# Patient Record
Sex: Male | Born: 1950 | Race: Black or African American | Hispanic: No | Marital: Single | Smoking: Current some day smoker
Health system: Southern US, Community
[De-identification: ages and names within clinical notes are randomized; demographics above are authoritative.]

## PROBLEM LIST (undated history)

## (undated) DIAGNOSIS — I639 Cerebral infarction, unspecified: Secondary | ICD-10-CM

## (undated) DIAGNOSIS — F1020 Alcohol dependence, uncomplicated: Secondary | ICD-10-CM

## (undated) DIAGNOSIS — I739 Peripheral vascular disease, unspecified: Secondary | ICD-10-CM

## (undated) DIAGNOSIS — H269 Unspecified cataract: Secondary | ICD-10-CM

## (undated) DIAGNOSIS — I5032 Chronic diastolic (congestive) heart failure: Secondary | ICD-10-CM

## (undated) DIAGNOSIS — G8191 Hemiplegia, unspecified affecting right dominant side: Secondary | ICD-10-CM

## (undated) DIAGNOSIS — I509 Heart failure, unspecified: Secondary | ICD-10-CM

## (undated) DIAGNOSIS — I6619 Occlusion and stenosis of unspecified anterior cerebral artery: Secondary | ICD-10-CM

## (undated) DIAGNOSIS — R1310 Dysphagia, unspecified: Secondary | ICD-10-CM

## (undated) DIAGNOSIS — F0390 Unspecified dementia without behavioral disturbance: Secondary | ICD-10-CM

## (undated) DIAGNOSIS — N184 Chronic kidney disease, stage 4 (severe): Secondary | ICD-10-CM

## (undated) DIAGNOSIS — F259 Schizoaffective disorder, unspecified: Secondary | ICD-10-CM

## (undated) DIAGNOSIS — E785 Hyperlipidemia, unspecified: Principal | ICD-10-CM

## (undated) DIAGNOSIS — R4189 Other symptoms and signs involving cognitive functions and awareness: Secondary | ICD-10-CM

## (undated) DIAGNOSIS — I6529 Occlusion and stenosis of unspecified carotid artery: Secondary | ICD-10-CM

## (undated) DIAGNOSIS — R630 Anorexia: Secondary | ICD-10-CM

## (undated) DIAGNOSIS — I10 Essential (primary) hypertension: Secondary | ICD-10-CM

## (undated) DIAGNOSIS — R627 Adult failure to thrive: Secondary | ICD-10-CM

## (undated) DIAGNOSIS — F1910 Other psychoactive substance abuse, uncomplicated: Secondary | ICD-10-CM

## (undated) DIAGNOSIS — K922 Gastrointestinal hemorrhage, unspecified: Secondary | ICD-10-CM

## (undated) DIAGNOSIS — G9341 Metabolic encephalopathy: Secondary | ICD-10-CM

## (undated) DIAGNOSIS — F29 Unspecified psychosis not due to a substance or known physiological condition: Secondary | ICD-10-CM

## (undated) DIAGNOSIS — R4182 Altered mental status, unspecified: Secondary | ICD-10-CM

## (undated) HISTORY — DX: Other psychoactive substance abuse, uncomplicated: F19.10

## (undated) HISTORY — DX: Hyperlipidemia, unspecified: E78.5

## (undated) HISTORY — DX: Essential (primary) hypertension: I10

## (undated) HISTORY — DX: Hemiplegia, unspecified affecting right dominant side: G81.91

## (undated) HISTORY — DX: Cerebral infarction, unspecified: I63.9

---

## 2002-09-11 ENCOUNTER — Encounter: Payer: Self-pay | Admitting: Emergency Medicine

## 2002-09-11 ENCOUNTER — Emergency Department (HOSPITAL_COMMUNITY): Admission: EM | Admit: 2002-09-11 | Discharge: 2002-09-12 | Payer: Self-pay | Admitting: Emergency Medicine

## 2003-01-15 ENCOUNTER — Emergency Department (HOSPITAL_COMMUNITY): Admission: EM | Admit: 2003-01-15 | Discharge: 2003-01-15 | Payer: Self-pay | Admitting: Emergency Medicine

## 2006-06-27 ENCOUNTER — Inpatient Hospital Stay (HOSPITAL_COMMUNITY): Admission: EM | Admit: 2006-06-27 | Discharge: 2006-06-28 | Payer: Self-pay | Admitting: Emergency Medicine

## 2006-08-03 ENCOUNTER — Encounter: Admission: RE | Admit: 2006-08-03 | Discharge: 2006-08-03 | Payer: Self-pay | Admitting: Neurosurgery

## 2007-10-31 ENCOUNTER — Emergency Department (HOSPITAL_COMMUNITY): Admission: EM | Admit: 2007-10-31 | Discharge: 2007-10-31 | Payer: Self-pay | Admitting: Family Medicine

## 2009-07-14 ENCOUNTER — Emergency Department (HOSPITAL_COMMUNITY): Admission: EM | Admit: 2009-07-14 | Discharge: 2009-07-15 | Payer: Self-pay | Admitting: Emergency Medicine

## 2010-02-12 ENCOUNTER — Ambulatory Visit (HOSPITAL_COMMUNITY): Admission: RE | Admit: 2010-02-12 | Discharge: 2010-02-12 | Payer: Self-pay | Admitting: Family Medicine

## 2010-02-20 ENCOUNTER — Encounter (INDEPENDENT_AMBULATORY_CARE_PROVIDER_SITE_OTHER): Payer: Self-pay | Admitting: *Deleted

## 2010-02-20 LAB — CONVERTED CEMR LAB
ALT: 11 units/L (ref 0–53)
Albumin: 4.4 g/dL (ref 3.5–5.2)
Basophils Absolute: 0 10*3/uL (ref 0.0–0.1)
CO2: 24 meq/L (ref 19–32)
Chloride: 107 meq/L (ref 96–112)
Cholesterol: 201 mg/dL — ABNORMAL HIGH (ref 0–200)
Eosinophils Relative: 1 % (ref 0–5)
Glucose, Bld: 119 mg/dL — ABNORMAL HIGH (ref 70–99)
HCT: 43.8 % (ref 39.0–52.0)
LDL Cholesterol: 106 mg/dL — ABNORMAL HIGH (ref 0–99)
Lymphocytes Relative: 17 % (ref 12–46)
Lymphs Abs: 1.2 10*3/uL (ref 0.7–4.0)
Neutro Abs: 5.4 10*3/uL (ref 1.7–7.7)
Neutrophils Relative %: 76 % (ref 43–77)
Platelets: 237 10*3/uL (ref 150–400)
Potassium: 4.2 meq/L (ref 3.5–5.3)
RDW: 15 % (ref 11.5–15.5)
Sodium: 141 meq/L (ref 135–145)
Total Bilirubin: 0.5 mg/dL (ref 0.3–1.2)
Total Protein: 6.9 g/dL (ref 6.0–8.3)
Triglycerides: 156 mg/dL — ABNORMAL HIGH (ref <150)
VLDL: 31 mg/dL (ref 0–40)
WBC: 7.1 10*3/uL (ref 4.0–10.5)

## 2010-05-04 ENCOUNTER — Encounter: Payer: Self-pay | Admitting: Neurosurgery

## 2010-07-02 LAB — ETHANOL: Alcohol, Ethyl (B): 345 mg/dL — ABNORMAL HIGH (ref 0–10)
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
## 2010-08-29 NOTE — H&P (Signed)
NAME:  Lucas [name], Lucas [name] NO.:  [id number]   MEDICAL RECORD NO.:  [id number]          PATIENT TYPE:  INP   LOCATION:  [id number]                         FACILITY:  [facility]   PHYSICIAN:  Odis Hollingshead, M.D.DATE OF BIRTH:  February 24, 1951   DATE OF ADMISSION:  06/27/2006  DATE OF DISCHARGE:                              HISTORY & PHYSICAL   ADDENDUM   Review of the CT of the cervical spine, chest, abdomen and pelvis does  not demonstrate any obvious acute trauma.  I have discussed this with  the patient.      Odis Hollingshead, M.D.  Electronically Signed     TJR/MEDQ  D:  06/27/2006  T:  06/27/2006  Job:  [id number]

## 2010-11-12 DIAGNOSIS — I10 Essential (primary) hypertension: Secondary | ICD-10-CM

## 2010-11-12 DIAGNOSIS — I639 Cerebral infarction, unspecified: Secondary | ICD-10-CM

## 2010-11-12 HISTORY — DX: Essential (primary) hypertension: I10

## 2010-11-12 HISTORY — DX: Cerebral infarction, unspecified: I63.9

## 2010-12-09 ENCOUNTER — Emergency Department (HOSPITAL_COMMUNITY): Payer: Medicaid Other

## 2010-12-09 ENCOUNTER — Encounter: Payer: Self-pay | Admitting: Internal Medicine

## 2010-12-09 ENCOUNTER — Inpatient Hospital Stay (HOSPITAL_COMMUNITY)
Admission: EM | Admit: 2010-12-09 | Discharge: 2010-12-20 | DRG: 065 | Disposition: A | Discharge status: Home Health | Payer: Medicaid Other | Attending: Internal Medicine | Admitting: Internal Medicine

## 2010-12-09 DIAGNOSIS — G8190 Hemiplegia, unspecified affecting unspecified side: Secondary | ICD-10-CM | POA: Diagnosis present

## 2010-12-09 DIAGNOSIS — F1410 Cocaine abuse, uncomplicated: Secondary | ICD-10-CM | POA: Diagnosis present

## 2010-12-09 DIAGNOSIS — R471 Dysarthria and anarthria: Secondary | ICD-10-CM | POA: Diagnosis present

## 2010-12-09 DIAGNOSIS — K5900 Constipation, unspecified: Secondary | ICD-10-CM | POA: Diagnosis present

## 2010-12-09 DIAGNOSIS — I6350 Cerebral infarction due to unspecified occlusion or stenosis of unspecified cerebral artery: Principal | ICD-10-CM | POA: Diagnosis present

## 2010-12-09 DIAGNOSIS — Z23 Encounter for immunization: Secondary | ICD-10-CM

## 2010-12-09 DIAGNOSIS — Y998 Other external cause status: Secondary | ICD-10-CM

## 2010-12-09 DIAGNOSIS — F17200 Nicotine dependence, unspecified, uncomplicated: Secondary | ICD-10-CM | POA: Diagnosis present

## 2010-12-09 DIAGNOSIS — E785 Hyperlipidemia, unspecified: Secondary | ICD-10-CM | POA: Diagnosis present

## 2010-12-09 DIAGNOSIS — F1210 Cannabis abuse, uncomplicated: Secondary | ICD-10-CM | POA: Diagnosis present

## 2010-12-09 DIAGNOSIS — I10 Essential (primary) hypertension: Secondary | ICD-10-CM | POA: Diagnosis present

## 2010-12-09 DIAGNOSIS — W19XXXA Unspecified fall, initial encounter: Secondary | ICD-10-CM | POA: Diagnosis present

## 2010-12-09 DIAGNOSIS — R209 Unspecified disturbances of skin sensation: Secondary | ICD-10-CM | POA: Diagnosis present

## 2010-12-09 LAB — DIFFERENTIAL
Basophils Relative: 0 % (ref 0–1)
Eosinophils Absolute: 0.1 10*3/uL (ref 0.0–0.7)
Lymphs Abs: 1.7 10*3/uL (ref 0.7–4.0)
Neutro Abs: 5.9 10*3/uL (ref 1.7–7.7)
Neutrophils Relative %: 71 % (ref 43–77)

## 2010-12-09 LAB — CBC
Hemoglobin: 17 g/dL (ref 13.0–17.0)
Platelets: 226 10*3/uL (ref 150–400)
RBC: 5.66 MIL/uL (ref 4.22–5.81)
WBC: 8.3 10*3/uL (ref 4.0–10.5)

## 2010-12-09 LAB — URINALYSIS, ROUTINE W REFLEX MICROSCOPIC
Leukocytes, UA: NEGATIVE
Nitrite: NEGATIVE
Protein, ur: 100 mg/dL — AB
Urobilinogen, UA: 0.2 mg/dL (ref 0.0–1.0)

## 2010-12-09 LAB — COMPREHENSIVE METABOLIC PANEL
ALT: 15 U/L (ref 0–53)
AST: 22 U/L (ref 0–37)
Alkaline Phosphatase: 91 U/L (ref 39–117)
CO2: 24 mEq/L (ref 19–32)
Chloride: 104 mEq/L (ref 96–112)
GFR calc non Af Amer: >60 mL/min (ref >60)
Glucose, Bld: 141 mg/dL — ABNORMAL HIGH (ref 70–99)
Sodium: 138 mEq/L (ref 135–145)
Total Bilirubin: 0.2 mg/dL — ABNORMAL LOW (ref 0.3–1.2)

## 2010-12-09 LAB — URINE MICROSCOPIC-ADD ON

## 2010-12-09 LAB — RAPID URINE DRUG SCREEN, HOSP PERFORMED
Barbiturates: NOT DETECTED
Cocaine: POSITIVE — AB
Tetrahydrocannabinol: POSITIVE — AB

## 2010-12-09 LAB — BASIC METABOLIC PANEL
Calcium: 9.5 mg/dL (ref 8.4–10.5)
GFR calc Af Amer: >60 mL/min (ref >60)
GFR calc non Af Amer: >60 mL/min (ref >60)
Potassium: 3.6 mEq/L (ref 3.5–5.1)
Sodium: 140 mEq/L (ref 135–145)

## 2010-12-09 LAB — HEMOGLOBIN A1C: Mean Plasma Glucose: 126 mg/dL — ABNORMAL HIGH (ref <117)

## 2010-12-09 LAB — PROTIME-INR: Prothrombin Time: 13.3 seconds (ref 11.6–15.2)

## 2010-12-09 LAB — APTT: aPTT: 39 seconds — ABNORMAL HIGH (ref 24–37)

## 2010-12-09 NOTE — H&P (Signed)
Hospital Admission Note Date: 12/09/2010  Patient name: Lucas [name] Medical record number: [id number] Date of birth: 25-Nov-1950 Age: 60 y.o. Gender: male PCP: No primary provider on file.  Medical Service: Internal Medicine Teaching Service (B2)  Attending physician:  Dr. Larey Dresser Pager: [phone] Resident (R2/R3):  Dr. Lester San Ardo  Pager: [phone] Acting Intern (MS4):  Devoria Glassing   Pager: [phone]  Chief Complaint: Right sided weakness  History of Present Illness: Patient is a 60 year old African American male with no significant past history who presents with 1-day history of right-sided weakness. Patient awoke Monday ~3:00am feeling dizzy. He attempted to get up and noted he was "falling out" and was unable to walk well.  He does not know if he lost consciousness at any point.  He c/o right-sided weakness in arm and leg, difficulty speaking, and unsteady gait. He allowed sister to call [phone] today when he realized he was not getting better.  Pt endorses difficulty initiating urination last PM.   Pt denies chest pain, vision changes, palpitations, dyspnea, abdominal pain, vomiting, vision problems, headache, incontinence.  Pt denies past history of stroke or any similar event as today. Pt endorses smoking crack cocaine on Sunday. Pt does not have a PCP and has not had regular medical care for several years, other than occasional visits to the emergency room for traumatic injuries, including head injuries (no permanent CT changes).   Meds: Pt denies meds  Allergies: NKDA  Social Hx: unemployed, lives with his sister. Unmarried Tobacco use - occasional EtOH use - 1 beer/week Drug use - endorses using crack once (Sunday)  Review of Systems: Pertinent items are noted in HPI.  Physical Exam: Vitals: (08/28 11:04) T 97.8 oral,  BP 164/76, HR 76, RR 25, O2 sat 97%RA  (08/28 14:45)  BP 176/88  HR 77  General Appearance:    Alert, cooperative, no distress, appears stated age    Head:    Normocephalic, without obvious abnormality, atraumatic  Eyes:    PERRL, sclera anicteric, EOM's intact      Ears:    Normal external ear canals, both ears  Throat:   Moist mucus membranes  Neck:   Supple, symmetrical, trachea midline  Lungs:     Clear to auscultation bilaterally, respirations unlabored  Chest wall:    No tenderness or deformity  Heart:    Regular rate and rhythm, S1 and S2 normal, no murmur, rub   or gallop, no carotid  bruit or JVD  Abdomen:     Soft, non-tender, bowel sounds active all four quadrants,    no masses, no organomegaly  Extremities:   Extremities normal, atraumatic, no cyanosis or edema  Pulses:   2+ and symmetric all extremities  Skin:   Skin color, texture, turgor normal, no rashes or lesions  Neurologic: patient awake and alert, oriented to person, place, time, situation; speech mildly slurred but intelligible, PERL, EOMI, equal eyebrow raise and squeezing eyes shut, tongue moves left and right but deviation to left when not actively moving, unable to assess palate elevation due to inability to protrude tongue, mouth droop R side; pronator drift R side, weak grip strength R side, can resist gravity with motion of R hip flexor, quad, dorsiflexion/pronation, but not against added resistance; diminished sensation R arm/leg; hyperreflexia R patella, bicep    Lab results: Basic Metabolic Panel: Recent Labs  [facility] 12/09/10 1455 12/09/10 1125   NA 138 140   K 3.9 3.6   CL 104 102  CO2 24 23   GLUCOSE 141* 115*   BUN 11 12   CREATININE 0.67 0.79   CALCIUM 9.1 9.5   MG -- --   PHOS -- --   Liver Function Tests: Recent Labs  [facility] 12/09/10 1455   AST 22   ALT 15   ALKPHOS 91   BILITOT 0.2*   PROT 7.2   ALBUMIN 3.4*   No results found for this basename: LIPASE:2,AMYLASE:2 in the last 72 hours CBC: Recent Labs  [facility] 12/09/10 1125   WBC 8.3   NEUTROABS 5.9   HGB 17.0   HCT 46.1   MCV 81.4   PLT 226   Urine Drug  Screen: Positive for cocaine, THC Urinalysis: UA significant for trace Hb and 100 mg/dL protein    Imaging results:  Ct Head Wo Contrast  12/09/2010  *RADIOLOGY REPORT*  Clinical Data: Right sided weakness with slurred speech onset yesterday.  High blood pressure.  CT HEAD WITHOUT CONTRAST  Technique:  Contiguous axial images were obtained from the base of the skull through the vertex without contrast.  Comparison: 07/14/2009 [place] CT.  Findings: No intracranial hemorrhage.  Mild white matter type changes.  Hypodensity posterior limb of the left internal capsule minimally more prominent than on prior exam and therefore small acute infarct at this level not entirely excluded.  No intracranial mass lesion detected on this unenhanced exam.  No hydrocephalus.  IMPRESSION: No intracranial hemorrhage.  Mild white matter type changes.  Hypodensity posterior limb of the left internal capsule minimally more prominent than on prior exam and therefore small acute infarct at this level not entirely excluded.  Original Report Authenticated By: Doug Sou, M.D.    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  Because symptom onset was well over 24 hours prior to presentation at ED, patient is outside window for thrombolytics.   1.  Stroke Pt's treatment goals at this point are rehabilitation of current neuro deficits and prevention of future strokes.   - Admit patient to telemetry unit  - NPO until passes swallow screen  - Elevate HOB to >30 degrees  - Neuro checks q2hrs for first 12 hrs, then q4hrs  - HbA1C  - 2D ECHO  - Bilat carotid  dopplers  - AM labs CMET, Fasting lipids  - Consult OT/PT/Speech therapy - Asa 325 mg PO daily -2 view chest Xray  2. Hypertension Pt has been hypertensive since presentation, raising question whether HTN is baseline for pt or is elevated due to stroke. Do not want to aggressively manage HTN within first 24 hours of symptom onset (pt is already outside this window) but pt will likely require anti-hypertensive therapy in the future. Will begin modest anti-hypertensive management tomorrow.  - HCTZ 12.5mg  daily, starting 12/10/10  3.  Proteinuria Pt has proteinuria and trace hemoglobin in urinalysis. May be transient or due to hypertension.  Will require outpatient follow-up, but can establish baseline measurement now.      - Check microalbumin: Cr ratio   4.  Substance abuse Pt endorses using crack cocaine on Sunday for the first time, but denies any  other substance use. U-tox revealed cocaine and THC in his system.  Substance use, especially cocaine, has implications for patient's outpatient management, such as HTN control (cannot use beta blockers).  Pt endorses tobacco use on occasion, but denies need for nicotine patch while admitted.   - Request substance abuse counseling  DVT Px: Lovenox 40mg  SQ   R2/3______________________________      R1________________________________  ATTENDING: I performed and/or observed a history and physical examination of the patient.  I discussed the case with the residents as noted and reviewed the residents' notes.  I agree with the findings and plan--please refer to the attending physician note for more details.  Signature________________________________  Printed Name_____________________________

## 2010-12-10 DIAGNOSIS — I6789 Other cerebrovascular disease: Secondary | ICD-10-CM

## 2010-12-10 LAB — LIPID PANEL
HDL: 53 mg/dL (ref >39)
LDL Cholesterol: 140 mg/dL — ABNORMAL HIGH (ref 0–99)
Total CHOL/HDL Ratio: 4.5 RATIO
Triglycerides: 241 mg/dL — ABNORMAL HIGH (ref <150)

## 2010-12-10 LAB — BASIC METABOLIC PANEL
CO2: 21 mEq/L (ref 19–32)
Chloride: 106 mEq/L (ref 96–112)
GFR calc non Af Amer: >60 mL/min (ref >60)
Glucose, Bld: 109 mg/dL — ABNORMAL HIGH (ref 70–99)
Potassium: 4.3 mEq/L (ref 3.5–5.1)
Sodium: 141 mEq/L (ref 135–145)

## 2010-12-10 LAB — GLUCOSE, CAPILLARY
Glucose-Capillary: 152 mg/dL — ABNORMAL HIGH (ref 70–99)
Glucose-Capillary: 189 mg/dL — ABNORMAL HIGH (ref 70–99)

## 2010-12-10 LAB — CARDIAC PANEL(CRET KIN+CKTOT+MB+TROPI)
CK, MB: 2.7 ng/mL (ref 0.3–4.0)
Troponin I: <0.3 ng/mL (ref <0.30)

## 2010-12-11 LAB — GLUCOSE, CAPILLARY
Glucose-Capillary: 124 mg/dL — ABNORMAL HIGH (ref 70–99)
Glucose-Capillary: 126 mg/dL — ABNORMAL HIGH (ref 70–99)
Glucose-Capillary: 137 mg/dL — ABNORMAL HIGH (ref 70–99)
Glucose-Capillary: 218 mg/dL — ABNORMAL HIGH (ref 70–99)

## 2010-12-11 LAB — BASIC METABOLIC PANEL
Calcium: 9.4 mg/dL (ref 8.4–10.5)
Chloride: 102 mEq/L (ref 96–112)
Creatinine, Ser: 0.77 mg/dL (ref 0.50–1.35)
GFR calc Af Amer: >60 mL/min (ref >60)

## 2010-12-12 DIAGNOSIS — I6350 Cerebral infarction due to unspecified occlusion or stenosis of unspecified cerebral artery: Secondary | ICD-10-CM

## 2010-12-12 DIAGNOSIS — I6330 Cerebral infarction due to thrombosis of unspecified cerebral artery: Secondary | ICD-10-CM

## 2010-12-12 LAB — BASIC METABOLIC PANEL
CO2: 25 mEq/L (ref 19–32)
Chloride: 102 mEq/L (ref 96–112)
Sodium: 137 mEq/L (ref 135–145)

## 2010-12-12 LAB — GLUCOSE, CAPILLARY
Glucose-Capillary: 124 mg/dL — ABNORMAL HIGH (ref 70–99)
Glucose-Capillary: 133 mg/dL — ABNORMAL HIGH (ref 70–99)
Glucose-Capillary: 153 mg/dL — ABNORMAL HIGH (ref 70–99)
Glucose-Capillary: 99 mg/dL (ref 70–99)

## 2010-12-13 DIAGNOSIS — I6350 Cerebral infarction due to unspecified occlusion or stenosis of unspecified cerebral artery: Secondary | ICD-10-CM

## 2010-12-13 LAB — BASIC METABOLIC PANEL
Chloride: 99 mEq/L (ref 96–112)
GFR calc Af Amer: >60 mL/min (ref >60)
Potassium: 4.3 mEq/L (ref 3.5–5.1)

## 2010-12-13 LAB — CBC
HCT: 47.9 % (ref 39.0–52.0)
Platelets: 211 10*3/uL (ref 150–400)
RBC: 5.76 MIL/uL (ref 4.22–5.81)
WBC: 10.4 10*3/uL (ref 4.0–10.5)

## 2010-12-13 LAB — GLUCOSE, CAPILLARY
Glucose-Capillary: 123 mg/dL — ABNORMAL HIGH (ref 70–99)
Glucose-Capillary: 123 mg/dL — ABNORMAL HIGH (ref 70–99)
Glucose-Capillary: 164 mg/dL — ABNORMAL HIGH (ref 70–99)
Glucose-Capillary: 82 mg/dL (ref 70–99)

## 2010-12-14 LAB — GLUCOSE, CAPILLARY
Glucose-Capillary: 131 mg/dL — ABNORMAL HIGH (ref 70–99)
Glucose-Capillary: 138 mg/dL — ABNORMAL HIGH (ref 70–99)
Glucose-Capillary: 140 mg/dL — ABNORMAL HIGH (ref 70–99)

## 2010-12-15 LAB — GLUCOSE, CAPILLARY
Glucose-Capillary: 128 mg/dL — ABNORMAL HIGH (ref 70–99)
Glucose-Capillary: 140 mg/dL — ABNORMAL HIGH (ref 70–99)
Glucose-Capillary: 85 mg/dL (ref 70–99)

## 2010-12-16 LAB — GLUCOSE, CAPILLARY: Glucose-Capillary: 156 mg/dL — ABNORMAL HIGH (ref 70–99)

## 2010-12-17 DIAGNOSIS — I6350 Cerebral infarction due to unspecified occlusion or stenosis of unspecified cerebral artery: Secondary | ICD-10-CM

## 2010-12-17 LAB — GLUCOSE, CAPILLARY
Glucose-Capillary: 166 mg/dL — ABNORMAL HIGH (ref 70–99)
Glucose-Capillary: 169 mg/dL — ABNORMAL HIGH (ref 70–99)

## 2010-12-18 LAB — GLUCOSE, CAPILLARY
Glucose-Capillary: 112 mg/dL — ABNORMAL HIGH (ref 70–99)
Glucose-Capillary: 132 mg/dL — ABNORMAL HIGH (ref 70–99)
Glucose-Capillary: 133 mg/dL — ABNORMAL HIGH (ref 70–99)
Glucose-Capillary: 141 mg/dL — ABNORMAL HIGH (ref 70–99)
Glucose-Capillary: 178 mg/dL — ABNORMAL HIGH (ref 70–99)

## 2010-12-19 LAB — GLUCOSE, CAPILLARY: Glucose-Capillary: 123 mg/dL — ABNORMAL HIGH (ref 70–99)

## 2010-12-20 DIAGNOSIS — I6350 Cerebral infarction due to unspecified occlusion or stenosis of unspecified cerebral artery: Secondary | ICD-10-CM

## 2010-12-20 LAB — GLUCOSE, CAPILLARY
Glucose-Capillary: 120 mg/dL — ABNORMAL HIGH (ref 70–99)
Glucose-Capillary: 146 mg/dL — ABNORMAL HIGH (ref 70–99)

## 2010-12-24 ENCOUNTER — Telehealth: Payer: Self-pay | Admitting: *Deleted

## 2010-12-24 NOTE — Telephone Encounter (Signed)
Adv ot calls to say OT was ordered but after eval it is not appropriate in the home setting, PT will continue at home and when pt has progressed to be able to tolerate OP setting OT will be requested

## 2010-12-24 NOTE — Telephone Encounter (Signed)
Thank you. Did they mention why OT was not appropriate? I am just curious. Pt had CVA and was admitted.

## 2010-12-24 NOTE — Telephone Encounter (Signed)
Just said "in the home environment" i'm sorry i didn't call back and ask for explanation, shall i?

## 2011-01-01 ENCOUNTER — Telehealth: Payer: Self-pay | Admitting: *Deleted

## 2011-01-01 NOTE — Discharge Summary (Signed)
NAME:  Lucas [name], Lucas [name] NO.:  [id number]  MEDICAL RECORD NO.:  [id number]  LOCATION:  3021                         FACILITY:  [facility]  PHYSICIAN:  Larey Dresser, M.D.DATE OF BIRTH:  Dec 15, 1950  DATE OF ADMISSION:  12/09/2010 DATE OF DISCHARGE:  12/16/2010                              DISCHARGE SUMMARY   DISCHARGE DIAGNOSES: 1. Left lacunar stroke, resulting in right-sided weakness and     numbness, mild dysarthria. 2. Hypertension. 3. Hyperlipidemia. 4. Polysubstance abuse (cocaine, tobacco, marijuana).  DISCHARGE MEDICATIONS: 1. Aspirin 81 mg by mouth daily. 2. Pravastatin 80 mg by mouth daily. 3. Hydrochlorothiazide 25 mg daily.  DISPOSITION AND FOLLOWUP:  Mr. Dewan has been placed in [place] for rehabilitation.  PROCEDURES PERFORMED: 1. CT head, noncontrast.  Findings:  No intracranial hemorrhage.  Mild     white matter type changes.  Hypodensity in the posterior limb of     the left internal capsule, minimally more prominent than on prior     exam and therefore small acute infarct at this level, not entirely     excluded. 2. Carotid Dopplers.  Summary:  No significant extracranial carotid     artery stenosis demonstrated.  Vertebrals are patent with antegrade     flow. 3. Transcranial duplex study.  Summary:  Absent bitemporal, right     orbital, and poor occipital windows limit exam.  Normal mean flow     velocities and few identified vessels of anterior and posterior     circulation. 4. 2-D echocardiogram.  Conclusions:     a.     Left ventricle:  The cavity size was normal.  Systolic      function with normal.  The estimated ejection fraction within the      range of 55-60%.  Wall motion was normal.  There were no regional      wall motion abnormalities.     bMaylon Peppers valve:  Trivial regurgitation.     c.     Atrial septum:  No defect or patent foramen ovale was      identified.  ADMITTING HISTORY AND  PHYSICAL:  The patient is a 60 year old male with no significant past history who presents with 1-day history of right- sided weakness.  The patient awoke Monday around 3:00 a.m. feeling dizzy.  He attempted to get up and noticed he was "falling out" and was unable to walk well.  He does not know if he lost consciousness at any point.  He complained of right-sided weakness in arm and leg, difficulty speaking, and unsteady gait.  He allowed sister to call 911 when he realized he was not getting better.  The patient endorses difficulty initiating urination in last p.m..  The patient denies chest pain, vision changes, palpitations, dyspnea, abdominal pain, vomiting, vision problems, headache, or incontinence.  The patient denies past history of stroke or any similar event as today.  The patient endorses smoking crack cocaine on Sunday.  The patient does not have a primary care physician and has not had regular medical care for several years, other than occasional visits to the emergency room for traumatic injuries, including  head injuries (no permanent CT changes).  PHYSICAL EXAMINATION:  VITAL SIGNS:  Temperature 97.8 oral, blood pressure 164/76, heart rate 76, respiratory rate 25, O2 sat 97% on room air. GENERAL APPEARANCE:  Alert, cooperative, no distress, appears stated age. HEAD:  Normocephalic without obvious abnormality, atraumatic. EYES:  Pupils equal, round, and reactive to light.  Sclerae anicteric. Extraocular movement intact. EARS:  Normal external ear canals, both ears. THROAT:  Moist mucous membranes. NECK:  Supple, symmetrical, trachea midline. LUNGS:  Clear to auscultation bilaterally.  Respirations unlabored. CHEST WALL:  No tenderness or deformities. HEART:  Regular rate and rhythm.  S1-S2 normal.  No murmur, rub, or gallop.  No carotid bruit or JVD. ABDOMEN:  Soft and nontender.  Bowel sounds active in all 4 quadrants. No masses, no organomegaly. EXTREMITIES:   Normal, atraumatic.  No cyanosis or edema.  Pulses 2+ and symmetric in all extremities. SKIN:  Skin color, texture, and turgor normal.  No rashes or lesions. NEUROLOGIC:  The patient awake, alert, and oriented to person, place, time, and situation.  Speech mildly slurred but intelligible.  Pupils equal and reactive to light.  Extraocular motions intact.  Equal eyebrow raise and squeezing of eyes shut.  Tongue moved left and right.  Unable to assess palate elevation due to inability to protrude tongue.  Mouth droop, right side.  Pronator drift, right side.  Weak grip strength, right side.  Can resist gravity with motion of right hip flexor, quadriceps, dorsiflexion and pronation but not against added resistance. Diminished sensation, right arm and leg.  Hyperreflexia, right and biceps.  ADMITTING LABORATORY DATA:  Sodium 138, potassium 3.9, chloride 104, CO2 24, glucose 141, BUN 11,  creatinine 0.67, calcium 9.1.  AST 22, ALT 15, alkaline phosphatase 91, total bili 0.2, protein 7.2, and albumin 3.4. White blood cell count 8.3, hemoglobin 17.0, hematocrit 46.1, and platelets 226.  Urine drug screen positive for cocaine and THC. Urinalysis significant for trace hemoglobin and 100 mg/dL of protein.  EKG:  Normal sinus rhythm at a rate of 71 beats per minute, normal axis, narrow QRS, nonspecific ST-segment and T-wave changes.  Admitting chest x-ray:  No evidence of acute cardiopulmonary disease, chronic interstitial markings.  HOSPITAL COURSE: 1. Stroke.  The patient presented with right-sided motor and sensory     deficits, consistent with sensorimotor lacunar infarct (NIHSS 8     score) and supported by head CT findings.  Because the patient     presented over 24 hours after onset, he was not a candidate for     thrombolytics.  Transcranial Doppler, carotid Dopplers, and 2-D     echo did not implicate any main arteries or source of an embolism,     other transcranial Doppler was  limited to few vessels of anterior     and posterior circulation.  MRI was not performed since it would     not alter his treatment course.  Goals for the patient's management     included rehabilitation of current symptoms and prevention of     future strokes.  The patient was started on daily aspirin at 325 mg     for the first 2 days and subsequently reduced to 81 mg daily     aspirin.  The patient was consistently hypertensive during     hospitalization.  He was given one dose of hydralazine for a blood     pressure spike of 123456 systolic the first afternoon of admission.     The patient  was not treated with beta-blockers at any point given     his cocaine-positive urine drug screen.  On hospital day #2, the     patient was started on a statin for hyperlipidemia and a modest     dose of hydrochlorothiazide 12.5 mg, which was later increased to     25 mg daily.  Speech, PT, and OT were consulted, with a final     consent since the patient would benefit from [facility], or [facility] [place] [facility] if inpatient was unavailable.     [facility] evaluated and determined the patient     was already packed with the functional goals of CIR and recommended     Home Health and outpatient occupational therapy and physical     therapy.  However, home was not appropriate for the patient given     no person would be present to provide 24/7 assistance.  Skilled     nursing facility placement was pursued instead. 2. Hypertension.  Question whether the patient's hypertension was     secondary to stroke or to chronic hypertension.  The patient was     treated with hydralazine for one blood pressure spike shortly after     admission.  An increased blood pressure up to [id number] systolic was     tolerated during PT and OT sessions due to the patient having     transient elevations related to exertion.  The patient was placed     on antihypertensive hydrochlorothiazide and  subsequently had better     blood pressure control.  He will require outpatient management of     hypertension. 3. Hyperlipidemia.  The patient's fasting lipid panel revealed     elevated total cholesterol of 241 and LDL of 130.  The patient was     started on a statin as above. 4. Substance abuse.  The patient's substance use manifested as     positive cocaine on urine drug screen.  Substance abuse counseling     was requested, especially in light that cocaine use may contribute     to increased risk of stroke and also effects while hypertension     medications the patient may use.  The patient encouraged to     discharge to completely abstain from drugs and tobacco. 5. Proteinuria.  Incidental finding on urinalysis in the emergency     department was proteinuria, which could be transient or a     consequence of chronic hypertension.  We will recommend further     monitoring of kidney function on an outpatient basis. 6. Pneumococcal vaccination.  The patient received a pneumococcal     vaccination during this hospital admission.  DISCHARGE VITAL SIGNS:  Temperature 98.5, pulse 71, respirations 18, blood pressure 146/77, and oxygen saturation 97% on room air.  DISCHARGE LABORATORY DATA:  Sodium 137, potassium 4.3, chloride 99, CO2 26, glucose 116, BUN 18, creatinine 0.85, and calcium 10.1.  WBC 10.4, hemoglobin 16.3, hematocrit 47.9, and platelet count 211.    ______________________________ Julius Bowels, MD   ______________________________ Larey Dresser, M.D.   Devoria Glassing, MS-IV dictating for Drs. Julius Bowels and Larey Dresser. MH/MEDQ  D:  12/16/2010  T:  12/16/2010  Job:  [id number]  Electronically Signed by Julius Bowels MD on 12/27/2010 01:13:26 PM Electronically Signed by Larey Dresser M.D. on 01/01/2011 10:12:04 AM

## 2011-01-01 NOTE — Telephone Encounter (Signed)
He has appt with Dr Owens Shark coming up and BP can be addressed then. Thanks

## 2011-01-01 NOTE — Telephone Encounter (Signed)
What did she mean by other drugs? Illicit? OTC NSAIDS? Thanks

## 2011-01-01 NOTE — Discharge Summary (Signed)
  NAME:  Lucas, [name] NO.:  [id number]  MEDICAL RECORD NO.:  [id number]  LOCATION:  3021                         FACILITY:  [facility]  PHYSICIAN:  Larey Dresser, M.D.DATE OF BIRTH:  [birth year]  DATE OF ADMISSION:  12/09/2010 DATE OF DISCHARGE:  12/20/2010                              DISCHARGE SUMMARY   ADDENDUM  This is an addendum to job number [id number] discharge summary.  ADDENDUM TO DISPOSITION AND FOLLOWUP:  Discharge to [facility] did not work out.  The skilled nursing facility was unable to accept [name] and no other skilled nursing facility placement could be arranged.  In the meantime, the patient made great progress with his inpatient physical therapy and occupational therapy and had progressed adequately for discharge home with home health PT and OT.  Discharged to home with live with his sister with home health PT and OT.  The patient will follow up on January 06, 2011, at 3 p.m. with Dr. Owens Shark in the [facility].  Please assess blood pressure and adequacy of current antihypertensive regiment.  Please also assess for medication compliance and the patient's progress with rehabilitation.  ADDENDUM TO DISCHARGE MEDICATIONS:  The patient was not discharged on hydrochlorothiazide 25 mg daily.  The patient was discharged on lisinopril and hydrochlorothiazide 10/12.5 p.o. daily.  ADDENDUM TO HOSPITAL COURSE:  Since dictation, the patient has remained stable and has improved greatly in his strength on the right side.  The patient's antihypertensive therapy was adjusted due to continued high blood pressure readings.  He was placed on lisinopril and hydrochlorothiazide 10/2.5 p.o. daily and tolerated it well.  ADDENDUM TO DISCHARGE DAY VITAL SIGNS:  Temperature 98.5, heart rate 77, respiratory rate 16, blood pressure 149/76, and O2 saturation 100% on room air.    ______________________________ Julius Bowels,  MD   ______________________________ Larey Dresser, M.D.    MH/MEDQ  D:  12/21/2010  T:  12/21/2010  Job:  [id number]  Electronically Signed by Julius Bowels MD on 12/27/2010 01:14:06 PM Electronically Signed by Larey Dresser M.D. on 01/01/2011 10:12:18 AM

## 2011-01-01 NOTE — Telephone Encounter (Signed)
Call from Staley PT from [facility] said that she is ready to discharge pt today from Home PT.  Feels pt is ready for OP PT.  Said that pt's B/P today is 162/106.  Said that pt said that he has headaches everyday.  Claiborne Billings thinks there may be other drugs that may be interfering with the B/P as well.

## 2011-01-06 ENCOUNTER — Encounter: Payer: Self-pay | Admitting: Internal Medicine

## 2011-01-06 ENCOUNTER — Ambulatory Visit (INDEPENDENT_AMBULATORY_CARE_PROVIDER_SITE_OTHER): Payer: Medicaid Other | Admitting: Internal Medicine

## 2011-01-06 VITALS — BP 152/88 | HR 66 | Temp 97.7°F | Ht 65.0 in | Wt 147.6 lb

## 2011-01-06 DIAGNOSIS — I639 Cerebral infarction, unspecified: Secondary | ICD-10-CM | POA: Insufficient documentation

## 2011-01-06 DIAGNOSIS — E785 Hyperlipidemia, unspecified: Secondary | ICD-10-CM

## 2011-01-06 DIAGNOSIS — F1910 Other psychoactive substance abuse, uncomplicated: Secondary | ICD-10-CM

## 2011-01-06 DIAGNOSIS — Z720 Tobacco use: Secondary | ICD-10-CM

## 2011-01-06 DIAGNOSIS — Z0000 Encounter for general adult medical examination without abnormal findings: Secondary | ICD-10-CM

## 2011-01-06 DIAGNOSIS — F17200 Nicotine dependence, unspecified, uncomplicated: Secondary | ICD-10-CM

## 2011-01-06 DIAGNOSIS — I10 Essential (primary) hypertension: Secondary | ICD-10-CM | POA: Insufficient documentation

## 2011-01-06 DIAGNOSIS — I6350 Cerebral infarction due to unspecified occlusion or stenosis of unspecified cerebral artery: Secondary | ICD-10-CM

## 2011-01-06 HISTORY — DX: Other psychoactive substance abuse, uncomplicated: F19.10

## 2011-01-06 HISTORY — DX: Hyperlipidemia, unspecified: E78.5

## 2011-01-06 MED ORDER — ONE-DAILY MULTI VITAMINS PO TABS: 1.0000 | ORAL_TABLET | Freq: Every day | ORAL | Status: DC

## 2011-01-06 MED ORDER — LISINOPRIL-HYDROCHLOROTHIAZIDE 20-25 MG PO TABS: 1.0000 | ORAL_TABLET | Freq: Every day | ORAL | Status: DC

## 2011-01-06 MED ORDER — DOCUSATE SODIUM 100 MG PO CAPS: 100.0000 mg | ORAL_CAPSULE | Freq: Two times a day (BID) | ORAL | Status: DC | PRN

## 2011-01-06 NOTE — Assessment & Plan Note (Addendum)
Patient started on lisinopril-HCTZ 10-12.5 mg at hospital discharge, though still with elevated BP -increasing to lisinopril-HCTZ 20-25 mg, may need to add additional agents in the future -patient to return in 1 month to re-evaluate BP

## 2011-01-06 NOTE — Progress Notes (Signed)
HPI The patient is a 60 yo man, presenting for a hospital follow-up for an acute stroke.  The patient was admitted 8/28 with a L lacunar stroke, resulting in right-sided weakness, numbness, and dysarthria.  Since discharge, the patient notes significant improvement in symptoms with physical therapy, which he has now completed.  He notes no new neurologic symptoms since discharge, but still notes some unsteadiness when walking, particularly in his right knee, and he has been walking with a cane.  He notes no pain in his right knee, but notes that it does not seem as stable as it was before his stroke.  The patient has continued to smoke since discharge, currently smoking 3-4 cigarettes/day, though he admits that he knows he needs to quit.  He notes that he has quit using cocaine since hospital discharge, and no longer uses any illicit substances.  He notes that he still drinks one drink of beer/day.  The patient notes that he has been taking his medications as prescribed, though he did not bring his medications today.  He notes that his Physical Therapists have noted that he has had elevated blood pressure during some of his sessions, and BP today is 152/88.  ROS: General: no fevers, chills, changes in weight, changes in appetite Skin: no rash HEENT: +mild dysarthria, no blurry vision, hearing changes, sore throat Pulm: no dyspnea, coughing, wheezing CV: no chest pain, palpitations, shortness of breath Abd: no abdominal pain, nausea/vomiting, diarrhea/constipation GU: no dysuria, hematuria, polyuria Neuro: see HPI  Filed Vitals:   01/06/11 1527  BP: 152/88  Pulse: 66  Temp: 97.7 F (36.5 C)    PEX General: alert, cooperative, and in no apparent distress HEENT: pupils equal round and reactive to light, vision grossly intact, oropharynx clear and non-erythematous, mild dysarthria noted Neck: supple, no lymphadenopathy, JVD, or carotid bruits Lungs: clear to ascultation bilaterally, normal  work of respiration, no wheezes, rales, ronchi Heart: regular rate and rhythm, no murmurs, gallops, or rubs Abdomen: soft, non-tender, non-distended, normal bowel sounds Extremities: no cyanosis, clubbing, or edema, R knee non-tender to palpation with full painless ROM Neurologic: alert & oriented X3, minimal right facial droop noted, otherwise cranial nerves II-XII intact, strength R shoulder abduction 5/5, R elbow extension 4/5, R elbow flexion 4/5, R wrist extension 4/5, R wrist flexion 4/5, R hip flexion 4/5, R knee extension 4/5, R foot dorsiflexion 5/5, R foot plantarflexion 5/5, otherwise strength 5/5, sensation intact to pinprick throughout  Assessment/Plan

## 2011-01-06 NOTE — Assessment & Plan Note (Signed)
Pravastatin 80 started during hospitalization -checking cmp today

## 2011-01-06 NOTE — Assessment & Plan Note (Signed)
-  pneumovax received in-hospital -patient reports flu shot also received in-hospital -patient not amenable to FOBT or colonoscopy at this time, but agrees to discuss at future visits

## 2011-01-06 NOTE — Assessment & Plan Note (Signed)
Symptoms significantly improving since L lacunar stroke 11/2010, with completion of PT regimen -continue to manage risk factors (see below)

## 2011-01-06 NOTE — Patient Instructions (Signed)
For your blood pressure, we are increasing your Lisinopril-Hydrochlorothiazide to a dose of 20-25 mg.  I've written you a prescription for this new pill, which will be 1 tablet per day (which is equal to 2 of the tablets you currently have in your prescription bottle).  Stopping smoking is the best thing you can do for your health right now, and we have resources we can give you to help you quit.  Please return for a follow-up visit in 1 month, and we will check your blood pressure.

## 2011-01-06 NOTE — Assessment & Plan Note (Signed)
Patient currently smokes 3-4 cigs/day, interested in quitting -patient wants to try quitting tobacco on his own, before accepting our help in the form of nicotine patch, gum, or Social Work referral -will follow-up at next visit

## 2011-01-07 LAB — COMPLETE METABOLIC PANEL WITH GFR
ALT: 9 U/L (ref 0–53)
AST: 14 U/L (ref 0–37)
Alkaline Phosphatase: 66 U/L (ref 39–117)
GFR, Est Non African American: >60 mL/min (ref >60)
Sodium: 141 mEq/L (ref 135–145)
Total Bilirubin: 0.4 mg/dL (ref 0.3–1.2)
Total Protein: 7.6 g/dL (ref 6.0–8.3)

## 2011-01-07 NOTE — Progress Notes (Signed)
I discussed Mr Degenova with Dr Owens Shark and agree with his assessment and plan. I knew Mr Yoho from his inpt stay.

## 2011-01-09 LAB — CULTURE, ROUTINE-ABSCESS

## 2011-02-03 ENCOUNTER — Ambulatory Visit: Payer: Self-pay | Admitting: Internal Medicine

## 2011-02-06 ENCOUNTER — Encounter: Payer: Self-pay | Admitting: Internal Medicine

## 2011-02-06 ENCOUNTER — Ambulatory Visit (INDEPENDENT_AMBULATORY_CARE_PROVIDER_SITE_OTHER): Payer: Medicaid Other | Admitting: Internal Medicine

## 2011-02-06 VITALS — BP 164/83 | HR 59 | Temp 97.8°F | Ht 65.0 in | Wt 148.8 lb

## 2011-02-06 DIAGNOSIS — I509 Heart failure, unspecified: Secondary | ICD-10-CM

## 2011-02-06 DIAGNOSIS — Z91120 Patient's intentional underdosing of medication regimen due to financial hardship: Secondary | ICD-10-CM

## 2011-02-06 DIAGNOSIS — F17200 Nicotine dependence, unspecified, uncomplicated: Secondary | ICD-10-CM

## 2011-02-06 DIAGNOSIS — I10 Essential (primary) hypertension: Secondary | ICD-10-CM

## 2011-02-06 NOTE — Patient Instructions (Signed)
Please, go to [facility] and apply for a MAP program. Please, start taking your blood pressure medication ASAP. Please, make an appointment with Volanda Napoleon for smoking cessation counseling and other financial concerns that you may have. Please, follow up with [place] in 2 weeks and call with any questions.

## 2011-02-06 NOTE — Progress Notes (Deleted)
  Subjective:    Patient ID: Lucas [name], male    DOB: 1951-01-06, 60 y.o.   MRN: [id number]  HPI    Review of Systems     Objective:   Physical Exam        Assessment & Plan:

## 2011-02-06 NOTE — Progress Notes (Signed)
Subjective:   Patient ID: Lucas [name] male   DOB: Jan 28, 1951 60 y.o.   MRN: [id number]  HPI: [name] is a 60 y.o. man who was admitted 8/28 with a L lacunar stroke, resulting in right-sided weakness, numbness, and dysarthria. He completed physical therapy course and reports no new neurologic symptoms since discharge, but still has to use a  walkingcane.  Patient is not any of his medications "because can't afford them."    No past medical history on file. Current Outpatient Prescriptions  Medication Sig Dispense Refill  . aspirin 81 MG tablet Take 81 mg by mouth daily.        Marland Kitchen docusate sodium (COLACE) 100 MG capsule Take 100 mg by mouth 2 (two) times daily as needed.        Marland Kitchen lisinopril-hydrochlorothiazide (PRINZIDE,ZESTORETIC) 20-25 MG per tablet Take 1 tablet by mouth daily.  30 tablet  5  . Multiple Vitamin (MULTIVITAMIN) tablet Take 1 tablet by mouth daily.  30 tablet  11  . pravastatin (PRAVACHOL) 80 MG tablet Take 80 mg by mouth daily.        Marland Kitchen DISCONTD: docusate sodium (COLACE) 100 MG capsule Take 1 capsule (100 mg total) by mouth 2 (two) times daily as needed for constipation.  30 capsule  1   No family history on file. History   Social History  . Marital Status: Single    Spouse Name: N/A    Number of Children: N/A  . Years of Education: N/A   Social History Main Topics  . Smoking status: Current Some Day Smoker -- 0.2 packs/day for 40 years    Types: Cigarettes  . Smokeless tobacco: None  . Alcohol Use: 0.0 oz/week  . Drug Use: No  . Sexually Active: None   Other Topics Concern  . None   Social History Narrative  . None   Review of Systems: Constitutional: Denies fever, chills, diaphoresis, appetite change and fatigue.  HEENT: Denies photophobia, eye pain, redness, hearing loss, ear pain, congestion, sore throat, rhinorrhea, sneezing, mouth sores, trouble swallowing, neck pain, neck stiffness and tinnitus.   Respiratory: Denies SOB, DOE, cough,  chest tightness,  and wheezing.   Cardiovascular: Denies chest pain, palpitations and leg swelling.  Gastrointestinal: Denies nausea, vomiting, abdominal pain, diarrhea, constipation, blood in stool and abdominal distention.  Genitourinary: Denies dysuria, urgency, frequency, hematuria, flank pain and difficulty urinating.  Musculoskeletal: Denies myalgias, back pain, joint swelling, arthralgias and gait problem.  Skin: Denies pallor, rash and wound.  Neurological: Denies dizziness, seizures, syncope, weakness, light-headedness, numbness and headaches.  Hematological: Denies adenopathy. Easy bruising, personal or family bleeding history  Psychiatric/Behavioral: Denies suicidal ideation, mood changes, confusion, nervousness, sleep disturbance and agitation  Objective:  Physical Exam: There were no vitals filed for this visit. Constitutional: Vital signs reviewed.  Patient is a well-developed and well-nourished  in no acute distress and cooperative with exam. Alert and oriented x3.  Head: Normocephalic and atraumatic Ear: TM normal bilaterally Mouth: no erythema or exudates, MMM Eyes: PERRL, EOMI, conjunctivae normal, No scleral icterus.  Neck: Supple, Trachea midline normal ROM, No JVD, mass, thyromegaly, or carotid bruit present.  Cardiovascular: RRR, S1 normal, S2 normal, no MRG, pulses symmetric and intact bilaterally Pulmonary/Chest: CTAB, no wheezes, rales, or rhonchi Abdominal: Soft. Non-tender, non-distended, bowel sounds are normal, no masses, organomegaly, or guarding present.  GU: no CVA tenderness Musculoskeletal: No joint deformities, erythema, or stiffness, ROM full and no nontender Hematology: no cervical, inginal, or axillary adenopathy.  Neurological: A&O x3, Strenght is normal and symmetric bilaterally, cranial nerve II-XII are grossly intact, no focal motor deficit, sensory intact to light touch bilaterally.  Skin: Warm, dry and intact. No rash, cyanosis, or clubbing.    Psychiatric: Normal mood and affect. speech and behavior is normal. Judgment and thought content normal. Cognition and memory are normal.   Assessment & Plan:   1.  Sp lacunar stroke with mil residual right LE weakness. -Does not take ASA -"can't afford it" -referred to [facility] at [facility] -STOP smoking!!!  2. HTN, uncontrolled due to the patient's inability to purchase his medications -Given 7$ to purchase his anti-HTN med ASAP -referred to MAP at [facility] department  3. Smoker -Smoking cessation strongly advised -referred to see Volanda Napoleon (SW) for counseling

## 2011-02-12 ENCOUNTER — Telehealth: Payer: Self-pay | Admitting: Licensed Clinical Social Worker

## 2011-02-12 NOTE — Telephone Encounter (Signed)
Called patient in response to smoking cessation referral from Dr. Stanford Scotland.   He reported smoking about 6 cigarettes daily and has smoked for many years.  Patient said he would like to quit but doesn't have a quit date in mind.  He was not willing to schedule a separate appmt to see me outside of his Nov. 9th appmt at 3:15 PM w/ Dr. Cathren Laine.  Advised patient of the various resources available to him to quit including 1-800 quit line and also [place] group programs.  Patient asked that I leave information for him which I agreed to do and leave w/ nurse for Nov. 9th appmt.

## 2011-02-20 ENCOUNTER — Ambulatory Visit (INDEPENDENT_AMBULATORY_CARE_PROVIDER_SITE_OTHER): Payer: Medicaid Other | Admitting: Internal Medicine

## 2011-02-20 ENCOUNTER — Encounter: Payer: Self-pay | Admitting: Internal Medicine

## 2011-02-20 DIAGNOSIS — M25511 Pain in right shoulder: Secondary | ICD-10-CM

## 2011-02-20 DIAGNOSIS — Z0000 Encounter for general adult medical examination without abnormal findings: Secondary | ICD-10-CM

## 2011-02-20 DIAGNOSIS — I10 Essential (primary) hypertension: Secondary | ICD-10-CM

## 2011-02-20 DIAGNOSIS — E785 Hyperlipidemia, unspecified: Secondary | ICD-10-CM

## 2011-02-20 DIAGNOSIS — I6350 Cerebral infarction due to unspecified occlusion or stenosis of unspecified cerebral artery: Secondary | ICD-10-CM

## 2011-02-20 DIAGNOSIS — F1910 Other psychoactive substance abuse, uncomplicated: Secondary | ICD-10-CM

## 2011-02-20 DIAGNOSIS — I639 Cerebral infarction, unspecified: Secondary | ICD-10-CM

## 2011-02-20 NOTE — Assessment & Plan Note (Signed)
Patient was counseled extensively regarding smoking and was given instruction given by Butch Penny. Follow up in 1 week.

## 2011-02-20 NOTE — Assessment & Plan Note (Signed)
Pravastatin 80 is much more expensive then 40mg  without much added benefit. I will switch this to Pravastatin 40 at next refill request.

## 2011-02-20 NOTE — Progress Notes (Signed)
  Subjective:    Patient ID: Lucas [name], male    DOB: 1950-07-27, 60 y.o.   MRN: [id number]  HPI  Mr. Demichael is here today for a follow up for his BP. He is complaining of shoulder pain which is 7/10. He says that pain is getting worse. He is able to move his right arm well in all directions but limited by pain. Patient has regained about 75% of the function back. He still complains of occasional headaches, blurred vision, slurring of speech and weakness in right arm.  Patient is out of work and is unable to find a job at this time. He is struggling to meet his ends.   He continues to smoke 5-6 cigarettes a day. He wants to quit smoking and had talked to Butch Penny earlier this week who had left him material to read to help quit smoking.  Review of Systems  Constitutional: Negative for fever, activity change and appetite change.  HENT: Negative for sore throat.   Eyes: Positive for visual disturbance.  Respiratory: Negative for cough and shortness of breath.   Cardiovascular: Negative for chest pain and leg swelling.  Gastrointestinal: Negative for nausea, abdominal pain, diarrhea, constipation and abdominal distention.  Genitourinary: Negative for frequency, hematuria and difficulty urinating.  Musculoskeletal: Positive for arthralgias.  Neurological: Positive for speech difficulty, weakness, numbness and headaches. Negative for dizziness.  Psychiatric/Behavioral: Negative for suicidal ideas and behavioral problems.       Objective:   Physical Exam  Constitutional: He is oriented to person, place, and time. He appears well-developed and well-nourished.  HENT:  Head: Normocephalic and atraumatic.  Eyes: Conjunctivae and EOM are normal. Pupils are equal, round, and reactive to light. No scleral icterus.  Neck: Normal range of motion. Neck supple. No JVD present. No thyromegaly present.  Cardiovascular: Normal rate, regular rhythm, normal heart sounds and intact distal pulses.  Exam  reveals no gallop and no friction rub.   No murmur heard. Pulmonary/Chest: Effort normal and breath sounds normal. No respiratory distress. He has no wheezes. He has no rales.  Abdominal: Soft. Bowel sounds are normal. He exhibits no distension and no mass. There is no tenderness. There is no rebound and no guarding.  Musculoskeletal: Normal range of motion. He exhibits no edema and no tenderness.       Right shoulder: He exhibits tenderness, deformity, pain and decreased strength. He exhibits normal range of motion, no bony tenderness, no swelling, no effusion, no crepitus, no laceration, no spasm and normal pulse.  Lymphadenopathy:    He has no cervical adenopathy.  Neurological: He is alert and oriented to person, place, and time. He has normal reflexes. He displays atrophy. No cranial nerve deficit or sensory deficit. He exhibits abnormal muscle tone. Coordination and gait normal. GCS eye subscore is 4. GCS verbal subscore is 5. GCS motor subscore is 6.       Patient's right arm is weak, tone is decreased and strength is 4/5 which is slightly less then the other side.  Psychiatric: He has a normal mood and affect. His behavior is normal.          Assessment & Plan:

## 2011-02-20 NOTE — Assessment & Plan Note (Addendum)
Patient is making progress in term of regaining his function, I feel that another round of PT will be really helpful for him. He is still working to get his orange card and does not have transportation to get to rehab place. I have asked him to come in and get his orange card made as soon as possible and that way we can move forward and refer him to PT. He will also need some financial help from [place] [employer] for transportation.

## 2011-02-20 NOTE — Patient Instructions (Signed)
Shoulder Exercises EXERCISES  RANGE OF MOTION (ROM) AND STRETCHING EXERCISES These exercises may help you when beginning to rehabilitate your injury. Your symptoms may resolve with or without further involvement from your physician, physical therapist or athletic trainer. While completing these exercises, remember:   Restoring tissue flexibility helps normal motion to return to the joints. This allows healthier, less painful movement and activity.   An effective stretch should be held for at least 30 seconds.   A stretch should never be painful. You should only feel a gentle lengthening or release in the stretched tissue.  ROM - Pendulum  Bend at the waist so that your right / left arm falls away from your body. Support yourself with your opposite hand on a solid surface, such as a table or a countertop.   Your right / left arm should be perpendicular to the ground. If it is not perpendicular, you need to lean over farther. Relax the muscles in your right / left arm and shoulder as much as possible.   Gently sway your hips and trunk so they move your right / left arm without any use of your right / left shoulder muscles.   Progress your movements so that your right / left arm moves side to side, then forward and backward, and finally, both clockwise and counterclockwise.   Complete __________ repetitions in each direction. Many people use this exercise to relieve discomfort in their shoulder as well as to gain range of motion.  Repeat __________ times. Complete this exercise __________ times per day. STRETCH - Flexion, Standing  Stand with good posture. With an underhand grip on your right / left hand and an overhand grip on the opposite hand, grasp a broomstick or cane so that your hands are a little more than shoulder-width apart.   Keeping your right / left elbow straight and shoulder muscles relaxed, push the stick with your opposite hand to raise your right / left arm in front of your  body and then overhead. Raise your arm until you feel a stretch in your right / left shoulder, but before you have increased shoulder pain.   Try to avoid shrugging your right / left shoulder as your arm rises by keeping your shoulder blade tucked down and toward your mid-back spine. Hold __________ seconds.   Slowly return to the starting position.  Repeat __________ times. Complete this exercise __________ times per day. STRETCH - Internal Rotation  Place your right / left hand behind your back, palm-up.   Throw a towel or belt over your opposite shoulder. Grasp the towel/belt with your right / left hand.   While keeping an upright posture, gently pull up on the towel/belt until you feel a stretch in the front of your right / left shoulder.   Avoid shrugging your right / left shoulder as your arm rises by keeping your shoulder blade tucked down and toward your mid-back spine.   Hold __________. Release the stretch by lowering your opposite hand.  Repeat _______10___ times. Complete this exercise ___4_______ times per day. STRETCH - External Rotation and Abduction  Stagger your stance through a doorframe. It does not matter which foot is forward.   As instructed by your physician, physical therapist or athletic trainer, place your hands:   And forearms above your head and on the door frame.   And forearms at head-height and on the door frame.   At elbow-height and on the door frame.   Keeping your head and chest  upright and your stomach muscles tight to prevent over-extending your low-back, slowly shift your weight onto your front foot until you feel a stretch across your chest and/or in the front of your shoulders.   Hold _____15_____ seconds. Shift your weight to your back foot to release the stretch.  Repeat ____10______ times. Complete this stretch _____4_____ times per day.  STRENGTHENING EXERCISES  These exercises may help you when beginning to rehabilitate your injury. They  may resolve your symptoms with or without further involvement from your physician, physical therapist or athletic trainer. While completing these exercises, remember:   Muscles can gain both the endurance and the strength needed for everyday activities through controlled exercises.   Complete these exercises as instructed by your physician, physical therapist or athletic trainer. Progress the resistance and repetitions only as guided.   You may experience muscle soreness or fatigue, but the pain or discomfort you are trying to eliminate should never worsen during these exercises. If this pain does worsen, stop and make certain you are following the directions exactly. If the pain is still present after adjustments, discontinue the exercise until you can discuss the trouble with your clinician.   If advised by your physician, during your recovery, avoid activity or exercises which involve actions that place your right / left hand or elbow above your head or behind your back or head. These positions stress the tissues which are trying to heal.  STRENGTH - Scapular Depression and Adduction  With good posture, sit on a firm chair. Supported your arms in front of you with pillows, arm rests or a table top. Have your elbows in line with the sides of your body.   Gently draw your shoulder blades down and toward your mid-back spine. Gradually increase the tension without tensing the muscles along the top of your shoulders and the back of your neck.   Hold for ____15______ seconds. Slowly release the tension and relax your muscles completely before completing the next repetition.   After you have practiced this exercise, remove the arm support and complete it in standing as well as sitting.  Repeat _____10_____ times. Complete this exercise _____4_____ times per day.  STRENGTH - External Rotators  Secure a rubber exercise band/tubing to a fixed object so that it is at the same height as your right / left  elbow when you are standing or sitting on a firm surface.   Stand or sit so that the secured exercise band/tubing is at your side that is not injured.   Bend your elbow 90 degrees. Place a folded towel or small pillow under your right / left arm so that your elbow is a few inches away from your side.   Keeping the tension on the exercise band/tubing, pull it away from your body, as if pivoting on your elbow. Be sure to keep your body steady so that the movement is only coming from your shoulder rotating.   Hold _____15_____ seconds. Release the tension in a controlled manner as you return to the starting position.  Repeat ____10______ times. Complete this exercise ____4______ times per day.  STRENGTH - Supraspinatus  Stand or sit with good posture. Grasp a ___15 pound_______ weight or an exercise band/tubing so that your hand is "thumbs-up," like when you shake hands.   Slowly lift your right / left hand from your thigh into the air, traveling about 30 degrees from straight out at your side. Lift your hand to shoulder height or as far as you  can without increasing any shoulder pain. Initially, many people do not lift their hands above shoulder height.   Avoid shrugging your right / left shoulder as your arm rises by keeping your shoulder blade tucked down and toward your mid-back spine.   Hold for ___15____ seconds. Control the descent of your hand as you slowly return to your starting position.  Repeat ____10______ times. Complete this exercise ___4_______ times per day.  STRENGTH - Shoulder Extensors  Secure a rubber exercise band/tubing so that it is at the height of your shoulders when you are either standing or sitting on a firm arm-less chair.   With a thumbs-up grip, grasp an end of the band/tubing in each hand. Straighten your elbows and lift your hands straight in front of you at shoulder height. Step back away from the secured end of band/tubing until it becomes tense.   Squeezing  your shoulder blades together, pull your hands down to the sides of your thighs. Do not allow your hands to go behind you.   Hold for _____15_____ seconds. Slowly ease the tension on the band/tubing as you reverse the directions and return to the starting position.  Repeat ______10____ times. Complete this exercise ___4_______ times per day.  STRENGTH - Scapular Retractors  Secure a rubber exercise band/tubing so that it is at the height of your shoulders when you are either standing or sitting on a firm arm-less chair.   With a palm-down grip, grasp an end of the band/tubing in each hand. Straighten your elbows and lift your hands straight in front of you at shoulder height. Step back away from the secured end of band/tubing until it becomes tense.   Squeezing your shoulder blades together, draw your elbows back as you bend them. Keep your upper arm lifted away from your body throughout the exercise.   Hold ___15_______ seconds. Slowly ease the tension on the band/tubing as you reverse the directions and return to the starting position.  Repeat ___10_______ times. Complete this exercise ___4_______ times per day. STRENGTH - Scapular Depressors  Find a sturdy chair without wheels, such as a from a dining room table.   Keeping your feet on the floor, lift your bottom from the seat and lock your elbows.   Keeping your elbows straight, allow gravity to pull your body weight down. Your shoulders will rise toward your ears.   Raise your body against gravity by drawing your shoulder blades down your back, shortening the distance between your shoulders and ears. Although your feet should always maintain contact with the floor, your feet should progressively support less body weight as you get stronger.   Hold ____15______ seconds. In a controlled and slow manner, lower your body weight to begin the next repetition.  Repeat ___10_______ times. Complete this exercise ____4______ times per day.    Document Released: 02/11/2005 Document Revised: 12/10/2010 Document Reviewed: 07/12/2008 [facility] Patient Information 2012 [place].

## 2011-02-20 NOTE — Assessment & Plan Note (Signed)
I did not change the meds at this time but I will go up on his current meds to 20-25 mg combination. I think quitting smoking will also help him a lot to get his BP down. Will check Bmet at next office visit next week.

## 2011-02-21 DIAGNOSIS — M25511 Pain in right shoulder: Secondary | ICD-10-CM | POA: Insufficient documentation

## 2011-02-21 NOTE — Assessment & Plan Note (Signed)
Patient is up to date as of now.

## 2011-02-27 ENCOUNTER — Encounter: Payer: Self-pay | Admitting: Internal Medicine

## 2011-03-04 ENCOUNTER — Encounter: Payer: Self-pay | Admitting: Internal Medicine

## 2011-03-04 ENCOUNTER — Ambulatory Visit (INDEPENDENT_AMBULATORY_CARE_PROVIDER_SITE_OTHER): Payer: Medicaid Other | Admitting: Internal Medicine

## 2011-03-04 VITALS — BP 169/77 | HR 73 | Temp 97.6°F | Wt 145.8 lb

## 2011-03-04 DIAGNOSIS — E785 Hyperlipidemia, unspecified: Secondary | ICD-10-CM

## 2011-03-04 DIAGNOSIS — M25519 Pain in unspecified shoulder: Secondary | ICD-10-CM

## 2011-03-04 DIAGNOSIS — I10 Essential (primary) hypertension: Secondary | ICD-10-CM

## 2011-03-04 DIAGNOSIS — M25511 Pain in right shoulder: Secondary | ICD-10-CM

## 2011-03-04 DIAGNOSIS — F1910 Other psychoactive substance abuse, uncomplicated: Secondary | ICD-10-CM

## 2011-03-04 MED ORDER — LISINOPRIL-HYDROCHLOROTHIAZIDE 20-25 MG PO TABS: 1.0000 | ORAL_TABLET | Freq: Every day | ORAL | Status: DC

## 2011-03-04 MED ORDER — PRAVASTATIN SODIUM 40 MG PO TABS: 40.0000 mg | ORAL_TABLET | Freq: Every day | ORAL | Status: DC

## 2011-03-04 MED ORDER — BACLOFEN 10 MG PO TABS: ORAL_TABLET | ORAL | Status: AC

## 2011-03-04 NOTE — Assessment & Plan Note (Signed)
I gave him a refill prescription for pravastatin 40 as patient is unable to afford the 80 mg tablet.

## 2011-03-04 NOTE — Progress Notes (Signed)
  Subjective:    Patient ID: Lucas [name], male    DOB: [birth year], 60 y.o.   MRN: [id number]  HPI  Lucas [name] is a 60 year old man with past medical history most significant for her recent stroke for which he was hospitalized This is a followup visit for followup of his blood pressure. Her BP is still high at 169/77. I had Advised Lucas [name] to increase his blood pressure medication but he has not been compliant with those instructions. Still smoking about a quarter pack a day.  He hasn't been able to work on his orange card with [facility] and askingfor information.  He is also complaining of pain in his right shoulder and right knee which is his affected site from stroke. Planning of pain about 4/10 in intensity and more like a stiffness . Patient had complained to me about this during her last office was as well and he states it is unchanged. He hasnt been able to follow the instructions about exercises that I had given him at last office visit.    Review of Systems  Constitutional: Negative for fever, activity change and appetite change.  HENT: Negative for sore throat.   Respiratory: Negative for cough and shortness of breath.   Cardiovascular: Negative for chest pain and leg swelling.  Gastrointestinal: Negative for nausea, abdominal pain, diarrhea, constipation and abdominal distention.  Genitourinary: Negative for frequency, hematuria and difficulty urinating.  Musculoskeletal: Positive for myalgias, arthralgias and gait problem.  Neurological: Positive for weakness. Negative for dizziness and headaches.  Psychiatric/Behavioral: Negative for suicidal ideas and behavioral problems.       Objective:   Physical Exam  Constitutional: He is oriented to person, place, and time. He appears well-developed and well-nourished.  HENT:  Head: Normocephalic and atraumatic.  Eyes: Conjunctivae and EOM are normal. Pupils are equal, round, and reactive to light. No scleral icterus.  Neck:  Normal range of motion. Neck supple. No JVD present. No thyromegaly present.  Cardiovascular: Normal rate, regular rhythm, normal heart sounds and intact distal pulses.  Exam reveals no gallop and no friction rub.   No murmur heard. Pulmonary/Chest: Effort normal and breath sounds normal. No respiratory distress. He has no wheezes. He has no rales.  Abdominal: Soft. Bowel sounds are normal. He exhibits no distension and no mass. There is no tenderness. There is no rebound and no guarding.  Musculoskeletal: Normal range of motion. He exhibits tenderness. He exhibits no edema.       Skeletal exam is unchanged from last office visit  Lymphadenopathy:    He has no cervical adenopathy.  Neurological: He is alert and oriented to person, place, and time.       Exam is unchanged since last office visit. 4/5 strength in the right upper and right lower extremities. Grip strength is decreased as compared to left. Muscular atrophic and the formation of right shoulder.  Psychiatric: He has a normal mood and affect. His behavior is normal.          Assessment & Plan:

## 2011-03-04 NOTE — Assessment & Plan Note (Signed)
I provided him with pain medications from sample room which is basically ibuprofen 200 mg tablets. Prescribe him baclofen 10 mg to be taken up to 3 times a day for muscle spasticity.

## 2011-03-04 NOTE — Assessment & Plan Note (Signed)
Counseled him extensively on smoking cessation. He is not ready to quit at this time.

## 2011-03-04 NOTE — Patient Instructions (Signed)
2 Gram Low Sodium Diet A 2 gram sodium diet restricts the amount of sodium in the diet to no more than 2 g or 2000 mg daily. Limiting the amount of sodium is often used to help lower blood pressure. It is important if you have heart, liver, or kidney problems. Many foods contain sodium for flavor and sometimes as a preservative. When the amount of sodium in a diet needs to be low, it is important to know what to look for when choosing foods and drinks. The following includes some information and guidelines to help make it easier for you to adapt to a low sodium diet. QUICK TIPS  Do not add salt to food.   Avoid convenience items and fast food.   Choose unsalted snack foods.   Buy lower sodium products, often labeled as "lower sodium" or "no salt added."   Check food labels to learn how much sodium is in 1 serving.   When eating at a restaurant, ask that your food be prepared with less salt or none, if possible.  READING FOOD LABELS FOR SODIUM INFORMATION The nutrition facts label is a good place to find how much sodium is in foods. Look for products with no more than 500 to 600 mg of sodium per meal and no more than 150 mg per serving. Remember that 2 g = 2000 mg. The food label may also list foods as:  Sodium-free: Less than 5 mg in a serving.   Very low sodium: 35 mg or less in a serving.   Low-sodium: 140 mg or less in a serving.   Light in sodium: 50% less sodium in a serving. For example, if a food that usually has 300 mg of sodium is changed to become light in sodium, it will have 150 mg of sodium.   Reduced sodium: 25% less sodium in a serving. For example, if a food that usually has 400 mg of sodium is changed to reduced sodium, it will have 300 mg of sodium.  CHOOSING FOODS Grains  Avoid: Salted crackers and snack items. Some cereals, including instant hot cereals. Bread stuffing and biscuit mixes. Seasoned rice or pasta mixes.   Choose: Unsalted snack items. Low-sodium  cereals, oats, puffed wheat and rice, shredded wheat. English muffins and bread. Pasta.  Meats  Avoid: Salted, canned, smoked, spiced, pickled meats, including fish and poultry. Bacon, ham, sausage, cold cuts, hot dogs, anchovies.   Choose: Low-sodium canned tuna and salmon. Fresh or frozen meat, poultry, and fish.  Dairy  Avoid: Processed cheese and spreads. Cottage cheese. Buttermilk and condensed milk. Regular cheese.   Choose: Milk. Low-sodium cottage cheese. Yogurt. Sour cream. Low-sodium cheese.  Fruits and Vegetables  Avoid: Regular canned vegetables. Regular canned tomato sauce and paste. Frozen vegetables in sauces. Olives. Angie Fava. Relishes. Sauerkraut.   Choose: Low-sodium canned vegetables. Low-sodium tomato sauce and paste. Frozen or fresh vegetables. Fresh and frozen fruit.  Condiments  Avoid: Canned and packaged gravies. Worcestershire sauce. Tartar sauce. Barbecue sauce. Soy sauce. Steak sauce. Ketchup. Onion, garlic, and table salt. Meat flavorings and tenderizers.   Choose: Fresh and dried herbs and spices. Low-sodium varieties of mustard and ketchup. Lemon juice. Tabasco sauce. Horseradish.  SAMPLE 2 GRAM SODIUM MEAL PLAN Breakfast / Sodium (mg)  1 cup low-fat milk / [id number] mg   2 slices whole-wheat toast / 270 mg   1 tbs heart-healthy margarine / 153 mg   1 hard-boiled egg / 139 mg   1 small orange / 0  mg  Lunch / Sodium (mg)  1 cup raw carrots / 76 mg    cup hummus / 298 mg   1 cup low-fat milk / 143 mg    cup red grapes / 2 mg   1 whole-wheat pita bread / 356 mg  Dinner / Sodium (mg)  1 cup whole-wheat pasta / 2 mg   1 cup low-sodium tomato sauce / 73 mg   3 oz lean ground beef / 57 mg   1 small side salad (1 cup raw spinach leaves,  cup cucumber,  cup yellow bell pepper) with 1 tsp olive oil and 1 tsp red wine vinegar / 25 mg  Snack / Sodium (mg)  1 container low-fat vanilla yogurt / 107 mg   3 graham cracker squares / 127 mg  Nutrient  Analysis  Calories: 2033   Protein: 77 g   Carbohydrate: 282 g   Fat: 72 g   Sodium: 1971 mg  Document Released: 03/30/2005 Document Revised: 12/10/2010 Document Reviewed: 07/01/2009 [facility] Patient Information 2012 [place], [place].

## 2011-03-04 NOTE — Assessment & Plan Note (Signed)
Advised him to follow the instructions of increased dose of blood pressure meds. Have given him a fresh prescription today. Repeat blood pressure in the room was still 160/90. I will not check metabolic profile today and get it done at next office visit as patient has not increased his meds. I have given him instructions on low sodium diet. Follow up visit in 2 weeks and plan to add Norvasc if blood pressure still high.

## 2011-03-18 ENCOUNTER — Encounter: Payer: Self-pay | Admitting: Internal Medicine

## 2011-03-18 ENCOUNTER — Ambulatory Visit (INDEPENDENT_AMBULATORY_CARE_PROVIDER_SITE_OTHER): Payer: Medicaid Other | Admitting: Internal Medicine

## 2011-03-18 VITALS — BP 175/85 | HR 59 | Temp 97.8°F | Ht 65.0 in | Wt 144.5 lb

## 2011-03-18 DIAGNOSIS — E785 Hyperlipidemia, unspecified: Secondary | ICD-10-CM

## 2011-03-18 DIAGNOSIS — I10 Essential (primary) hypertension: Secondary | ICD-10-CM

## 2011-03-18 LAB — BASIC METABOLIC PANEL
BUN: 11 mg/dL (ref 6–23)
Potassium: 4.1 mEq/L (ref 3.5–5.3)
Sodium: 138 mEq/L (ref 135–145)

## 2011-03-18 MED ORDER — LISINOPRIL-HYDROCHLOROTHIAZIDE 20-25 MG PO TABS: 1.0000 | ORAL_TABLET | Freq: Every day | ORAL | Status: DC

## 2011-03-18 MED ORDER — AMLODIPINE BESYLATE 10 MG PO TABS: 10.0000 mg | ORAL_TABLET | Freq: Every day | ORAL | Status: DC

## 2011-03-18 NOTE — Progress Notes (Signed)
Subjective:   Patient ID: Lucas [name] male   DOB: 17-Dec-1950 60 y.o.   MRN: [id number]  HPI: [name] is a 60 y.o. man who presents to clinic today for follow up from his last appointment.  He states that he has been taking two of the lisinopril/HCTZ 10/12.5 since his last visit.  He denies any headaches, nausea, vomiting, or blurry vision.  He also states that he is taking his cholesterol medication as well as his daily aspirin.    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  He also needs a letter for his disability application that states he is getting his care from [place] at the [facility].    No past medical history on file. Current Outpatient Prescriptions  Medication Sig Dispense Refill  . aspirin 81 MG tablet Take 81 mg by mouth daily.        . baclofen (LIORESAL) 10 MG tablet Take 1 tab every 8 hours as need for pain and stiffness. Do not take more then 3 tabs a day.  90 tablet  1  . lisinopril-hydrochlorothiazide (PRINZIDE,ZESTORETIC) 20-25 MG per tablet Take 1 tablet by mouth daily.  31 tablet  5  . Multiple Vitamin (MULTIVITAMIN) tablet Take 1 tablet by mouth daily.  30 tablet  11  . pravastatin (PRAVACHOL) 40 MG tablet Take 1 tablet (40 mg total) by mouth daily.  31 tablet  5   No family history on file. History   Social History  . Marital Status: Single    Spouse Name: N/A    Number of Children: N/A  . Years of Education: N/A   Social History Main Topics  . Smoking status: Current Some Day Smoker -- 0.2 packs/day for 40 years    Types: Cigarettes  . Smokeless tobacco: None  . Alcohol Use: 0.0 oz/week  . Drug Use: No  . Sexually Active: None   Other Topics Concern  . None   Social History Narrative  . None   Review of Systems: Constitutional: Denies fever, chills, diaphoresis, appetite change and fatigue.  HEENT: Denies photophobia, eye pain, redness, hearing loss, ear pain, congestion, sore throat,  rhinorrhea, sneezing, mouth sores, trouble swallowing, neck pain, neck stiffness and tinnitus.   Respiratory: Denies SOB, DOE, cough, chest tightness,  and wheezing.   Cardiovascular: Denies chest pain, palpitations and leg swelling.  Gastrointestinal: Denies nausea, vomiting, abdominal pain, diarrhea, constipation, blood in stool and abdominal distention.  Genitourinary: Denies dysuria, urgency, frequency, hematuria, flank pain and difficulty urinating.  Musculoskeletal: Denies myalgias, back pain, joint swelling, arthralgias and gait problem.  Skin: Denies pallor, rash and wound.  Neurological: Denies dizziness, seizures, syncope, weakness, light-headedness, numbness and headaches.  Hematological: Denies adenopathy. Easy bruising, personal or family bleeding history  Psychiatric/Behavioral: Denies suicidal ideation, mood changes, confusion, nervousness, sleep disturbance and agitation  Objective:  Physical Exam: Filed Vitals:   03/18/11 1507  BP: 175/85  Pulse: 59  Temp: 97.8 F (36.6 C)  TempSrc: Oral  Height: 5\' 5"  (1.651 m)  Weight: 144 lb 8 oz (65.545 kg)  SpO2: 100%   Constitutional: Vital signs reviewed.  Patient is a well-developed and well-nourished man in no acute distress and cooperative with exam. Alert and oriented x3.  Head: Normocephalic and atraumatic Ear: TM normal bilaterally Mouth: no erythema or exudates, MMM Eyes: PERRL, EOMI, conjunctivae normal, No scleral icterus.  Neck: Supple, Trachea midline normal ROM, No JVD, mass, thyromegaly, or carotid bruit  present.  Cardiovascular: RRR, S1 normal, S2 normal, no MRG, pulses symmetric and intact bilaterally Pulmonary/Chest: CTAB, no wheezes, rales, or rhonchi Abdominal: Soft. Non-tender, non-distended, bowel sounds are normal, no masses, organomegaly, or guarding present.  Musculoskeletal: No joint deformities, erythema, or stiffness, ROM full and no nontender Hematology: no cervical, inginal, or axillary adenopathy.   Neurological: A&O x3, Strength is 5/5 on the left upper and lower extremity. 4+/5 in the right upper extremity and 4+/5 right lower extremity.  cranial nerve II-XII are grossly intact, no focal motor deficit, sensory intact to light touch bilaterally.  Skin: Warm, dry and intact. No rash, cyanosis, or clubbing.  Psychiatric: Normal mood and affect. speech and behavior is normal. Judgment and thought content normal. Cognition and memory are normal.   Assessment & Plan:

## 2011-03-18 NOTE — Patient Instructions (Addendum)
1.  Continue taking your medications for now.  2.  Start a new medication called Amlodipine 10 mg tablets.  Take 1 tablet daily  3.   Stop in the lab to have your blood drawn.  If we need to follow up on anything I will call you.  4.  Follow up in 2 weeks to recheck your blood pressure.  Items required to complete an Eligibility Application   1. Picture ID (Can't be expired) 2. Current Bill to establish proof of residency 3. W-2 & Tax return (if self-employed include "Schedule C"), if not filing Form 4506 4. 4 current Pay stubs for this year 5. Printout of other income (Social security, unemployment, child support, workmen's comp) 6. Food stamp award letter, if receiving  7. Life Insurance (Need copy of the front page, showing name Ins Co. Name, and face amount). 8. Statement for pension, 401-K, IRS (needs to have current balance) 9. Tax Value for cars, houses, mobile homes, and land (Get from [facility] Tax Department) 10. Disability Paperwork (showing status of case) 11. College students: Print out of [place] received, tuition cost, books, etc. 12. If no Income: Games developer of support for free shelter, money, food, Social research officer, government.  Bring all that you can to your follow up appointment to start the process.

## 2011-03-21 NOTE — Assessment & Plan Note (Signed)
Lab Results  Component Value Date   CHOL 241* 12/10/2010   CHOL 201* 02/20/2010   Lab Results  Component Value Date   HDL 53 12/10/2010   HDL 64 02/20/2010   Lab Results  Component Value Date   LDLCALC 140* 12/10/2010   LDLCALC 106* 02/20/2010   Lab Results  Component Value Date   TRIG 241* 12/10/2010   TRIG 156* 02/20/2010   Lab Results  Component Value Date   CHOLHDL 4.5 12/10/2010   CHOLHDL 3.1 Ratio 02/20/2010   No results found for this basename: LDLDIRECT   He is due for a recheck of his cholesterol at his follow up visit in 2 weeks.  His goal LDL is <100 and he may even be a candidate for LDL <70.

## 2011-03-21 NOTE — Assessment & Plan Note (Signed)
Lab Results  Component Value Date   NA 138 03/18/2011   K 4.1 03/18/2011   CL 105 03/18/2011   CO2 23 03/18/2011   BUN 11 03/18/2011   CREATININE 1.05 03/18/2011   CREATININE 0.85 12/13/2010    BP Readings from Last 3 Encounters:  03/18/11 175/85  03/04/11 169/77  02/20/11 147/78    Assessment: Hypertension control:  moderately elevated  Progress toward goals:  unchanged Barriers to meeting goals:  nonadherence to medications and lack of understanding of disease management  Plan: Hypertension treatment:  continue current medications He states that he has been taking his medication and after calling the [facility] that his scripts were sent to they didn''t have a record of his fills.  He states his neice bought them at [facility].  HE states he has been taking his medication.  We will resend the prescription for the Lisinopril/HCTZ 20/25 mg as well as start Amlodipine 10 mg daily.  He will come back in about 2 weeks for a recheck of his blood pressure.

## 2011-04-02 ENCOUNTER — Encounter: Payer: Self-pay | Admitting: Internal Medicine

## 2011-04-02 NOTE — Progress Notes (Signed)
This encounter was created in error - please disregard.

## 2011-04-15 ENCOUNTER — Ambulatory Visit (INDEPENDENT_AMBULATORY_CARE_PROVIDER_SITE_OTHER): Payer: Medicaid Other | Admitting: Internal Medicine

## 2011-04-15 ENCOUNTER — Encounter: Payer: Self-pay | Admitting: Internal Medicine

## 2011-04-15 VITALS — BP 156/85 | HR 55 | Temp 97.5°F | Ht 65.0 in | Wt 143.9 lb

## 2011-04-15 DIAGNOSIS — E785 Hyperlipidemia, unspecified: Secondary | ICD-10-CM

## 2011-04-15 DIAGNOSIS — I10 Essential (primary) hypertension: Secondary | ICD-10-CM

## 2011-04-15 MED ORDER — LISINOPRIL-HYDROCHLOROTHIAZIDE 10-12.5 MG PO TABS: 1.0000 | ORAL_TABLET | Freq: Every day | ORAL | Status: DC

## 2011-04-15 NOTE — Progress Notes (Signed)
  Subjective:   Patient ID: Lucas [name] male   DOB: 1950/11/26 61 y.o.   MRN: [id number]  HPI: [name] is a 61 y.o. Male with PMH significant for HTN, HLD, CVA who presented to the clinic for a follow up for blood pressure. Was given prescription for Lisinopril/HCTZ 20/25 mg and was started on Amlodipine 10 mg daily. Patient noted he is doing fine but is complaining about some right side shoulder pain that has been present for some time. He reports he was not able to fill his prescription . Currently is not taking any medication.       Current Outpatient Prescriptions  Medication Sig Dispense Refill  . amLODipine (NORVASC) 10 MG tablet Take 1 tablet (10 mg total) by mouth daily.  30 tablet  11  . aspirin 81 MG tablet Take 81 mg by mouth daily.        . baclofen (LIORESAL) 10 MG tablet Take 1 tab every 8 hours as need for pain and stiffness. Do not take more then 3 tabs a day.  90 tablet  1  . lisinopril-hydrochlorothiazide (PRINZIDE,ZESTORETIC) 20-25 MG per tablet Take 1 tablet by mouth daily.  31 tablet  5  . Multiple Vitamin (MULTIVITAMIN) tablet Take 1 tablet by mouth daily.  30 tablet  11  . pravastatin (PRAVACHOL) 40 MG tablet Take 1 tablet (40 mg total) by mouth daily.  31 tablet  5   Review of Systems: Constitutional: Denies fever, chills, diaphoresis, Respiratory: Denies SOB, DOE, cough, chest tightness,  and wheezing.   Cardiovascular: Denies chest pain, palpitations and leg swelling.  Gastrointestinal: Denies nausea, vomiting, abdominal pain, diarrhea, Neurological: Denies dizziness,light-headedness, numbness    Objective:  Physical Exam: Filed Vitals:   04/15/11 0826  BP: 158/77  Pulse: 55  Temp: 97.5 F (36.4 C)  TempSrc: Oral  Height: 5\' 5"  (1.651 m)  Weight: 143 lb 14.4 oz (65.273 kg)   Constitutional: Vital signs reviewed.  Patient is a well-developed and well-nourished  in no acute distress and cooperative with exam. Alert and oriented x3. Alcohol  smell was noted.  Neck: Supple,  Cardiovascular: RRR, S1 normal, S2 normal, no MRG, pulses symmetric and intact bilaterally Pulmonary/Chest: CTAB, no wheezes, rales, or rhonchi Abdominal: Soft. Non-tender, non-distended, bowel sounds are normal,  Neurological: A&O x3, no focal motor deficit,

## 2011-04-15 NOTE — Assessment & Plan Note (Addendum)
Patient has not filled any of his blood pressure medication due to financial restrains . I recommended just to take the Lisinopril -HCTZ 10-12.5 and not the Norvasc considering his blood pressure of 156/77. Will touch base with Clarice Pole since he has no insurance. He noted that will have money end of this week. Will reevaluate in 3 weeks.

## 2011-04-15 NOTE — Assessment & Plan Note (Signed)
Will not check Lipid panel since patient has not been taking any medication due to financial restrain.

## 2011-04-17 NOTE — Progress Notes (Signed)
Addended by: Truddie Crumble on: 04/17/2011 11:21 AM   Modules accepted: Orders

## 2011-05-11 ENCOUNTER — Encounter: Payer: Self-pay | Admitting: Internal Medicine

## 2011-05-12 ENCOUNTER — Encounter: Payer: Self-pay | Admitting: Internal Medicine

## 2011-05-12 ENCOUNTER — Ambulatory Visit (INDEPENDENT_AMBULATORY_CARE_PROVIDER_SITE_OTHER): Payer: Medicaid Other | Admitting: Internal Medicine

## 2011-05-12 VITALS — BP 159/76 | HR 62 | Temp 97.5°F

## 2011-05-12 DIAGNOSIS — I639 Cerebral infarction, unspecified: Secondary | ICD-10-CM

## 2011-05-12 DIAGNOSIS — E785 Hyperlipidemia, unspecified: Secondary | ICD-10-CM

## 2011-05-12 DIAGNOSIS — I6350 Cerebral infarction due to unspecified occlusion or stenosis of unspecified cerebral artery: Secondary | ICD-10-CM

## 2011-05-12 DIAGNOSIS — I10 Essential (primary) hypertension: Secondary | ICD-10-CM

## 2011-05-12 MED ORDER — LISINOPRIL-HYDROCHLOROTHIAZIDE 10-12.5 MG PO TABS: 1.0000 | ORAL_TABLET | Freq: Every day | ORAL | Status: DC

## 2011-05-12 MED ORDER — PRAVASTATIN SODIUM 40 MG PO TABS: 40.0000 mg | ORAL_TABLET | Freq: Every day | ORAL | Status: DC

## 2011-05-12 NOTE — Patient Instructions (Addendum)
Please take an aspirin every day. At least 81 mg or up to 325 mg. Can get the cheap aspirin - it is OK.  Take your blood pressure medicine once a day.  Take your cholesterol medicine once a day.  Stop smoking.

## 2011-05-14 NOTE — Assessment & Plan Note (Signed)
He has not been taking his statin for one month. I explained that it is available for $4 at many pharmacies and I sent it to [facility] and he said he would be able to afford it. I stressed that this med with help to prevent another CVA in the future. No need to check lipids as has not been taking the med.

## 2011-05-14 NOTE — Progress Notes (Signed)
  Subjective:    Patient ID: Lucas [name], male    DOB: 06-18-1950, 61 y.o.   MRN: [id number]  HPI  Please see the A&P for the status of the pt's chronic medical problems.  Review of Systems  Constitutional: Negative for activity change, appetite change and unexpected weight change.  Respiratory: Negative for cough and shortness of breath.   Cardiovascular: Negative for chest pain.  Musculoskeletal: Positive for arthralgias and gait problem.  Skin: Negative for color change and wound.  Neurological: Positive for weakness.       Objective:   Physical Exam  Constitutional: He appears well-developed and well-nourished. He appears distressed.  HENT:  Head: Normocephalic and atraumatic.  Right Ear: External ear normal.  Left Ear: External ear normal.  Nose: Nose normal.  Eyes: Conjunctivae and EOM are normal.  Cardiovascular: Normal rate, regular rhythm and normal heart sounds.   Skin: Skin is warm and dry. He is not diaphoretic.  Psychiatric: He has a normal mood and affect. His speech is normal and behavior is normal. Judgment and thought content normal. Cognition and memory are normal.       Has some difficulty grasping the complexity of what a CVA is, the implications, and the prognosis. Asks same questions repeatedly.          Assessment & Plan:

## 2011-05-14 NOTE — Assessment & Plan Note (Signed)
Has not been taking the Lisinopril HCTZ for over one month. I again stressed that it is $4 and that it will help to decrease the future CVA risk.

## 2011-05-14 NOTE — Assessment & Plan Note (Signed)
I spent a long time trying to explain what a CVA is, what caused it, what his prognosis is, and most importantly how to prevent the next stroke. I stressed that taking an ASA (any dose as studies show that the dose is not that important), his statin, and keeping his BP controlled is key to prevent the next stroke. I do not know how much he understood. Could consider mini-cog next visit and inquiring about how he handles finances and such. No overt dementia. He does admit to some memory issues. He is able to use bus I.

## 2011-06-09 ENCOUNTER — Ambulatory Visit (INDEPENDENT_AMBULATORY_CARE_PROVIDER_SITE_OTHER): Payer: Medicaid Other | Admitting: Internal Medicine

## 2011-06-09 ENCOUNTER — Encounter: Payer: Self-pay | Admitting: Internal Medicine

## 2011-06-09 DIAGNOSIS — I639 Cerebral infarction, unspecified: Secondary | ICD-10-CM

## 2011-06-09 DIAGNOSIS — F1910 Other psychoactive substance abuse, uncomplicated: Secondary | ICD-10-CM

## 2011-06-09 DIAGNOSIS — E785 Hyperlipidemia, unspecified: Secondary | ICD-10-CM

## 2011-06-09 DIAGNOSIS — I10 Essential (primary) hypertension: Secondary | ICD-10-CM

## 2011-06-09 DIAGNOSIS — I6350 Cerebral infarction due to unspecified occlusion or stenosis of unspecified cerebral artery: Secondary | ICD-10-CM

## 2011-06-09 DIAGNOSIS — Z0000 Encounter for general adult medical examination without abnormal findings: Secondary | ICD-10-CM

## 2011-06-09 NOTE — Assessment & Plan Note (Signed)
Not on statin so no reason to recheck FLP.

## 2011-06-09 NOTE — Progress Notes (Signed)
  Subjective:    Patient ID: Lucas [name], male    DOB: 08-19-1950, 61 y.o.   MRN: [id number]  HPI  Please see the A&P for the status of the pt's chronic medical problems.   Review of Systems     Objective:   Physical Exam  Constitutional: He appears well-developed and well-nourished. No distress.  HENT:  Head: Normocephalic and atraumatic.       Residual unilateral facial droop. Mild  Eyes: Conjunctivae are normal.  Neurological: He is alert.  Skin: He is not diaphoretic.  Psychiatric: He has a normal mood and affect. His behavior is normal. Thought content normal.          Assessment & Plan:

## 2011-06-09 NOTE — Assessment & Plan Note (Signed)
Is on ASA! One success!Marland Kitchen

## 2011-06-09 NOTE — Assessment & Plan Note (Signed)
Still has odor of tobacco.

## 2011-06-09 NOTE — Patient Instructions (Signed)
Talk to your niece about colon cancer screening.  See Marlana Latus.  See me one month after you are able to get your medicine.

## 2011-06-09 NOTE — Assessment & Plan Note (Signed)
Not on meds. BP still high. I asked him to see me one month after he starts the meds. He does not have to copay for medicaid nor was he able to afford to $4 med before getting medicaid. Will see if can refer to [facility] or hat ever covers medicaid pts.

## 2011-06-09 NOTE — Assessment & Plan Note (Signed)
I discussed colon cancer screen today and ask that he speak to his niece about it as she is a Marine scientist.

## 2011-07-07 ENCOUNTER — Ambulatory Visit: Payer: Medicaid Other | Admitting: Internal Medicine

## 2011-09-15 ENCOUNTER — Encounter (HOSPITAL_COMMUNITY): Payer: Self-pay | Admitting: Radiology

## 2011-09-15 ENCOUNTER — Emergency Department (HOSPITAL_COMMUNITY)
Admission: EM | Admit: 2011-09-15 | Discharge: 2011-09-15 | Disposition: A | Discharge status: Home / Self Care | Payer: Medicaid Other | Attending: Emergency Medicine | Admitting: Emergency Medicine

## 2011-09-15 DIAGNOSIS — R079 Chest pain, unspecified: Secondary | ICD-10-CM | POA: Insufficient documentation

## 2011-09-15 DIAGNOSIS — E785 Hyperlipidemia, unspecified: Secondary | ICD-10-CM | POA: Insufficient documentation

## 2011-09-15 DIAGNOSIS — I10 Essential (primary) hypertension: Secondary | ICD-10-CM | POA: Insufficient documentation

## 2011-09-15 DIAGNOSIS — Z8673 Personal history of transient ischemic attack (TIA), and cerebral infarction without residual deficits: Secondary | ICD-10-CM | POA: Insufficient documentation

## 2011-09-15 DIAGNOSIS — F17200 Nicotine dependence, unspecified, uncomplicated: Secondary | ICD-10-CM | POA: Insufficient documentation

## 2011-09-15 NOTE — ED Notes (Signed)
[place] [employer] at bedside
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
## 2011-09-15 NOTE — Discharge Instructions (Signed)
Stop smoking and drinking excessive amounts of alcohol.  Use ibuprofen 600 mg 4 recurrent pain.  Followup with health service if your symptoms are her become persistent.  Return for worse or uncontrolled symptoms
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
## 2012-08-16 ENCOUNTER — Ambulatory Visit: Payer: Medicaid Other | Admitting: Internal Medicine

## 2012-08-22 ENCOUNTER — Encounter: Payer: Self-pay | Admitting: Licensed Clinical Social Worker

## 2012-08-22 ENCOUNTER — Ambulatory Visit (INDEPENDENT_AMBULATORY_CARE_PROVIDER_SITE_OTHER): Payer: Medicaid Other | Admitting: Internal Medicine

## 2012-08-22 ENCOUNTER — Encounter: Payer: Self-pay | Admitting: Internal Medicine

## 2012-08-22 VITALS — BP 195/83 | HR 100 | Temp 97.1°F | Ht 65.0 in | Wt 134.7 lb

## 2012-08-22 DIAGNOSIS — E785 Hyperlipidemia, unspecified: Secondary | ICD-10-CM

## 2012-08-22 DIAGNOSIS — F1910 Other psychoactive substance abuse, uncomplicated: Secondary | ICD-10-CM

## 2012-08-22 DIAGNOSIS — I639 Cerebral infarction, unspecified: Secondary | ICD-10-CM

## 2012-08-22 DIAGNOSIS — I10 Essential (primary) hypertension: Secondary | ICD-10-CM

## 2012-08-22 DIAGNOSIS — I6350 Cerebral infarction due to unspecified occlusion or stenosis of unspecified cerebral artery: Secondary | ICD-10-CM

## 2012-08-22 LAB — LIPID PANEL
LDL Cholesterol: 110 mg/dL — ABNORMAL HIGH (ref 0–99)
Total CHOL/HDL Ratio: 3.3 Ratio
VLDL: 28 mg/dL (ref 0–40)

## 2012-08-22 LAB — COMPLETE METABOLIC PANEL WITH GFR
Albumin: 4.3 g/dL (ref 3.5–5.2)
Alkaline Phosphatase: 72 U/L (ref 39–117)
GFR, Est Non African American: 83 mL/min
Glucose, Bld: 108 mg/dL — ABNORMAL HIGH (ref 70–99)
Potassium: 4.5 mEq/L (ref 3.5–5.3)
Sodium: 140 mEq/L (ref 135–145)
Total Protein: 7 g/dL (ref 6.0–8.3)

## 2012-08-22 MED ORDER — ASPIRIN 81 MG PO TABS: 81.0000 mg | ORAL_TABLET | Freq: Every day | ORAL | Status: DC

## 2012-08-22 MED ORDER — PRAVASTATIN SODIUM 80 MG PO TABS: 80.0000 mg | ORAL_TABLET | Freq: Every evening | ORAL | Status: DC

## 2012-08-22 MED ORDER — AMLODIPINE BESYLATE 10 MG PO TABS: 10.0000 mg | ORAL_TABLET | Freq: Every day | ORAL | Status: DC

## 2012-08-22 MED ORDER — LISINOPRIL-HYDROCHLOROTHIAZIDE 10-12.5 MG PO TABS
1.0000 | ORAL_TABLET | Freq: Every day | ORAL | Status: DC
Start: 2012-08-22 — End: 2013-02-13

## 2012-08-22 NOTE — Assessment & Plan Note (Signed)
Restart aspirin along with blood pressure medications and statin.
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
## 2012-08-22 NOTE — Patient Instructions (Addendum)
Please make a followup appointment before end of this month.  - Start taking aspirin, Prinzide, Norvasc 1 tablet each daily.  - Will get your lipid panel checked today along with electrolytes, kidney and liver functions.

## 2012-08-22 NOTE — Assessment & Plan Note (Signed)
Not using cocaine. Still smokes about 5-6 cigarettes every week as he does not have money to buy more

## 2012-08-22 NOTE — Assessment & Plan Note (Signed)
Not on statin. - Check lipid panel. - Last LDL 140 in 2012.

## 2012-08-22 NOTE — Progress Notes (Signed)
Case discussed with Dr. Patel at time of visit. We reviewed the resident's history and exam and pertinent patient test results. I agree with the assessment, diagnosis, and plan of care documented in the resident's note. 

## 2012-08-22 NOTE — Progress Notes (Signed)
  Subjective:    Patient ID: Lucas [name], male    DOB: 06/29/1950, 62 y.o.   MRN: [id number]  HPI patient is a pleasant 62 year old man with history of CVA, hypertension, hyperlipidemia and other problems as per problem list who comes the clinic for re-establishment of care.  He was seen by Dr. Lynnae January in February 2013 but unfortunately after that he got incarcerated and lost followup. He did not take any medications since then not even aspirin. He denies any significant new symptoms since last clinic visit. Denies any fever, chills, nausea vomiting, abdominal pain, chest pain, short of breath, diarrhea, headache, palpitations, weight loss.  He smokes about 5-6 cigarettes every week, says he is broke does not have money.  He is not on any drugs.   He does report slowly losing his memory after his stroke.  Says it's hard for him to remember most of the stuff now. He lives with his sister in [place].  He is willing to do all he has to do to make his health better and survive.   Review of Systems     as per history of present illness.  Objective:   Physical Exam  General: Shabby looking patient. NAD HEENT: PERRL, EOMI, no scleral icterus Cardiac: S1, S2, RRR, no rubs, murmurs or gallops Pulm: clear to auscultation bilaterally, moving normal volumes of air Abd: soft, nontender, nondistended, BS present Ext: warm and well perfused, no pedal edema Neuro: alert and oriented X3, cranial nerves II-XII grossly intact       Assessment & Plan:

## 2012-08-22 NOTE — Assessment & Plan Note (Addendum)
BP Readings from Last 3 Encounters:  08/22/12 195/83  09/15/11 161/81  06/09/11 171/79    Lab Results  Component Value Date   NA 138 03/18/2011   K 4.1 03/18/2011   CREATININE 1.05 03/18/2011    Assessment: Blood pressure control:   severely elevated systolic blood pressure. Progress toward BP goal:    deteriorated Comments: Noncompliance with medications.  Plan: Medications:  Start back on Prinzide 10/12.5 and Norvasc 10 mg daily. Educational resources provided:   Self management tools provided:   Other plans: Reevaluate in a week, but patient wants to come back by end of the month. - Check CMP.

## 2012-09-09 ENCOUNTER — Ambulatory Visit: Payer: Medicaid Other | Admitting: Internal Medicine

## 2013-01-31 ENCOUNTER — Encounter: Payer: Self-pay | Admitting: Internal Medicine

## 2013-01-31 ENCOUNTER — Encounter: Payer: Medicaid Other | Admitting: Internal Medicine

## 2013-02-13 ENCOUNTER — Encounter: Payer: Self-pay | Admitting: Licensed Clinical Social Worker

## 2013-02-13 ENCOUNTER — Ambulatory Visit (INDEPENDENT_AMBULATORY_CARE_PROVIDER_SITE_OTHER): Payer: Medicaid Other | Admitting: Internal Medicine

## 2013-02-13 ENCOUNTER — Encounter: Payer: Self-pay | Admitting: Internal Medicine

## 2013-02-13 VITALS — BP 163/78 | HR 56 | Temp 97.9°F | Ht 63.0 in | Wt 135.8 lb

## 2013-02-13 DIAGNOSIS — F1910 Other psychoactive substance abuse, uncomplicated: Secondary | ICD-10-CM

## 2013-02-13 DIAGNOSIS — I10 Essential (primary) hypertension: Secondary | ICD-10-CM

## 2013-02-13 DIAGNOSIS — E785 Hyperlipidemia, unspecified: Secondary | ICD-10-CM

## 2013-02-13 DIAGNOSIS — R413 Other amnesia: Secondary | ICD-10-CM

## 2013-02-13 DIAGNOSIS — Z0000 Encounter for general adult medical examination without abnormal findings: Secondary | ICD-10-CM

## 2013-02-13 DIAGNOSIS — I639 Cerebral infarction, unspecified: Secondary | ICD-10-CM

## 2013-02-13 MED ORDER — ASPIRIN 81 MG PO TABS: 81.0000 mg | ORAL_TABLET | Freq: Every day | ORAL | Status: DC

## 2013-02-13 MED ORDER — PRAVASTATIN SODIUM 80 MG PO TABS: 80.0000 mg | ORAL_TABLET | Freq: Every evening | ORAL | Status: DC

## 2013-02-13 MED ORDER — AMLODIPINE BESYLATE 10 MG PO TABS: 10.0000 mg | ORAL_TABLET | Freq: Every day | ORAL | Status: DC

## 2013-02-13 MED ORDER — LISINOPRIL-HYDROCHLOROTHIAZIDE 10-12.5 MG PO TABS: 1.0000 | ORAL_TABLET | Freq: Every day | ORAL | Status: DC

## 2013-02-13 NOTE — Assessment & Plan Note (Addendum)
Assessment-   BP& BMI  02/13/2013 08/22/2012  BP 163/78 195/83  BMI 24.06 22.42   Last CMP done 08/22/2012, showed- Na- 140, K- 4.5, Cr-.99, BUN- 10, Gluc- 108.  Blood pressure control: severely elevated systolic blood pressure.  Progress toward BP goal: deteriorated  Comments: Noncompliance with medications.   Plan- Patient has been chronically non-compliant with his medication. Emhasized to him again the importance of taking his medication. - Social worker talked to patient and provided resources for patient to help pt with compliance. - Pt refused to have CMP checked today, deferred till next visit.

## 2013-02-13 NOTE — Assessment & Plan Note (Signed)
Patient is vehemently against a colonoscopy, despite explanations about the importance of colon cancer screening.

## 2013-02-13 NOTE — Assessment & Plan Note (Addendum)
Assessment-  Patient says this has been an on-going problem since 2012 when he had a stroke. MRI findings in 2011 showed- Periventricular white matter hyperintensity, most likely due to chronic ischemia. Demyelinating disease is in the differential diagnosis. Chronic lacunar infarct in the left paracentral pons. Multiple foci of chronic micro hemorrhage in the frontal lobes, with a pattern typical of prior head trauma.  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  Plan-  - To work patient up for other causes of memory loss- TSH, B12 and folate levels, but patinet declined any blood work today. Says he will like them done on his next visit. - Emphasized the importance of getting his medication. - Emphasized the importance of smoking cessation, stressing to patient the increased risk this puts him at, and he could use the money for his medication co-pay.

## 2013-02-13 NOTE — Patient Instructions (Signed)
Please we will like you to start taking your medications as this will prevent you from having another stroke. Please call the number our social worker gave you, this can help with affording the co-pay for your medications. Also quitting smoking will help you prevent another stroke. Also the money could be used to help you afford your medications. On your next visit we will be doing some blood work to ensure that your memeory issues are not due to other causes which might be treatable.

## 2013-02-13 NOTE — Progress Notes (Addendum)
Mr. Lucas [name] requesting to speak with CSW.  CSW met with pt prior to physician.  Pt states he is in need of transportation/bus pass to home as he walked to his appointment today.  Pt also states any housing assistance would be beneficial.  Pt is currently staying with family.  Lucas [name] niece assisted with signing him up for the wait list through the [employer].  Pt is 65, CSW provided Lucas [name] with [place] listing and highlighted those within his age.  CSW confirmed with [facility], pt has been approved for [employer] transportation.  CSW provided Mr. Lucas [name] with 3 reduced fare passes, phone number to [employer] and information on Engineer, structural for additional transportation assistance.  CSW performed inquiry with [facility], pt has not used Medicaid to obtain prescriptions since May 2014.  Pt receives over $400/month, pays family $280 rent.  Mr. Lucas [name] states funds are limited for medications. CSW discussed referral to [facility] for community care management and health education.  Pt in agreement.  Referral made to [facility].  Pt denies add'l needs at this time and is aware CSW if available to assist as needed.

## 2013-02-13 NOTE — Progress Notes (Signed)
Patient ID: Lucas [name], male   DOB: 05/17/1950, 62 y.o.   MRN: [id number]   Subjective:   Patient ID: Lucas [name] male   DOB: 03/02/1951 62 y.o.   MRN: [id number]  HPI: [name] is a 62 y.o. male with PMH of CVA, with mild Left sided deficit and memory problems, HTN.  Patient presented today with complaints of continued memory problems, which started after his stroke in 2012. He reports that he sometimes goes to convenience stores to buy things and forgets where his money is, on some occasion he has gone out and forgotten where he is. Patient lives with his sister, carries out most ADLs by himself. Doesn't cook much, or use the cooker, as he says he doesn't want to forget to put it off, but also has not had an occasions when he forgets to put off the cooker.  Patient is also a known hypertensive on lisinopril-HCT and Amlodipine, but has not taken any of his medications in over 2 weeks, he says he has absolutely no money to afford the co-pay. Pt currently smokes cigarettes- 2 packs/week, occasionally smokes Marijuana, but denies use of cocaine or heroine. No complaints of constipation, cold intolerance, no family hx of similar condition, patient denies urinary incontinence, says the 2 times he had accidents was when his sister who he lives with delayed him when he wanted to use the bathroom.    Past Medical History  Diagnosis Date  . Hypertension 11/2010    Diagnosed during acute CVA hospitalization  . Hyperlipidemia 01/06/2011    Diagnosed during acute CVA hospitalization. On statin.  . Polysubstance abuse 01/06/2011    cocaine, tobacco, THC  . CVA (cerebral infarction) 11/2010    CT 8/12 : Sm ischemic infarct posterior limb of L internal capsule. Carotids nl. ECHO nl with EF 55-60%. R sided weakness.    Current Outpatient Prescriptions  Medication Sig Dispense Refill  . amLODipine (NORVASC) 10 MG tablet Take 1 tablet (10 mg total) by mouth daily.  30 tablet  11  . aspirin 81 MG  tablet Take 1 tablet (81 mg total) by mouth daily.  30 tablet  11  . lisinopril-hydrochlorothiazide (PRINZIDE,ZESTORETIC) 10-12.5 MG per tablet Take 1 tablet by mouth daily.  30 tablet  5  . pravastatin (PRAVACHOL) 80 MG tablet Take 1 tablet (80 mg total) by mouth every evening.  30 tablet  11   No current facility-administered medications for this visit.   No family history on file. History   Social History  . Marital Status: Single    Spouse Name: N/A    Number of Children: N/A  . Years of Education: N/A   Social History Main Topics  . Smoking status: Current Some Day Smoker -- 0.20 packs/day for 40 years    Types: Cigarettes  . Smokeless tobacco: None     Comment: 2-3 per week  . Alcohol Use: 0.0 oz/week    0.25 drink(s) per week     Comment: beer  . Drug Use: No  . Sexual Activity: None   Other Topics Concern  . None   Social History Narrative  . None   Review of Systems CONSTITUTIONAL- No Fever, weightloss, no changes in appetite. SKIN- No Rash, itching. HEAD- No Headache,or dizziness. EYES- No Vision loss, or blurred vision. RESPIRATORY- Coughs occasionally, or SOB. CARDIAC- No Palpitations, DOE, PND,or chest pain. GI-  No nausea, vomiting, diarrhoea, constipation,or abd pain. URINARY- Frequency, polyuria, nocturia, hesitancy, urgency,incontinence NEUROLOGIC-No Numbness,or  syncope.  Objective:  Physical Exam: Filed Vitals:   02/13/13 0915  BP: 163/78  Pulse: 56  Temp: 97.9 F (36.6 C)  TempSrc: Oral  Height: 5\' 3"  (1.6 m)  Weight: 135 lb 12.8 oz (61.598 kg)  SpO2: 98%   GENERAL- alert, co-operative, appears as stated age, not in any distress. HEENT- Atraumatic, normocephalic, PERRL, EOMI, oral mucosa moist. No carotid bruit, no cervical LN enlargement, thyroid does not appear enlarged. CARDIAC- RRR, no murmurs, rubs or gallops. RESP- Moving equal volumes of air, and clear to auscultation bilaterally. ABDOMEN- Soft,non tender, no palpable masses or  organomegaly, bowel sounds present. NEURO- Patient is oriented to time place and person, remembers his date of birth and remote history pertinent to his medical history. Three object recall immed and 5 minute recall intact. No Cr N deficit. No gait abnormalities. EXTREMITIES- pulse 2+, symmetric, no pedal edema. SKIN- Warm, dry, No rash or lesion. PSYCH- Normal mood and affect, appropriate thought content and speech.  Assessment & Plan:  The patient's case and plan of care was discussed with attending physician, Dr. Lenna Sciara. Marinda Elk. Please see problem based chatting for Assessment and plan.

## 2013-02-14 NOTE — Assessment & Plan Note (Signed)
Currently smokes 2-3 packs a week. Occasionally smokes weed, denies cocaine abuse. Patient says he does not want nicotine patch. He says he can stop if he wants to stop on his own. He says he previously stopped smoking for 1 week and for 1 month at another time, and because of stress he started again.

## 2013-02-14 NOTE — Assessment & Plan Note (Addendum)
Assessment- Last lipid profile- 08/22/2012, LDL- 110. Currently on pravastatin- 80mg  daily. LDL goal < 100.  Plan- Lipid profile check next visit.  Patient not compliant with medication, counselled on importance of adherence.

## 2013-02-23 ENCOUNTER — Encounter: Payer: Self-pay | Admitting: Licensed Clinical Social Worker

## 2013-02-23 ENCOUNTER — Telehealth: Payer: Self-pay | Admitting: Licensed Clinical Social Worker

## 2013-02-23 NOTE — Telephone Encounter (Signed)
Lucas [name] I have not seen the pt since 05/2012 so I am unable to write a note that he is not able to work. He may wait until his next appt for the letter or he can come in sooner to be assessed. Is he on disability? If so, then I would be happy to provide a letter stating that he was on disability until the next appt. Thanks

## 2013-02-23 NOTE — Telephone Encounter (Signed)
CSW received message from pt's [facility] care manager, K. Rush LPN.  Nurse requesting pt's medications to be called/sent to [facility], [phone].  Pt has not filled medications since last visit, pt has hard copy of prescriptions inside the home but does not have access to them at this time.  Pt lives with sister and sister locks doors when she leaves the home and does not allow pt to stay in the home alone.  [facility] care manager met with pt outside the home today.

## 2013-02-23 NOTE — Telephone Encounter (Signed)
Talked to [facility] [place] - (rxs can be transferred which were filled 02/13/13 ;pts can do this themselves); Cloyde Reams will call [facility] pharmacy to transfer rxs x 4 to them per Edwena Blow Grady,SW note.

## 2013-02-23 NOTE — Progress Notes (Unsigned)
Patient ID: Lucas [name], male   DOB: 1950/11/13, 62 y.o.   MRN: [id number] CMIS Documentation from [place]:  From:  Eunice Blase LPN LPN [[facility]]   To:  Golden Hurter   Date Sent:  02/23/2013   Subject:  [name] 1950-08-07 (Age: 77 yrs 1 month) - Medicaid [place]-II - [id number] R - Medium (11/13/201   Attachments:    Hi  I conducted home visit with the patient today. The home visit was done outside due to his lives with his sister and she will not allow him to stay in the house when she is not at home. The patient wants to move into his own place ASAP but he has felonies on his record. I did give him a housing list and I will mail him a section 8 application. He would like for his doctor to write a letter regarding he is unable to work; he is on probation and was assigned to work at [employer] but he stated he was unable to do it because he was afraid when climbing up the ladder. He continues to smoke but will stop on his own w/o medication. I started the process of transfer his medication to [facility] pharmacy so they can be delivered and they will [place]. I also gave him a list of transportation list for 53 and older; he stated he was linked to [facility] He next follow up isn't until 2/15. He states he drinks and smokes marijuana. The risk of stroke w/o medication was emphasized and patient verbalized understanding. The patient c/o memory loss but it seems to effect ADL. KR

## 2013-02-23 NOTE — Telephone Encounter (Signed)
From: Eunice Blase LPN LPN [[employer] [employer]] To: Golden Hurter Date Sent: 02/23/2013 Subject: Lucas [name] Sender 08-12-1950 (Age: 62 yrs 1 month) - Medicaid [place]-II - [id number] R - Medium (11/13/201  I conducted home visit with the patient today. The home visit was done outside due to his lives with his sister and she will not allow him to stay in the house when she is not at home. The patient wants to move into his own place ASAP but he has felonies on his record. I did give him a housing list and I will mail him a section 8 application. He would like for his doctor to write a letter regarding he is unable to work; he is on probation and was assigned to work at [employer] but he stated he was unable to do it because he was afraid when climbing up the ladder. He continues to smoke but will stop on his own w/o medication. I started the process of transfer his medication to [facility] pharmacy so they can be delivered and they will [place]. I also gave him a list of transportation list for 9 and older; he stated he was linked to [facility]. He next follow up isn't until 2/15. He states he drinks and smokes marijuana. The risk of stroke w/o medication was emphasized and patient verbalized understanding. The patient c/o memory loss but it seems to effect ADL. KR

## 2013-02-23 NOTE — Progress Notes (Unsigned)
Patient ID: SHAKER SARKER, male   DOB: 1951-02-08, 62 y.o.   MRN: [id number]  From: Eunice Blase LPN LPN [[employer]] To: Golden Hurter Date Sent: 02/23/2013 Subject: [name] 04-08-1951 (Age: 16 yrs 1 month) - Medicaid [place]-II - [id number] R - Medium (11/13/201  I conducted home visit with the patient today. The home visit was done outside due to his lives with his sister and she will not allow him to stay in the house when she is not at home. The patient wants to move into his own place ASAP but he has felonies on his record. I did give him a housing list and I will mail him a section 8 application. He would like for his doctor to write a letter regarding he is unable to work; he is on probation and was assigned to work at [employer] but he stated he was unable to do it because he was afraid when climbing up the ladder. He continues to smoke but will stop on his own w/o medication. I started the process of transfer his medication to [facility] pharmacy so they can be delivered and they will [place]. I also gave him a list of transportation list for 62 and older; he stated he was linked to [facility]. He next follow up isn't until 2/15. He states he drinks and smokes marijuana. The risk of stroke w/o medication was emphasized and patient verbalized understanding. The patient c/o memory loss but it seems to effect ADL. KR

## 2013-02-24 NOTE — Telephone Encounter (Signed)
Thank you.  I will notify [facility] and inquire regarding his disability status.  Wanted to make sure [facility] was aware of the change of pharmacy.

## 2013-03-14 NOTE — Telephone Encounter (Signed)
Dr. Lynnae January, Mr. Yildiz states he is on disability.  I have requested [facility] to inquire as to what diagnosis he is on for disability.  I could not find anything scan in the chart regarding disability.

## 2013-03-21 ENCOUNTER — Telehealth: Payer: Self-pay | Admitting: Licensed Clinical Social Worker

## 2013-03-21 NOTE — Telephone Encounter (Signed)
Pt states he is on disability for the previous stroke he had.  Pt is still requesting letter stating he can not work.

## 2013-03-21 NOTE — Telephone Encounter (Signed)
Note from [place] care manager: Mr. Lucas [name] also notified me that he is currently homeless and his medications are at his previous residence and he is unable to get his medications. CSW placed call to Mr. Lucas [name].  Left message provided pt with the main number to [facility] and for pt to speak with triage/refill line regarding medication issues.

## 2013-03-21 NOTE — Telephone Encounter (Signed)
Since I do not know him well, is there a way too get documentation, other than his word, that he is on disability? Bc if he is on disability, why does he need letter that he cannot work?

## 2013-03-22 ENCOUNTER — Telehealth: Payer: Self-pay | Admitting: Licensed Clinical Social Worker

## 2013-03-22 NOTE — Telephone Encounter (Signed)
He may make appt with [facility] resident to assess his gait / mobility to address the ladder issue this week / next week rather than waiting for Jan appt with me

## 2013-03-22 NOTE — Telephone Encounter (Signed)
Mr. Schork returned call to [place].  CSW inquired if pt was able to get his medication from sister's home or if he called Triage.  Mr. Rensberger states he was able to get his medications without assistance.  Pt has been requesting letter from PCP stating that he can not work.  CSW inquire as to the need of this letter.  Pt states he has a court date on 03/29/13 and was to complete community service.  However, Mr. Pennachio states he was unable to complete all the community service hours because the work had to be done on a ladder, pt states with balance issues from prior stroke he was unable to complete the hours.  CSW suggested pt obtain a letter from [facility] stating he is on disability, pt states they are aware but want him to get up/down from ladder which he is uncomfortable doing because of balance problems.  CSW informed Mr. Swagerty, will notify PCP.  Pt aware he has an appt with PCP next month.

## 2013-03-22 NOTE — Telephone Encounter (Signed)
Pt has refills on all meds.

## 2013-03-22 NOTE — Telephone Encounter (Signed)
Thank you for clarifying. Yes, we can discuss during his next appt.

## 2013-03-22 NOTE — Telephone Encounter (Signed)
Dr. Lynnae January that is exactly my stance;  I don't understand the need for the letter but wanted to relay all the information from [facility] care manager to you.  Do you want me to have him discuss with you during his next appointment?

## 2013-03-23 NOTE — Telephone Encounter (Signed)
Thank you.  Called Mr. Lucas [name] this morning.  Pt provided with option to schedule earlier.  Pt declined and will maintain his appt with PCP in January.

## 2013-03-28 ENCOUNTER — Encounter: Payer: Medicaid Other | Admitting: Internal Medicine

## 2013-04-25 ENCOUNTER — Encounter: Payer: Self-pay | Admitting: Internal Medicine

## 2013-04-25 ENCOUNTER — Encounter: Payer: Medicaid Other | Admitting: Internal Medicine

## 2013-04-26 ENCOUNTER — Encounter: Payer: Self-pay | Admitting: Licensed Clinical Social Worker

## 2013-04-26 NOTE — Progress Notes (Signed)
Patient ID: Lucas [name], male   DOB: 05-Aug-1950, 63 y.o.   MRN: [id number] CMIS note from [place], [id number] Care Manager. I will defer today, he is incarcerated.

## 2013-06-27 ENCOUNTER — Telehealth: Payer: Self-pay | Admitting: Licensed Clinical Social Worker

## 2013-06-27 NOTE — Telephone Encounter (Signed)
According to [facility] [employer], pt is no longer incarcerated.  CSW placed call to pt's mobile number, pt available.  CSW confirmed Mr. Lucas [name] upcoming appt on 3/31, pt aware and verbalized he was planning to come.

## 2013-06-29 ENCOUNTER — Telehealth: Payer: Self-pay | Admitting: Licensed Clinical Social Worker

## 2013-06-29 NOTE — Telephone Encounter (Signed)
CSW recv'd call from Rachael Darby, [facility] home [facility] agency.  Hinton Dyer stating Mr. Smedberg is requesting home health services.  CSW clarified HH vs PCS.  Pt requesting PCS with this agency.  CSW informed agency, once pt's attends appointment request will be discussed at that time.

## 2013-07-11 ENCOUNTER — Encounter: Payer: Medicaid Other | Admitting: Internal Medicine

## 2013-08-08 ENCOUNTER — Encounter: Payer: Self-pay | Admitting: Licensed Clinical Social Worker

## 2013-08-08 NOTE — Progress Notes (Signed)
Patient ID: Lucas [name], male   DOB: 09/29/1950, 63 y.o.   MRN: [id number] CSW received PCS request from [facility] agency.  No return fax # or agency contact on chart.  Unable to process request at this time as pt has not seen physician within the last 90 days.

## 2014-06-12 ENCOUNTER — Encounter: Payer: Self-pay | Admitting: Family Medicine

## 2014-06-12 ENCOUNTER — Telehealth: Payer: Self-pay | Admitting: Family Medicine

## 2014-06-12 ENCOUNTER — Ambulatory Visit: Payer: Medicaid Other | Attending: Family Medicine | Admitting: Family Medicine

## 2014-06-12 ENCOUNTER — Telehealth: Payer: Self-pay | Admitting: *Deleted

## 2014-06-12 VITALS — BP 204/88 | HR 74 | Temp 98.1°F | Resp 16 | Ht 65.0 in | Wt 153.0 lb

## 2014-06-12 DIAGNOSIS — I63312 Cerebral infarction due to thrombosis of left middle cerebral artery: Secondary | ICD-10-CM

## 2014-06-12 DIAGNOSIS — F17200 Nicotine dependence, unspecified, uncomplicated: Secondary | ICD-10-CM | POA: Insufficient documentation

## 2014-06-12 DIAGNOSIS — E785 Hyperlipidemia, unspecified: Secondary | ICD-10-CM

## 2014-06-12 DIAGNOSIS — Z720 Tobacco use: Secondary | ICD-10-CM

## 2014-06-12 DIAGNOSIS — I10 Essential (primary) hypertension: Secondary | ICD-10-CM

## 2014-06-12 MED ORDER — ASPIRIN 81 MG PO TABS: 81.0000 mg | ORAL_TABLET | Freq: Every day | ORAL | Status: DC

## 2014-06-12 MED ORDER — AMLODIPINE BESYLATE 10 MG PO TABS: 10.0000 mg | ORAL_TABLET | Freq: Every day | ORAL | Status: DC

## 2014-06-12 MED ORDER — ATORVASTATIN CALCIUM 40 MG PO TABS: 40.0000 mg | ORAL_TABLET | Freq: Every day | ORAL | Status: DC

## 2014-06-12 MED ORDER — CLONIDINE HCL 0.1 MG PO TABS
0.1000 mg | ORAL_TABLET | Freq: Once | ORAL | Status: AC
  Administered 2014-06-12: 0.1 mg via ORAL

## 2014-06-12 MED ORDER — LISINOPRIL-HYDROCHLOROTHIAZIDE 20-12.5 MG PO TABS: 1.0000 | ORAL_TABLET | Freq: Every day | ORAL | Status: DC

## 2014-06-12 NOTE — Telephone Encounter (Signed)
Pt stated he can not come back till neck week

## 2014-06-12 NOTE — Assessment & Plan Note (Signed)
High cholesterol: Checking lipids lipitor 40 mg daily

## 2014-06-12 NOTE — Patient Instructions (Signed)
Lucas [name],  Thank you for coming in today. It was a pleasure meeting you. I look forward to being your primary doctor.   1. HTN: BP goal < 150/90 Plan Prinzide norvasc DASH diet Bed rest for one week   2. High cholesterol: Checking lipids lipitor 40 mg daily   3. History of stroke: Restart aspirin 81 mg daily Work on quitting smoking  F/u in one week with nurse for BP check  F/u in 3 months with me for HTN  Dr. Adrian Blackwater

## 2014-06-12 NOTE — Telephone Encounter (Signed)
Please call patient, I forgot to obtain CMP when he was in the office. CMP needed ASAP.  Thanks,  Dr. Adrian Blackwater

## 2014-06-12 NOTE — Telephone Encounter (Signed)
error 

## 2014-06-12 NOTE — Progress Notes (Signed)
Patient here to establish care and see doctor after experiencing a stroke 4 or 5 years ago. Patient here after telling the social worker that he has a history of having a stroke and that he "is not up to par." Patient states that the social worker scheduled this appointment  Patient states he cannot afford his medication, so he has not taken any in over a year.

## 2014-06-12 NOTE — Assessment & Plan Note (Signed)
History of stroke: Restart aspirin 81 mg daily Work on quitting smoking

## 2014-06-12 NOTE — Progress Notes (Signed)
   Subjective:    Patient ID: Lucas [name], male    DOB: Apr 16, 1950, 64 y.o.   MRN: [id number] CC: establish care  HPI 64 year old male presents to establish care discussed the following:  1. CHRONIC HYPERTENSION  Disease Monitoring  Blood pressure range: not checking   Chest pain: no   Dyspnea: no   Claudication: no   Medication compliance: no  Medication Side Effects  Lightheadedness: no   Urinary frequency: no   Edema: no   Impotence: no   Preventitive Healthcare:  Exercise: no   2. Hx of CVA in 2011. L parcentral pons. No deficits. Patient not taking ASA, statin or antihypertensives. Denies focal weakness or numbness.   Soc hx: 3 cigs per day since age 44. ETOH 16-24 oz every 3-4 days.  Review of Systems As per HPI     Objective:   Physical Exam BP 204/88 mmHg  Pulse 74  Temp(Src) 98.1 F (36.7 C)  Resp 16  Ht 5\' 5"  (1.651 m)  Wt 153 lb (69.4 kg)  BMI 25.46 kg/m2  SpO2 98% General appearance: alert, cooperative and no distress  Neck: no adenopathy, no carotid bruit, supple, symmetrical, trachea midline and thyroid not enlarged, symmetric, no tenderness/mass/nodules Lungs: clear to auscultation bilaterally Heart: regular rate and rhythm, S1, S2 normal, no murmur, click, rub or gallop Extremities: extremities normal, atraumatic, no cyanosis or edema  Treated with clonidine in office a      Assessment & Plan:

## 2014-06-12 NOTE — Assessment & Plan Note (Signed)
1. HTN: BP goal < 150/90 Plan Prinzide norvasc DASH diet Bed rest for one week

## 2014-06-14 ENCOUNTER — Telehealth: Payer: Self-pay | Admitting: Family Medicine

## 2014-06-14 NOTE — Telephone Encounter (Signed)
Sirrell  Case manager from [employer] called requesting a letter for disability services for intensive labor. Pt has community service and would need this letter for 06/19/2014. If any further question please call her at [phone] or contact the pt directly.

## 2014-06-14 NOTE — Telephone Encounter (Signed)
Expand All Collapse All   Sirrell Case manager from [employer] called requesting a letter for disability services for intensive labor. Pt has community service and would need this letter for 06/19/2014. If any further question please call her at [phone] or contact the pt directly.       Please f/u

## 2014-06-15 ENCOUNTER — Telehealth: Payer: Self-pay | Admitting: Emergency Medicine

## 2014-06-15 NOTE — Telephone Encounter (Signed)
Pt informed requested letter will be at front desk for pick up Pt will be here Tuesday

## 2014-06-15 NOTE — Telephone Encounter (Signed)
Please call back to patient directly.  Letter written and placed up front for pick up

## 2014-06-15 NOTE — Telephone Encounter (Signed)
Lucas [name] is calling to inquire about the letter mentioned below. Please follow up either with Lucas or with the patient.

## 2015-04-24 ENCOUNTER — Emergency Department (HOSPITAL_COMMUNITY): Payer: Medicaid Other

## 2015-04-24 ENCOUNTER — Encounter (HOSPITAL_COMMUNITY): Payer: Self-pay

## 2015-04-24 ENCOUNTER — Emergency Department (HOSPITAL_COMMUNITY)
Admission: EM | Admit: 2015-04-24 | Discharge: 2015-04-24 | Disposition: A | Discharge status: Home / Self Care | Payer: Medicaid Other | Attending: Emergency Medicine | Admitting: Emergency Medicine

## 2015-04-24 DIAGNOSIS — Z8673 Personal history of transient ischemic attack (TIA), and cerebral infarction without residual deficits: Secondary | ICD-10-CM | POA: Diagnosis not present

## 2015-04-24 DIAGNOSIS — I10 Essential (primary) hypertension: Secondary | ICD-10-CM | POA: Insufficient documentation

## 2015-04-24 DIAGNOSIS — F1410 Cocaine abuse, uncomplicated: Secondary | ICD-10-CM | POA: Diagnosis not present

## 2015-04-24 DIAGNOSIS — E785 Hyperlipidemia, unspecified: Secondary | ICD-10-CM | POA: Insufficient documentation

## 2015-04-24 DIAGNOSIS — F1210 Cannabis abuse, uncomplicated: Secondary | ICD-10-CM | POA: Insufficient documentation

## 2015-04-24 DIAGNOSIS — R200 Anesthesia of skin: Secondary | ICD-10-CM | POA: Insufficient documentation

## 2015-04-24 DIAGNOSIS — F17210 Nicotine dependence, cigarettes, uncomplicated: Secondary | ICD-10-CM | POA: Insufficient documentation

## 2015-04-24 LAB — CBC WITH DIFFERENTIAL/PLATELET
BASOS ABS: 0 10*3/uL (ref 0.0–0.1)
BASOS PCT: 0 %
EOS ABS: 0.1 10*3/uL (ref 0.0–0.7)
EOS PCT: 1 %
HCT: 43.4 % (ref 39.0–52.0)
Hemoglobin: 14.3 g/dL (ref 13.0–17.0)
LYMPHS PCT: 15 %
Lymphs Abs: 1.4 10*3/uL (ref 0.7–4.0)
MCH: 28.2 pg (ref 26.0–34.0)
MCHC: 32.9 g/dL (ref 30.0–36.0)
MCV: 85.6 fL (ref 78.0–100.0)
Monocytes Absolute: 0.8 10*3/uL (ref 0.1–1.0)
Monocytes Relative: 8 %
Neutro Abs: 7.2 10*3/uL (ref 1.7–7.7)
Neutrophils Relative %: 76 %
PLATELETS: 248 10*3/uL (ref 150–400)
RBC: 5.07 MIL/uL (ref 4.22–5.81)
RDW: 14.7 % (ref 11.5–15.5)
WBC: 9.4 10*3/uL (ref 4.0–10.5)

## 2015-04-24 LAB — RAPID URINE DRUG SCREEN, HOSP PERFORMED
Amphetamines: NOT DETECTED
BARBITURATES: NOT DETECTED
Benzodiazepines: NOT DETECTED
COCAINE: POSITIVE — AB
OPIATES: NOT DETECTED
Tetrahydrocannabinol: POSITIVE — AB

## 2015-04-24 LAB — BASIC METABOLIC PANEL
Anion gap: 9 (ref 5–15)
BUN: 14 mg/dL (ref 6–20)
CALCIUM: 9.2 mg/dL (ref 8.9–10.3)
CO2: 27 mmol/L (ref 22–32)
CREATININE: 1.29 mg/dL — AB (ref 0.61–1.24)
Chloride: 109 mmol/L (ref 101–111)
GFR calc non Af Amer: 58 mL/min — ABNORMAL LOW (ref >60)
Glucose, Bld: 63 mg/dL — ABNORMAL LOW (ref 65–99)
Potassium: 3.4 mmol/L — ABNORMAL LOW (ref 3.5–5.1)
SODIUM: 145 mmol/L (ref 135–145)

## 2015-04-24 LAB — ETHANOL: Alcohol, Ethyl (B): <5 mg/dL (ref <5)

## 2015-04-24 NOTE — ED Notes (Signed)
Pt presents with NAD. 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 Pt in their custody. Pt states he has HX of HTN has not been on medication  In several years. Pt c/o of numbness to right side since 3am. Pt ambulatory to stretcher. Walked by TIm S RN.

## 2015-04-24 NOTE — ED Notes (Signed)
MD at bedside. EDP FLOYD PRESENT

## 2015-04-24 NOTE — ED Notes (Signed)
Patient transported to MRI 
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
## 2015-05-30 DIAGNOSIS — Z139 Encounter for screening, unspecified: Secondary | ICD-10-CM

## 2015-06-10 NOTE — Congregational Nurse Program (Signed)
Congregational Nurse Program Note  Date of Encounter: 05/30/2015  Past Medical History: Past Medical History  Diagnosis Date  . Hypertension 11/2010    Diagnosed during acute CVA hospitalization  . Hyperlipidemia 01/06/2011    Diagnosed during acute CVA hospitalization. On statin.  . Polysubstance abuse 01/06/2011    cocaine, tobacco, THC  . CVA (cerebral infarction) 11/2010    CT 8/12 : Sm ischemic infarct posterior limb of L internal capsule. Carotids nl. ECHO nl with EF 55-60%. R sided weakness.     Encounter Details:     CNP Questionnaire - 05/30/15 1938    Patient Demographics   Is this a new or existing patient? New   Patient is considered a/an Not Applicable   Race African-American/Black   Patient Assistance   Location of Patient Assistance Not Applicable   Patient's financial/insurance status Low Income;Medicaid;Medicare   Uninsured Patient No   Patient referred to apply for the following financial assistance Not Applicable   Food insecurities addressed Provided food supplies   Transportation assistance No   Assistance securing medications No   Educational health offerings Chronic disease;Cardiac disease;Hypertension   Encounter Details   Primary purpose of visit Chronic Illness/Condition Visit;Education/Health Concerns   Was an Emergency Department visit averted? Not Applicable   Does patient have a medical provider? Yes   Patient referred to Not Applicable   Was a mental health screening completed? (GAINS tool) No   Does patient have dental issues? No   Does patient have vision issues? No   Since previous encounter, have you referred patient for abnormal blood pressure that resulted in a new diagnosis or medication change? No   Since previous encounter, have you referred patient for abnormal blood glucose that resulted in a new diagnosis or medication change? No   For Abstraction Use Only   Does patient have insurance? Yes       B/P check

## 2017-05-24 ENCOUNTER — Encounter (HOSPITAL_COMMUNITY): Payer: Self-pay | Admitting: Emergency Medicine

## 2017-05-24 ENCOUNTER — Emergency Department (HOSPITAL_COMMUNITY): Payer: Medicaid Other

## 2017-05-24 ENCOUNTER — Inpatient Hospital Stay (HOSPITAL_COMMUNITY)
Admission: EM | Admit: 2017-05-24 | Discharge: 2017-06-03 | DRG: 065 | Disposition: A | Discharge status: Skilled Nursing Facility | Payer: Medicaid Other | Attending: Family Medicine | Admitting: Family Medicine

## 2017-05-24 DIAGNOSIS — I6521 Occlusion and stenosis of right carotid artery: Secondary | ICD-10-CM | POA: Diagnosis present

## 2017-05-24 DIAGNOSIS — Z23 Encounter for immunization: Secondary | ICD-10-CM | POA: Diagnosis not present

## 2017-05-24 DIAGNOSIS — I639 Cerebral infarction, unspecified: Secondary | ICD-10-CM | POA: Diagnosis present

## 2017-05-24 DIAGNOSIS — F10129 Alcohol abuse with intoxication, unspecified: Secondary | ICD-10-CM | POA: Diagnosis not present

## 2017-05-24 DIAGNOSIS — F1010 Alcohol abuse, uncomplicated: Secondary | ICD-10-CM | POA: Diagnosis present

## 2017-05-24 DIAGNOSIS — E785 Hyperlipidemia, unspecified: Secondary | ICD-10-CM | POA: Diagnosis present

## 2017-05-24 DIAGNOSIS — I161 Hypertensive emergency: Secondary | ICD-10-CM | POA: Diagnosis present

## 2017-05-24 DIAGNOSIS — Z91018 Allergy to other foods: Secondary | ICD-10-CM

## 2017-05-24 DIAGNOSIS — I5032 Chronic diastolic (congestive) heart failure: Secondary | ICD-10-CM | POA: Diagnosis not present

## 2017-05-24 DIAGNOSIS — I10 Essential (primary) hypertension: Secondary | ICD-10-CM

## 2017-05-24 DIAGNOSIS — I442 Atrioventricular block, complete: Secondary | ICD-10-CM | POA: Diagnosis present

## 2017-05-24 DIAGNOSIS — F17210 Nicotine dependence, cigarettes, uncomplicated: Secondary | ICD-10-CM | POA: Diagnosis present

## 2017-05-24 DIAGNOSIS — F1410 Cocaine abuse, uncomplicated: Secondary | ICD-10-CM | POA: Diagnosis present

## 2017-05-24 DIAGNOSIS — I6381 Other cerebral infarction due to occlusion or stenosis of small artery: Secondary | ICD-10-CM | POA: Diagnosis not present

## 2017-05-24 DIAGNOSIS — R29810 Facial weakness: Secondary | ICD-10-CM | POA: Diagnosis present

## 2017-05-24 DIAGNOSIS — I63312 Cerebral infarction due to thrombosis of left middle cerebral artery: Secondary | ICD-10-CM | POA: Diagnosis not present

## 2017-05-24 DIAGNOSIS — F17200 Nicotine dependence, unspecified, uncomplicated: Secondary | ICD-10-CM | POA: Diagnosis not present

## 2017-05-24 DIAGNOSIS — I6601 Occlusion and stenosis of right middle cerebral artery: Secondary | ICD-10-CM | POA: Diagnosis not present

## 2017-05-24 DIAGNOSIS — I130 Hypertensive heart and chronic kidney disease with heart failure and stage 1 through stage 4 chronic kidney disease, or unspecified chronic kidney disease: Secondary | ICD-10-CM | POA: Diagnosis present

## 2017-05-24 DIAGNOSIS — I4581 Long QT syndrome: Secondary | ICD-10-CM | POA: Diagnosis present

## 2017-05-24 DIAGNOSIS — G8191 Hemiplegia, unspecified affecting right dominant side: Secondary | ICD-10-CM | POA: Diagnosis present

## 2017-05-24 DIAGNOSIS — Z9114 Patient's other noncompliance with medication regimen: Secondary | ICD-10-CM

## 2017-05-24 DIAGNOSIS — N184 Chronic kidney disease, stage 4 (severe): Secondary | ICD-10-CM | POA: Diagnosis present

## 2017-05-24 DIAGNOSIS — Z590 Homelessness: Secondary | ICD-10-CM | POA: Diagnosis not present

## 2017-05-24 DIAGNOSIS — F1910 Other psychoactive substance abuse, uncomplicated: Secondary | ICD-10-CM | POA: Diagnosis present

## 2017-05-24 DIAGNOSIS — J9690 Respiratory failure, unspecified, unspecified whether with hypoxia or hypercapnia: Secondary | ICD-10-CM

## 2017-05-24 DIAGNOSIS — N179 Acute kidney failure, unspecified: Secondary | ICD-10-CM | POA: Diagnosis present

## 2017-05-24 DIAGNOSIS — R29706 NIHSS score 6: Secondary | ICD-10-CM | POA: Diagnosis present

## 2017-05-24 DIAGNOSIS — I361 Nonrheumatic tricuspid (valve) insufficiency: Secondary | ICD-10-CM | POA: Diagnosis not present

## 2017-05-24 LAB — URINALYSIS, MICROSCOPIC (REFLEX)
Bacteria, UA: NONE SEEN
Squamous Epithelial / LPF: NONE SEEN

## 2017-05-24 LAB — COMPREHENSIVE METABOLIC PANEL
ALK PHOS: 70 U/L (ref 38–126)
ALK PHOS: 77 U/L (ref 38–126)
ALT: 7 U/L — ABNORMAL LOW (ref 17–63)
ALT: 8 U/L — AB (ref 17–63)
ANION GAP: 12 (ref 5–15)
AST: 13 U/L — ABNORMAL LOW (ref 15–41)
AST: 13 U/L — AB (ref 15–41)
Albumin: 2.9 g/dL — ABNORMAL LOW (ref 3.5–5.0)
Albumin: 3.1 g/dL — ABNORMAL LOW (ref 3.5–5.0)
Anion gap: 15 (ref 5–15)
BILIRUBIN TOTAL: 0.6 mg/dL (ref 0.3–1.2)
BUN: 25 mg/dL — ABNORMAL HIGH (ref 6–20)
BUN: 24 mg/dL — AB (ref 6–20)
CALCIUM: 8.5 mg/dL — AB (ref 8.9–10.3)
CO2: 22 mmol/L (ref 22–32)
CO2: 20 mmol/L — ABNORMAL LOW (ref 22–32)
CREATININE: 3.01 mg/dL — AB (ref 0.61–1.24)
Calcium: 8.8 mg/dL — ABNORMAL LOW (ref 8.9–10.3)
Chloride: 107 mmol/L (ref 101–111)
Chloride: 108 mmol/L (ref 101–111)
Creatinine, Ser: 3.1 mg/dL — ABNORMAL HIGH (ref 0.61–1.24)
GFR calc Af Amer: 23 mL/min — ABNORMAL LOW (ref >60)
GFR calc Af Amer: 24 mL/min — ABNORMAL LOW (ref >60)
GFR calc non Af Amer: 20 mL/min — ABNORMAL LOW (ref >60)
GFR, EST NON AFRICAN AMERICAN: 20 mL/min — AB (ref >60)
Glucose, Bld: 87 mg/dL (ref 65–99)
Glucose, Bld: 84 mg/dL (ref 65–99)
Potassium: 4.1 mmol/L (ref 3.5–5.1)
Potassium: 4 mmol/L (ref 3.5–5.1)
Sodium: 141 mmol/L (ref 135–145)
Sodium: 143 mmol/L (ref 135–145)
Total Bilirubin: 0.7 mg/dL (ref 0.3–1.2)
Total Protein: 6.2 g/dL — ABNORMAL LOW (ref 6.5–8.1)
Total Protein: 6.8 g/dL (ref 6.5–8.1)

## 2017-05-24 LAB — DIFFERENTIAL
Basophils Absolute: 0 10*3/uL (ref 0.0–0.1)
Basophils Relative: 0 %
EOS ABS: 0.1 10*3/uL (ref 0.0–0.7)
EOS PCT: 1 %
LYMPHS PCT: 15 %
Lymphs Abs: 1.2 10*3/uL (ref 0.7–4.0)
MONO ABS: 0.4 10*3/uL (ref 0.1–1.0)
Monocytes Relative: 5 %
Neutro Abs: 6.3 10*3/uL (ref 1.7–7.7)
Neutrophils Relative %: 79 %

## 2017-05-24 LAB — CBC
HCT: 31.8 % — ABNORMAL LOW (ref 39.0–52.0)
HEMOGLOBIN: 10.2 g/dL — AB (ref 13.0–17.0)
MCH: 25.9 pg — AB (ref 26.0–34.0)
MCHC: 32.1 g/dL (ref 30.0–36.0)
MCV: 80.7 fL (ref 78.0–100.0)
PLATELETS: 227 10*3/uL (ref 150–400)
RBC: 3.94 MIL/uL — AB (ref 4.22–5.81)
RDW: 15.8 % — ABNORMAL HIGH (ref 11.5–15.5)
WBC: 8 10*3/uL (ref 4.0–10.5)

## 2017-05-24 LAB — PROTIME-INR
INR: 1.1
INR: 1.07
PROTHROMBIN TIME: 14.1 s (ref 11.4–15.2)
Prothrombin Time: 13.8 seconds (ref 11.4–15.2)

## 2017-05-24 LAB — URINALYSIS, ROUTINE W REFLEX MICROSCOPIC
Bilirubin Urine: NEGATIVE
Glucose, UA: 50 mg/dL — AB
KETONES UR: 5 mg/dL — AB
LEUKOCYTES UA: NEGATIVE
NITRITE: NEGATIVE
PH: 6 (ref 5.0–8.0)
Protein, ur: 100 mg/dL — AB
Specific Gravity, Urine: 1.014 (ref 1.005–1.030)

## 2017-05-24 LAB — ETHANOL: Alcohol, Ethyl (B): <10 mg/dL (ref <10)

## 2017-05-24 LAB — RAPID URINE DRUG SCREEN, HOSP PERFORMED
Amphetamines: NOT DETECTED
Barbiturates: NOT DETECTED
Benzodiazepines: NOT DETECTED
Cocaine: POSITIVE — AB
OPIATES: NOT DETECTED
Tetrahydrocannabinol: NOT DETECTED

## 2017-05-24 LAB — I-STAT CHEM 8, ED
BUN: 27 mg/dL — AB (ref 6–20)
CALCIUM ION: 1.09 mmol/L — AB (ref 1.15–1.40)
Chloride: 106 mmol/L (ref 101–111)
Creatinine, Ser: 3 mg/dL — ABNORMAL HIGH (ref 0.61–1.24)
Glucose, Bld: 85 mg/dL (ref 65–99)
HEMATOCRIT: 33 % — AB (ref 39.0–52.0)
HEMOGLOBIN: 11.2 g/dL — AB (ref 13.0–17.0)
Potassium: 4 mmol/L (ref 3.5–5.1)
SODIUM: 142 mmol/L (ref 135–145)
TCO2: 24 mmol/L (ref 22–32)

## 2017-05-24 LAB — TROPONIN I
Troponin I: 0.04 ng/mL (ref <0.03)
Troponin I: 0.08 ng/mL (ref <0.03)

## 2017-05-24 LAB — APTT
aPTT: 43 seconds — ABNORMAL HIGH (ref 24–36)
aPTT: 49 seconds — ABNORMAL HIGH (ref 24–36)

## 2017-05-24 LAB — GLUCOSE, CAPILLARY: Glucose-Capillary: 131 mg/dL — ABNORMAL HIGH (ref 65–99)

## 2017-05-24 LAB — PHOSPHORUS: Phosphorus: 3.6 mg/dL (ref 2.5–4.6)

## 2017-05-24 LAB — I-STAT TROPONIN, ED: TROPONIN I, POC: 0.02 ng/mL (ref 0.00–0.08)

## 2017-05-24 LAB — MRSA PCR SCREENING: MRSA by PCR: NEGATIVE

## 2017-05-24 LAB — MAGNESIUM: MAGNESIUM: 2.1 mg/dL (ref 1.7–2.4)

## 2017-05-24 MED ORDER — INFLUENZA VAC SPLIT QUAD 0.5 ML IM SUSY
0.5000 mL | PREFILLED_SYRINGE | INTRAMUSCULAR | Status: AC | PRN
  Administered 2017-05-30: 0.5 mL via INTRAMUSCULAR
  Filled 2017-05-24: qty 0.5

## 2017-05-24 MED ORDER — SODIUM CHLORIDE 0.9 % IV SOLN: 250.0000 mL | INTRAVENOUS | Status: DC | PRN

## 2017-05-24 MED ORDER — NICARDIPINE HCL IN NACL 20-0.86 MG/200ML-% IV SOLN
3.0000 mg/h | Freq: Once | INTRAVENOUS | Status: AC
  Administered 2017-05-24: 3 mg/h via INTRAVENOUS
  Filled 2017-05-24: qty 200

## 2017-05-24 MED ORDER — ATORVASTATIN CALCIUM 40 MG PO TABS
40.0000 mg | ORAL_TABLET | Freq: Every day | ORAL | Status: DC
Start: 2017-05-24 — End: 2017-06-03
  Administered 2017-05-24 – 2017-06-02 (×10): 40 mg via ORAL
  Filled 2017-05-24 (×11): qty 1

## 2017-05-24 MED ORDER — AMLODIPINE BESYLATE 5 MG PO TABS
5.0000 mg | ORAL_TABLET | Freq: Every day | ORAL | Status: DC
  Administered 2017-05-24 – 2017-05-25 (×2): 5 mg via ORAL
  Filled 2017-05-24 (×2): qty 1

## 2017-05-24 MED ORDER — SODIUM CHLORIDE 0.9 % IV SOLN
INTRAVENOUS | Status: DC
  Administered 2017-05-24 – 2017-05-26 (×2): via INTRAVENOUS

## 2017-05-24 MED ORDER — THIAMINE HCL 100 MG/ML IJ SOLN
100.0000 mg | Freq: Every day | INTRAMUSCULAR | Status: DC
Start: 2017-05-24 — End: 2017-05-26
  Administered 2017-05-24 – 2017-05-25 (×2): 100 mg via INTRAVENOUS
  Filled 2017-05-24 (×3): qty 1

## 2017-05-24 MED ORDER — FOLIC ACID 5 MG/ML IJ SOLN
1.0000 mg | Freq: Every day | INTRAMUSCULAR | Status: DC
  Administered 2017-05-24 – 2017-05-25 (×2): 1 mg via INTRAVENOUS
  Filled 2017-05-24 (×4): qty 0.2

## 2017-05-24 MED ORDER — NICARDIPINE HCL IN NACL 20-0.86 MG/200ML-% IV SOLN
3.0000 mg/h | INTRAVENOUS | Status: DC
  Administered 2017-05-24: 8 mg/h via INTRAVENOUS
  Administered 2017-05-24: 10.5 mg/h via INTRAVENOUS
  Administered 2017-05-24 – 2017-05-25 (×3): 3 mg/h via INTRAVENOUS
  Administered 2017-05-25: 5 mg/h via INTRAVENOUS
  Administered 2017-05-25: 3 mg/h via INTRAVENOUS
  Administered 2017-05-26: 4 mg/h via INTRAVENOUS
  Filled 2017-05-24 (×10): qty 200

## 2017-05-24 MED ORDER — CALCIUM GLUCONATE 10 % IV SOLN
1.0000 g | Freq: Once | INTRAVENOUS | Status: AC
  Administered 2017-05-24: 1 g via INTRAVENOUS
  Filled 2017-05-24: qty 10

## 2017-05-24 NOTE — Consult Note (Signed)
Requesting Physician: Dr. Nelda Marseille    Chief Complaint: Stroke  History obtained from:  Patient    HPI:                                                                                                                                         Lucas [name] is an 67 y.o. male who lives in a hotel.  Patient came to [facility] ED on 2/11 with complaint of right-sided weakness that began a day prior.  Patient admits to having hypertension but not taking his meds as prescribed.  He takes his meds when he thinks he needs him.  Hypertensive the blood pressure of 250/82.  Patient was urine drug positive for cocaine however he does not admit to using cocaine.  CT head demonstrated a new lacunar infarct in the left corona radiata and superior basal ganglia.  Patient states usually can use his right side however at this point he cannot use his right side.  Date last known well: Date: 05/23/2017 Time last known well: Unable to determine tPA Given: No: Out of the window NIH stroke scale 6 Modified Rankin: Rankin Score=0  Past Medical History:  Diagnosis Date  . CVA (cerebral infarction) 11/2010   CT 8/12 : Sm ischemic infarct posterior limb of L internal capsule. Carotids nl. ECHO nl with EF 55-60%. R sided weakness.   . Hyperlipidemia 01/06/2011   Diagnosed during acute CVA hospitalization. On statin.  Marland Kitchen Hypertension 11/2010   Diagnosed during acute CVA hospitalization  . Polysubstance abuse ([place]) 01/06/2011   cocaine, tobacco, THC   History reviewed. No pertinent surgical history.  Family History  Problem Relation Age of Onset  . Hypertension Mother   . Hypertension Father    Social History:  reports that he has been smoking cigarettes.  He started smoking about 52 years ago. He has a 8.00 pack-year smoking history. he has never used smokeless tobacco. He reports that he drinks about 1.2 oz of alcohol per week. He reports that he does not use drugs.  Allergies:  Allergies  Allergen Reactions  .  Pork-Derived Products Other (See Comments)    Patient does not eat pork products.    Medications:  Current Facility-Administered Medications  Medication Dose Route Frequency Provider Last Rate Last Dose  . 0.9 %  sodium chloride infusion  250 mL Intravenous PRN Rush Farmer, MD      . amLODipine (NORVASC) tablet 5 mg  5 mg Oral Daily Rush Farmer, MD      . atorvastatin (LIPITOR) tablet 40 mg  40 mg Oral q1800 Desai, Rahul P, PA-C      . calcium gluconate 1 g in sodium chloride 0.9 % 100 mL IVPB  1 g Intravenous Once Desai, Rahul P, PA-C      . folic acid injection 1 mg  1 mg Intravenous Daily Desai, Rahul P, PA-C      . nicardipine (CARDENE) 20mg  in 0.86% saline 281ml IV infusion (0.1 mg/ml)  3-15 mg/hr Intravenous Continuous Rush Farmer, MD      . thiamine (B-1) injection 100 mg  100 mg Intravenous Daily Desai, Rahul P, PA-C       Current Outpatient Medications  Medication Sig Dispense Refill  . amLODipine (NORVASC) 10 MG tablet Take 1 tablet (10 mg total) by mouth daily. (Patient not taking: Reported on 04/24/2015) 90 tablet 3  . aspirin 81 MG tablet Take 1 tablet (81 mg total) by mouth daily. (Patient not taking: Reported on 04/24/2015) 90 tablet 3  . atorvastatin (LIPITOR) 40 MG tablet Take 1 tablet (40 mg total) by mouth daily. (Patient not taking: Reported on 04/24/2015) 90 tablet 3  . lisinopril-hydrochlorothiazide (ZESTORETIC) 20-12.5 MG per tablet Take 1 tablet by mouth daily. (Patient not taking: Reported on 04/24/2015) 90 tablet 3     ROS:                                                                                                                                       History obtained from the patient  General ROS: negative for - chills, fatigue, fever, night sweats, weight gain or weight loss Psychological ROS: negative for - , hallucinations,  memory difficulties, mood swings or  Ophthalmic ROS: negative for - blurry vision, double vision, eye pain or loss of vision ENT ROS: negative for - epistaxis, nasal discharge, oral lesions, sore throat, tinnitus or vertigo Respiratory ROS: negative for - cough,  shortness of breath or wheezing Cardiovascular ROS: negative for - chest pain, dyspnea on exertion,  Gastrointestinal ROS: negative for - abdominal pain, diarrhea,  nausea/vomiting or stool incontinence Genito-Urinary ROS: negative for - dysuria, hematuria, incontinence or urinary frequency/urgency Musculoskeletal ROS: negative for - joint swelling or muscular weakness Neurological ROS: as noted in HPI  General Examination:  Blood pressure (!) 225/79, pulse 60, temperature 98 F (36.7 C), resp. rate (!) 24, SpO2 96 %.  HEENT-  Normocephalic, no lesions, without obvious abnormality.  Normal external eye and conjunctiva.   Cardiovascular- S1-S2 audible, pulses palpable throughout   Lungs-no rhonchi or wheezing noted, no excessive working breathing.  Saturations within normal limits Abdomen- All 4 quadrants palpated and nontender Extremities- Warm, dry and intact Musculoskeletal-no joint tenderness, deformity or swelling Skin-warm and dry, no hyperpigmentation, vitiligo, or suspicious lesions  Neurological Examination Mental Status: Alert, oriented, thought content appropriate.  Speech fluent without evidence of aphasia.  Able to follow 3 step commands without difficulty. Cranial Nerves: II:  Visual fields grossly normal,  III,IV, VI: ptosis not present, extra-ocular motions intact bilaterally, pupils equal, round, reactive to light and accommodation V,VII: smile asymmetric with lag on the right, slight right nasolabial fold decreased prominence. Facial light touch sensation normal bilaterally VIII: hearing normal bilaterally IX,X:  uvula rises symmetrically XI: bilateral shoulder shrug XII: midline tongue extension Motor: Right : Upper extremity   2/5    Left:     Upper extremity   5/5  Lower extremity   2/5     Lower extremity   5/5 Tone and bulk:normal tone throughout; no atrophy noted Sensory: Pinprick and light touch stated to be intact throughout, bilaterally Deep Tendon Reflexes: 2+ and symmetric throughout Plantars: Right: downgoing   Left: downgoing Cerebellar: normal finger-to-nose on the left unable to perform on the right,  normal heel-to-shin test on the left unable to participate on the right Gait: Not tested   Lab Results: Basic Metabolic Panel: Recent Labs  Lab 05/24/17 0954 05/24/17 1016  NA 141 142  K 4.1 4.0  CL 107 106  CO2 22  --   GLUCOSE 87 85  BUN 25* 27*  CREATININE 3.10* 3.00*  CALCIUM 8.5*  --     CBC: Recent Labs  Lab 05/24/17 0954 05/24/17 1016  WBC 8.0  --   NEUTROABS 6.3  --   HGB 10.2* 11.2*  HCT 31.8* 33.0*  MCV 80.7  --   PLT 227  --     Lipid Panel: No results for input(s): CHOL, TRIG, HDL, CHOLHDL, VLDL, LDLCALC in the last 168 hours.  CBG: No results for input(s): GLUCAP in the last 168 hours.  Imaging: Ct Head Wo Contrast  Result Date: 05/24/2017 CLINICAL DATA:  Right-sided weakness beginning yesterday. History of stroke. EXAM: CT HEAD WITHOUT CONTRAST TECHNIQUE: Contiguous axial images were obtained from the base of the skull through the vertex without intravenous contrast. COMPARISON:  Head CT and MRI 04/24/2015 FINDINGS: Brain: There is an interval infarct anteriorly in the left basal ganglia which is chronic. Additional new lacunar infarcts in the left corona radiata and superior left lentiform nucleus region are less well defined and may be acute or subacute (series 2, images 18 and 20). Additional chronic infarcts are again seen involving the right basal ganglia, thalami, and deep cerebral white matter bilaterally. There is mild ex vacuo enlargement  of the frontal horns of both lateral ventricles. There is no evidence of acute large territory infarct, intracranial hemorrhage, mass, midline shift, or extra-axial fluid collection. There is mild cerebral atrophy. Mild periventricular white matter hypodensities bilaterally are compatible with chronic small vessel ischemic disease. Vascular: Calcified atherosclerosis at the skull base. No hyperdense vessel. Skull: No fracture focal osseous lesion. Sinuses/Orbits: Mild posterior left ethmoid air cell opacification. Clear mastoid air cells. Unremarkable orbits. Other: None. IMPRESSION: 1. No evidence  of acute large territory infarct or intracranial hemorrhage. 2. New lacunar infarcts in the left corona radiata/superior basal ganglia, possibly acute or subacute. 3. 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 Electronically Signed   By: Logan Bores M.D.   On: 05/24/2017 11:21   Dg Chest Portable 1 View  Result Date: 05/24/2017 CLINICAL DATA:  Malaise, no other symptoms. The patient is a poor historian. History of hypertension, hyperlipidemia, current smoker. EXAM: PORTABLE CHEST 1 VIEW COMPARISON:  PA and lateral chest x-ray of December 09, 2010 FINDINGS: The lungs are well-expanded. The interstitial markings are mildly prominent but stable. The cardiac silhouette is enlarged. The pulmonary vascularity is slightly more conspicuous today. The trachea is midline. There calcification in the wall of the aortic arch. The bony thorax exhibits no acute abnormality. IMPRESSION: Chronic bronchitic-smoking related changes. Mild cardiomegaly with minimal central pulmonary vascular prominence. This may reflect low-grade CHF. No acute pneumonia. Thoracic aortic atherosclerosis. Electronically Signed   By: David  Martinique M.D.   On: 05/24/2017 10:50    Assessment and plan discussed with with attending physician and they are in agreement.   Etta Quill PA-C [facility] [phone] 05/24/2017, 12:56 PM   Assessment: 67 y.o. male presenting with subacute left corona radiata infarct along with basal ganglia infarct.  This is in the setting of cocaine use (cocaine positive toxicology screen) and malignant hypertension in a patient who is noncompliant with his antihypertensive medications. 1. Exam reveals 2/5 right sided weakness and subtle right facial droop 2. Stroke Risk Factors - cocaine use, HLD, history of prior stroke, hypertension and smoking  Recommend 1. HgbA1c 2. MRI/MRA of the brain without contrast 3. PT consult, OT consult, Speech consult 4. Echocardiogram and carotid ultrasound 5. 80 mg of Atorvastatin 6. Prophylactic therapy-Antiplatelet med: Aspirin 325 mg daily 7. Risk factor modification 8. Telemetry monitoring 9. Frequent neuro checks 10 NPO until passes stroke swallow screen 11. BP management. Out of permissive HTN time window. 12. Tobacco and cocaine cessation counseling 13 please page stroke NP  Or  PA  Or MD from 8am -4 pm  as this patient from this time will be  followed by the stroke.   You can look them up on [email or web address]  Password [email or web address]  Electronically signed: Dr. Kerney Elbe

## 2017-05-24 NOTE — H&P (Addendum)
PULMONARY / CRITICAL CARE [name]   Name: Lucas [name] MRN: [id number] DOB: 27-Nov-1950    ADMISSION DATE:  05/24/2017 CONSULTATION DATE:  05/24/17  REFERRING MD:  Alvino Chapel  CHIEF COMPLAINT:  Right sided weakness  HISTORY OF PRESENT ILLNESS:  Lucas [name] is a 67 y.o. male with PMH as outlined below. He presented to [facility] ED 2/11 with right sided weakness that began 1 day prior.  Symptoms did not resolve; therefore, he came to ED.  In ED, he was found to be hypertensive with presenting BP of 250 / 82.  UDS was positive for cocaine.  CT head demonstrated new lacunar infarcts in left corona and superior basal ganglia along with chronic b/l basal ganglia infarcts.  He was started on cardene gtt, neuro was consulted, and due to cardene, PCCM asked to admit to ICU.  PAST MEDICAL HISTORY :  He  has a past medical history of CVA (cerebral infarction) (11/2010), Hyperlipidemia (01/06/2011), Hypertension (11/2010), and Polysubstance abuse ([place]) (01/06/2011).  PAST SURGICAL HISTORY: He  has no past surgical history on file.  Allergies  Allergen Reactions  . Pork-Derived Products Other (See Comments)    Patient does not eat pork products.    No current facility-administered medications on file prior to encounter.    Current Outpatient Medications on File Prior to Encounter  Medication Sig  . amLODipine (NORVASC) 10 MG tablet Take 1 tablet (10 mg total) by mouth daily. (Patient not taking: Reported on 04/24/2015)  . aspirin 81 MG tablet Take 1 tablet (81 mg total) by mouth daily. (Patient not taking: Reported on 04/24/2015)  . atorvastatin (LIPITOR) 40 MG tablet Take 1 tablet (40 mg total) by mouth daily. (Patient not taking: Reported on 04/24/2015)  . lisinopril-hydrochlorothiazide (ZESTORETIC) 20-12.5 MG per tablet Take 1 tablet by mouth daily. (Patient not taking: Reported on 04/24/2015)    FAMILY HISTORY:  His has no family status information on file.    SOCIAL HISTORY: He  reports that  he has been smoking cigarettes.  He started smoking about 52 years ago. He has a 8.00 pack-year smoking history. he has never used smokeless tobacco. He reports that he drinks about 1.2 oz of alcohol per week. He reports that he does not use drugs.  REVIEW OF SYSTEMS:   All negative; except for those that are bolded, which indicate positives.  Constitutional: weight loss, weight gain, night sweats, fevers, chills, fatigue, weakness.  HEENT: headaches, sore throat, sneezing, nasal congestion, post nasal drip, difficulty swallowing, tooth/dental problems, visual complaints, visual changes, ear aches. Neuro: difficulty with speech, weakness, right sided numbness and weakness, ataxia. CV:  chest pain, orthopnea, PND, swelling in lower extremities, dizziness, palpitations, syncope.  Resp: cough, hemoptysis, dyspnea, wheezing. GI: heartburn, indigestion, abdominal pain, nausea, vomiting, diarrhea, constipation, change in bowel habits, loss of appetite, hematemesis, melena, hematochezia.  GU: dysuria, change in color of urine, urgency or frequency, flank pain, hematuria. MSK: joint pain or swelling, decreased range of motion. Psych: change in mood or affect, depression, anxiety, suicidal ideations, homicidal ideations. Skin: rash, itching, bruising.   SUBJECTIVE:  Has persistent right weakness.  Denies chest pain, SOB, headaches, N/V/D, abd pain.  States last cocaine use was 1 month ago; however, UDS positive for cocaine.  When asked how he got cocaine in his system, he said "I dont know".  VITAL SIGNS: BP (!) 223/80   Pulse 63   Temp 98 F (36.7 C)   Resp (!) 23   SpO2 96%   HEMODYNAMICS:  VENTILATOR SETTINGS:    INTAKE / OUTPUT: No intake/output data recorded.   PHYSICAL EXAMINATION: General: Adult male, appears older than stated age, in NAD. Neuro: A&O x 3, right arm and leg weakness. HEENT: [place]/AT. EOMI, sclerae anicteric. Cardiovascular: RRR, no M/R/G.  Lungs: Respirations  even and unlabored.  CTA bilaterally, No W/R/R. Abdomen: BS x 4, soft, NT/ND.  Musculoskeletal: No gross deformities, no edema.  Skin: Intact, warm, no rashes.  LABS:  BMET Recent Labs  Lab 05/24/17 0954 05/24/17 1016  NA 141 142  K 4.1 4.0  CL 107 106  CO2 22  --   BUN 25* 27*  CREATININE 3.10* 3.00*  GLUCOSE 87 85    Electrolytes Recent Labs  Lab 05/24/17 0954  CALCIUM 8.5*    CBC Recent Labs  Lab 05/24/17 0954 05/24/17 1016  WBC 8.0  --   HGB 10.2* 11.2*  HCT 31.8* 33.0*  PLT 227  --     Coag's Recent Labs  Lab 05/24/17 0954  APTT 43*  INR 1.10    Sepsis Markers No results for input(s): LATICACIDVEN, PROCALCITON, O2SATVEN in the last 168 hours.  ABG No results for input(s): PHART, PCO2ART, PO2ART in the last 168 hours.  Liver Enzymes Recent Labs  Lab 05/24/17 0954  AST 13*  ALT 7*  ALKPHOS 70  BILITOT 0.6  ALBUMIN 2.9*    Cardiac Enzymes No results for input(s): TROPONINI, PROBNP in the last 168 hours.  Glucose No results for input(s): GLUCAP in the last 168 hours.  Imaging Ct Head Wo Contrast  Result Date: 05/24/2017 CLINICAL DATA:  Right-sided weakness beginning yesterday. History of stroke. EXAM: CT HEAD WITHOUT CONTRAST TECHNIQUE: Contiguous axial images were obtained from the base of the skull through the vertex without intravenous contrast. COMPARISON:  Head CT and MRI 04/24/2015 FINDINGS: Brain: There is an interval infarct anteriorly in the left basal ganglia which is chronic. Additional new lacunar infarcts in the left corona radiata and superior left lentiform nucleus region are less well defined and may be acute or subacute (series 2, images 18 and 20). Additional chronic infarcts are again seen involving the right basal ganglia, thalami, and deep cerebral white matter bilaterally. There is mild ex vacuo enlargement of the frontal horns of both lateral ventricles. There is no evidence of acute large territory infarct, intracranial  hemorrhage, mass, midline shift, or extra-axial fluid collection. There is mild cerebral atrophy. Mild periventricular white matter hypodensities bilaterally are compatible with chronic small vessel ischemic disease. Vascular: Calcified atherosclerosis at the skull base. No hyperdense vessel. Skull: No fracture focal osseous lesion. Sinuses/Orbits: Mild posterior left ethmoid air cell opacification. Clear mastoid air cells. Unremarkable orbits. Other: None. IMPRESSION: 1. No evidence of acute large territory infarct or intracranial hemorrhage. 2. New lacunar infarcts in the left corona radiata/superior basal ganglia, possibly acute or subacute. 3. 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 Electronically Signed   By: Logan Bores M.D.   On: 05/24/2017 11:21   Dg Chest Portable 1 View  Result Date: 05/24/2017 CLINICAL DATA:  Malaise, no other symptoms. The patient is a poor historian. History of hypertension, hyperlipidemia, current smoker. EXAM: PORTABLE CHEST 1 VIEW COMPARISON:  PA and lateral chest x-ray of December 09, 2010 FINDINGS: The lungs are well-expanded. The interstitial markings are mildly prominent but stable. The cardiac silhouette is enlarged. The pulmonary vascularity is slightly more conspicuous today. The trachea is midline. There calcification in the wall of the aortic arch. The bony thorax  exhibits no acute abnormality. IMPRESSION: Chronic bronchitic-smoking related changes. Mild cardiomegaly with minimal central pulmonary vascular prominence. This may reflect low-grade CHF. No acute pneumonia. Thoracic aortic atherosclerosis. Electronically Signed   By: David  Martinique M.D.   On: 05/24/2017 10:50     STUDIES:  CT head 2/11 > new lacunar infarcts in left corona and superior basal ganglia along with chronic b/l basal ganglia infarcts. MRI brain 2/11 >  Echo 2/11 >  Carotid dopplers 2/11 >   CULTURES: None.  ANTIBIOTICS: None.  SIGNIFICANT  EVENTS: 2/11 > admit.  LINES/TUBES: None.  DISCUSSION: 67 y.o. male with cocaine abuse, admitted 2/11 with right sided weakness due to new lacunar infarcts.  Also found to have hypertensive emergency with BP 250 / 82.  ASSESSMENT / PLAN:  PULMONARY A: No acute issues. P: No interventions required.  CARDIOVASCULAR A:  Hypertensive emergency - presenting BP 250 / 82.  Exacerbated by cocaine abuse. Hx HTN, HLD. P:  Continue cardene for goal SBP 185 for 25% reduction over next 24 hours. ASA, statin, zestoretic per neuro. Trend troponin.  RENAL A:   AKI. Hypocalcemia. P:   1g Ca gluconate. BMP in AM.  GASTROINTESTINAL A:   Nutrition. P:   NPO. SLP eval.  HEMATOLOGIC A:   VTE Prophylaxis. P:  SCD's. CBC in AM.  INFECTIOUS A:   No indication of infection. P:   Monitor clinically.  ENDOCRINE A: No acute issues. P: No interventions required.  NEUROLOGIC A:   New lacunar infarcts - likely due to HTN / cocaine. Hx CVA, substance abuse (UDS positive cocaine), EtOH abuse (says he drinks whenever he can get hold of EtOH). P:   Neuro following / managing stroke. Stroke workup per neuro. Thiamine / Folate. Substance abuse counseling.   Family updated: None available.  Interdisciplinary Family Meeting v Palliative Care Meeting:  Due by: 05/31/17.  CC time: 30 min.   Lucas [name], Lucas [name] Pager: [phone]  or [phone] 05/24/2017, 12:11 PM  Attending Note:  67 year old male with PMH of polysubstance (cocaine and "whatever else I can get") abuse and etoh abuse who presents to the hospital with hypertensive emergency and a new lacunar infarct with additional old CVAs and positive for cocaine.  Patient does not admit to smoking cocaine, last time a month ago.  On exam, right side is 1/5 weakness and left side is normal.  I reviewed CXR myself, no acute disease noted.  Discussed with neurology and  PCCM-NP.  Will admit to the ICU.  Start cardene drip for SBP target of 185 for 24 hours then normalize after that.  Patient has history of uncontrolled hypertension so doubt will get down to 120's.  SLP.  Labs in AM and replace electrolytes as indicated.  Neurology consult called.  SBP per neurology.  Will hold off heparin and ASA and defer to neuro.  Will place SCD's.  Order echo, EKG and trend troponins.  The patient is critically ill with multiple organ systems failure and requires high complexity decision making for assessment and support, frequent evaluation and titration of therapies, application of advanced monitoring technologies and extensive interpretation of multiple databases.   Critical Care Time devoted to patient care services described in this note is  35  Minutes. This time reflects time of care of this signee Dr Jennet Maduro. This critical care time does not reflect procedure time, or teaching time or supervisory time  of PA/NP/Med student/Med Resident etc but could involve care discussion time.  Rush Farmer, M.D. [facility] Pulmonary/Critical Care [name]. Pager: [phone]. After hours pager: [phone].

## 2017-05-24 NOTE — ED Provider Notes (Signed)
[place] EMERGENCY DEPARTMENT Provider Note   CSN: [id number] Arrival date & time: 05/24/17  4098     History   Chief Complaint Chief Complaint  Patient presents with  . Weakness    HPI Lucas [name] is a 67 y.o. male.  HPI Patient presents with right-sided weakness.  States it began last night.  Cannot really clarify much more than that.  States it is on his right arm and leg.  No headache.  Previous stroke with weakness on the side but per patient states that is gone away.  Has a history of hypertension but is been off his medicines for a couple months now.  Presents with an elevated blood pressure of 250/100.  No chest pain.  No trouble breathing.  No fevers or chills.  States he just has a Marine scientist that he sees but does not have a doctor.  Previous history of substance abuse but states he has not used recently. Past Medical History:  Diagnosis Date  . CVA (cerebral infarction) 11/2010   CT 8/12 : Sm ischemic infarct posterior limb of L internal capsule. Carotids nl. ECHO nl with EF 55-60%. R sided weakness.   . Hyperlipidemia 01/06/2011   Diagnosed during acute CVA hospitalization. On statin.  Marland Kitchen Hypertension 11/2010   Diagnosed during acute CVA hospitalization  . Polysubstance abuse ([place]) 01/06/2011   cocaine, tobacco, THC    Patient Active Problem List   Diagnosis Date Noted  . 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 Tobacco abuse 06/12/2014  . Memory loss or impairment 02/13/2013  . Right shoulder pain 02/21/2011  . Hypertension 01/06/2011  . Hyperlipidemia with target LDL less than 100 01/06/2011  . Polysubstance abuse ([place]) 01/06/2011  . CVA (cerebral infarction) 01/06/2011  . Preventative health care 01/06/2011    History reviewed. No pertinent surgical history.     Home Medications    Prior to Admission medications   Medication Sig Start Date End Date Taking? Authorizing Provider  amLODipine (NORVASC) 10 MG tablet Take 1 tablet (10 mg  total) by mouth daily. Patient not taking: Reported on 04/24/2015 06/12/14   Boykin Nearing, MD  aspirin 81 MG tablet Take 1 tablet (81 mg total) by mouth daily. Patient not taking: Reported on 04/24/2015 06/12/14   Boykin Nearing, MD  atorvastatin (LIPITOR) 40 MG tablet Take 1 tablet (40 mg total) by mouth daily. Patient not taking: Reported on 04/24/2015 06/12/14   Boykin Nearing, MD  lisinopril-hydrochlorothiazide (ZESTORETIC) 20-12.5 MG per tablet Take 1 tablet by mouth daily. Patient not taking: Reported on 04/24/2015 06/12/14   Boykin Nearing, MD    Family History Family History  Problem Relation Age of Onset  . Hypertension Mother   . Hypertension Father     Social History Social History   Tobacco Use  . Smoking status: Current Some Day Smoker    Packs/day: 0.20    Years: 40.00    Pack years: 8.00    Types: Cigarettes    Start date: 01/02/1965  . Smokeless tobacco: Never Used  . Tobacco comment: 2-3 per week  Substance Use Topics  . Alcohol use: Yes    Alcohol/week: 1.2 oz    Types: 2 Standard drinks or equivalent per week    Comment: beer  . Drug use: No     Allergies   Pork-derived products   Review of Systems Review of Systems  Constitutional: Negative for appetite change and fever.  HENT: Negative for congestion.   Respiratory: Negative for shortness of  breath.   Cardiovascular: Negative for chest pain.  Gastrointestinal: Negative for abdominal pain.  Genitourinary: Negative for flank pain.  Musculoskeletal: Negative for back pain.  Neurological: Positive for weakness. Negative for numbness and headaches.  Hematological: Negative for adenopathy.  Psychiatric/Behavioral: Negative for behavioral problems.     Physical Exam Updated Vital Signs BP (!) 192/72   Pulse 64   Temp 98 F (36.7 C)   Resp (!) 22   SpO2 96%   Physical Exam  Constitutional: He appears well-developed.  HENT:  Head: Atraumatic.  Eyes: EOM are normal.  Neck: No thyromegaly  present.  Cardiovascular: Normal rate.  Pulmonary/Chest: Effort normal.  Abdominal: Soft.  Musculoskeletal: He exhibits no edema.  Neurological: He is alert.  Face symmetric.  Eye movements intact.  Decreased strength on right side upper and lower extremity compared to left side.  Hyperreflexive in right lower extremity.  Sensation grossly intact in upper and lower extremities.  Normal speech.  Skin: Skin is warm. Capillary refill takes less than 2 seconds.     ED Treatments / Results  Labs (all labs ordered are listed, but only abnormal results are displayed) Labs Reviewed  APTT - Abnormal; Notable for the following components:      Result Value   aPTT 43 (*)    All other components within normal limits  CBC - Abnormal; Notable for the following components:   RBC 3.94 (*)    Hemoglobin 10.2 (*)    HCT 31.8 (*)    MCH 25.9 (*)    RDW 15.8 (*)    All other components within normal limits  COMPREHENSIVE METABOLIC PANEL - Abnormal; Notable for the following components:   BUN 25 (*)    Creatinine, Ser 3.10 (*)    Calcium 8.5 (*)    Total Protein 6.2 (*)    Albumin 2.9 (*)    AST 13 (*)    ALT 7 (*)    GFR calc non Af Amer 20 (*)    GFR calc Af Amer 23 (*)    All other components within normal limits  RAPID URINE DRUG SCREEN, HOSP PERFORMED - Abnormal; Notable for the following components:   Cocaine POSITIVE (*)    All other components within normal limits  URINALYSIS, ROUTINE W REFLEX MICROSCOPIC - Abnormal; Notable for the following components:   Glucose, UA 50 (*)    Hgb urine dipstick SMALL (*)    Ketones, ur 5 (*)    Protein, ur 100 (*)    All other components within normal limits  I-STAT CHEM 8, ED - Abnormal; Notable for the following components:   BUN 27 (*)    Creatinine, Ser 3.00 (*)    Calcium, Ion 1.09 (*)    Hemoglobin 11.2 (*)    HCT 33.0 (*)    All other components within normal limits  ETHANOL  PROTIME-INR  DIFFERENTIAL  URINALYSIS, MICROSCOPIC  (REFLEX)  HIV ANTIBODY (ROUTINE TESTING)  TROPONIN I  TROPONIN I  TROPONIN I  COMPREHENSIVE METABOLIC PANEL  MAGNESIUM  PHOSPHORUS  APTT  PROTIME-INR  I-STAT TROPONIN, ED    EKG  EKG Interpretation  Date/Time:  Monday May 24 2017 09:25:55 EST Ventricular Rate:  57 PR Interval:    QRS Duration: 103 QT Interval:  488 QTC Calculation: 476 R Axis:   57 Text Interpretation:  Sinus rhythm RSR' in V1 or V2, probably normal variant LVH with secondary repolarization abnormality Borderline prolonged QT interval Confirmed by Davonna Belling [phone]) on 05/24/2017 10:34:23  AM       Radiology Ct Head Wo Contrast  Result Date: 05/24/2017 CLINICAL DATA:  Right-sided weakness beginning yesterday. History of stroke. EXAM: CT HEAD WITHOUT CONTRAST TECHNIQUE: Contiguous axial images were obtained from the base of the skull through the vertex without intravenous contrast. COMPARISON:  Head CT and MRI 04/24/2015 FINDINGS: Brain: There is an interval infarct anteriorly in the left basal ganglia which is chronic. Additional new lacunar infarcts in the left corona radiata and superior left lentiform nucleus region are less well defined and may be acute or subacute (series 2, images 18 and 20). Additional chronic infarcts are again seen involving the right basal ganglia, thalami, and deep cerebral white matter bilaterally. There is mild ex vacuo enlargement of the frontal horns of both lateral ventricles. There is no evidence of acute large territory infarct, intracranial hemorrhage, mass, midline shift, or extra-axial fluid collection. There is mild cerebral atrophy. Mild periventricular white matter hypodensities bilaterally are compatible with chronic small vessel ischemic disease. Vascular: Calcified atherosclerosis at the skull base. No hyperdense vessel. Skull: No fracture focal osseous lesion. Sinuses/Orbits: Mild posterior left ethmoid air cell opacification. Clear mastoid air cells. Unremarkable  orbits. Other: None. IMPRESSION: 1. No evidence of acute large territory infarct or intracranial hemorrhage. 2. New lacunar infarcts in the left corona radiata/superior basal ganglia, possibly acute or subacute. 3. 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 Electronically Signed   By: Logan Bores M.D.   On: 05/24/2017 11:21   Dg Chest Portable 1 View  Result Date: 05/24/2017 CLINICAL DATA:  Malaise, no other symptoms. The patient is a poor historian. History of hypertension, hyperlipidemia, current smoker. EXAM: PORTABLE CHEST 1 VIEW COMPARISON:  PA and lateral chest x-ray of December 09, 2010 FINDINGS: The lungs are well-expanded. The interstitial markings are mildly prominent but stable. The cardiac silhouette is enlarged. The pulmonary vascularity is slightly more conspicuous today. The trachea is midline. There calcification in the wall of the aortic arch. The bony thorax exhibits no acute abnormality. IMPRESSION: Chronic bronchitic-smoking related changes. Mild cardiomegaly with minimal central pulmonary vascular prominence. This may reflect low-grade CHF. No acute pneumonia. Thoracic aortic atherosclerosis. Electronically Signed   By: David  Martinique M.D.   On: 05/24/2017 10:50    Procedures Procedures (including critical care time)  Medications Ordered in ED Medications  calcium gluconate 1 g in sodium chloride 0.9 % 100 mL IVPB (not administered)  0.9 %  sodium chloride infusion (not administered)  nicardipine (CARDENE) 20mg  in 0.86% saline 254ml IV infusion (0.1 mg/ml) (not administered)  amLODipine (NORVASC) tablet 5 mg (not administered)  thiamine (B-1) injection 100 mg (not administered)  folic acid injection 1 mg (not administered)  atorvastatin (LIPITOR) tablet 40 mg (not administered)  nicardipine (CARDENE) 20mg  in 0.86% saline 27ml IV infusion (0.1 mg/ml) (8 mg/hr Intravenous Rate/Dose Change 05/24/17 1247)     Initial Impression / Assessment  and Plan / ED Course  I have reviewed the triage vital signs and the nursing notes.  Pertinent labs & imaging results that were available during my care of the patient were reviewed by me and considered in my medical decision making (see chart for details).     Patient with right-sided weakness.  Began sometime yesterday.  Patient is somewhat severely hypertensive.  Has been not taking his blood pressure medicines.  Also reportedly has been using cocaine however states that he has not used this month.  However drug screen is positive and use that will  be about 3 days.  Does have right-sided deficits compared to left side.  Unsure of baseline but states he has had a previous stroke on that side.  CT scan showed subacute to acute stroke.  Not a TPA candidate due to time of onset and blood pressure.  Creatinine is increased to 3 from a much more normal baseline.  Malignant hypertension and stroke.  Will admit to ICU with neurology consult.  Started on Cardene drip in the ER.  CRITICAL CARE Performed by: Davonna Belling Total critical care time: 30 minutes Critical care time was exclusive of separately billable procedures and treating other patients. Critical care was necessary to treat or prevent imminent or life-threatening deterioration. Critical care was time spent personally by me on the following activities: development of treatment plan with patient and/or surrogate as well as nursing, discussions with consultants, evaluation of patient's response to treatment, examination of patient, obtaining history from patient or surrogate, ordering and performing treatments and interventions, ordering and review of laboratory studies, ordering and review of radiographic studies, pulse oximetry and re-evaluation of patient's condition.   Final Clinical Impressions(s) / ED Diagnoses   Final diagnoses:  Malignant hypertension  Cerebrovascular accident (CVA), unspecified mechanism ([place])  Cocaine abuse  ([place])  Noncompliance w/medication treatment due to intermit use of medication    ED Discharge Orders    None       Davonna Belling, MD 05/24/17 1309

## 2017-05-24 NOTE — ED Triage Notes (Signed)
Pt to ER for right sided weakness that reportedly began yesterday, patient unable to state what time. Pt is a/o x4, weakness to right side noted. Hx of stroke. Has not been compliant with hypertension medications, BP 256/98 on arrival.

## 2017-05-25 ENCOUNTER — Inpatient Hospital Stay (HOSPITAL_COMMUNITY): Payer: Medicaid Other

## 2017-05-25 ENCOUNTER — Other Ambulatory Visit: Payer: Self-pay

## 2017-05-25 DIAGNOSIS — E785 Hyperlipidemia, unspecified: Secondary | ICD-10-CM

## 2017-05-25 DIAGNOSIS — I361 Nonrheumatic tricuspid (valve) insufficiency: Secondary | ICD-10-CM

## 2017-05-25 DIAGNOSIS — I639 Cerebral infarction, unspecified: Secondary | ICD-10-CM

## 2017-05-25 LAB — ECHOCARDIOGRAM COMPLETE
AVPHT: 445 ms
Ao-asc: 28 cm
Area-P 1/2: 3.73 cm2
CHL CUP REG VEL DIAS: 117 cm/s
E decel time: 201 msec
EERAT: 15.63
FS: 30 % (ref 28–44)
Height: 65 in
IV/PV OW: 0.75
LA ID, A-P, ES: 44 mm
LA diam index: 2.67 cm/m2
LA vol A4C: 79.3 ml
LA vol index: 49.3 mL/m2
LAVOL: 81.2 mL
LEFT ATRIUM END SYS DIAM: 44 mm
LV PW d: 16.5 mm — AB (ref 0.6–1.1)
LV TDI E'LATERAL: 6.3
LV TDI E'MEDIAL: 4.43
LVEEAVG: 15.63
LVEEMED: 15.63
LVELAT: 6.3 cm/s
LVOT area: 2.27 cm2
LVOT diameter: 17 mm
MV Dec: 201
MVPG: 4 mmHg
MVPKAVEL: 71 m/s
MVPKEVEL: 98.5 m/s
P 1/2 time: 59 ms
PV Reg grad dias: 5 mmHg
RV LATERAL S' VELOCITY: 13.7 cm/s
RV TAPSE: 21.2 mm
Reg peak vel: 278 cm/s
TR max vel: 278 cm/s
Weight: 2084.67 oz

## 2017-05-25 LAB — CBC
HEMATOCRIT: 31.8 % — AB (ref 39.0–52.0)
HEMOGLOBIN: 10.4 g/dL — AB (ref 13.0–17.0)
MCH: 26 pg (ref 26.0–34.0)
MCHC: 32.7 g/dL (ref 30.0–36.0)
MCV: 79.5 fL (ref 78.0–100.0)
Platelets: 244 10*3/uL (ref 150–400)
RBC: 4 MIL/uL — AB (ref 4.22–5.81)
RDW: 15.9 % — ABNORMAL HIGH (ref 11.5–15.5)
WBC: 9.2 10*3/uL (ref 4.0–10.5)

## 2017-05-25 LAB — LIPID PANEL
Cholesterol: 238 mg/dL — ABNORMAL HIGH (ref 0–200)
HDL: 48 mg/dL (ref >40)
LDL CALC: 167 mg/dL — AB (ref 0–99)
Total CHOL/HDL Ratio: 5 RATIO
Triglycerides: 117 mg/dL (ref <150)
VLDL: 23 mg/dL (ref 0–40)

## 2017-05-25 LAB — PHOSPHORUS: PHOSPHORUS: 3.7 mg/dL (ref 2.5–4.6)

## 2017-05-25 LAB — RPR: RPR Ser Ql: NONREACTIVE

## 2017-05-25 LAB — BASIC METABOLIC PANEL
ANION GAP: 12 (ref 5–15)
BUN: 26 mg/dL — ABNORMAL HIGH (ref 6–20)
CHLORIDE: 106 mmol/L (ref 101–111)
CO2: 22 mmol/L (ref 22–32)
Calcium: 8.4 mg/dL — ABNORMAL LOW (ref 8.9–10.3)
Creatinine, Ser: 3.05 mg/dL — ABNORMAL HIGH (ref 0.61–1.24)
GFR calc Af Amer: 23 mL/min — ABNORMAL LOW (ref >60)
GFR calc non Af Amer: 20 mL/min — ABNORMAL LOW (ref >60)
GLUCOSE: 94 mg/dL (ref 65–99)
POTASSIUM: 3.8 mmol/L (ref 3.5–5.1)
Sodium: 140 mmol/L (ref 135–145)

## 2017-05-25 LAB — HEMOGLOBIN A1C
HEMOGLOBIN A1C: 5.3 % (ref 4.8–5.6)
Mean Plasma Glucose: 105.41 mg/dL

## 2017-05-25 LAB — TSH: TSH: 1.546 u[IU]/mL (ref 0.350–4.500)

## 2017-05-25 LAB — TROPONIN I: Troponin I: 0.11 ng/mL (ref <0.03)

## 2017-05-25 LAB — VITAMIN B12: VITAMIN B 12: 334 pg/mL (ref 180–914)

## 2017-05-25 LAB — HIV ANTIBODY (ROUTINE TESTING W REFLEX): HIV SCREEN 4TH GENERATION: NONREACTIVE

## 2017-05-25 LAB — MAGNESIUM: Magnesium: 2 mg/dL (ref 1.7–2.4)

## 2017-05-25 MED ORDER — AMLODIPINE BESYLATE 10 MG PO TABS
10.0000 mg | ORAL_TABLET | Freq: Every day | ORAL | Status: DC
  Administered 2017-05-26 – 2017-06-03 (×9): 10 mg via ORAL
  Filled 2017-05-25 (×9): qty 1

## 2017-05-25 MED ORDER — SODIUM CHLORIDE 0.9 % IV BOLUS (SEPSIS)
500.0000 mL | Freq: Once | INTRAVENOUS | Status: AC
  Administered 2017-05-25: 500 mL via INTRAVENOUS

## 2017-05-25 MED ORDER — CLONIDINE HCL 0.1 MG PO TABS
0.1000 mg | ORAL_TABLET | Freq: Two times a day (BID) | ORAL | Status: DC
  Administered 2017-05-25 – 2017-05-26 (×3): 0.1 mg via ORAL
  Filled 2017-05-25 (×3): qty 1

## 2017-05-25 MED ORDER — ASPIRIN 81 MG PO CHEW
81.0000 mg | CHEWABLE_TABLET | Freq: Every day | ORAL | Status: DC
  Administered 2017-05-25: 81 mg via ORAL
  Filled 2017-05-25: qty 1

## 2017-05-25 NOTE — Progress Notes (Signed)
Carotid duplex prelim: 1-39% ICA stenosis.  Matthan Sledge Eunice, RDMS, RVT   

## 2017-05-25 NOTE — Progress Notes (Signed)
PULMONARY / CRITICAL CARE MEDICINE   Name: Lucas [name] MRN: [id number] DOB: 09/06/1950    ADMISSION DATE:  05/24/2017 CONSULTATION DATE:  05/24/17  REFERRING MD:  Alvino Chapel  CHIEF COMPLAINT:  Right sided weakness  HISTORY OF PRESENT ILLNESS:  Lucas [name] is a 67 y.o. male with PMH as outlined below. He presented to [facility] ED 2/11 with right sided weakness that began 1 day prior.  Symptoms did not resolve; therefore, he came to ED.  In ED, he was found to be hypertensive with presenting BP of 250 / 82.  UDS was positive for cocaine.  CT head demonstrated new lacunar infarcts in left corona and superior basal ganglia along with chronic b/l basal ganglia infarcts.  He was started on cardene gtt, neuro was consulted, and due to cardene, PCCM asked to admit to ICU.   SUBJECTIVE: Awake alert eating.  No acute distress at rest.  VITAL SIGNS: BP (!) 177/70   Pulse 66   Temp 98.2 F (36.8 C) (Oral)   Resp 18   Ht 5\' 5"  (1.651 m)   Wt 59.1 kg (130 lb 4.7 oz)   SpO2 92%   BMI 21.68 kg/m   HEMODYNAMICS:    VENTILATOR SETTINGS:    INTAKE / OUTPUT: I/O last 3 completed shifts: In: 1885.8 [P.O.:240; I.V.:1535.8; IV Piggyback:110] Out: 800 [Urine:800]   PHYSICAL EXAMINATION: General: Thin male in no acute distress sitting up eating. HEENT: No JVD or lymphadenopathy is appreciated PSY: Dull effect Neuro: Right-sided hemiparesis CV: Sounds are regular at this time systolic blood pressure 627 on Cardene at 5 PULM: There to auscultation OJ:JKKX, non-tender, bsx4 active  Extremities: warm/dry, - edema  Skin: no rashes or lesions   LABS:  BMET Recent Labs  Lab 05/24/17 0954 05/24/17 1016 05/24/17 1244 05/25/17 0251  NA 141 142 143 140  K 4.1 4.0 4.0 3.8  CL 107 106 108 106  CO2 22  --  20* 22  BUN 25* 27* 24* 26*  CREATININE 3.10* 3.00* 3.01* 3.05*  GLUCOSE 87 85 84 94    Electrolytes Recent Labs  Lab 05/24/17 0954 05/24/17 1244 05/25/17 0251  CALCIUM 8.5*  8.8* 8.4*  MG  --  2.1 2.0  PHOS  --  3.6 3.7    CBC Recent Labs  Lab 05/24/17 0954 05/24/17 1016 05/25/17 0251  WBC 8.0  --  9.2  HGB 10.2* 11.2* 10.4*  HCT 31.8* 33.0* 31.8*  PLT 227  --  244    Coag's Recent Labs  Lab 05/24/17 0954 05/24/17 1244  APTT 43* 49*  INR 1.10 1.07    Sepsis Markers No results for input(s): LATICACIDVEN, PROCALCITON, O2SATVEN in the last 168 hours.  ABG No results for input(s): PHART, PCO2ART, PO2ART in the last 168 hours.  Liver Enzymes Recent Labs  Lab 05/24/17 0954 05/24/17 1244  AST 13* 13*  ALT 7* 8*  ALKPHOS 70 77  BILITOT 0.6 0.7  ALBUMIN 2.9* 3.1*    Cardiac Enzymes Recent Labs  Lab 05/24/17 1244 05/24/17 1755 05/25/17 0015  TROPONINI 0.04* 0.08* 0.11*    Glucose Recent Labs  Lab 05/24/17 1516  GLUCAP 131*    Imaging Ct Head Wo Contrast  Result Date: 05/24/2017 CLINICAL DATA:  Right-sided weakness beginning yesterday. History of stroke. EXAM: CT HEAD WITHOUT CONTRAST TECHNIQUE: Contiguous axial images were obtained from the base of the skull through the vertex without intravenous contrast. COMPARISON:  Head CT and MRI 04/24/2015 FINDINGS: Brain: There is an interval infarct  anteriorly in the left basal ganglia which is chronic. Additional new lacunar infarcts in the left corona radiata and superior left lentiform nucleus region are less well defined and may be acute or subacute (series 2, images 18 and 20). Additional chronic infarcts are again seen involving the right basal ganglia, thalami, and deep cerebral white matter bilaterally. There is mild ex vacuo enlargement of the frontal horns of both lateral ventricles. There is no evidence of acute large territory infarct, intracranial hemorrhage, mass, midline shift, or extra-axial fluid collection. There is mild cerebral atrophy. Mild periventricular white matter hypodensities bilaterally are compatible with chronic small vessel ischemic disease. Vascular: Calcified  atherosclerosis at the skull base. No hyperdense vessel. Skull: No fracture focal osseous lesion. Sinuses/Orbits: Mild posterior left ethmoid air cell opacification. Clear mastoid air cells. Unremarkable orbits. Other: None. IMPRESSION: 1. No evidence of acute large territory infarct or intracranial hemorrhage. 2. New lacunar infarcts in the left corona radiata/superior basal ganglia, possibly acute or subacute. 3. 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 Electronically Signed   By: Logan Bores M.D.   On: 05/24/2017 11:21   Dg Chest Portable 1 View  Result Date: 05/24/2017 CLINICAL DATA:  Malaise, no other symptoms. The patient is a poor historian. History of hypertension, hyperlipidemia, current smoker. EXAM: PORTABLE CHEST 1 VIEW COMPARISON:  PA and lateral chest x-ray of December 09, 2010 FINDINGS: The lungs are well-expanded. The interstitial markings are mildly prominent but stable. The cardiac silhouette is enlarged. The pulmonary vascularity is slightly more conspicuous today. The trachea is midline. There calcification in the wall of the aortic arch. The bony thorax exhibits no acute abnormality. IMPRESSION: Chronic bronchitic-smoking related changes. Mild cardiomegaly with minimal central pulmonary vascular prominence. This may reflect low-grade CHF. No acute pneumonia. Thoracic aortic atherosclerosis. Electronically Signed   By: David  Martinique M.D.   On: 05/24/2017 10:50     STUDIES:  CT head 2/11 > new lacunar infarcts in left corona and superior basal ganglia along with chronic b/l basal ganglia infarcts. MRI brain 2/11 >  Echo 2/11 >  Carotid dopplers 2/11 >   CULTURES: None.  ANTIBIOTICS: None.  SIGNIFICANT EVENTS: 2/11 > admit.  LINES/TUBES: None.  DISCUSSION: 67 y.o. male with cocaine abuse, admitted 2/11 with right sided weakness due to new lacunar infarcts.  Also found to have hypertensive emergency with BP 250 / 82. 05/25/2017  Cardene at 5 with systolic blood pressure 502.  He is back on his home Norvasc but not on his Zestoretic.  Blood pressure goals per neurology.  ASSESSMENT / PLAN:  PULMONARY A: No acute issues. P: No interventions required.  CARDIOVASCULAR A:  Hypertensive emergency - presenting BP 250 / 82.  Exacerbated by cocaine abuse. Hx HTN, HLD. P:  Continue cardene for goal SBP 185 for 25% reduction over next 24 hours. ASA, statin, zestoretic per neuro. Trend troponin.Marland Kitchen08->0.11  RENAL Lab Results  Component Value Date   CREATININE 3.05 (H) 05/25/2017   CREATININE 3.01 (H) 05/24/2017   CREATININE 3.00 (H) 05/24/2017   CREATININE 0.99 08/22/2012   CREATININE 1.05 03/18/2011   CREATININE 0.98 01/06/2011   Recent Labs  Lab 05/24/17 1016 05/24/17 1244 05/25/17 0251  K 4.0 4.0 3.8    A:   AKI. Hypocalcemia. P:   1g Ca gluconate through 01/2017 BMP in AM. Creatinine remains at 3 follow 500 cc normal saline bolus Check bladder scan if creatinine does not improve next 24 hours we will do abdominal ultrasound versus  abdominal CT.  GASTROINTESTINAL A:   Nutrition. P:   Tolerating diet  HEMATOLOGIC A:   VTE Prophylaxis. P:  SCD's. CBC in AM.  INFECTIOUS A:   No indication of infection. P:   Monitor clinically.  ENDOCRINE CBG (last 3)  Recent Labs    05/24/17 1516  GLUCAP 131*    A: No acute issues. P: No interventions required.  NEUROLOGIC A:   New lacunar infarcts - likely due to HTN / cocaine. Hx CVA, substance abuse (UDS positive cocaine), EtOH abuse (says he drinks whenever he can get hold of EtOH). P:   Neuro following / managing stroke. Stroke workup per neuro. Thiamine / Folate. Substance abuse counseling. 05/25/2017 continues to have right-sided weakness awake and alert distress at rest.  Remains on Cardene drip Consider stopping Cardene drip and transferred to floor stepdown unit.   Family updated: None available.  Interdisciplinary  Family Meeting v Palliative Care Meeting:  Due by: 05/31/17.  CC time: 30 min.   Richardson Landry Anaiza Behrens ACNP Maryanna Shape PCCM Pager [phone] till 1 pm If no answer page [phone] 05/25/2017, 10:10 AM

## 2017-05-25 NOTE — Care Management (Signed)
Pt has active Medicaid - medication assistance can not be offered based on PTA medications.  CM will continue to follow for discharge needs

## 2017-05-25 NOTE — Progress Notes (Signed)
Brief Nutrition Note  Patient identified  On the Malnutrition Screening Tool (MST) Report.   S/w the RN who conducted the MST and the RN who has been with pt all day. Pts report of wt loss was considered questionable. RN reports that pt has a very good appetite.  Pt confirmed that he has not lost any weight. He reported that his UBW is 130lb (which is same as BW at time of admission to hospital).  He also reports having a really good appetite PTA and in hospital and denies issues eating (self-feeding, swallowing).  Pt asked about food intake PTA d/t status is possibly homeless. Pt relies on money from monthly disability check and local soup kitchen to get food. Pt also has Medicare.  Dietary Intake Recall: Breakfast around 10-11 am-- Jethro Bolus and Lucendia Herrlich from [employer] (e.g. Brendolyn Patty) Drink with breakfast-- Chucky May 40 oz beer Evening-- Meal prepared by local homeless outreach (e.g.Rice with Chicken, Hot dogs with baked beans)   Pt reports that near end of the month he has to be more resourceful getting food when his disability check runs out, but would not elaborate. Just states that he does a good job eating food. He makes a point to eat enough food to stay strong. He reports that he makes a point to avoid high-sodium foods.   Pt reports having an extensive family network in the area that he relies on. He reports taking turns asking different family members for help if needed (4 children; grownup grandchildren). Daughter was scheduled to visit him today when she gets off of work.    Wt Readings from Last 15 Encounters:  05/25/17 130 lb 4.7 oz (59.1 kg)  04/24/15 135 lb (61.2 kg)  06/12/14 153 lb (69.4 kg)  02/13/13 135 lb 12.8 oz (61.6 kg)  08/22/12 134 lb 11.2 oz (61.1 kg)  06/09/11 145 lb 9.6 oz (66 kg)  04/15/11 143 lb 14.4 oz (65.3 kg)  03/18/11 144 lb 8 oz (65.5 kg)  03/04/11 145 lb 12.8 oz (66.1 kg)  02/20/11 144 lb 8 oz (65.5 kg)  02/06/11 148 lb 12.8 oz  (67.5 kg)  01/06/11 147 lb 9.6 oz (67 kg)    BMI:  Body mass index is 21.68 kg/m. Patient meets criteria for Normal weight based on current BMI.  Current diet order is Diet Heart. Patient is consuming 40-100% of meals. Pt was hungry at time of nutrition assessment and asked for more food. Brought pt [place] ice pop per request.  Labs and medications reviewed.  No nutrition interventions are warranted at this time. If nutrition issues arise, please consult RD.   Edmonia Lynch, MS Dietetic Intern Pager: [phone]

## 2017-05-25 NOTE — Progress Notes (Addendum)
NEUROHOSPITALISTS STROKE TEAM - DAILY PROGRESS NOTE   ADMISSION HISTORY: Lucas [name] is an 67 y.o. male who lives in a hotel.  Patient came to [facility] ED on 2/11 with complaint of right-sided weakness that began a day prior.  Patient admits to having hypertension but not taking his meds as prescribed.  He takes his meds when he thinks he needs him.  Hypertensive the blood pressure of 250/82.  Patient was urine drug positive for cocaine however he does not admit to using cocaine.  CT head demonstrated a new lacunar infarct in the left corona radiata and superior basal ganglia.  Patient states usually can use his right side however at this point he cannot use his right side.  Date last known well: Date: 05/23/2017 Time last known well: Unable to determine tPA Given: No: Out of the window NIH stroke scale 6 Modified Rankin: Rankin Score=0  SUBJECTIVE (INTERVAL HISTORY) No family is at the bedside. Patient is found laying in bed in NAD. Overall he feels his condition is unchanged. Voices no new complaints. No new events reported overnight.   OBJECTIVE Lab Results: CBC:  Recent Labs  Lab 05/24/17 0954 05/24/17 1016 05/25/17 0251  WBC 8.0  --  9.2  HGB 10.2* 11.2* 10.4*  HCT 31.8* 33.0* 31.8*  MCV 80.7  --  79.5  PLT 227  --  244   BMP: Recent Labs  Lab 05/24/17 0954 05/24/17 1016 05/24/17 1244 05/25/17 0251  NA 141 142 143 140  K 4.1 4.0 4.0 3.8  CL 107 106 108 106  CO2 22  --  20* 22  GLUCOSE 87 85 84 94  BUN 25* 27* 24* 26*  CREATININE 3.10* 3.00* 3.01* 3.05*  CALCIUM 8.5*  --  8.8* 8.4*  MG  --   --  2.1 2.0  PHOS  --   --  3.6 3.7   Liver Function Tests:  Recent Labs  Lab 05/24/17 0954 05/24/17 1244  AST 13* 13*  ALT 7* 8*  ALKPHOS 70 77  BILITOT 0.6 0.7  PROT 6.2* 6.8  ALBUMIN 2.9* 3.1*   Thyroid Function Studies:  Recent Labs    05/25/17 0905  TSH 1.546   Cardiac Enzymes:  Recent Labs  Lab  05/24/17 1244 05/24/17 1755 05/25/17 0015  TROPONINI 0.04* 0.08* 0.11*   Coagulation Studies:  Recent Labs    05/24/17 0954 05/24/17 1244  APTT 43* 49*  INR 1.10 1.07   [place] Work -Up:  Recent Labs    05/25/17 0905  VITAMINB12 334   Urinalysis:  Recent Labs  Lab 05/24/17 0954  COLORURINE YELLOW  APPEARANCEUR CLEAR  LABSPEC 1.014  PHURINE 6.0  GLUCOSEU 50*  HGBUR SMALL*  BILIRUBINUR NEGATIVE  KETONESUR 5*  PROTEINUR 100*  NITRITE NEGATIVE  LEUKOCYTESUR NEGATIVE   Urine Drug Screen:     Component Value Date/Time   LABOPIA NONE DETECTED 05/24/2017 0954   COCAINSCRNUR POSITIVE (A) 05/24/2017 0954   LABBENZ NONE DETECTED 05/24/2017 0954   AMPHETMU NONE DETECTED 05/24/2017 0954   THCU NONE DETECTED 05/24/2017 0954   LABBARB NONE DETECTED 05/24/2017 0954    Alcohol Level:  Recent Labs  Lab 05/24/17 0954  ETH <10    PHYSICAL EXAM Temp:  [98 F (36.7 C)-98.5 F (36.9 C)] 98.3 F (36.8 C) (02/12 1602) Pulse Rate:  [62-90] 70 (02/12 1545) Resp:  [0-28] 22 (02/12 1545) BP: (157-201)/(63-104) 194/76 (02/12 1545) SpO2:  [88 %-98 %] 93 % (02/12 1545) Weight:  [59.1  kg (130 lb 4.7 oz)] 59.1 kg (130 lb 4.7 oz) (02/12 0416) General - Well nourished, well developed, in no apparent distress HEENT-  Normocephalic,    Cardiovascular - Regular rate and rhythm  Respiratory - Lungs clear bilaterally. No wheezing. Abdomen - soft and non-tender, BS normal Extremities- no edema or cyanosis Mental Status: Alert, oriented, thought content appropriate.  Speech fluent without evidence of aphasia.  Able to follow 3 step commands without difficulty. Cranial Nerves: II:  Visual fields grossly normal,  III,IV, VI: ptosis not present, extra-ocular motions intact bilaterally, pupils equal, round, reactive to light and accommodation V,VII: smile asymmetric with lag on the right, slight right nasolabial fold decreased prominence. Facial light touch sensation normal  bilaterally VIII: hearing normal bilaterally IX,X: uvula rises symmetrically XI: bilateral shoulder shrug XII: midline tongue extension Motor: Right : Upper extremity   0/5    Left:     Upper extremity   5/5  Lower extremity   0/5     Lower extremity   5/5 Tone and bulk:normal tone throughout; no atrophy noted Sensory: Pinprick and light touch stated to be intact throughout, bilaterally Deep Tendon Reflexes: 2+ and symmetric throughout Plantars: Right: downgoing   Left: downgoing Cerebellar: normal finger-to-nose on the left unable to perform on the right,  normal heel-to-shin test on the left unable to participate on the right Gait: Not tested  IMAGING: I have personally reviewed the radiological images below and agree with the radiology interpretations. Ct Head Wo Contrast Result Date: 05/24/2017 IMPRESSION: 1. No evidence of acute large territory infarct or intracranial hemorrhage. 2. New lacunar infarcts in the left corona radiata/superior basal ganglia, possibly acute or subacute. 3. 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 Electronically Signed   By: Logan Bores M.D.   On: 05/24/2017 11:21   Echocardiogram:                                    Study Conclusions  - Left ventricle: The cavity size was normal. Wall thickness was   increased in a pattern of moderate LVH. Systolic function was   normal. The estimated ejection fraction was in the range of 55%   to 60%. Wall motion was normal; there were no regional wall   motion abnormalities. Features are consistent with a pseudonormal   left ventricular filling pattern, with concomitant abnormal   relaxation and increased filling pressure (grade 2 diastolic   dysfunction). - Aortic valve: Trileaflet; mildly thickened, mildly calcified   leaflets. There was mild regurgitation. - Mitral valve: Calcified annulus. There was mild regurgitation. - Left atrium: The atrium was moderately dilated.  Volume/bsa, ES,   (1-plane Simpson&'s, A2C): 44.4 ml/m^2. - Tricuspid valve: There was mild regurgitation. - Pulmonary arteries: Systolic pressure was mildly increased. Impressions:- No cardiac source of emboli was indentified.  B/L Carotid U/S:  1-39% ICA stenosis  MRI/MRA Brain/Head:                                            PENDING                                  IMPRESSION: Mr. Lucas [name] is a 67 y.o. male with PMH  of HTN, HLD, Medication non-compliant and Polysubstance Abuse with +cocaine UDS who presents with acute onset Right sided weakness. CT Head reveals:  New lacunar infarcts in the left corona radiata/superior basal ganglia, possibly acute or subacute.  Suspected Etiology: Polysubstance abuse and small vessel disease Resultant Symptoms: Right sided deficits Stroke Risk Factors: hyperlipidemia, hypertension and smoking Other Stroke Risk Factors: Advanced age, Cigarette smoker, Polysubstance Abuse, Hx stroke,   Outstanding Stroke Work-up Studies:     MRI/MRA Brain/Head:                                            PENDING  05/25/2017 ASSESSMENT:   Neuro exam reveals 0/5 Right sided strength on today exam. Reviewed imaging, labs and POC. Counseled on polysubstance abuse, states he is going to quit. Patient will need intensive Inpatient Rehab. Some elevated B/P's noted. Home B/P  medication restarted today and additional meds added.  PLAN  05/25/2017: Continue Aspirin/ Statin Frequent neuro checks Telemetry monitoring PT/OT/SLP Consult PM & Rehab Consult Case Management /MSW Ongoing aggressive stroke risk factor management Patient counseled to be compliant with his antithrombotic medications Patient counseled on Lifestyle modifications including, Diet, Exercise, and Stress Follow up with [facility] in 6 weeks  HX OF STROKES: Chronic bilateral basal ganglia region infarcts bilaterally, increased from 2017.  HYPERTENSION: Stable, some elevated B/P  noted overnight SBP goal of < 180. DBP goal of < 105.  Labetolol PRN Long term BP goal normotensive. May slowly restart home B/P medications after 48 hours Home Meds: Norvasc, unsure if he was taking  HYPERLIPIDEMIA:    Component Value Date/Time   CHOL 238 (H) 05/25/2017 0905   TRIG 117 05/25/2017 0905   HDL 48 05/25/2017 0905   CHOLHDL 5.0 05/25/2017 0905   VLDL 23 05/25/2017 0905   LDLCALC 167 (H) 05/25/2017 0905  Home Meds:  Lipitor 40 mg, unsure if he was taking LDL  goal < 70 Continued on  Lipitor to 40 mg daily Continue statin at discharge  R/O DIABETES: Lab Results  Component Value Date   HGBA1C 5.3 05/25/2017  HgbA1c goal < 7.0  TOBACCO ABUSE & POLYSUBSTANCE ABUSE UDS+ Current smoker Smoking cessation counseling provided Nicotine patch provided  Other Active Problems: Active Problems:   Malignant hypertension   Cerebrovascular accident (CVA) [facility]    Hospital day # 1 VTE prophylaxis: SCD's  Diet : Diet Heart Room service appropriate? Yes; Fluid consistency: Thin   FAMILY UPDATES: No family at bedside  TEAM UPDATES: Rush Farmer, MD   Prior Home Stroke Medications:  aspirin 81 mg daily , unsure if he was taking Discharge Stroke Meds:  Please discharge patient on aspirin 81 mg daily   Disposition: 01-Home or Self Care Therapy Recs:               PENDING Follow Up:  Follow-up Information    Dennie Bible, NP. Schedule an appointment as soon as possible for a visit in 6 week(s).   Specialty:  Family Medicine Contact information: [address] [place] [address] [phone]          Patient, No Pcp Per -PCP Follow up in 1-2 weeks   Case Management aware of need   Assessment & plan discussed with with attending physician and they are in agreement.    Renie Ora Stroke Neurology Team 05/25/2017  4:20 PM  To contact Stroke Continuity provider, please refer to [email or web address] After hours, contact General  Neurology  Attending note: I reviewed above note and agree with the assessment and plan. I have made any additions or clarifications directly to the above note. Pt was seen and examined.   67 year old male with history of hypertension, hyperlipidemia, substance abuse, and smoker admitted for worsening right-sided weakness. He had a stroke in 11/2010 with right-sided weakness, imaging showed left internal capsule infarct, EF 55-60%, carotid Doppler negative.  Has residual right-sided mild weakness.  On this admission, his BP 250/82, creatinine 3.05, he was admitted to ICU for hypertensive emergency.  CT showed old bilateral caudate head infarcts as well as questionable left CR acute infarct.  MRI/MRA pending, 2D echo EF 55-60% and carotid Doppler unremarkable.  LDL 167, A1c 5.3, TSH and B12 within normal limits, RPR and HIV negative.  UDS positive for cocaine again.  He is not compliant with medication at home.  Currently on aspirin Plavix and Lipitor for stroke prevention.  BP gradually getting better.  Substance cessation education provided.  PT/OT pending. Continue ICU care for BP control, elevated troponin and AMS.   Rosalin Hawking, MD PhD Stroke Neurology 05/25/2017 5:23 PM  This patient is critically ill due to stroke, hypertensive emergency, hypotension, elevated troponin, cocaine use and at significant risk of neurological worsening, death form recurrent stroke, shock, MI, cardiac arrest. This patient's care requires constant monitoring of vital signs, hemodynamics, respiratory and cardiac monitoring, review of multiple databases, neurological assessment, discussion with family, other specialists and medical decision making of high complexity. I spent 30 minutes of neurocritical care time in the care of this patient.

## 2017-05-25 NOTE — Progress Notes (Signed)
PT Cancellation Note  Patient Details Name: Lucas [name] MRN: [id number] DOB: 04/20/1950   Cancelled Treatment:    Reason Eval/Treat Not Completed: Medical issues which prohibited therapy(troponins continue to rise, per protocol will hold therapies)   Duncan Dull 05/25/2017, 11:09 AM Alben Deeds, PT DPT  Board Certified Neurologic Specialist [phone]

## 2017-05-25 NOTE — Progress Notes (Signed)
  Echocardiogram 2D Echocardiogram has been performed.  Lucas [name] 05/25/2017, 1:08 PM

## 2017-05-25 NOTE — Plan of Care (Signed)
  Progressing Education: Knowledge of General Education information will improve 05/25/2017 2132 - Progressing by Levonne Hubert, RN Health Behavior/Discharge Planning: Ability to manage health-related needs will improve 05/25/2017 2132 - Progressing by Levonne Hubert, RN Clinical Measurements: Ability to maintain clinical measurements within normal limits will improve 05/25/2017 2132 - Progressing by Levonne Hubert, RN Note On cardene gtt, added clonidine today Will remain free from infection 05/25/2017 2132 - Progressing by Levonne Hubert, RN Diagnostic test results will improve 05/25/2017 2132 - Progressing by Levonne Hubert, RN Respiratory complications will improve 05/25/2017 2132 - Progressing by Levonne Hubert, RN Cardiovascular complication will be avoided 05/25/2017 2132 - Progressing by Levonne Hubert, RN Activity: Risk for activity intolerance will decrease 05/25/2017 2132 - Progressing by Levonne Hubert, RN Nutrition: Adequate nutrition will be maintained 05/25/2017 2132 - Progressing by Levonne Hubert, RN Coping: Level of anxiety will decrease 05/25/2017 2132 - Progressing by Levonne Hubert, RN Elimination: Will not experience complications related to bowel motility 05/25/2017 2132 - Progressing by Levonne Hubert, RN Will not experience complications related to urinary retention 05/25/2017 2132 - Progressing by Levonne Hubert, RN Pain Managment: General experience of comfort will improve 05/25/2017 2132 - Progressing by Levonne Hubert, RN Safety: Ability to remain free from injury will improve 05/25/2017 2132 - Progressing by Levonne Hubert, RN Skin Integrity: Risk for impaired skin integrity will decrease 05/25/2017 2132 - Progressing by Levonne Hubert, RN Education: Knowledge of disease or condition will improve 05/25/2017 2132 - Progressing by Levonne Hubert, RN Knowledge of secondary  prevention will improve 05/25/2017 2132 - Progressing by Levonne Hubert, RN Note Educated about risk factors, does not seem willing to make lifestyle changes at this time. Knowledge of patient specific risk factors addressed and post discharge goals established will improve 05/25/2017 2132 - Progressing by Levonne Hubert, RN Coping: Will verbalize positive feelings about self 05/25/2017 2132 - Progressing by Levonne Hubert, RN Will identify appropriate support needs 05/25/2017 2132 - Progressing by Levonne Hubert, RN Health Behavior/Discharge Planning: Ability to manage health-related needs will improve 05/25/2017 2132 - Progressing by Levonne Hubert, RN Self-Care: Ability to participate in self-care as condition permits will improve 05/25/2017 2132 - Progressing by Levonne Hubert, RN Verbalization of feelings and concerns over difficulty with self-care will improve 05/25/2017 2132 - Progressing by Levonne Hubert, RN Ischemic Stroke/TIA Tissue Perfusion: Complications of ischemic stroke/TIA will be minimized 05/25/2017 2132 - Progressing by Levonne Hubert, RN

## 2017-05-25 NOTE — Plan of Care (Signed)
Progressing. Patient participates in feeding self and using urinal to void independently.

## 2017-05-26 ENCOUNTER — Inpatient Hospital Stay (HOSPITAL_COMMUNITY): Payer: Medicaid Other

## 2017-05-26 DIAGNOSIS — I6601 Occlusion and stenosis of right middle cerebral artery: Secondary | ICD-10-CM

## 2017-05-26 DIAGNOSIS — I6521 Occlusion and stenosis of right carotid artery: Secondary | ICD-10-CM

## 2017-05-26 DIAGNOSIS — F1410 Cocaine abuse, uncomplicated: Secondary | ICD-10-CM

## 2017-05-26 DIAGNOSIS — Z9114 Patient's other noncompliance with medication regimen: Secondary | ICD-10-CM

## 2017-05-26 DIAGNOSIS — I63312 Cerebral infarction due to thrombosis of left middle cerebral artery: Secondary | ICD-10-CM

## 2017-05-26 LAB — BASIC METABOLIC PANEL
ANION GAP: 10 (ref 5–15)
BUN: 25 mg/dL — ABNORMAL HIGH (ref 6–20)
CHLORIDE: 110 mmol/L (ref 101–111)
CO2: 20 mmol/L — AB (ref 22–32)
Calcium: 7.8 mg/dL — ABNORMAL LOW (ref 8.9–10.3)
Creatinine, Ser: 2.96 mg/dL — ABNORMAL HIGH (ref 0.61–1.24)
GFR calc non Af Amer: 21 mL/min — ABNORMAL LOW (ref >60)
GFR, EST AFRICAN AMERICAN: 24 mL/min — AB (ref >60)
Glucose, Bld: 135 mg/dL — ABNORMAL HIGH (ref 65–99)
Potassium: 3.9 mmol/L (ref 3.5–5.1)
Sodium: 140 mmol/L (ref 135–145)

## 2017-05-26 LAB — PHOSPHORUS: PHOSPHORUS: 3.4 mg/dL (ref 2.5–4.6)

## 2017-05-26 LAB — MAGNESIUM: Magnesium: 1.9 mg/dL (ref 1.7–2.4)

## 2017-05-26 LAB — TROPONIN I: TROPONIN I: 0.04 ng/mL — AB (ref <0.03)

## 2017-05-26 MED ORDER — FOLIC ACID 1 MG PO TABS
1.0000 mg | ORAL_TABLET | Freq: Every day | ORAL | Status: DC
  Administered 2017-05-26 – 2017-06-03 (×9): 1 mg via ORAL
  Filled 2017-05-26 (×9): qty 1

## 2017-05-26 MED ORDER — HYDROCHLOROTHIAZIDE 25 MG PO TABS
25.0000 mg | ORAL_TABLET | Freq: Every day | ORAL | Status: DC
  Administered 2017-05-26 – 2017-06-03 (×9): 25 mg via ORAL
  Filled 2017-05-26 (×9): qty 1

## 2017-05-26 MED ORDER — CLOPIDOGREL BISULFATE 75 MG PO TABS
75.0000 mg | ORAL_TABLET | Freq: Every day | ORAL | Status: DC
  Administered 2017-05-26 – 2017-06-03 (×9): 75 mg via ORAL
  Filled 2017-05-26 (×9): qty 1

## 2017-05-26 MED ORDER — ACETAMINOPHEN 325 MG PO TABS
650.0000 mg | ORAL_TABLET | Freq: Four times a day (QID) | ORAL | Status: DC | PRN
  Administered 2017-05-26 (×2): 650 mg via ORAL
  Filled 2017-05-26 (×2): qty 2

## 2017-05-26 MED ORDER — VITAMIN B-1 100 MG PO TABS
100.0000 mg | ORAL_TABLET | Freq: Every day | ORAL | Status: DC
  Administered 2017-05-26 – 2017-06-03 (×9): 100 mg via ORAL
  Filled 2017-05-26 (×9): qty 1

## 2017-05-26 MED ORDER — ASPIRIN EC 325 MG PO TBEC
325.0000 mg | DELAYED_RELEASE_TABLET | Freq: Every day | ORAL | Status: DC
  Administered 2017-05-26 – 2017-06-03 (×9): 325 mg via ORAL
  Filled 2017-05-26 (×9): qty 1

## 2017-05-26 MED ORDER — MENTHOL 3 MG MT LOZG
1.0000 | LOZENGE | OROMUCOSAL | Status: DC | PRN
  Administered 2017-05-26 (×2): 3 mg via ORAL
  Filled 2017-05-26: qty 9

## 2017-05-26 MED ORDER — CLONIDINE HCL 0.2 MG PO TABS
0.2000 mg | ORAL_TABLET | Freq: Two times a day (BID) | ORAL | Status: DC
  Administered 2017-05-26 – 2017-06-03 (×16): 0.2 mg via ORAL
  Filled 2017-05-26 (×17): qty 1

## 2017-05-26 NOTE — Progress Notes (Signed)
NEUROHOSPITALISTS STROKE TEAM - DAILY PROGRESS NOTE   ADMISSION HISTORY: Lucas [name] is an 67 y.o. male who lives in a hotel.  Patient came to [facility] ED on 2/11 with complaint of right-sided weakness that began a day prior.  Patient admits to having hypertension but not taking his meds as prescribed.  He takes his meds when he thinks he needs him.  Hypertensive the blood pressure of 250/82.  Patient was urine drug positive for cocaine however he does not admit to using cocaine.  CT head demonstrated a new lacunar infarct in the left corona radiata and superior basal ganglia.  Patient states usually can use his right side however at this point he cannot use his right side.  Date last known well: Date: 05/23/2017 Time last known well: Unable to determine tPA Given: No: Out of the window NIH stroke scale 6 Modified Rankin: Rankin Score=0  SUBJECTIVE (INTERVAL HISTORY) No family is at the bedside. Patient is found laying in bed in NAD. Overall he feels his condition is unchanged, somewhat frustrated that his Right side is not moving much yet. Complaining of sore throat. No new events reported overnight.   OBJECTIVE Lab Results: CBC:  Recent Labs  Lab 05/24/17 0954 05/24/17 1016 05/25/17 0251  WBC 8.0  --  9.2  HGB 10.2* 11.2* 10.4*  HCT 31.8* 33.0* 31.8*  MCV 80.7  --  79.5  PLT 227  --  244   BMP: Recent Labs  Lab 05/24/17 0954 05/24/17 1016 05/24/17 1244 05/25/17 0251 05/26/17 0554  NA 141 142 143 140 140  K 4.1 4.0 4.0 3.8 3.9  CL 107 106 108 106 110  CO2 22  --  20* 22 20*  GLUCOSE 87 85 84 94 135*  BUN 25* 27* 24* 26* 25*  CREATININE 3.10* 3.00* 3.01* 3.05* 2.96*  CALCIUM 8.5*  --  8.8* 8.4* 7.8*  MG  --   --  2.1 2.0 1.9  PHOS  --   --  3.6 3.7 3.4   Liver Function Tests:  Recent Labs  Lab 05/24/17 0954 05/24/17 1244  AST 13* 13*  ALT 7* 8*  ALKPHOS 70 77  BILITOT 0.6 0.7  PROT 6.2* 6.8  ALBUMIN  2.9* 3.1*   Thyroid Function Studies:  Recent Labs    05/25/17 0905  TSH 1.546   Cardiac Enzymes:  Recent Labs  Lab 05/24/17 1244 05/24/17 1755 05/25/17 0015  TROPONINI 0.04* 0.08* 0.11*   Coagulation Studies:  Recent Labs    05/24/17 0954 05/24/17 1244  APTT 43* 49*  INR 1.10 1.07   [place] Work -Up:  Recent Labs    05/25/17 0905  VITAMINB12 334   Urinalysis:  Recent Labs  Lab 05/24/17 0954  COLORURINE YELLOW  APPEARANCEUR CLEAR  LABSPEC 1.014  PHURINE 6.0  GLUCOSEU 50*  HGBUR SMALL*  BILIRUBINUR NEGATIVE  KETONESUR 5*  PROTEINUR 100*  NITRITE NEGATIVE  LEUKOCYTESUR NEGATIVE   Urine Drug Screen:     Component Value Date/Time   LABOPIA NONE DETECTED 05/24/2017 0954   COCAINSCRNUR POSITIVE (A) 05/24/2017 0954   LABBENZ NONE DETECTED 05/24/2017 0954   AMPHETMU NONE DETECTED 05/24/2017 0954   THCU NONE DETECTED 05/24/2017 0954   LABBARB NONE DETECTED 05/24/2017 0954    Alcohol Level:  Recent Labs  Lab 05/24/17 0954  ETH <10    PHYSICAL EXAM Temp:  [97.3 F (36.3 C)-98.5 F (36.9 C)] 97.9 F (36.6 C) (02/13 1100) Pulse Rate:  [52-80] 52 (02/13 1300)  Resp:  [0-28] 0 (02/13 1300) BP: (149-209)/(66-88) 159/68 (02/13 1300) SpO2:  [90 %-99 %] 99 % (02/13 1300) Weight:  [59.4 kg (130 lb 15.3 oz)] 59.4 kg (130 lb 15.3 oz) (02/13 0357) General - Well nourished, well developed, in no apparent distress HEENT-  Normocephalic,    Cardiovascular - Regular rate and rhythm  Respiratory - Lungs clear bilaterally. No wheezing. Abdomen - soft and non-tender, BS normal Extremities- no edema or cyanosis Mental Status: Alert, oriented, thought content appropriate.  Speech fluent without evidence of aphasia.  Able to follow 3 step commands without difficulty. Cranial Nerves: II:  Visual fields grossly normal,  III,IV, VI: ptosis not present, extra-ocular motions intact bilaterally, pupils equal, round, reactive to light and accommodation V,VII: smile  asymmetric with lag on the right, slight right nasolabial fold decreased prominence. Facial light touch sensation normal bilaterally VIII: hearing normal bilaterally IX,X: uvula rises symmetrically XI: bilateral shoulder shrug XII: midline tongue extension Motor: Right : Upper extremity   0/5    Left:     Upper extremity   5/5  Lower extremity   0/5     Lower extremity   5/5 Tone and bulk:normal tone throughout; no atrophy noted Sensory: Pinprick and light touch stated to be intact throughout, bilaterally Deep Tendon Reflexes: 2+ and symmetric throughout Plantars: Right: downgoing   Left: downgoing Cerebellar: normal finger-to-nose on the left unable to perform on the right,  normal heel-to-shin test on the left unable to participate on the right Gait: Not tested  IMAGING: I have personally reviewed the radiological images below and agree with the radiology interpretations. Ct Head Wo Contrast Result Date: 05/24/2017 IMPRESSION: 1. No evidence of acute large territory infarct or intracranial hemorrhage. 2. New lacunar infarcts in the left corona radiata/superior basal ganglia, possibly acute or subacute. 3. 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 Electronically Signed   By: Logan Bores M.D.   On: 05/24/2017 11:21   Echocardiogram:                                    Study Conclusions  - Left ventricle: The cavity size was normal. Wall thickness was   increased in a pattern of moderate LVH. Systolic function was   normal. The estimated ejection fraction was in the range of 55%   to 60%. Wall motion was normal; there were no regional wall   motion abnormalities. Features are consistent with a pseudonormal   left ventricular filling pattern, with concomitant abnormal   relaxation and increased filling pressure (grade 2 diastolic   dysfunction). - Aortic valve: Trileaflet; mildly thickened, mildly calcified   leaflets. There was mild  regurgitation. - Mitral valve: Calcified annulus. There was mild regurgitation. - Left atrium: The atrium was moderately dilated. Volume/bsa, ES,   (1-plane Simpson&'s, A2C): 44.4 ml/m^2. - Tricuspid valve: There was mild regurgitation. - Pulmonary arteries: Systolic pressure was mildly increased. Impressions:- No cardiac source of emboli was indentified.  B/L Carotid U/S:  1-39% ICA stenosis  MRI/MRA Brain/Head:   IMPRESSION: MRI HEAD:  1. Acute subcentimeter nonhemorrhagic LEFT thalamus infarct. Acute versus subacute LEFT frontal lobe punctate infarct. 2. Old bilateral basal ganglia and thalamus infarcts. Old pontine lacunar infarcts. 3. Moderate chronic small vessel ischemic disease. 4. Moderate parenchymal brain volume loss.  MRA HEAD:  1. No emergent large vessel occlusion. 2. Severe stenosis RIGHT ICA cavernous segment. Slow flow RIGHT  MCA with severe stenosis RIGHT M2 origin.                                                                            IMPRESSION: 67 year old male with history of hypertension, hyperlipidemia, substance abuse, and smoker admitted for worsening right-sided weakness. He had a stroke in 11/2010 with right-sided weakness, imaging showed left internal capsule infarct, EF 55-60%, carotid Doppler negative.  Has residual right-sided mild weakness.  On this admission, his BP 250/82, creatinine 3.05, he was admitted to ICU for hypertensive emergency.  CT showed old bilateral caudate head infarcts as well as questionable left CR acute infarct.  MRI/MRA Left Thalamic and Left Frontal infarcts and IC stenosis, 2D echo EF 55-60% and carotid Doppler unremarkable.  LDL 167, A1c 5.3, TSH and B12 within normal limits, RPR and HIV negative.  UDS positive for cocaine again.  He is not compliant with medication at home.  Currently on aspirin Plavix and Lipitor for stroke prevention.  BP gradually getting better.  Substance cessation education provided.  PT/OT -  SNF  New lacunar infarcts in the left corona radiata/superior basal ganglia, possibly acute or subacute.  Suspected Etiology: Polysubstance abuse and small vessel disease Resultant Symptoms: Right sided deficits Stroke Risk Factors: hyperlipidemia, hypertension and smoking Other Stroke Risk Factors: Advanced age, Cigarette smoker, Polysubstance Abuse, Hx stroke,   Outstanding Stroke Work-up Studies:    Work up completed  05/26/2017 ASSESSMENT:   Neuro exam 0/5 Right sided strength on today exam. Patient frustrated that he Right side is not moving. Reviewed imaging. Will increase ASA dose and add Plavix for 3 months for IC stenosis, than Plavix alone. Working with therapies. Patient will need intensive Inpatient Rehab. B/P's better controlled overnight.   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  HYPERTENSION: Stable, some elevated B/P noted overnight Long term BP goal normotensive. May slowly restart home B/P medications after 48 hours Home Meds: Norvasc, he was not taking  HYPERLIPIDEMIA:    Component Value Date/Time   CHOL 238 (H) 05/25/2017 0905   TRIG 117 05/25/2017 0905   HDL 48 05/25/2017 0905   CHOLHDL 5.0 05/25/2017 0905   VLDL 23 05/25/2017 0905   LDLCALC 167 (H) 05/25/2017 0905  Home Meds:  Lipitor 40 mg, unsure if he was taking LDL  goal < 70 Continued on  Lipitor to 40 mg daily Continue statin at discharge  R/O DIABETES: Lab Results  Component Value Date   HGBA1C 5.3 05/25/2017  HgbA1c goal <  7.0  TOBACCO ABUSE & POLYSUBSTANCE ABUSE UDS+ Current smoker Smoking cessation counseling provided Nicotine patch provided  Other Active Problems: Active Problems:   Malignant hypertension   Cerebrovascular accident (CVA) [facility])    Hospital day # 2 VTE prophylaxis: SCD's  Diet : Diet Heart Room service appropriate? Yes; Fluid consistency: Thin   FAMILY UPDATES:  No family at bedside  TEAM UPDATES: Rush Farmer, MD   Prior Home Stroke Medications:  aspirin 81 mg daily ,  he was not taking Discharge Stroke Meds:  Please discharge patient on ASA 325 mg and Plavix 75 mg daily for 3 months and then just Plavix daily  Disposition: 01-Home or Self Care Therapy Recs:               SNF Follow Up:  Follow-up Information    Dennie Bible, NP. Schedule an appointment as soon as possible for a visit in 6 week(s).   Specialty:  Family Medicine Contact information: [address] [place] [address] [phone]          Patient, No Pcp Per -PCP Follow up in 1-2 weeks   Case Management aware of need   Assessment & plan discussed with with attending physician and they are in agreement.    Renie Ora Stroke Neurology Team 05/26/2017 1:08 PM  Attending note: I reviewed above note and agree with the assessment and plan. I have made any additions or clarifications directly to the above note. Pt was seen and examined.   67 year old male with history of hypertension, hyperlipidemia, substance abuse, and smoker admitted for worsening right-sided weakness. He had a stroke in 11/2010 with right-sided weakness, imaging showed left internal capsule infarct, EF 55-60%, carotid Doppler negative.  Has residual right-sided mild weakness.  On this admission, his BP 250/82, creatinine 3.05, he was admitted to ICU for hypertensive emergency.  CT showed old bilateral caudate head infarcts as well as questionable left CR acute infarct. MRI showed left IC and left  frontal subcortical small infarcts, as well as remote bilateral BG/thalamic infarcts, and remote pontine infarct, all consistent with small vessel disease. MRA showed severe right ICA siphon and M2 stenosis, apparently asymptomatic. 2D echo EF 55-60% and carotid Doppler unremarkable.  LDL 167, A1c 5.3, TSH and B12 within normal limits, RPR and HIV negative.  UDS positive for cocaine again.  He is not compliant with medication at home.  Currently on aspirin Plavix and Lipitor for stroke prevention.  Recommend continue dual antiplatelet for 3 months and then Plavix alone.  Continue statin.   Also due to separate infarcts this time in left ICA and the left frontal subcortical region, recommend 30-day cardio event monitor as outpatient to rule out A. fib.  BP gradually getting better, and under control.  Substance cessation education provided.  PT/OT pending.  Neurology will sign off. Please call with questions. Pt will follow up with Cecille Rubin, NP, at [facility] in about 6 weeks. Thanks for the consult.  Rosalin Hawking, MD PhD Stroke Neurology 05/26/2017 6:41 PM  To contact Stroke Continuity provider, please refer to [email or web address] After hours, contact General Neurology

## 2017-05-26 NOTE — Progress Notes (Signed)
CSW spoke with pt's daughter at bedside. Daughter Vito Backers) is interested in becoming pt's 9. CSW informed RN of this and asked that she consult Chaplain services for this need.     Virgie Dad Sayvion Vigen, MSW, [place] [facility] Clinical Social Worker [phone]

## 2017-05-26 NOTE — Progress Notes (Signed)
Patient's heart rate sinus with rate of 48 .SBP 130 .Marni Griffon NP informed

## 2017-05-26 NOTE — Progress Notes (Signed)
PT Cancellation Note  Patient Details Name: Lucas [name] MRN: [id number] DOB: 09/28/1950   Cancelled Treatment:    Reason Eval/Treat Not Completed: Medical issues which prohibited therapy(troponin continues to rise, will hold PT per protocol)   [place] 05/26/2017, 7:41 AM

## 2017-05-26 NOTE — Clinical Social Work Note (Signed)
Clinical Social Work Assessment  Patient Details  Name: Lucas [name] MRN: [id number] Date of Birth: [birth year]  Date of referral:  05/26/17               Reason for consult:  Housing Concerns/Homelessness                Permission sought to share information with:    Permission granted to share information::     Name::        Agency::     Relationship::     Contact Information:     Housing/Transportation Living arrangements for the past 2 months:  Homeless(pt reports that pt has been staying at a [facility] [address] ) Source of Information:  Patient Patient Interpreter Needed:  None Criminal Activity/Legal Involvement Pertinent to Current Situation/Hospitalization:  No - Comment as needed Significant Relationships:  None Lives with:  Self, Facility Resident Do you feel safe going back to the place where you live?  Yes Need for family participation in patient care:  Yes (Comment)  Care giving concerns:  CSW spoke with pt at bedside. At this time pt expressed concerns about pt's throat hurting and wanting the doctor to come and see what medicine pt could be given. Pt did not express any further concerns at this time to CSW.    Social Worker assessment / plan:  CSW spoke with pt at bedside. During this time CSW was informed that pt is homeless and has been staying at a drug and alcohol rehab facility on [address]. Pt was unable to tell CSW what the name of this facility was but did express that pt has been there for a few weeks. CSW was also informed that pt has siblings but none "care about" pt as pt reports "they aint got nothing to do with me". CSW offered pt a brochure for further resources on pt's medical needs. Pt is agreeable to SNF placement if needed at the time of discharge.   Employment status:  Unemployed Forensic scientist:  Medicaid In [place] PT Recommendations:  Not assessed at this time Information / Referral to community resources:  [place]  Patient/Family's Response to care:  Pt appeared to be upset and angry as pt is in pain and wanting further medicine to ease it at this time. Pt appeared to be understanding and agreeable to plan of care at this time.   Patient/Family's Understanding of and Emotional Response to Diagnosis, Current Treatment, and Prognosis:  No further questions or concerns have been presented to CSW at this time.   Emotional Assessment Appearance:  Appears stated age Attitude/Demeanor/Rapport:  Complaining(pt keep speaking about his throat hurting. ) Affect (typically observed):  Agitated, Frustrated, Restless Orientation:  Oriented to Self, Oriented to Situation, Oriented to Place, Oriented to  Time Alcohol / Substance use:  Not Applicable Psych involvement (Current and /or in the community):  No (Comment)(not at this time. )  Discharge Needs  Concerns to be addressed:  Homelessness Readmission within the last 30 days:  No Current discharge risk:  Homeless Barriers to Discharge:  Continued Medical Work up, Homeless with medical needs   Wetzel Bjornstad, [place] 05/26/2017, 8:22 AM

## 2017-05-26 NOTE — Progress Notes (Signed)
Patient noncompliant and refusing lab work. Patient educated about the need for labs, but patient became agitated and uncooperative and continues to refuse lab work.

## 2017-05-26 NOTE — Progress Notes (Addendum)
PULMONARY / CRITICAL CARE MEDICINE   Name: ZED WANNINGER MRN: [id number] DOB: April 04, 1951    ADMISSION DATE:  05/24/2017 CONSULTATION DATE:  05/24/17  REFERRING MD:  Alvino Chapel  CHIEF COMPLAINT:  Right sided weakness  HISTORY OF PRESENT ILLNESS:  Lucas [name] is a 67 y.o. male with PMH as outlined below. He presented to [facility] ED 2/11 with right sided weakness that began 1 day prior.  Symptoms did not resolve; therefore, he came to ED.  In ED, he was found to be hypertensive with presenting BP of 250 / 82.  UDS was positive for cocaine.  CT head demonstrated new lacunar infarcts in left corona and superior basal ganglia along with chronic b/l basal ganglia infarcts.  He was started on cardene gtt, neuro was consulted, and due to cardene, PCCM asked to admit to ICU.   SUBJECTIVE:  No distress.  A little frustrated w/ lack of improvement  PT holding off d/t rising Trop I   VITAL SIGNS: BP (!) 164/72   Pulse 63   Temp 98.1 F (36.7 C) (Oral)   Resp 20   Ht 5\' 5"  (1.651 m)   Wt 130 lb 15.3 oz (59.4 kg)   SpO2 96%   BMI 21.79 kg/m   HEMODYNAMICS:    VENTILATOR SETTINGS:    INTAKE / OUTPUT:  Intake/Output Summary (Last 24 hours) at 05/26/2017 1126 Last data filed at 05/26/2017 1100 Gross per 24 hour  Intake 2170 ml  Output 800 ml  Net 1370 ml      PHYSICAL EXAMINATION: General: 56-year-old male patient resting comfortably in bed HENT: Normocephalic atraumatic no jugular venous distention Pulmonary: Clear to auscultation no accessory use Cardiac: Regular rate and rhythm Neuro: Awake, oriented, appropriate.  Right-sided weakness/hemiparesis. Abdomen: Soft nontender no organomegaly Extremities: Warm, dry, no edema LABS:  BMET Recent Labs  Lab 05/24/17 1244 05/25/17 0251 05/26/17 0554  NA 143 140 140  K 4.0 3.8 3.9  CL 108 106 110  CO2 20* 22 20*  BUN 24* 26* 25*  CREATININE 3.01* 3.05* 2.96*  GLUCOSE 84 94 135*    Electrolytes Recent Labs  Lab  05/24/17 1244 05/25/17 0251 05/26/17 0554  CALCIUM 8.8* 8.4* 7.8*  MG 2.1 2.0 1.9  PHOS 3.6 3.7 3.4    CBC Recent Labs  Lab 05/24/17 0954 05/24/17 1016 05/25/17 0251  WBC 8.0  --  9.2  HGB 10.2* 11.2* 10.4*  HCT 31.8* 33.0* 31.8*  PLT 227  --  244    Coag's Recent Labs  Lab 05/24/17 0954 05/24/17 1244  APTT 43* 49*  INR 1.10 1.07    Sepsis Markers No results for input(s): LATICACIDVEN, PROCALCITON, O2SATVEN in the last 168 hours.  ABG No results for input(s): PHART, PCO2ART, PO2ART in the last 168 hours.  Liver Enzymes Recent Labs  Lab 05/24/17 0954 05/24/17 1244  AST 13* 13*  ALT 7* 8*  ALKPHOS 70 77  BILITOT 0.6 0.7  ALBUMIN 2.9* 3.1*    Cardiac Enzymes Recent Labs  Lab 05/24/17 1244 05/24/17 1755 05/25/17 0015  TROPONINI 0.04* 0.08* 0.11*    Glucose Recent Labs  Lab 05/24/17 1516  GLUCAP 131*    Imaging    STUDIES:  CT head 2/11 > new lacunar infarcts in left corona and superior basal ganglia along with chronic b/l basal ganglia infarcts. MRI brain 2/11 > 1. No emergent large vessel occlusion. 2. Severe stenosis RIGHT ICA cavernous segment. Slow flow RIGHT MCA with severe stenosis RIGHT M2 origin. Echo  2/11 > Left ventricle:  The cavity size was normal. Wall thickness was increased in a pattern of moderate LVH. Systolic function was normal. The estimated ejection fraction was in the range of 55% to 60%. Wall motion was normal; there were no regional wall motion abnormalities. Features are consistent with a pseudonormal left ventricular filling pattern, with concomitant abnormal relaxation and increased filling pressure (grade 2 diastolic dysfunction). Carotid dopplers 2/11 >   CULTURES: None.  ANTIBIOTICS: None.  SIGNIFICANT EVENTS: 2/11 > admit.  LINES/TUBES: None.  DISCUSSION: 67 y.o. male with cocaine abuse, admitted 2/11 with right sided weakness due to new lacunar infarcts.  Also found to have hypertensive  emergency with BP 250s Getting better.  Need to start w/ PT but held d/t last trop I elevation Ready to leave ICU    ASSESSMENT / PLAN:   Acute lacunar infarcts - likely due to HTN / cocaine. Hx CVA, substance abuse (UDS positive cocaine), EtOH abuse (says he drinks whenever he can get hold of EtOH). Plan Cont rehab efforts PT consulted (they want to see trop trending down) Get OOB Will likely need SNF Cont thiamine and folate.  Transfer to SDU setting  Cont secondary stroke prevention w/ asa and plavix  Hypertensive emergency - presenting BP 250 / 82.  Exacerbated by cocaine abuse. Hx HTN, HLD. Grade 2 diastolic HF w/ EF 46-50%, no WM abnormality  Plan  Cont norvasc Increase catapres to 0.2 bid Add low dose hctz Could consider low dose BB now that he no longer should have effect of cocaine in system but bradycardia would be limiting factor   Mild troponin elevation. Difficult to interpret in setting of AKI Plan Repeat trop today Consider out-pt ischemia eval   AKI-->improving (not clear what baseline is) Plan Trend chemistries  Fluid and electrolyte imbalance  Plan Trend chemistries & replace as indicated    Family updated: None available.  Interdisciplinary Family Meeting v Palliative Care Meeting:  Due by: 05/31/17.  CC time:32

## 2017-05-27 DIAGNOSIS — F10129 Alcohol abuse with intoxication, unspecified: Secondary | ICD-10-CM

## 2017-05-27 DIAGNOSIS — I6601 Occlusion and stenosis of right middle cerebral artery: Secondary | ICD-10-CM

## 2017-05-27 DIAGNOSIS — I5032 Chronic diastolic (congestive) heart failure: Secondary | ICD-10-CM

## 2017-05-27 DIAGNOSIS — I6381 Other cerebral infarction due to occlusion or stenosis of small artery: Secondary | ICD-10-CM

## 2017-05-27 LAB — BASIC METABOLIC PANEL
Anion gap: 7 (ref 5–15)
BUN: 26 mg/dL — AB (ref 6–20)
CHLORIDE: 113 mmol/L — AB (ref 101–111)
CO2: 21 mmol/L — ABNORMAL LOW (ref 22–32)
Calcium: 7.5 mg/dL — ABNORMAL LOW (ref 8.9–10.3)
Creatinine, Ser: 3.05 mg/dL — ABNORMAL HIGH (ref 0.61–1.24)
GFR calc Af Amer: 23 mL/min — ABNORMAL LOW (ref >60)
GFR calc non Af Amer: 20 mL/min — ABNORMAL LOW (ref >60)
GLUCOSE: 126 mg/dL — AB (ref 65–99)
POTASSIUM: 4.3 mmol/L (ref 3.5–5.1)
Sodium: 141 mmol/L (ref 135–145)

## 2017-05-27 LAB — TROPONIN I: TROPONIN I: 0.04 ng/mL — AB (ref <0.03)

## 2017-05-27 NOTE — Progress Notes (Signed)
Rehab Admissions Coordinator Note:  Patient was screened by Cleatrice Burke for appropriateness for an Inpatient Acute Rehab Consult per PT recommendation. Noted pt at drug and alcohol rehab pta. Otherwise homeless. At this time, we are recommending [place].  Cleatrice Burke 05/27/2017, 2:13 PM  I can be reached at [phone].

## 2017-05-27 NOTE — Progress Notes (Signed)
Responded to [facility] to assist with AD.  Patient asked that AD be left so he could go over it with his daughter..  Nurse will page chaplain when patient is ready.  Patient alert sitting up eating breakfast. Will follow as needed.  Jaclynn Major, [place], [facility], Pager [phone]

## 2017-05-27 NOTE — Progress Notes (Signed)
PROGRESS NOTE    Lucas [name]  KYH:[id number] DOB: [birth year] DOA: 05/24/2017 PCP: Patient, No Pcp Per   Brief Narrative:  67 y.o. male PMHx Polysubstance abuse (Cocaine), EtOH abuse,, CVA, HLD, HTN, Noncompliance with medication.   Presented to [facility] ED 2/11 with right sided weakness that began 1 day prior.  Symptoms did not resolve; therefore, he came to ED.   In ED, he was found to be hypertensive with presenting BP of 250 / 82.  UDS was positive for cocaine.  CT head demonstrated new lacunar infarcts in left corona and superior basal ganglia along with chronic b/l basal ganglia infarcts.   He was started on cardene gtt, neuro was consulted, and due to cardene, PCCM asked to admit to ICU.    Subjective: 2/14 A/O 4, negative CP, negative SOB, CP negative abdominal pain, negative N/V.    Assessment & Plan:   Active Problems:   Smoker   Malignant hypertension   Cerebrovascular accident (CVA) ([place])   Cocaine abuse ([place])   Noncompliance w/medication treatment due to intermit use of medication   Middle cerebral artery stenosis, right   Stenosis of right carotid artery  Acute lacunar infarct -Secondary to EtOH abuse (states he drinks whenever he can get hold of EtOH) and Cocaine abuse -CIR has declined to accept patient. Will need SNF placement -Continue secondary stroke prevention with ASA + Plavix  Hypertensive Eergency -On admission BP 250/82: Secondary to noncompliance, cocaine abuse, EtOH abuse. -Allow permissive HTN secondary to acute CVA. Over next week lower slowly to normal BP. -See CHF  Chronic Diastolic CHF -Strict in and out -Daily weight -Amlodipine 10 mg daily -Clonidine 0.2 mg BID -HCTZ 25 mg daily  Elevated troponin -Asymptomatic most likely secondary to CKD. Would not be a good candidate for invasive procedure as would precipitate kidney failure. -If true MI would manage medically.   CKD -04/24/2015 Creatinine= 1.29. Therefore no good comparison  given patient's HTN, EtOH abuse, cocaine abuse favor CKD unknown baseline    Mild troponin elevation. Difficult to interpret in setting of AKI Plan Repeat trop today Consider out-pt ischemia eval    Cocaine Abuse/EtOH abuse -Continue thiamine and folic acid -Patient counseled at length on sequela of continuing to abuse cocaine and EtOH to include MI, additional CVA, and DEATH -After patient completes SNF will require resources for alcohol/drug abuse as outpatient.  HLD -Lipid panel not within guidelines -Lipitor 40 mg daily   DVT prophylaxis: SCD Code Status: Full Family Communication: None Disposition Plan: SNF   Consultants:  [facility] M Stroke team     Procedures/Significant Events:  2/11 CT head ;new lacunar infarcts in left corona and superior basal ganglia along with chronic b/l basal ganglia infarcts. 2/11 MRI brain;- No emergent large vessel occlusion. 2. Severe stenosis RIGHT ICA cavernous segment. Slow flow RIGHT MCA with severe stenosis RIGHT M2 origin. 2/11 Echocardiogram  Left ventricle: -  moderate LVH. - 55% to 60%. -(grade 2 diastolic dysfunction). 2/11 Carotid dopplers:     I have personally reviewed and interpreted all radiology studies and my findings are as above.  VENTILATOR SETTINGS:    Cultures   Antimicrobials: Anti-infectives (From admission, onward)   None       Devices    LINES / TUBES:      Continuous Infusions: . sodium chloride    . sodium chloride 10 mL/hr at 05/26/17 1217  . niCARDipine Stopped (05/26/17 1100)     Objective: Vitals:   05/27/17 0430 05/27/17 0500  05/27/17 0600 05/27/17 0834  BP:  (!) 161/74 (!) 161/73   Pulse:  (!) 47 (!) 44   Resp:  18 19   Temp: 98.1 F (36.7 C)   98.1 F (36.7 C)  TempSrc: Oral   Oral  SpO2:  97% 100%   Weight:  132 lb 15 oz (60.3 kg)    Height:        Intake/Output Summary (Last 24 hours) at 05/27/2017 0848 Last data filed at 05/27/2017 0600 Gross per 24 hour  Intake  1170 ml  Output 1200 ml  Net -30 ml   Filed Weights   05/25/17 0416 05/26/17 0357 05/27/17 0500  Weight: 130 lb 4.7 oz (59.1 kg) 130 lb 15.3 oz (59.4 kg) 132 lb 15 oz (60.3 kg)    Examination:  General: A/O 4 No acute respiratory distress Neck:  Negative scars, masses, torticollis, lymphadenopathy, JVD Lungs: Clear to auscultation bilaterally without wheezes or crackles Cardiovascular: Regular rate and rhythm without murmur gallop or rub normal S1 and S2 Abdomen: negative abdominal pain, nondistended, positive soft, bowel sounds, no rebound, no ascites, no appreciable mass Extremities: No significant cyanosis, clubbing, or edema bilateral lower extremities Skin: Negative rashes, lesions, ulcers Psychiatric:  Negative depression, negative anxiety, negative fatigue, negative mania  Central nervous system:  Cranial nerves II through XII intact, tongue/uvula midline, LUE/LLL 5/5, right hemiparesis, sensation intact throughout, finger nose finger LUE WNL. quick finger touch LUE WNL   negative dysarthria, negative expressive aphasia, negative receptive aphasia.  .     Data Reviewed: Care during the described time interval was provided by me .  I have reviewed this patient's available data, including medical history, events of note, physical examination, and all test results as part of my evaluation.   CBC: Recent Labs  Lab 05/24/17 0954 05/24/17 1016 05/25/17 0251  WBC 8.0  --  9.2  NEUTROABS 6.3  --   --   HGB 10.2* 11.2* 10.4*  HCT 31.8* 33.0* 31.8*  MCV 80.7  --  79.5  PLT 227  --  354   Basic Metabolic Panel: Recent Labs  Lab 05/24/17 0954 05/24/17 1016 05/24/17 1244 05/25/17 0251 05/26/17 0554 05/26/17 2352  NA 141 142 143 140 140 141  K 4.1 4.0 4.0 3.8 3.9 4.3  CL 107 106 108 106 110 113*  CO2 22  --  20* 22 20* 21*  GLUCOSE 87 85 84 94 135* 126*  BUN 25* 27* 24* 26* 25* 26*  CREATININE 3.10* 3.00* 3.01* 3.05* 2.96* 3.05*  CALCIUM 8.5*  --  8.8* 8.4* 7.8* 7.5*   MG  --   --  2.1 2.0 1.9  --   PHOS  --   --  3.6 3.7 3.4  --    GFR: Estimated Creatinine Clearance: 20.9 mL/min (A) (by C-G formula based on SCr of 3.05 mg/dL (H)). Liver Function Tests: Recent Labs  Lab 05/24/17 0954 05/24/17 1244  AST 13* 13*  ALT 7* 8*  ALKPHOS 70 77  BILITOT 0.6 0.7  PROT 6.2* 6.8  ALBUMIN 2.9* 3.1*   No results for input(s): LIPASE, AMYLASE in the last 168 hours. No results for input(s): AMMONIA in the last 168 hours. Coagulation Profile: Recent Labs  Lab 05/24/17 0954 05/24/17 1244  INR 1.10 1.07   Cardiac Enzymes: Recent Labs  Lab 05/24/17 1244 05/24/17 1755 05/25/17 0015 05/26/17 1314 05/26/17 2352  TROPONINI 0.04* 0.08* 0.11* 0.04* 0.04*   BNP (last 3 results) No results for input(s): PROBNP in  the last 8760 hours. HbA1C: Recent Labs    05/25/17 0905  HGBA1C 5.3   CBG: Recent Labs  Lab 05/24/17 1516  GLUCAP 131*   Lipid Profile: Recent Labs    05/25/17 0905  CHOL 238*  HDL 48  LDLCALC 167*  TRIG 117  CHOLHDL 5.0   Thyroid Function Tests: Recent Labs    05/25/17 0905  TSH 1.546   Anemia Panel: Recent Labs    05/25/17 0905  VITAMINB12 334   Urine analysis:    Component Value Date/Time   COLORURINE YELLOW 05/24/2017 [place] 05/24/2017 0954   LABSPEC 1.014 05/24/2017 0954   PHURINE 6.0 05/24/2017 0954   GLUCOSEU 50 (A) 05/24/2017 0954   HGBUR SMALL (A) 05/24/2017 0954   BILIRUBINUR NEGATIVE 05/24/2017 0954   KETONESUR 5 (A) 05/24/2017 0954   PROTEINUR 100 (A) 05/24/2017 0954   UROBILINOGEN 0.2 12/09/2010 1117   NITRITE NEGATIVE 05/24/2017 0954   LEUKOCYTESUR NEGATIVE 05/24/2017 0954   Sepsis Labs: @LABRCNTIP (procalcitonin:4,lacticidven:4)  ) Recent Results (from the past 240 hour(s))  MRSA PCR Screening     Status: None   Collection Time: 05/24/17  1:31 PM  Result Value Ref Range Status   MRSA by PCR NEGATIVE NEGATIVE Final    Comment:        The GeneXpert MRSA Assay  (FDA approved for NASAL specimens only), is one component of a comprehensive MRSA colonization surveillance program. It is not intended to diagnose MRSA infection nor to guide or monitor treatment for MRSA infections. Performed at [facility], [place] [address], [place], [place] [address]          Radiology Studies:       Scheduled Meds: . amLODipine  10 mg Oral Daily  . aspirin EC  325 mg Oral Daily  . atorvastatin  40 mg Oral q1800  . cloNIDine  0.2 mg Oral BID  . clopidogrel  75 mg Oral Daily  . folic acid  1 mg Oral Daily  . hydrochlorothiazide  25 mg Oral Daily  . thiamine  100 mg Oral Daily   Continuous Infusions: . sodium chloride    . sodium chloride 10 mL/hr at 05/26/17 1217  . niCARDipine Stopped (05/26/17 1100)     LOS: 3 days    Time spent: 40 minutes    WOODS, Geraldo Docker, MD [facility] Pager [phone]   If 7PM-7AM, please contact night-coverage [email or web address] Password [facility] 05/27/2017, 8:48 AM

## 2017-05-27 NOTE — NC FL2 (Signed)
[place] LEVEL OF CARE SCREENING TOOL     IDENTIFICATION  Patient Name: Lucas [name] Birthdate: Nov 09, 1950 Sex: male Admission Date (Current Location): 05/24/2017  [facility] and [place] Number:  Herbalist and Address:  [facility], [place] [address] [place], [place] [address]      Provider Number: [id number]  Attending Physician Name and Address:  Allie Bossier, MD  Relative Name and Phone Number:       Current Level of Care: Hospital Recommended Level of Care: [place] Prior Approval Number:    Date Approved/Denied:   PASRR Number:   [id number]   Discharge Plan: SNF    Current Diagnoses: Patient Active Problem List   Diagnosis Date Noted  . Cocaine abuse ([place])   . Noncompliance w/medication treatment due to intermit use of medication   . Middle cerebral artery stenosis, right   . Stenosis of right carotid artery   . Cerebrovascular accident (CVA) ([place])   . 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 Smoker 06/12/2014  . Memory loss or impairment 02/13/2013  . Right shoulder pain 02/21/2011  . Hypertension 01/06/2011  . Hyperlipidemia with target LDL less than 100 01/06/2011  . Polysubstance abuse ([place]) 01/06/2011  . CVA (cerebral infarction) 01/06/2011  . Preventative health care 01/06/2011    Orientation RESPIRATION BLADDER Height & Weight     Self, Time, Situation, Place  Normal(room air ) Continent Weight: 132 lb 15 oz (60.3 kg) Height:  5\' 5"  (165.1 cm)  BEHAVIORAL SYMPTOMS/MOOD NEUROLOGICAL BOWEL NUTRITION STATUS      Continent Diet(please see discharge summary. )  AMBULATORY STATUS COMMUNICATION OF NEEDS Skin   Limited Assist Verbally Normal                       Personal Care Assistance Level of Assistance  Bathing, Feeding, Dressing Bathing Assistance: Maximum assistance Feeding assistance: Limited assistance Dressing Assistance: Maximum assistance     Functional Limitations  Info  Sight, Hearing, Speech Sight Info: Adequate Hearing Info: Adequate Speech Info: Adequate    SPECIAL CARE FACTORS FREQUENCY  PT (By licensed PT), OT (By licensed OT)     PT Frequency: 5 times a week  OT Frequency: 5 times a week             Contractures Contractures Info: Not present    Additional Factors Info  Code Status, Allergies Code Status Info: Full Allergies Info: Pork-derived Products           Current Medications (05/27/2017):  This is the current hospital active medication list Current Facility-Administered Medications  Medication Dose Route Frequency Provider Last Rate Last Dose  . 0.9 %  sodium chloride infusion  250 mL Intravenous PRN Rush Farmer, MD      . 0.9 %  sodium chloride infusion   Intravenous Continuous Erick Colace, NP 10 mL/hr at 05/26/17 1217    . acetaminophen (TYLENOL) tablet 650 mg  650 mg Oral Q6H PRN Mary Sella, NP   650 mg at 05/26/17 2134  . amLODipine (NORVASC) tablet 10 mg  10 mg Oral Daily Collene Gobble, MD   10 mg at 05/27/17 8841  . aspirin EC tablet 325 mg  325 mg Oral Daily Candise Che A, NP   325 mg at 05/27/17 6606  . atorvastatin (LIPITOR) tablet 40 mg  40 mg Oral q1800 Desai, Rahul P, PA-C   40 mg at 05/26/17 1724  .  cloNIDine (CATAPRES) tablet 0.2 mg  0.2 mg Oral BID Erick Colace, NP   0.2 mg at 05/27/17 1133  . clopidogrel (PLAVIX) tablet 75 mg  75 mg Oral Daily Candise Che A, NP   75 mg at 77/41/42 3953  . folic acid (FOLVITE) tablet 1 mg  1 mg Oral Daily Susa Raring, RPH   1 mg at 05/27/17 2023  . hydrochlorothiazide (HYDRODIURIL) tablet 25 mg  25 mg Oral Daily Erick Colace, NP   25 mg at 05/27/17 3435  . Influenza vac split quadrivalent PF (FLUARIX) injection 0.5 mL  0.5 mL Intramuscular Prior to discharge Rush Farmer, MD      . menthol-cetylpyridinium (CEPACOL) lozenge 3 mg  1 lozenge Oral PRN Deterding, Guadelupe Sabin, MD   3 mg at 05/26/17 0939  . nicardipine (CARDENE) 20mg  in  0.86% saline 280ml IV infusion (0.1 mg/ml)  3-15 mg/hr Intravenous Continuous Rush Farmer, MD   Stopped at 05/26/17 1100  . thiamine (VITAMIN B-1) tablet 100 mg  100 mg Oral Daily Susa Raring, RPH   100 mg at 05/27/17 6861     Discharge Medications: Please see discharge summary for a list of discharge medications.  Relevant Imaging Results:  Relevant Lab Results:   Additional Information SSN- [id number]  Wetzel Bjornstad, LCSWA

## 2017-05-27 NOTE — Evaluation (Signed)
Physical Therapy Evaluation Patient Details Name: Lucas [name] MRN: [id number] DOB: August 03, 1950 Today's Date: 05/27/2017   History of Present Illness  Pt is a 67 y/o male admitted secondary to R sided weakness. CT head demonstrated new lacunar infarcts in left corona and superior basal ganglia along with chronic b/l basal ganglia infarcts. PMH including but not limited HTN, polysubstance abuse and prior CVA in 2012.    Clinical Impression  Pt presented supine in bed with HOB elevated, awake and willing to participate in therapy session. Prior to admission, pt reported that he was independent with all functional mobility and ADLs. Pt currently requires mod A x2 for bed mobility, mod-max A x2 for transfers and close min guard to sit EOB with L UE support. Pt would greatly benefit from further intensive therapy services at [facility] to maximize his independence with functional mobility. Pt is pleasant and willing to work with therapists in hopes to return to his PLOF which was independent. PT will continue to follow acutely to progress mobility as tolerated.     Follow Up Recommendations CIR;Supervision/Assistance - 24 hour    Equipment Recommendations  None recommended by PT    Recommendations for Other Services Rehab consult;OT consult     Precautions / Restrictions Precautions Precautions: Fall Restrictions Weight Bearing Restrictions: No      Mobility  Bed Mobility Overal bed mobility: Needs Assistance Bed Mobility: Supine to Sit     Supine to sit: Mod assist;+2 for physical assistance     General bed mobility comments: increased time and effort, use of L UE on bed rail, assist with R LE movement and for trunk elevation  Transfers Overall transfer level: Needs assistance Equipment used: 2 person hand held assist Transfers: Sit to/from [facility] Sit to Stand: Mod assist;+2 physical assistance Stand pivot transfers: Max assist;+2 physical assistance        General transfer comment: pt a bit impulsive, eager to move, assist to rise into full standing with therapist blocking R knee to prevent buckling. Pt required max A x2 for pivotal movement from bed to chair x1 and from chair to [facility] x1  Ambulation/Gait                Stairs            Wheelchair Mobility    Modified Rankin (Stroke Patients Only) Modified Rankin (Stroke Patients Only) Pre-Morbid Rankin Score: No symptoms Modified Rankin: Moderately severe disability     Balance Overall balance assessment: Needs assistance Sitting-balance support: Feet supported;Single extremity supported Sitting balance-Leahy Scale: Poor     Standing balance support: During functional activity;Bilateral upper extremity supported Standing balance-Leahy Scale: Poor Standing balance comment: mod-max A x2                             Pertinent Vitals/Pain Pain Assessment: No/denies pain    Home Living Family/patient expects to be discharged to:: Unsure                 Additional Comments: Per chart review, pt is homeless or has been at a drug/alcohol rehab facility prior to admission    Prior Function Level of Independence: Independent               Hand Dominance        Extremity/Trunk Assessment   Upper Extremity Assessment Upper Extremity Assessment: Defer to OT evaluation    Lower Extremity Assessment Lower Extremity Assessment:  RLE deficits/detail RLE Deficits / Details: MMT revealed 0/5 for hip flexion, 2/5 for knee extension, 0/5 for ankle DF. Pt reported sensation is grossly intact for light touch throughout    Cervical / Trunk Assessment Cervical / Trunk Assessment: Normal  Communication   Communication: No difficulties  Cognition Arousal/Alertness: Awake/alert Behavior During Therapy: Flat affect;Impulsive Overall Cognitive Status: Impaired/Different from baseline Area of Impairment: Following commands;Safety/judgement;Problem  solving                       Following Commands: Follows one step commands consistently;Follows multi-step commands with increased time;Follows multi-step commands inconsistently Safety/Judgement: Decreased awareness of deficits;Decreased awareness of safety   Problem Solving: Requires verbal cues(for safety)        General Comments      Exercises     Assessment/Plan    PT Assessment Patient needs continued PT services  PT Problem List Decreased strength;Decreased activity tolerance;Decreased balance;Decreased mobility;Decreased coordination;Decreased knowledge of use of DME;Decreased safety awareness;Decreased knowledge of precautions       PT Treatment Interventions DME instruction;Gait training;Stair training;Functional mobility training;Therapeutic activities;Therapeutic exercise;Balance training;Neuromuscular re-education;Cognitive remediation;Patient/family education    PT Goals (Current goals can be found in the Care Plan section)  Acute Rehab PT Goals Patient Stated Goal: return to independence PT Goal Formulation: With patient Time For Goal Achievement: 06/10/17 Potential to Achieve Goals: Good    Frequency Min 4X/week   Barriers to discharge Decreased caregiver support      Co-evaluation               AM-PAC PT "6 Clicks" Daily Activity  Outcome Measure Difficulty turning over in bed (including adjusting bedclothes, sheets and blankets)?: Unable Difficulty moving from lying on back to sitting on the side of the bed? : Unable Difficulty sitting down on and standing up from a chair with arms (e.g., wheelchair, bedside commode, etc,.)?: Unable Help needed moving to and from a bed to chair (including a wheelchair)?: A Lot Help needed walking in hospital room?: A Lot Help needed climbing 3-5 steps with a railing? : Total 6 Click Score: 8    End of Session Equipment Utilized During Treatment: Gait belt Activity Tolerance: Patient tolerated  treatment well Patient left: with call bell/phone within reach;Other (comment)(on BSC with RN present) Nurse Communication: Mobility status PT Visit Diagnosis: Other abnormalities of gait and mobility (R26.89);Other symptoms and signs involving the nervous system (R29.898)    Time: 1035-1100 PT Time Calculation (min) (ACUTE ONLY): 25 min   Charges:   PT Evaluation $PT Eval Moderate Complexity: 1 Mod PT Treatments $Therapeutic Activity: 8-22 mins   PT G Codes:        [place], PT, DPT [place] 05/27/2017, 2:03 PM

## 2017-05-28 DIAGNOSIS — I6521 Occlusion and stenosis of right carotid artery: Secondary | ICD-10-CM

## 2017-05-28 LAB — BASIC METABOLIC PANEL
ANION GAP: 10 (ref 5–15)
BUN: 32 mg/dL — ABNORMAL HIGH (ref 6–20)
CO2: 20 mmol/L — AB (ref 22–32)
Calcium: 8 mg/dL — ABNORMAL LOW (ref 8.9–10.3)
Chloride: 110 mmol/L (ref 101–111)
Creatinine, Ser: 3.41 mg/dL — ABNORMAL HIGH (ref 0.61–1.24)
GFR calc Af Amer: 20 mL/min — ABNORMAL LOW (ref >60)
GFR calc non Af Amer: 18 mL/min — ABNORMAL LOW (ref >60)
GLUCOSE: 97 mg/dL (ref 65–99)
POTASSIUM: 4.5 mmol/L (ref 3.5–5.1)
Sodium: 140 mmol/L (ref 135–145)

## 2017-05-28 LAB — MAGNESIUM: Magnesium: 2 mg/dL (ref 1.7–2.4)

## 2017-05-28 NOTE — Progress Notes (Signed)
PROGRESS NOTE  DALLAS TOROK EXH:[id number] DOB: [birth year] DOA: 05/24/2017 PCP: Patient, No Pcp Per   LOS: 4 days   Brief Narrative / Interim history: Lucas [name] is a 67 y.o male with a past medical history for hyperlipidemia, hypertension, nicotine/cocaine abuse, and hx of stroke 5 years ago presented to the ED on 05/24/17 with complaints of right-sided weakness that began 1 day prior. On admission, patient was hypertensive with a BP of 250/82.Patient is currently homeless and lives in a hotel and has not taken his BP medications in a few months. UDS was positive for cocaine. CT scan demonstrated new lacunar infarcts in left corona/ superior basal ganglia. CXR illustrated cardiomegaly and chronic bronchitis w/o overt edema. MRI demonstrated acute subcentimeter nonhemorrhagic left thalamus infart, acute vs. Subacute left frontal lobe infarct, and evidence of old infarcts. MRA of head demonstrated severe stenosis of right IGA cavernous segment. US carotid showed <39% stenosis bilaterally. Echo indicated moderate LVH. Telemetry monitor noted an episode of 3rd degree heart block and fluctuating between 2nd degree type 2 and SB.  Patient was admitted on the working diagnosis of acute lacunar infarct possibly due to hypertensive emergency in the setting of cocaine intoxication.  Assessment & Plan: Active Problems:   Smoker   Malignant hypertension   Cerebrovascular accident (CVA) ([place])   Cocaine abuse ([place])   Noncompliance w/medication treatment due to intermit use of medication   Middle cerebral artery stenosis, right   Stenosis of right carotid artery   Acute Lacunar Infarct: Patient should be discharged to SNF when ready for continuously PT. Continue secondary stroke prevention with ASA + Plavix x 3 months and then Plavix alone thereafter. Recommend 30-day cardio event monitor as outpatient to rule out A. Fib due to separate infarcts in left ICA and left frontal subcortical region. Continue PT  to build up strength on right side.  Hypertensive Emergency: On admission, BP 256/98 due to noncompliance with medication, cocaine and alcohol abuse. Will continue to monitor elevated BP.  Hyperlipidemia: Continue Lipitor 40mg  with an LDL goal <70 as a preventative for future strokes.  Cocaine/EtOH abuse: UDS+ for cocaine on admission. Recommend rehab post-SNF and ongoing counseling/ support groups for patient.   DVT prophylaxis: SCDs Code Status: Full Family Communication: None Disposition Plan: SNF  Consultants:  Neurology PT  Procedures:    Antimicrobials:   Subjective: Patient is still unable to move right extremities. He states that the decrease in strength/ROM has gotten worse throughout his hospital stay. He denies problems swallowing or speaking.  Objective: Vitals:   05/27/17 1600 05/27/17 1750 05/27/17 2246 05/28/17 0532  BP: (!) 162/69 (!) 154/62 (!) 153/62 (!) 168/67  Pulse: (!) 45 (!) 49 (!) 45 (!) 49  Resp: (!) 22 16 16 16   Temp:  98.3 F (36.8 C) (!) 97.5 F (36.4 C) 98.1 F (36.7 C)  TempSrc:  Oral Oral Oral  SpO2: 100% 100% 100% 100%  Weight:  60.1 kg (132 lb 9.6 oz)  64.3 kg (141 lb 12.1 oz)  Height:  5\' 5"  (1.651 m)      Intake/Output Summary (Last 24 hours) at 05/28/2017 1237 Last data filed at 05/28/2017 1000 Gross per 24 hour  Intake 470 ml  Output 500 ml  Net -30 ml   Filed Weights   05/27/17 0500 05/27/17 1750 05/28/17 0532  Weight: 60.3 kg (132 lb 15 oz) 60.1 kg (132 lb 9.6 oz) 64.3 kg (141 lb 12.1 oz)    Examination:  Constitutional: NAD Eyes: PERRL,  lids and conjunctivae normal Neck: normal, supple, no masses, no thyromegaly Respiratory: clear to auscultation bilaterally. Expiratory wheeze heard throughout. Normal respiratory effort. No accessory muscle use.  Cardiovascular: Regular rate and rhythm, normal S1 and S2 no murmurs / rubs / gallops. No LE edema.  Musculoskeletal: no clubbing / cyanosis. No joint deformity upper and  lower extremities.  Skin: no rashes, lesions, ulcers. No induration Neurologic:  II: visual fields grossly normal II, IV, VI: EOMs intact bilaterally, PERRLA V, VII: slight right sided facial lag. Facial light touch intact bilaterally VIII: hearing intact bilaterally IX, X: uvula midline and rises symmetrically XI: shoulder shrug asymmetrical. Unable to lift right shoulder, but normal on left XII: midline tongue extension Motor: RUE: 1/5; RLE: 0/5; LUE: 5/5; LLE: 5/5; tone normal throughout Sensory: sensory intact bilaterally Deep Tendon Reflexes: 2+ throughout Gait/Romberg: not tested Psychiatric: Alert and oriented x 3.    Data Reviewed: I have independently reviewed following labs and imaging studies   CBC: Recent Labs  Lab 05/24/17 0954 05/24/17 1016 05/25/17 0251  WBC 8.0  --  9.2  NEUTROABS 6.3  --   --   HGB 10.2* 11.2* 10.4*  HCT 31.8* 33.0* 31.8*  MCV 80.7  --  79.5  PLT 227  --  875   Basic Metabolic Panel: Recent Labs  Lab 05/24/17 1244 05/25/17 0251 05/26/17 0554 05/26/17 2352 05/28/17 0531  NA 143 140 140 141 140  K 4.0 3.8 3.9 4.3 4.5  CL 108 106 110 113* 110  CO2 20* 22 20* 21* 20*  GLUCOSE 84 94 135* 126* 97  BUN 24* 26* 25* 26* 32*  CREATININE 3.01* 3.05* 2.96* 3.05* 3.41*  CALCIUM 8.8* 8.4* 7.8* 7.5* 8.0*  MG 2.1 2.0 1.9  --  2.0  PHOS 3.6 3.7 3.4  --   --    GFR: Estimated Creatinine Clearance: 19 mL/min (A) (by C-G formula based on SCr of 3.41 mg/dL (H)). Liver Function Tests: Recent Labs  Lab 05/24/17 0954 05/24/17 1244  AST 13* 13*  ALT 7* 8*  ALKPHOS 70 77  BILITOT 0.6 0.7  PROT 6.2* 6.8  ALBUMIN 2.9* 3.1*   No results for input(s): LIPASE, AMYLASE in the last 168 hours. No results for input(s): AMMONIA in the last 168 hours. Coagulation Profile: Recent Labs  Lab 05/24/17 0954 05/24/17 1244  INR 1.10 1.07   Cardiac Enzymes: Recent Labs  Lab 05/24/17 1244 05/24/17 1755 05/25/17 0015 05/26/17 1314 05/26/17 2352    TROPONINI 0.04* 0.08* 0.11* 0.04* 0.04*   BNP (last 3 results) No results for input(s): PROBNP in the last 8760 hours. HbA1C: No results for input(s): HGBA1C in the last 72 hours. CBG: Recent Labs  Lab 05/24/17 1516  GLUCAP 131*   Lipid Profile: No results for input(s): CHOL, HDL, LDLCALC, TRIG, CHOLHDL, LDLDIRECT in the last 72 hours. Thyroid Function Tests: No results for input(s): TSH, T4TOTAL, FREET4, T3FREE, THYROIDAB in the last 72 hours. Anemia Panel: No results for input(s): VITAMINB12, FOLATE, FERRITIN, TIBC, IRON, RETICCTPCT in the last 72 hours. Urine analysis:    Component Value Date/Time   COLORURINE YELLOW 05/24/2017 [place] 05/24/2017 0954   LABSPEC 1.014 05/24/2017 0954   PHURINE 6.0 05/24/2017 0954   GLUCOSEU 50 (A) 05/24/2017 0954   HGBUR SMALL (A) 05/24/2017 0954   BILIRUBINUR NEGATIVE 05/24/2017 0954   KETONESUR 5 (A) 05/24/2017 0954   PROTEINUR 100 (A) 05/24/2017 0954   UROBILINOGEN 0.2 12/09/2010 1117   NITRITE NEGATIVE 05/24/2017 6433  LEUKOCYTESUR NEGATIVE 05/24/2017 0954   Sepsis Labs: Invalid input(s): PROCALCITONIN, LACTICIDVEN  Recent Results (from the past 240 hour(s))  MRSA PCR Screening     Status: None   Collection Time: 05/24/17  1:31 PM  Result Value Ref Range Status   MRSA by PCR NEGATIVE NEGATIVE Final    Comment:        The GeneXpert MRSA Assay (FDA approved for NASAL specimens only), is one component of a comprehensive MRSA colonization surveillance program. It is not intended to diagnose MRSA infection nor to guide or monitor treatment for MRSA infections. Performed at [facility], [place] [address] [place], [place] [address]       Radiology Studies: No results found.   Scheduled Meds: . amLODipine  10 mg Oral Daily  . aspirin EC  325 mg Oral Daily  . atorvastatin  40 mg Oral q1800  . cloNIDine  0.2 mg Oral BID  . clopidogrel  75 mg Oral Daily  . folic acid  1 mg Oral Daily  .  hydrochlorothiazide  25 mg Oral Daily  . thiamine  100 mg Oral Daily   Continuous Infusions: . sodium chloride    . sodium chloride 10 mL/hr at 05/26/17 1217       Time spent: 25 minutes   Lanna Poche, PA-S 05/28/2017, 12:37 PM

## 2017-05-28 NOTE — Evaluation (Signed)
Occupational Therapy Evaluation Patient Details Name: Lucas [name] MRN: [id number] DOB: 17-Aug-1950 Today's Date: 05/28/2017    History of Present Illness Pt is a 67 y/o male admitted secondary to R sided weakness. CT head demonstrated new lacunar infarcts in left corona and superior basal ganglia along with chronic b/l basal ganglia infarcts. PMH including but not limited HTN, polysubstance abuse and prior CVA in 2012.   Clinical Impression   This 67 y/o M presents with the above. Pt reports being independent with ADLs and mobility at baseline. Pt presenting with decreased dynamic balance, RUE functional deficits, decreased functional performance. Pt currently requiring ModA+2 for bed mobility and stand pivot transfers this session; requires ModA+2 for LB ADLs,  Feel Pt would benefit from CIR level services after discharge to progress Pt to PLOF. Will continue to follow acutely to maximize his overall safety and independence with ADLs and mobility.    Follow Up Recommendations  CIR;Supervision/Assistance - 24 hour    Equipment Recommendations  Other (comment)(TBD in next venue )           Precautions / Restrictions Precautions Precautions: Fall Restrictions Weight Bearing Restrictions: No      Mobility Bed Mobility Overal bed mobility: Needs Assistance Bed Mobility: Supine to Sit     Supine to sit: Mod assist;+2 for physical assistance     General bed mobility comments: increased time and effort, use of L UE on bed rail, assist with R LE movement and for trunk elevation  Transfers Overall transfer level: Needs assistance Equipment used: 2 person hand held assist Transfers: Sit to/from [facility] Sit to Stand: Mod assist;+2 physical assistance Stand pivot transfers: Mod assist;+2 physical assistance;+2 safety/equipment       General transfer comment: assist to rise and steady in standing; Pt completing small pivotal steps with ModA+2 to turn and sit  in recliner     Balance Overall balance assessment: Needs assistance Sitting-balance support: Feet supported;Single extremity supported Sitting balance-Leahy Scale: Fair Sitting balance - Comments: able to briefly maintain static sitting with close MinGuard for safety    Standing balance support: During functional activity;Bilateral upper extremity supported Standing balance-Leahy Scale: Poor Standing balance comment: ModAx2                           ADL either performed or assessed with clinical judgement   ADL Overall ADL's : Needs assistance/impaired Eating/Feeding: Set up;Sitting Eating/Feeding Details (indicate cue type and reason): to open containers Grooming: Wash/dry face;Set up;Sitting Grooming Details (indicate cue type and reason): supported sitting  Upper Body Bathing: Moderate assistance;Sitting   Lower Body Bathing: Sit to/from stand;+2 for physical assistance;+2 for safety/equipment;Moderate assistance   Upper Body Dressing : Moderate assistance;Sitting   Lower Body Dressing: Moderate assistance;+2 for physical assistance;+2 for safety/equipment;Sit to/from stand   Toilet Transfer: Moderate assistance;+2 for physical assistance;+2 for safety/equipment;Stand-pivot;BSC Toilet Transfer Details (indicate cue type and reason): simulated in transfer to recliner          Functional mobility during ADLs: Moderate assistance;+2 for physical assistance;+2 for safety/equipment       Vision Baseline Vision/History: No visual deficits Patient Visual Report: No change from baseline Vision Assessment?: Yes Ocular Range of Motion: Restricted on the left Tracking/Visual Pursuits: Decreased smoothness of horizontal tracking                Pertinent Vitals/Pain Pain Assessment: No/denies pain          Extremity/Trunk Assessment Upper  Extremity Assessment Upper Extremity Assessment: RUE deficits/detail RUE Deficits / Details: overall flaccid during  PROM, minimal tone noted during elbow PROM    Lower Extremity Assessment Lower Extremity Assessment: Defer to PT evaluation   Cervical / Trunk Assessment Cervical / Trunk Assessment: Normal   Communication Communication Communication: No difficulties   Cognition Arousal/Alertness: Awake/alert Behavior During Therapy: Flat affect Overall Cognitive Status: Impaired/Different from baseline Area of Impairment: Following commands;Safety/judgement;Problem solving                       Following Commands: Follows one step commands consistently;Follows multi-step commands with increased time;Follows multi-step commands inconsistently Safety/Judgement: Decreased awareness of deficits;Decreased awareness of safety   Problem Solving: Requires verbal cues General Comments: pt initially with flat affect and minimally responsive; as session progressed Pt becoming more engaging                     Home Living Family/patient expects to be discharged to:: Unsure                                 Additional Comments: Per chart review, pt is homeless or has been at a drug/alcohol rehab facility prior to admission      Prior Functioning/Environment Level of Independence: Independent                 OT Problem List: Decreased strength;Decreased range of motion;Impaired balance (sitting and/or standing);Decreased activity tolerance;Decreased coordination      OT Treatment/Interventions: Self-care/ADL training;DME and/or AE instruction;Balance training;Therapeutic activities;Therapeutic exercise;Energy conservation;Patient/family education    OT Goals(Current goals can be found in the care plan section) Acute Rehab OT Goals Patient Stated Goal: return to independence OT Goal Formulation: With patient Time For Goal Achievement: 06/11/17 Potential to Achieve Goals: Good  OT Frequency: Min 2X/week                             AM-PAC PT "6 Clicks"  Daily Activity     Outcome Measure Help from another person eating meals?: A Little Help from another person taking care of personal grooming?: A Little Help from another person toileting, which includes using toliet, bedpan, or urinal?: A Lot Help from another person bathing (including washing, rinsing, drying)?: A Lot Help from another person to put on and taking off regular upper body clothing?: A Lot Help from another person to put on and taking off regular lower body clothing?: A Lot 6 Click Score: 14   End of Session Equipment Utilized During Treatment: Gait belt Nurse Communication: Mobility status  Activity Tolerance: Patient tolerated treatment well Patient left: in chair;with call bell/phone within reach;with chair alarm set  OT Visit Diagnosis: Unsteadiness on feet (R26.81);Hemiplegia and hemiparesis Hemiplegia - Right/Left: Right Hemiplegia - dominant/non-dominant: Dominant Hemiplegia - caused by: Cerebral infarction                Time: 2703-5009 OT Time Calculation (min): 20 min Charges:  OT Evaluation $OT Eval Moderate Complexity: 1 Mod G-Codes:     Lou Cal, OT Pager [phone] 05/28/2017   Raymondo Band 05/28/2017, 4:37 PM

## 2017-05-28 NOTE — Care Management Note (Signed)
Case Management Note  Patient Details  Name: Lucas [name] MRN: [id number] Date of Birth: May 27, 1950  Subjective/Objective:    Malignant HTN               Action/Plan: CM following for progression of care; Lives in [place]; has private insurance with Medicaid with prescription drug coverage; noted pt is for SNF placement.  Expected Discharge Date:   possibly 06/01/2017               Expected Discharge Plan:  Home/Self Care  In-House Referral:   SW  Discharge planning Services  CM Consult   Status of Service:  In process, will continue to follow  Sherrilyn Rist [phone] 05/28/2017, 10:20 AM

## 2017-05-28 NOTE — Progress Notes (Signed)
Received call from tele that patient had an episode of 3rd degree HB, now fluctuating between 2nd degree Type 2 and SB. MD notified.

## 2017-05-28 NOTE — Progress Notes (Signed)
PROGRESS NOTE    GLOYD HAPP  LSL:[id number] DOB: 04/07/1951 DOA: 05/24/2017 PCP: Patient, No Pcp Per    Brief Narrative:  67 year old male who presented with right-sided weakness.  Patient does have a significant past medical history of CVA 2012, dyslipidemia, hypertension and polysubstance abuse.  Patient presented with weakness for 24 hours prior to hospitalization, his symptoms were persistent.  On his initial physical examination blood pressure 223/80, heart rate 63, temperature 98, respiratory 23, oxygen saturation 96%.  He was awake and alert, difficulty with speech, right-sided weakness, ataxia, lungs were clear to auscultation, heart S1-S2 present rhythmic, the abdomen was soft nontender, no lower extremity edema.  Sodium 143, potassium 4.0, chloride 1 8, bicarb 20, glucose 84, BUN 24, creatinine 3.0, white count 9.2, Humulin 10.4, hematocrit 31.8, platelets 244.  Urinalysis negative for infection.  Drug screen positive for cocaine.  Head CT with lacunar infarcts in the left corona radiata, superior basal ganglia, possibly acute to subacute.  EKG sinus rhythm, 57 bpm, positive LVH.  Patient was admitted to the hospital with a working diagnosis of acute hypertensive emergency complicated by acute CVA.  Assessment & Plan:   Active Problems:   Smoker   Malignant hypertension   Cerebrovascular accident (CVA) ([place])   Cocaine abuse ([place])   Noncompliance w/medication treatment due to intermit use of medication   Middle cerebral artery stenosis, right   Stenosis of right carotid artery   1. Acute lacunar infarct. Will continue neuro checks, physical therapy evaluation, antiplatelet therapy with asa and clopidogrel.   2. Hypertensive emergency. Blood pressure systolic 115 range, will continue close monitoring, continue clonidine, hctz and amlodipine.   3. Diastolic heart failure. Will continue blood pressure control, patient is euvolemic.   4. CKD. Stable renal function, avoid  nephrotoxic medications.  5. Cocaine abuse. No signs of withdrawal.   6. Dyslipidemia. Continue statin therapy.    DVT prophylaxis: scd  Code Status:  full Family Communication: no fmily at the bedside Disposition Plan: snf   Consultants:   Neurology   Procedures:     Antimicrobials:       Subjective: Patient not in pain, continue to have right hemiparesis, no nausea or vomiting, no chest pain or dyspnea.   Objective: Vitals:   05/27/17 1600 05/27/17 1750 05/27/17 2246 05/28/17 0532  BP: (!) 162/69 (!) 154/62 (!) 153/62 (!) 168/67  Pulse: (!) 45 (!) 49 (!) 45 (!) 49  Resp: (!) 22 16 16 16   Temp:  98.3 F (36.8 C) (!) 97.5 F (36.4 C) 98.1 F (36.7 C)  TempSrc:  Oral Oral Oral  SpO2: 100% 100% 100% 100%  Weight:  60.1 kg (132 lb 9.6 oz)  64.3 kg (141 lb 12.1 oz)  Height:  5\' 5"  (1.651 m)      Intake/Output Summary (Last 24 hours) at 05/28/2017 1247 Last data filed at 05/28/2017 1000 Gross per 24 hour  Intake 470 ml  Output 500 ml  Net -30 ml   Filed Weights   05/27/17 0500 05/27/17 1750 05/28/17 0532  Weight: 60.3 kg (132 lb 15 oz) 60.1 kg (132 lb 9.6 oz) 64.3 kg (141 lb 12.1 oz)    Examination:   General: Not in pain or dyspnea, deconditioned Neurology: Awake and alert, non focal. Right hemiparesis, with 1/5 on right upper extremity and 0/5 on the right lower extremity.  E ENT: no pallor, no icterus, oral mucosa moist Cardiovascular: No JVD. S1-S2 present, rhythmic, no gallops, rubs, or murmurs. No lower extremity  edema. Pulmonary: vesicular breath sounds bilaterally, adequate air movement, no wheezing, rhonchi or rales. Gastrointestinal. Abdomen flat, no organomegaly, non tender, no rebound or guarding Skin. No rashes Musculoskeletal: no joint deformities     Data Reviewed: I have personally reviewed following labs and imaging studies  CBC: Recent Labs  Lab 05/24/17 0954 05/24/17 1016 05/25/17 0251  WBC 8.0  --  9.2  NEUTROABS 6.3  --    --   HGB 10.2* 11.2* 10.4*  HCT 31.8* 33.0* 31.8*  MCV 80.7  --  79.5  PLT 227  --  751   Basic Metabolic Panel: Recent Labs  Lab 05/24/17 1244 05/25/17 0251 05/26/17 0554 05/26/17 2352 05/28/17 0531  NA 143 140 140 141 140  K 4.0 3.8 3.9 4.3 4.5  CL 108 106 110 113* 110  CO2 20* 22 20* 21* 20*  GLUCOSE 84 94 135* 126* 97  BUN 24* 26* 25* 26* 32*  CREATININE 3.01* 3.05* 2.96* 3.05* 3.41*  CALCIUM 8.8* 8.4* 7.8* 7.5* 8.0*  MG 2.1 2.0 1.9  --  2.0  PHOS 3.6 3.7 3.4  --   --    GFR: Estimated Creatinine Clearance: 19 mL/min (A) (by C-G formula based on SCr of 3.41 mg/dL (H)). Liver Function Tests: Recent Labs  Lab 05/24/17 0954 05/24/17 1244  AST 13* 13*  ALT 7* 8*  ALKPHOS 70 77  BILITOT 0.6 0.7  PROT 6.2* 6.8  ALBUMIN 2.9* 3.1*   No results for input(s): LIPASE, AMYLASE in the last 168 hours. No results for input(s): AMMONIA in the last 168 hours. Coagulation Profile: Recent Labs  Lab 05/24/17 0954 05/24/17 1244  INR 1.10 1.07   Cardiac Enzymes: Recent Labs  Lab 05/24/17 1244 05/24/17 1755 05/25/17 0015 05/26/17 1314 05/26/17 2352  TROPONINI 0.04* 0.08* 0.11* 0.04* 0.04*   BNP (last 3 results) No results for input(s): PROBNP in the last 8760 hours. HbA1C: No results for input(s): HGBA1C in the last 72 hours. CBG: Recent Labs  Lab 05/24/17 1516  GLUCAP 131*   Lipid Profile: No results for input(s): CHOL, HDL, LDLCALC, TRIG, CHOLHDL, LDLDIRECT in the last 72 hours. Thyroid Function Tests: No results for input(s): TSH, T4TOTAL, FREET4, T3FREE, THYROIDAB in the last 72 hours. Anemia Panel: No results for input(s): VITAMINB12, FOLATE, FERRITIN, TIBC, IRON, RETICCTPCT in the last 72 hours.    Radiology Studies: I have reviewed all of the imaging during this hospital visit personally     Scheduled Meds: . amLODipine  10 mg Oral Daily  . aspirin EC  325 mg Oral Daily  . atorvastatin  40 mg Oral q1800  . cloNIDine  0.2 mg Oral BID  .  clopidogrel  75 mg Oral Daily  . folic acid  1 mg Oral Daily  . hydrochlorothiazide  25 mg Oral Daily  . thiamine  100 mg Oral Daily   Continuous Infusions: . sodium chloride    . sodium chloride 10 mL/hr at 05/26/17 1217     LOS: 4 days        Tawni Millers, MD [facility] Pager [phone]

## 2017-05-29 DIAGNOSIS — F17200 Nicotine dependence, unspecified, uncomplicated: Secondary | ICD-10-CM

## 2017-05-29 NOTE — Progress Notes (Signed)
PROGRESS NOTE    Lucas [name]  OEU:[id number] DOB: 01-03-1951 DOA: 05/24/2017 PCP: Patient, No Pcp Per    Brief Narrative:  67 year old male who presented with right-sided weakness.  Patient does have a significant past medical history of CVA 2012, dyslipidemia, hypertension and polysubstance abuse.  Patient presented with weakness for 24 hours prior to hospitalization, his symptoms were persistent.  On his initial physical examination blood pressure 223/80, heart rate 63, temperature 98, respiratory 23, oxygen saturation 96%.  He was awake and alert, difficulty with speech, right-sided weakness, ataxia, lungs were clear to auscultation, heart S1-S2 present rhythmic, the abdomen was soft nontender, no lower extremity edema.  Sodium 143, potassium 4.0, chloride 1 8, bicarb 20, glucose 84, BUN 24, creatinine 3.0, white count 9.2, Humulin 10.4, hematocrit 31.8, platelets 244.  Urinalysis negative for infection.  Drug screen positive for cocaine.  Head CT with lacunar infarcts in the left corona radiata, superior basal ganglia, possibly acute to subacute.  EKG sinus rhythm, 57 bpm, positive LVH.  Patient was admitted to the hospital with a working diagnosis of acute hypertensive emergency complicated by acute CVA.   Assessment & Plan:   Active Problems:   Smoker   Malignant hypertension   Cerebrovascular accident (CVA) ([place])   Cocaine abuse ([place])   Noncompliance w/medication treatment due to intermit use of medication   Middle cerebral artery stenosis, right   Stenosis of right carotid artery   1. Acute lacunar infarct. Persistent hemiparesis, recommendations for inpatient rehab, will continue neuro checks. Dualantiplatelet therapy with asa and clopidogrel. Statin therapy. Continue physical therapy evaluation. Will dc telemetry.   2. Hypertensive emergency/ resolved. Continue clonidine, hctz and amlodipine.  Blood pressure systolic 154 to 008.   3. Diastolic heart failure. No signs of  exacerbation. Clinically euvolemic.   4. CKD stage 4. Base cr at 3.0, with calculated GFR at 20. Will continue blood pressure monitoring. Check BMP in am.   5. Cocaine abuse. Currently with no signs of withdrawal.   6. Dyslipidemia. Statin therapy with good toleration   DVT prophylaxis: scd  Code Status:  full Family Communication: no fmily at the bedside Disposition Plan: snf   Consultants:   Neurology   Procedures:     Antimicrobials:      Subjective: Patient with persistent right hemiparesis, no nausea or vomiting, tolerating po well.   Objective: Vitals:   05/28/17 2056 05/29/17 0328 05/29/17 0503 05/29/17 0938  BP: (!) 144/61  (!) 155/73 (!) 169/68  Pulse: (!) 43  (!) 44   Resp: 16  18   Temp: 98.8 F (37.1 C)  98.8 F (37.1 C)   TempSrc: Oral  Oral   SpO2: 100%  99% 100%  Weight:  62.6 kg (138 lb 0.1 oz)    Height:        Intake/Output Summary (Last 24 hours) at 05/29/2017 1119 Last data filed at 05/29/2017 1000 Gross per 24 hour  Intake 600 ml  Output 1100 ml  Net -500 ml   Filed Weights   05/27/17 1750 05/28/17 0532 05/29/17 0328  Weight: 60.1 kg (132 lb 9.6 oz) 64.3 kg (141 lb 12.1 oz) 62.6 kg (138 lb 0.1 oz)    Examination:   General: Not in pain or dyspnea, deconditioned Neurology: Awake and alert, right hemiparesis, strength 1/5 upper and lower extremity on the right. Left 5/5 E ENT: no pallor, no icterus, oral mucosa moist Cardiovascular: No JVD. S1-S2 present, rhythmic, no gallops, rubs, or murmurs. No lower extremity  edema. Pulmonary: vesicular breath sounds bilaterally, adequate air movement, no wheezing, rhonchi or rales. Gastrointestinal. Abdomen flat, no organomegaly, non tender, no rebound or guarding Skin. No rashes Musculoskeletal: no joint deformities     Data Reviewed: I have personally reviewed following labs and imaging studies  CBC: Recent Labs  Lab 05/24/17 0954 05/24/17 1016 05/25/17 0251  WBC 8.0  --   9.2  NEUTROABS 6.3  --   --   HGB 10.2* 11.2* 10.4*  HCT 31.8* 33.0* 31.8*  MCV 80.7  --  79.5  PLT 227  --  476   Basic Metabolic Panel: Recent Labs  Lab 05/24/17 1244 05/25/17 0251 05/26/17 0554 05/26/17 2352 05/28/17 0531  NA 143 140 140 141 140  K 4.0 3.8 3.9 4.3 4.5  CL 108 106 110 113* 110  CO2 20* 22 20* 21* 20*  GLUCOSE 84 94 135* 126* 97  BUN 24* 26* 25* 26* 32*  CREATININE 3.01* 3.05* 2.96* 3.05* 3.41*  CALCIUM 8.8* 8.4* 7.8* 7.5* 8.0*  MG 2.1 2.0 1.9  --  2.0  PHOS 3.6 3.7 3.4  --   --    GFR: Estimated Creatinine Clearance: 19 mL/min (A) (by C-G formula based on SCr of 3.41 mg/dL (H)). Liver Function Tests: Recent Labs  Lab 05/24/17 0954 05/24/17 1244  AST 13* 13*  ALT 7* 8*  ALKPHOS 70 77  BILITOT 0.6 0.7  PROT 6.2* 6.8  ALBUMIN 2.9* 3.1*   No results for input(s): LIPASE, AMYLASE in the last 168 hours. No results for input(s): AMMONIA in the last 168 hours. Coagulation Profile: Recent Labs  Lab 05/24/17 0954 05/24/17 1244  INR 1.10 1.07   Cardiac Enzymes: Recent Labs  Lab 05/24/17 1244 05/24/17 1755 05/25/17 0015 05/26/17 1314 05/26/17 2352  TROPONINI 0.04* 0.08* 0.11* 0.04* 0.04*   BNP (last 3 results) No results for input(s): PROBNP in the last 8760 hours. HbA1C: No results for input(s): HGBA1C in the last 72 hours. CBG: Recent Labs  Lab 05/24/17 1516  GLUCAP 131*   Lipid Profile: No results for input(s): CHOL, HDL, LDLCALC, TRIG, CHOLHDL, LDLDIRECT in the last 72 hours. Thyroid Function Tests: No results for input(s): TSH, T4TOTAL, FREET4, T3FREE, THYROIDAB in the last 72 hours. Anemia Panel: No results for input(s): VITAMINB12, FOLATE, FERRITIN, TIBC, IRON, RETICCTPCT in the last 72 hours.    Radiology Studies: I have reviewed all of the imaging during this hospital visit personally     Scheduled Meds: . amLODipine  10 mg Oral Daily  . aspirin EC  325 mg Oral Daily  . atorvastatin  40 mg Oral q1800  . cloNIDine   0.2 mg Oral BID  . clopidogrel  75 mg Oral Daily  . folic acid  1 mg Oral Daily  . hydrochlorothiazide  25 mg Oral Daily  . thiamine  100 mg Oral Daily   Continuous Infusions: . sodium chloride    . sodium chloride 10 mL/hr at 05/26/17 1217     LOS: 5 days        Tawni Millers, MD [facility] Pager [phone]

## 2017-05-30 LAB — BASIC METABOLIC PANEL
Anion gap: 9 (ref 5–15)
BUN: 36 mg/dL — AB (ref 6–20)
CO2: 23 mmol/L (ref 22–32)
Calcium: 8.2 mg/dL — ABNORMAL LOW (ref 8.9–10.3)
Chloride: 107 mmol/L (ref 101–111)
Creatinine, Ser: 3.29 mg/dL — ABNORMAL HIGH (ref 0.61–1.24)
GFR calc Af Amer: 21 mL/min — ABNORMAL LOW (ref >60)
GFR, EST NON AFRICAN AMERICAN: 18 mL/min — AB (ref >60)
GLUCOSE: 111 mg/dL — AB (ref 65–99)
POTASSIUM: 4.6 mmol/L (ref 3.5–5.1)
Sodium: 139 mmol/L (ref 135–145)

## 2017-05-30 NOTE — Progress Notes (Signed)
PROGRESS NOTE    Lucas [name]  DJS:[id number] DOB: [birth year] DOA: 05/24/2017 PCP: Patient, No Pcp Per    Brief Narrative:  67 year old male who presented with right-sided weakness. Patient does have a significant past medical history of CVA 2012, dyslipidemia, hypertension and polysubstance abuse. Patient presented with weakness for 24 hours prior to hospitalization, his symptoms were persistent.On his initial physical examination blood pressure 223/80, heart rate 63, temperature 98, respiratory 23, oxygen saturation 96%.He was awake and alert, difficulty with speech, right-sided weakness, ataxia, lungs were clear to auscultation, heart S1-S2 present rhythmic, the abdomen was soft nontender, no lower extremity edema. Sodium 143, potassium 4.0, chloride 1 8, bicarb 20, glucose 84, BUN 24, creatinine 3.0, white count 9.2, Humulin 10.4, hematocrit 31.8, platelets 244.Urinalysis negative for infection. Drug screen positive for cocaine.Head CT with lacunar infarcts in the left corona radiata, superior basal ganglia, possibly acute to subacute. EKG sinus rhythm, 57 bpm, positive LVH.  Patient was admitted to the hospital with a working diagnosis of acute hypertensive emergency complicated by acute CVA.  Assessment & Plan:   Active Problems:   Smoker   Malignant hypertension   Cerebrovascular accident (CVA) ([place])   Cocaine abuse ([place])   Noncompliance w/medication treatment due to intermit use of medication   Middle cerebral artery stenosis, right   Stenosis of right carotid artery    1. Acute lacunar infarct. continue with severe hemiparesis,  will continue neuro checks per unit protocol, medical therapy with dualantiplatelet therapy with asa and clopidogrel. Statin therapy. Plan for inpatient rehab. Follow with social services for discharge planing.  2. Hypertensive emergency/ resolved. On clonidine, hctz and amlodipine, tolerating well.  3. Diastolic heart failure. Stable  with no signs of exacerbation.   4. CKD stage 4. Renal function stable with serum cr at 3,2, K at 4,6 and serum bicarbonate at 23. Patient tolerating po well. Avoid nephrotoxic medications.   5. Cocaine abuse. No active withdrawal.   6. Dyslipidemia. Continue with atorvastatin.    DVT prophylaxis:scd Code Status:full Family Communication:no family at the bedside Disposition Plan:snf   Consultants:  Neurology  Procedures:    Antimicrobials:       Subjective: Patient with persistent weakness, no nausea or vomiting, no chest pain or dyspnea.   Objective: Vitals:   05/29/17 1300 05/29/17 2112 05/30/17 0500 05/30/17 0525  BP: (!) 130/49 (!) 142/55  (!) 150/57  Pulse: (!) 41 (!) 46  (!) 40  Resp: 20 18  18   Temp: 98.2 F (36.8 C) 98.7 F (37.1 C)  98 F (36.7 C)  TempSrc: Oral Oral  Oral  SpO2: 100% 100%  100%  Weight:   61.4 kg (135 lb 5.8 oz)   Height:        Intake/Output Summary (Last 24 hours) at 05/30/2017 1012 Last data filed at 05/29/2017 1300 Gross per 24 hour  Intake 125 ml  Output -  Net 125 ml   Filed Weights   05/28/17 0532 05/29/17 0328 05/30/17 0500  Weight: 64.3 kg (141 lb 12.1 oz) 62.6 kg (138 lb 0.1 oz) 61.4 kg (135 lb 5.8 oz)    Examination:   General: Not in pain or dyspnea, deconditioned Neurology: Awake and alert, right hemiparesis 1/5 upper and lower extremity.  E ENT: mild pallor, no icterus, oral mucosa moist Cardiovascular: No JVD. S1-S2 present, rhythmic, no gallops, rubs, or murmurs. No lower extremity edema. Pulmonary: vesicular breath sounds bilaterally, adequate air movement, no wheezing, rhonchi or rales. Gastrointestinal. Abdomen flat, no organomegaly,  non tender, no rebound or guarding Skin. No rashes Musculoskeletal: no joint deformities     Data Reviewed: I have personally reviewed following labs and imaging studies  CBC: Recent Labs  Lab 05/24/17 0954 05/24/17 1016 05/25/17 0251  WBC 8.0   --  9.2  NEUTROABS 6.3  --   --   HGB 10.2* 11.2* 10.4*  HCT 31.8* 33.0* 31.8*  MCV 80.7  --  79.5  PLT 227  --  789   Basic Metabolic Panel: Recent Labs  Lab 05/24/17 1244 05/25/17 0251 05/26/17 0554 05/26/17 2352 05/28/17 0531 05/30/17 0627  NA 143 140 140 141 140 139  K 4.0 3.8 3.9 4.3 4.5 4.6  CL 108 106 110 113* 110 107  CO2 20* 22 20* 21* 20* 23  GLUCOSE 84 94 135* 126* 97 111*  BUN 24* 26* 25* 26* 32* 36*  CREATININE 3.01* 3.05* 2.96* 3.05* 3.41* 3.29*  CALCIUM 8.8* 8.4* 7.8* 7.5* 8.0* 8.2*  MG 2.1 2.0 1.9  --  2.0  --   PHOS 3.6 3.7 3.4  --   --   --    GFR: Estimated Creatinine Clearance: 19.7 mL/min (A) (by C-G formula based on SCr of 3.29 mg/dL (H)). Liver Function Tests: Recent Labs  Lab 05/24/17 0954 05/24/17 1244  AST 13* 13*  ALT 7* 8*  ALKPHOS 70 77  BILITOT 0.6 0.7  PROT 6.2* 6.8  ALBUMIN 2.9* 3.1*   No results for input(s): LIPASE, AMYLASE in the last 168 hours. No results for input(s): AMMONIA in the last 168 hours. Coagulation Profile: Recent Labs  Lab 05/24/17 0954 05/24/17 1244  INR 1.10 1.07   Cardiac Enzymes: Recent Labs  Lab 05/24/17 1244 05/24/17 1755 05/25/17 0015 05/26/17 1314 05/26/17 2352  TROPONINI 0.04* 0.08* 0.11* 0.04* 0.04*   BNP (last 3 results) No results for input(s): PROBNP in the last 8760 hours. HbA1C: No results for input(s): HGBA1C in the last 72 hours. CBG: Recent Labs  Lab 05/24/17 1516  GLUCAP 131*   Lipid Profile: No results for input(s): CHOL, HDL, LDLCALC, TRIG, CHOLHDL, LDLDIRECT in the last 72 hours. Thyroid Function Tests: No results for input(s): TSH, T4TOTAL, FREET4, T3FREE, THYROIDAB in the last 72 hours. Anemia Panel: No results for input(s): VITAMINB12, FOLATE, FERRITIN, TIBC, IRON, RETICCTPCT in the last 72 hours.    Radiology Studies: I have reviewed all of the imaging during this hospital visit personally     Scheduled Meds: . amLODipine  10 mg Oral Daily  . aspirin EC   325 mg Oral Daily  . atorvastatin  40 mg Oral q1800  . cloNIDine  0.2 mg Oral BID  . clopidogrel  75 mg Oral Daily  . folic acid  1 mg Oral Daily  . hydrochlorothiazide  25 mg Oral Daily  . thiamine  100 mg Oral Daily   Continuous Infusions: . sodium chloride    . sodium chloride 10 mL/hr at 05/26/17 1217     LOS: 6 days        Tawni Millers, MD [facility] Pager [phone]

## 2017-05-31 MED ORDER — POLYETHYLENE GLYCOL 3350 17 G PO PACK
17.0000 g | PACK | Freq: Every day | ORAL | Status: DC | PRN
  Administered 2017-05-31 – 2017-06-01 (×2): 17 g via ORAL
  Filled 2017-05-31 (×3): qty 1

## 2017-05-31 NOTE — Progress Notes (Signed)
Notified MD of pt's pulse rate of 46 bpm

## 2017-05-31 NOTE — Progress Notes (Signed)
   05/31/17 2100  PT Visit Information  Last PT Received On 05/31/17  Assistance Needed +1  History of Present Illness Pt is a 67 y/o male admitted secondary to R sided weakness. CT head demonstrated new lacunar infarcts in left corona and superior basal ganglia along with chronic b/l basal ganglia infarcts. PMH including but not limited HTN, polysubstance abuse and prior CVA in 2012.  Subjective Data  Subjective Reports he is not interested in getting up to the chair but will agree to some exercise  Patient Stated Goal return to independence  Precautions  Precautions Fall  Precaution Comments R leg is supported by pillow on the bed  Restrictions  Weight Bearing Restrictions No  Pain Assessment  Pain Assessment No/denies pain  Cognition  Arousal/Alertness Awake/alert  Behavior During Therapy Flat affect  Overall Cognitive Status Impaired/Different from baseline  Area of Impairment Following commands;Awareness;Safety/judgement;Problem solving  Following Commands Follows one step commands with increased time  Safety/Judgement Decreased awareness of safety;Decreased awareness of deficits  Awareness Intellectual  Problem Solving Decreased initiation;Slow processing;Requires verbal cues;Requires tactile cues  General Comments flat on R side expression   Bed Mobility  Overal bed mobility Needs Assistance  Bed Mobility Rolling  Rolling Min assist  Transfers  General transfer comment waited  Balance  Overall balance assessment Needs assistance  Sitting-balance support Feet supported;Single extremity supported  Sitting balance-Leahy Scale Fair  Sitting balance - Comments not attempted  Exercises  Exercises General Lower Extremity  General Exercises - Lower Extremity  Ankle Circles/Pumps AROM;Both;15 reps  Quad Sets AROM;Both;10 reps  Heel Slides AROM;AAROM;Both;15 reps  Hip ABduction/ADduction AROM;AAROM;15 reps;Seated  PT - End of Session  Equipment Utilized During Treatment Gait  belt;Oxygen  Activity Tolerance Patient limited by fatigue;Patient limited by lethargy  Patient left with call bell/phone within reach;Other (comment)  Nurse Communication Mobility status  PT - Assessment/Plan  PT Plan Current plan remains appropriate  PT Visit Diagnosis Other abnormalities of gait and mobility (R26.89);Other symptoms and signs involving the nervous system (R29.898)  PT Frequency (ACUTE ONLY) Min 4X/week  Recommendations for Other Services Rehab consult;OT consult  Follow Up Recommendations CIR;Supervision/Assistance - 24 hour  PT equipment None recommended by PT  AM-PAC PT "6 Clicks" Daily Activity Outcome Measure  Difficulty turning over in bed (including adjusting bedclothes, sheets and blankets)? 1  Difficulty moving from lying on back to sitting on the side of the bed?  1  Difficulty sitting down on and standing up from a chair with arms (e.g., wheelchair, bedside commode, etc,.)? 1  Help needed moving to and from a bed to chair (including a wheelchair)? 2  Help needed walking in hospital room? 2  Help needed climbing 3-5 steps with a railing?  1  6 Click Score 8  Mobility G Code  CM  PT Goal Progression  Progress towards PT goals Progressing toward goals  Acute Rehab PT Goals  PT Goal Formulation With patient  PT Time Calculation  PT Start Time (ACUTE ONLY) 1644  PT Stop Time (ACUTE ONLY) 1701  PT Time Calculation (min) (ACUTE ONLY) 17 min  PT G-Codes **NOT FOR INPATIENT CLASS**  Functional Assessment Tool Used AM-PAC 6 Clicks Basic Mobility  PT General Charges  $$ ACUTE PT VISIT 1 Visit  PT Treatments  $Therapeutic Exercise 8-22 mins   Mee Hives, PT MS Acute Rehab Dept. Number: [place] and [place]

## 2017-05-31 NOTE — Progress Notes (Signed)
PROGRESS NOTE    Lucas [name]  BPZ:[id number] DOB: [birth year] DOA: 05/24/2017 PCP: Patient, No Pcp Per    Brief Narrative:  67 year old male who presented with right-sided weakness. Patient does have a significant past medical history of CVA 2012, dyslipidemia, hypertension and polysubstance abuse. Patient presented with weakness for 24 hours prior to hospitalization, his symptoms were persistent.On his initial physical examination blood pressure 223/80, heart rate 63, temperature 98, respiratory 23, oxygen saturation 96%.He was awake and alert, difficulty with speech, right-sided weakness, ataxia, lungs were clear to auscultation, heart S1-S2 present rhythmic, the abdomen was soft nontender, no lower extremity edema. Sodium 143, potassium 4.0, chloride 1 8, bicarb 20, glucose 84, BUN 24, creatinine 3.0, white count 9.2, Humulin 10.4, hematocrit 31.8, platelets 244.Urinalysis negative for infection. Drug screen positive for cocaine.Head CT with lacunar infarcts in the left corona radiata, superior basal ganglia, possibly acute to subacute. EKG sinus rhythm, 57 bpm, positive LVH.  Patient was admitted to the hospital with a working diagnosis of acute hypertensive emergency complicated by acute CVA.   Assessment & Plan:   Active Problems:   Smoker   Malignant hypertension   Cerebrovascular accident (CVA) ([place])   Cocaine abuse ([place])   Noncompliance w/medication treatment due to intermit use of medication   Middle cerebral artery stenosis, right   Stenosis of right carotid artery   1. Acute lacunar infarct. Severe hemiparesis, neuro checks per unit protocol, medical therapy with dualantiplatelet therapy with asa and clopidogrel. Continue atorvastatin. Telemetry has been sinus rhythm, will discontinue cardiac monitoring.   2. Hypertensive emergency/ resolved. Continue with clonidine, hctz and amlodipine, systolic blood pressure 242.   3. Diastolic heart failure.No signs of  exacerbation. Continue blood pressure control.   4. CKDstage 4. Avoid nephrotoxic medications or hypotension.   5. Cocaine abuse.No current clinical signs of withdrawal.   6. Dyslipidemia.On atorvastatin.    DVT prophylaxis:scd Code Status:full Family Communication:no family at the bedside Disposition Plan:snf   Consultants:  Neurology  Procedures:    Antimicrobials:     Subjective: Unchanged with persistent right hemiparesis, no nausea or vomiting, no fever or chills, tolerating po well.   Objective: Vitals:   05/30/17 1351 05/30/17 2134 05/31/17 0653 05/31/17 0854  BP: (!) 145/53 (!) 141/58 (!) 136/46 (!) 181/71  Pulse: (!) 42 (!) 51 (!) 39   Resp: (!) 24 (!) 23    Temp: 97.7 F (36.5 C) 99 F (37.2 C) 98.2 F (36.8 C)   TempSrc: Oral Oral Axillary   SpO2: 100% 100% 100%   Weight:   61.5 kg (135 lb 9.3 oz)   Height:        Intake/Output Summary (Last 24 hours) at 05/31/2017 1111 Last data filed at 05/31/2017 0930 Gross per 24 hour  Intake 1200 ml  Output 1150 ml  Net 50 ml   Filed Weights   05/29/17 0328 05/30/17 0500 05/31/17 0653  Weight: 62.6 kg (138 lb 0.1 oz) 61.4 kg (135 lb 5.8 oz) 61.5 kg (135 lb 9.3 oz)    Examination:   General: Not in pain or dyspnea, deconditioned Neurology: Awake and alert, persistent right hemiparesis.  E ENT: mild pallor, no icterus, oral mucosa moist Cardiovascular: No JVD. S1-S2 present, rhythmic, no gallops, rubs, or murmurs. No lower extremity edema. Pulmonary: vesicular breath sounds bilaterally, adequate air movement, no wheezing, rhonchi or rales. Gastrointestinal. Abdomen flat, no organomegaly, non tender, no rebound or guarding Skin. No rashes Musculoskeletal: no joint deformities     Data Reviewed:  I have personally reviewed following labs and imaging studies  CBC: Recent Labs  Lab 05/25/17 0251  WBC 9.2  HGB 10.4*  HCT 31.8*  MCV 79.5  PLT 334   Basic Metabolic  Panel: Recent Labs  Lab 05/24/17 1244 05/25/17 0251 05/26/17 0554 05/26/17 2352 05/28/17 0531 05/30/17 0627  NA 143 140 140 141 140 139  K 4.0 3.8 3.9 4.3 4.5 4.6  CL 108 106 110 113* 110 107  CO2 20* 22 20* 21* 20* 23  GLUCOSE 84 94 135* 126* 97 111*  BUN 24* 26* 25* 26* 32* 36*  CREATININE 3.01* 3.05* 2.96* 3.05* 3.41* 3.29*  CALCIUM 8.8* 8.4* 7.8* 7.5* 8.0* 8.2*  MG 2.1 2.0 1.9  --  2.0  --   PHOS 3.6 3.7 3.4  --   --   --    GFR: Estimated Creatinine Clearance: 19.7 mL/min (A) (by C-G formula based on SCr of 3.29 mg/dL (H)). Liver Function Tests: Recent Labs  Lab 05/24/17 1244  AST 13*  ALT 8*  ALKPHOS 77  BILITOT 0.7  PROT 6.8  ALBUMIN 3.1*   No results for input(s): LIPASE, AMYLASE in the last 168 hours. No results for input(s): AMMONIA in the last 168 hours. Coagulation Profile: Recent Labs  Lab 05/24/17 1244  INR 1.07   Cardiac Enzymes: Recent Labs  Lab 05/24/17 1244 05/24/17 1755 05/25/17 0015 05/26/17 1314 05/26/17 2352  TROPONINI 0.04* 0.08* 0.11* 0.04* 0.04*   BNP (last 3 results) No results for input(s): PROBNP in the last 8760 hours. HbA1C: No results for input(s): HGBA1C in the last 72 hours. CBG: Recent Labs  Lab 05/24/17 1516  GLUCAP 131*   Lipid Profile: No results for input(s): CHOL, HDL, LDLCALC, TRIG, CHOLHDL, LDLDIRECT in the last 72 hours. Thyroid Function Tests: No results for input(s): TSH, T4TOTAL, FREET4, T3FREE, THYROIDAB in the last 72 hours. Anemia Panel: No results for input(s): VITAMINB12, FOLATE, FERRITIN, TIBC, IRON, RETICCTPCT in the last 72 hours.    Radiology Studies: I have reviewed all of the imaging during this hospital visit personally     Scheduled Meds: . amLODipine  10 mg Oral Daily  . aspirin EC  325 mg Oral Daily  . atorvastatin  40 mg Oral q1800  . cloNIDine  0.2 mg Oral BID  . clopidogrel  75 mg Oral Daily  . folic acid  1 mg Oral Daily  . hydrochlorothiazide  25 mg Oral Daily  .  thiamine  100 mg Oral Daily   Continuous Infusions: . sodium chloride    . sodium chloride 10 mL/hr at 05/26/17 1217     LOS: 7 days        Tawni Millers, MD [facility] Pager [phone]

## 2017-06-01 NOTE — Progress Notes (Incomplete)
Clinical Social Worker following patient for disposition needs. Patient had one bed offer from [facility] but after looking over

## 2017-06-01 NOTE — Progress Notes (Signed)
Occupational Therapy Treatment Patient Details Name: Lucas [name] MRN: [id number] DOB: [birth year] Today's Date: 06/01/2017    History of present illness Pt is a 67 y/o male admitted secondary to R sided weakness. CT head demonstrated new lacunar infarcts in left corona and superior basal ganglia along with chronic b/l basal ganglia infarcts. PMH including but not limited HTN, polysubstance abuse and prior CVA in 2012.   OT comments  Pt with flat affect, upset that he is so dependent on others. Assisted pt to [facility] with NT with +2 moderate assist and transferred back to bed with +1 max assist. Performed AAROM R UE and elevated on 2 pillows. Educated pt in edema management. Pt demonstrating ability to sit statically, but unable to participate in activity in unsupported sitting. Continue to recommend CIR.  Follow Up Recommendations  CIR;Supervision/Assistance - 24 hour    Equipment Recommendations       Recommendations for Other Services      Precautions / Restrictions Precautions Precautions: Fall       Mobility Bed Mobility Overal bed mobility: Needs Assistance Bed Mobility: Supine to Sit;Sit to Supine     Supine to sit: Mod assist;HOB elevated Sit to supine: Mod assist   General bed mobility comments: cues for sequencing, assist for R LE and trunk  Transfers                      Balance Overall balance assessment: Needs assistance   Sitting balance-Leahy Scale: Poor Sitting balance - Comments: maintains static sitting for 1-2 minutes, tends to fall L     Standing balance-Leahy Scale: Zero                             ADL either performed or assessed with clinical judgement   ADL Overall ADL's : Needs assistance/impaired Eating/Feeding: Set up;Bed level Eating/Feeding Details (indicate cue type and reason): to open containers Grooming: Wash/dry face;Bed level;Set up                   Toilet Transfer: +2 for physical assistance;Moderate  assistance;Stand-pivot   Toileting- Clothing Manipulation and Hygiene: Maximal assistance;Sitting/lateral lean               Vision   Additional Comments: R inattention   Perception     Praxis      Cognition Arousal/Alertness: Awake/alert Behavior During Therapy: Flat affect Overall Cognitive Status: Impaired/Different from baseline Area of Impairment: Following commands;Safety/judgement;Problem solving                       Following Commands: Follows one step commands with increased time Safety/Judgement: Decreased awareness of safety;Decreased awareness of deficits   Problem Solving: Decreased initiation;Slow processing;Requires verbal cues;Requires tactile cues          Exercises Exercises: General Upper Extremity General Exercises - Upper Extremity Shoulder Flexion: AAROM;Right;10 reps;Supine Elbow Flexion: AROM;Right;10 reps;Supine Elbow Extension: AAROM;Right;10 reps;Supine Wrist Flexion: AAROM;Right;10 reps;Supine Wrist Extension: AAROM;Right;10 reps;Supine Digit Composite Flexion: AAROM;Right;10 reps;Supine   Shoulder Instructions       General Comments      Pertinent Vitals/ Pain       Pain Assessment: No/denies pain  Home Living  Prior Functioning/Environment              Frequency  Min 2X/week        Progress Toward Goals  OT Goals(current goals can now be found in the care plan section)  Progress towards OT goals: Progressing toward goals  Acute Rehab OT Goals Patient Stated Goal: return to independence OT Goal Formulation: With patient Time For Goal Achievement: 06/11/17 Potential to Achieve Goals: Good  Plan Discharge plan remains appropriate    Co-evaluation                 AM-PAC PT "6 Clicks" Daily Activity     Outcome Measure   Help from another person eating meals?: A Little Help from another person taking care of personal grooming?: A  Little Help from another person toileting, which includes using toliet, bedpan, or urinal?: A Lot Help from another person bathing (including washing, rinsing, drying)?: A Lot Help from another person to put on and taking off regular upper body clothing?: A Lot Help from another person to put on and taking off regular lower body clothing?: A Lot 6 Click Score: 14    End of Session Equipment Utilized During Treatment: Gait belt  OT Visit Diagnosis: Unsteadiness on feet (R26.81);Hemiplegia and hemiparesis Hemiplegia - Right/Left: Right Hemiplegia - dominant/non-dominant: Dominant Hemiplegia - caused by: Cerebral infarction   Activity Tolerance Patient tolerated treatment well   Patient Left in bed;with call bell/phone within reach;with bed alarm set;with nursing/sitter in room   Nurse Communication          Time: 7412-8786 OT Time Calculation (min): 26 min  Charges: OT General Charges $OT Visit: 1 Visit OT Treatments $Self Care/Home Management : 8-22 mins $Neuromuscular Re-education: 8-22 mins  06/01/2017 Nestor Lewandowsky, OTR/L Pager: [phone]   [name] 06/01/2017, 12:34 PM

## 2017-06-01 NOTE — Progress Notes (Signed)
PROGRESS NOTE    KYL GIVLER  OIZ:[id number] DOB: Oct 28, 1950 DOA: 05/24/2017 PCP: Patient, No Pcp Per    Brief Narrative:  67 year old male who presented with right-sided weakness. Patient does have a significant past medical history of CVA 2012, dyslipidemia, hypertension and polysubstance abuse. Patient presented with weakness for 24 hours prior to hospitalization, his symptoms were persistent.On his initial physical examination blood pressure 223/80, heart rate 63, temperature 98, respiratory 23, oxygen saturation 96%.He was awake and alert, difficulty with speech, right-sided weakness, ataxia, lungs were clear to auscultation, heart S1-S2 present rhythmic, the abdomen was soft nontender, no lower extremity edema. Sodium 143, potassium 4.0, chloride 1 8, bicarb 20, glucose 84, BUN 24, creatinine 3.0, white count 9.2, Humulin 10.4, hematocrit 31.8, platelets 244.Urinalysis negative for infection. Drug screen positive for cocaine.Head CT with lacunar infarcts in the left corona radiata, superior basal ganglia, possibly acute to subacute. EKG sinus rhythm, 57 bpm, positive LVH.  Patient was admitted to the hospital with a working diagnosis of acute hypertensive emergency complicated by acute CVA.   Assessment & Plan:   Active Problems:   Smoker   Malignant hypertension   Cerebrovascular accident (CVA) ([place])   Cocaine abuse ([place])   Noncompliance w/medication treatment due to intermit use of medication   Middle cerebral artery stenosis, right   Stenosis of right carotid artery   1. Acute lacunar infarct. Patient continue with severehemiparesis, continue neuro checksper unit protocol and physical therapy. Continue dualantiplatelet therapy with asa and clopidogrel. Statin with atorvastatin. Waiting for placement.   2. Hypertensive emergency/ resolved.Onclonidine, hctz and amlodipine, with good blood pressure control, systolic 338.   3. Diastolic heart  failure.Clinically stable with nosigns of exacerbation.   4. CKDstage 4. Stable renal function will continue to avoid nephrotoxic agents or hypotension.  5. Cocaine abuse.No signs ofwithdrawal. No anxiety or confusion.   6. Dyslipidemia.Continue atorvastatin.   DVT prophylaxis:scd Code Status:full Family Communication:no family at the bedside Disposition Plan:snf   Consultants:  Neurology  Procedures:    Antimicrobials:    Subjective: Patient is feeling well, no nausea or vomiting, has been bradycardic ( asymptomatic), no chest pain or dyspnea.   Objective: Vitals:   05/31/17 2202 06/01/17 0604 06/01/17 0605 06/01/17 0943  BP:   (!) 154/51 (!) 159/58  Pulse:   (!) 43 (!) 42  Resp:   17   Temp: 98.8 F (37.1 C)  98.3 F (36.8 C)   TempSrc: Oral  Oral   SpO2: 100%  100% 100%  Weight:  56.9 kg (125 lb 7.1 oz)    Height:        Intake/Output Summary (Last 24 hours) at 06/01/2017 1035 Last data filed at 06/01/2017 0911 Gross per 24 hour  Intake -  Output 1500 ml  Net -1500 ml   Filed Weights   05/30/17 0500 05/31/17 0653 06/01/17 0604  Weight: 61.4 kg (135 lb 5.8 oz) 61.5 kg (135 lb 9.3 oz) 56.9 kg (125 lb 7.1 oz)    Examination:   General: Not in pain or dyspnea, deconditioned Neurology: Awake. Right hemiparesis, persistent.  E ENT: mild pallor, no icterus, oral mucosa moist Cardiovascular: No JVD. S1-S2 present, rhythmic, no gallops, rubs, or murmurs. No lower extremity edema. Pulmonary: vesicular breath sounds bilaterally, adequate air movement, no wheezing, rhonchi or rales. Gastrointestinal. Abdomen flat, no organomegaly, non tender, no rebound or guarding Skin. No rashes Musculoskeletal: no joint deformities     Data Reviewed: I have personally reviewed following labs and imaging studies  CBC: No results for input(s): WBC, NEUTROABS, HGB, HCT, MCV, PLT in the last 168 hours. Basic Metabolic Panel: Recent Labs  Lab  05/26/17 0554 05/26/17 2352 05/28/17 0531 05/30/17 0627  NA 140 141 140 139  K 3.9 4.3 4.5 4.6  CL 110 113* 110 107  CO2 20* 21* 20* 23  GLUCOSE 135* 126* 97 111*  BUN 25* 26* 32* 36*  CREATININE 2.96* 3.05* 3.41* 3.29*  CALCIUM 7.8* 7.5* 8.0* 8.2*  MG 1.9  --  2.0  --   PHOS 3.4  --   --   --    GFR: Estimated Creatinine Clearance: 18.3 mL/min (A) (by C-G formula based on SCr of 3.29 mg/dL (H)). Liver Function Tests: No results for input(s): AST, ALT, ALKPHOS, BILITOT, PROT, ALBUMIN in the last 168 hours. No results for input(s): LIPASE, AMYLASE in the last 168 hours. No results for input(s): AMMONIA in the last 168 hours. Coagulation Profile: No results for input(s): INR, PROTIME in the last 168 hours. Cardiac Enzymes: Recent Labs  Lab 05/26/17 1314 05/26/17 2352  TROPONINI 0.04* 0.04*   BNP (last 3 results) No results for input(s): PROBNP in the last 8760 hours. HbA1C: No results for input(s): HGBA1C in the last 72 hours. CBG: No results for input(s): GLUCAP in the last 168 hours. Lipid Profile: No results for input(s): CHOL, HDL, LDLCALC, TRIG, CHOLHDL, LDLDIRECT in the last 72 hours. Thyroid Function Tests: No results for input(s): TSH, T4TOTAL, FREET4, T3FREE, THYROIDAB in the last 72 hours. Anemia Panel: No results for input(s): VITAMINB12, FOLATE, FERRITIN, TIBC, IRON, RETICCTPCT in the last 72 hours.    Radiology Studies: I have reviewed all of the imaging during this hospital visit personally     Scheduled Meds: . amLODipine  10 mg Oral Daily  . aspirin EC  325 mg Oral Daily  . atorvastatin  40 mg Oral q1800  . cloNIDine  0.2 mg Oral BID  . clopidogrel  75 mg Oral Daily  . folic acid  1 mg Oral Daily  . hydrochlorothiazide  25 mg Oral Daily  . thiamine  100 mg Oral Daily   Continuous Infusions: . sodium chloride    . sodium chloride 10 mL/hr at 05/26/17 1217     LOS: 8 days        Tawni Millers, MD [facility] Pager  [phone]

## 2017-06-01 NOTE — Plan of Care (Signed)
Progressing

## 2017-06-02 MED ORDER — FOLIC ACID 1 MG PO TABS: 1.0000 mg | ORAL_TABLET | Freq: Every day | ORAL | Status: DC

## 2017-06-02 MED ORDER — CLONIDINE HCL 0.2 MG PO TABS: 0.2000 mg | ORAL_TABLET | Freq: Two times a day (BID) | ORAL | Status: DC

## 2017-06-02 MED ORDER — CLOPIDOGREL BISULFATE 75 MG PO TABS: 75.0000 mg | ORAL_TABLET | Freq: Every day | ORAL | Status: AC

## 2017-06-02 MED ORDER — ATORVASTATIN CALCIUM 40 MG PO TABS: 40.0000 mg | ORAL_TABLET | Freq: Every day | ORAL | Status: AC

## 2017-06-02 MED ORDER — ASPIRIN 325 MG PO TBEC: 325.0000 mg | DELAYED_RELEASE_TABLET | Freq: Every day | ORAL | 0 refills | Status: DC

## 2017-06-02 MED ORDER — HYDROCHLOROTHIAZIDE 25 MG PO TABS: 25.0000 mg | ORAL_TABLET | Freq: Every day | ORAL | Status: DC

## 2017-06-02 NOTE — Progress Notes (Signed)
Inpatient Rehabilitation  Per PT/OT request, patient was screened by Gunnar Fusi for appropriateness for an Inpatient Acute Rehab consult.  At this time we are recommending an Inpatient Rehab consult to assist with discharge planning.  Text paged MD to notify given ongoing therapy needs; please order if you are agreeable.    Carmelia Roller., CCC/SLP Admission Coordinator  [place]  Cell [phone]

## 2017-06-02 NOTE — Plan of Care (Signed)
Progressing

## 2017-06-02 NOTE — Progress Notes (Signed)
CSW spoke with patient about SNF bed offer at [facility]. He reported that he does not want to give up his Medicaid check to go. He asked CSW to call his daughter even though she asked not to be contacted. CSW contacted her and she agreed that there is nothing she can do for the patient and that she warned him that he would need to give up his check. She suggested CSW call patient's sister. Patient's sister and niece state they will come up with a plan for the pt but cannot do so until tomorrow. RNCM updated. CSW consulted with CSW AD who states patient will have to plan for home tomorrow. Medical director also paged.  Percell Locus Tirrell Buchberger LCSW [phone]

## 2017-06-02 NOTE — Discharge Summary (Signed)
Physician Discharge Summary  Lucas [name]  DVV:[id number]  DOB: 14-Jun-1950  DOA: 05/24/2017 PCP: Patient, No Pcp Per  Admit date: 05/24/2017 Discharge date: 06/02/2017  Admitted From: Home Disposition: SNF  Recommendations for Outpatient Follow-up:  1. Follow up with PCP in 1-2 weeks 2. Follow-up with neurology in 6 weeks 3. Please obtain BMP/CBC in one week monitor creatinine and hemoglobin 4. Recommend nephrology outpatient evaluation.  Discharge Condition: Stable CODE STATUS: Full code Diet recommendation: Heart Healthy   Brief/Interim Summary: For full details see H&P/Progress note, but in brief, Lucas [name] is a 67 y/o M with PMHx polysubstance abuse, hypertension, CVA on 2012 and dyslipidemia presented to the emergency department complaining of right-sided weakness.  Upon ED evaluation was found to have severely elevated blood pressure with systolic BP up to 626.  CT of the head showed lacunar infarct in the left corona radiata and superior basal ganglia.  Patient was admitted with working diagnosis of extensive emergency complicated by acute CVA.  Stroke workup was completed and was unremarkable including echo and vascular carotid Doppler.  Patient remained with right-sided weakness, and physical therapy recommended SNF for rehab.  Blood pressure was controlled and patient deemed stable to be discharged to SNF.   Subjective: Patient seen and examined, he has no new complaints.  Weakness remain the same.  Denies chest pain, shortness of breath, palpitations and dizziness.  No acute events overnight.  Patient remains afebrile.  Discharge Diagnoses/Hospital Course:  Acute CVA MRI shows acute subcentimeter nonhemorrhagic left thalamic infarct, left frontal lobe punctate infarct, old bilateral basal ganglia and thalamus infarct and old pontine lacunar infarct. MRI head shows severe stenosis right ICA is cavernous segment.  Slow flow right MCA with severe stenosis right M2  origin. Resultant deficit - right-sided weakness Echocardiogram unremarkable Carotid Doppler unremarkable Elevated LDL continue statin Continue dual antiplatelet ASA and Plavix Follow-up with neurology in 6 weeks  Hypertensive emergency - resolved Continue clonidine, HCTZ and amlodipine Follow-up as an outpatient  Chronic diastolic heart failure Clinically stable no signs of fluid overload  CKD stage IV Renal function stable Avoid nephrotoxins and hypotension Recommend nephrology outpatient evaluation.  Dyslipidemia Continue statin  Cocaine abuse Cessation discussed  All other chronic medical condition were stable during the hospitalization.  Patient was seen by physical therapy, recommending SNF On the day of the discharge the patient's vitals were stable, and no other acute medical condition were reported by patient. the patient was felt safe to be discharge to SNF  Discharge Instructions  You were cared for by a hospitalist during your hospital stay. If you have any questions about your discharge medications or the care you received while you were in the hospital after you are discharged, you can call the unit and asked to speak with the hospitalist on call if the hospitalist that took care of you is not available. Once you are discharged, your primary care physician will handle any further medical issues. Please note that NO REFILLS for any discharge medications will be authorized once you are discharged, as it is imperative that you return to your primary care physician (or establish a relationship with a primary care physician if you do not have one) for your aftercare needs so that they can reassess your need for medications and monitor your lab values.  Discharge Instructions    Ambulatory referral to Neurology   Complete by:  As directed    An appointment is requested in approximately: 6 weeks Follow up with stroke  clinic Cecille Rubin preferred, if not available,  then consider Caesar Chestnut, [facility] or Jaynee Eagles whoever is available) at [facility] in about 6-8 weeks. Thanks.   Call MD for:  difficulty breathing, headache or visual disturbances   Complete by:  As directed    Call MD for:  extreme fatigue   Complete by:  As directed    Call MD for:  hives   Complete by:  As directed    Call MD for:  persistant dizziness or light-headedness   Complete by:  As directed    Call MD for:  persistant nausea and vomiting   Complete by:  As directed    Call MD for:  redness, tenderness, or signs of infection (pain, swelling, redness, odor or green/yellow discharge around incision site)   Complete by:  As directed    Call MD for:  severe uncontrolled pain   Complete by:  As directed    Call MD for:  temperature >100.4   Complete by:  As directed    Diet - low sodium heart healthy   Complete by:  As directed    Increase activity slowly   Complete by:  As directed      Allergies as of 06/02/2017      Reactions   Pork-derived Products Other (See Comments)   Patient does not eat pork products.      Medication List    STOP taking these medications   aspirin 81 MG tablet Replaced by:  aspirin 325 MG EC tablet   lisinopril-hydrochlorothiazide 20-12.5 MG tablet Commonly known as:  ZESTORETIC     TAKE these medications   amLODipine 10 MG tablet Commonly known as:  NORVASC Take 1 tablet (10 mg total) by mouth daily.   aspirin 325 MG EC tablet Take 1 tablet (325 mg total) by mouth daily. Start taking on:  06/03/2017 Replaces:  aspirin 81 MG tablet   atorvastatin 40 MG tablet Commonly known as:  LIPITOR Take 1 tablet (40 mg total) by mouth daily at 6 PM. What changed:  when to take this   cloNIDine 0.2 MG tablet Commonly known as:  CATAPRES Take 1 tablet (0.2 mg total) by mouth 2 (two) times daily.   clopidogrel 75 MG tablet Commonly known as:  PLAVIX Take 1 tablet (75 mg total) by mouth daily. Start taking on:  4/40/1027   folic acid 1 MG  tablet Commonly known as:  FOLVITE Take 1 tablet (1 mg total) by mouth daily. Start taking on:  06/03/2017   hydrochlorothiazide 25 MG tablet Commonly known as:  HYDRODIURIL Take 1 tablet (25 mg total) by mouth daily. Start taking on:  06/03/2017      Follow-up Information    Dennie Bible, NP. Schedule an appointment as soon as possible for a visit in 6 week(s).   Specialty:  Family Medicine Contact information: [address] [place] [address] [phone]          Allergies  Allergen Reactions  . Pork-Derived Products Other (See Comments)    Patient does not eat pork products.    Consultations:  Neurology   Procedures/Studies: Ct Head Wo Contrast  Result Date: 05/24/2017 CLINICAL DATA:  Right-sided weakness beginning yesterday. History of stroke. EXAM: CT HEAD WITHOUT CONTRAST TECHNIQUE: Contiguous axial images were obtained from the base of the skull through the vertex without intravenous contrast. COMPARISON:  Head CT and MRI 04/24/2015 FINDINGS: Brain: There is an interval infarct anteriorly in the left basal ganglia which  is chronic. Additional new lacunar infarcts in the left corona radiata and superior left lentiform nucleus region are less well defined and may be acute or subacute (series 2, images 18 and 20). Additional chronic infarcts are again seen involving the right basal ganglia, thalami, and deep cerebral white matter bilaterally. There is mild ex vacuo enlargement of the frontal horns of both lateral ventricles. There is no evidence of acute large territory infarct, intracranial hemorrhage, mass, midline shift, or extra-axial fluid collection. There is mild cerebral atrophy. Mild periventricular white matter hypodensities bilaterally are compatible with chronic small vessel ischemic disease. Vascular: Calcified atherosclerosis at the skull base. No hyperdense vessel. Skull: No fracture focal osseous lesion. Sinuses/Orbits: Mild posterior  left ethmoid air cell opacification. Clear mastoid air cells. Unremarkable orbits. Other: None. IMPRESSION: 1. No evidence of acute large territory infarct or intracranial hemorrhage. 2. New lacunar infarcts in the left corona radiata/superior basal ganglia, possibly acute or subacute. 3. 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 Electronically Signed   By: Logan Bores M.D.   On: 05/24/2017 11:21   Mr Jodene Nam Head Wo Contrast  Result Date: 05/26/2017 CLINICAL DATA:  RIGHT-sided weakness beginning today. Not taking hypertensive medication. Recent cocaine use. History of hypertension, polysubstance abuse, hyperlipidemia and stroke. EXAM: MRI HEAD WITHOUT CONTRAST MRA HEAD WITHOUT CONTRAST TECHNIQUE: Multiplanar, multiecho pulse sequences of the brain and surrounding structures were obtained without intravenous contrast. Angiographic images of the head were obtained using MRA technique without contrast. COMPARISON:  CT HEAD May 24, 2017 and MRI of the head April 24, 2015 FINDINGS: MRI HEAD FINDINGS INTRACRANIAL CONTENTS: Acute subcentimeter reduced diffusion with low ADC values lateral LEFT thalamus. Punctate focus of reduced diffusion LEFT frontal lobe, too small to characterize on ADC map. Old bilateral basal ganglia and RIGHT thalamus infarcts. Ex vacuo dilatation lateral ventricles. Scattered chronic micro hemorrhages in a pattern seen with chronic hypertension. Moderate global parenchymal brain volume loss. No hydrocephalus. Old pontine lacunar infarcts. Patchy to confluent supratentorial white matter FLAIR T2 hyperintensities. No midline shift, mass effect or masses. No abnormal extra-axial fluid collections. VASCULAR: Normal major intracranial vascular flow voids present at skull base. SKULL AND UPPER CERVICAL SPINE: No abnormal sellar expansion. No suspicious calvarial bone marrow signal. Craniocervical junction maintained. SINUSES/ORBITS: Mild paranasal sinus  mucosal thickening without air-fluid levels. Mastoid air cells are well aerated.The included ocular globes and orbital contents are non-suspicious. OTHER: None. MRA HEAD FINDINGS ANTERIOR CIRCULATION: Patent new bilateral internal carotid artery's with severe stenosis RIGHT cavernous ICA, RIGHT supraclinoid ICA. Slow flow RIGHT middle cerebral artery, severe stenosis RIGHT M2 segment inferior division. Patent anterior middle cerebral arteries. No large vessel occlusion, flow limiting stenosis, aneurysm. POSTERIOR CIRCULATION: Codominant vertebral arteries. Fenestrated proximal basilar artery. Basilar artery is patent, with normal flow related enhancement of the main branch vessels. Patent posterior cerebral arteries palpable luminal irregularity compatible with atherosclerosis. No large vessel occlusion, flow limiting stenosis,  aneurysm. ANATOMIC VARIANTS: Hypoplastic RIGHT A1 segment. Supernumerary anterior cerebral artery arising from LEFT A1-2 junction. Source images and MIP images were reviewed. IMPRESSION: MRI HEAD: 1. Acute subcentimeter nonhemorrhagic LEFT thalamus infarct. Acute versus subacute LEFT frontal lobe punctate infarct. 2. Old bilateral basal ganglia and thalamus infarcts. Old pontine lacunar infarcts. 3. Moderate chronic small vessel ischemic disease. 4. Moderate parenchymal brain volume loss. MRA HEAD: 1. No emergent large vessel occlusion. 2. Severe stenosis RIGHT ICA cavernous segment. Slow flow RIGHT MCA with severe stenosis RIGHT M2 origin. Electronically Signed   By: Sandie Ano  Bloomer M.D.   On: 05/26/2017 04:15   Mr Brain Wo Contrast  Result Date: 05/26/2017 CLINICAL DATA:  RIGHT-sided weakness beginning today. Not taking hypertensive medication. Recent cocaine use. History of hypertension, polysubstance abuse, hyperlipidemia and stroke. EXAM: MRI HEAD WITHOUT CONTRAST MRA HEAD WITHOUT CONTRAST TECHNIQUE: Multiplanar, multiecho pulse sequences of the brain and surrounding structures  were obtained without intravenous contrast. Angiographic images of the head were obtained using MRA technique without contrast. COMPARISON:  CT HEAD May 24, 2017 and MRI of the head April 24, 2015 FINDINGS: MRI HEAD FINDINGS INTRACRANIAL CONTENTS: Acute subcentimeter reduced diffusion with low ADC values lateral LEFT thalamus. Punctate focus of reduced diffusion LEFT frontal lobe, too small to characterize on ADC map. Old bilateral basal ganglia and RIGHT thalamus infarcts. Ex vacuo dilatation lateral ventricles. Scattered chronic micro hemorrhages in a pattern seen with chronic hypertension. Moderate global parenchymal brain volume loss. No hydrocephalus. Old pontine lacunar infarcts. Patchy to confluent supratentorial white matter FLAIR T2 hyperintensities. No midline shift, mass effect or masses. No abnormal extra-axial fluid collections. VASCULAR: Normal major intracranial vascular flow voids present at skull base. SKULL AND UPPER CERVICAL SPINE: No abnormal sellar expansion. No suspicious calvarial bone marrow signal. Craniocervical junction maintained. SINUSES/ORBITS: Mild paranasal sinus mucosal thickening without air-fluid levels. Mastoid air cells are well aerated.The included ocular globes and orbital contents are non-suspicious. OTHER: None. MRA HEAD FINDINGS ANTERIOR CIRCULATION: Patent new bilateral internal carotid artery's with severe stenosis RIGHT cavernous ICA, RIGHT supraclinoid ICA. Slow flow RIGHT middle cerebral artery, severe stenosis RIGHT M2 segment inferior division. Patent anterior middle cerebral arteries. No large vessel occlusion, flow limiting stenosis, aneurysm. POSTERIOR CIRCULATION: Codominant vertebral arteries. Fenestrated proximal basilar artery. Basilar artery is patent, with normal flow related enhancement of the main branch vessels. Patent posterior cerebral arteries palpable luminal irregularity compatible with atherosclerosis. No large vessel occlusion, flow limiting  stenosis,  aneurysm. ANATOMIC VARIANTS: Hypoplastic RIGHT A1 segment. Supernumerary anterior cerebral artery arising from LEFT A1-2 junction. Source images and MIP images were reviewed. IMPRESSION: MRI HEAD: 1. Acute subcentimeter nonhemorrhagic LEFT thalamus infarct. Acute versus subacute LEFT frontal lobe punctate infarct. 2. Old bilateral basal ganglia and thalamus infarcts. Old pontine lacunar infarcts. 3. Moderate chronic small vessel ischemic disease. 4. Moderate parenchymal brain volume loss. MRA HEAD: 1. No emergent large vessel occlusion. 2. Severe stenosis RIGHT ICA cavernous segment. Slow flow RIGHT MCA with severe stenosis RIGHT M2 origin. Electronically Signed   By: Elon Alas M.D.   On: 05/26/2017 04:15   Dg Chest Port 1 View  Result Date: 05/26/2017 CLINICAL DATA:  Respiratory failure. EXAM: PORTABLE CHEST 1 VIEW COMPARISON:  05/24/2017 FINDINGS: The cardiac silhouette remains enlarged. Aortic atherosclerosis is noted. Interstitial coarsening is similar to the prior study. No lobar consolidation, overt edema, sizable pleural effusion, or pneumothorax is identified. IMPRESSION: Cardiomegaly and chronic bronchitic changes without overt edema. Electronically Signed   By: Logan Bores M.D.   On: 05/26/2017 06:56   Dg Chest Portable 1 View  Result Date: 05/24/2017 CLINICAL DATA:  Malaise, no other symptoms. The patient is a poor historian. History of hypertension, hyperlipidemia, current smoker. EXAM: PORTABLE CHEST 1 VIEW COMPARISON:  PA and lateral chest x-ray of December 09, 2010 FINDINGS: The lungs are well-expanded. The interstitial markings are mildly prominent but stable. The cardiac silhouette is enlarged. The pulmonary vascularity is slightly more conspicuous today. The trachea is midline. There calcification in the wall of the aortic arch. The bony thorax exhibits no acute abnormality. IMPRESSION: Chronic bronchitic-smoking  related changes. Mild cardiomegaly with minimal central  pulmonary vascular prominence. This may reflect low-grade CHF. No acute pneumonia. Thoracic aortic atherosclerosis. Electronically Signed   By: David  Martinique M.D.   On: 05/24/2017 10:50   Vas US Carotid  Result Date: 05/25/2017 Carotid Arterial Duplex Study Indications: CVA. Examination Guidelines: A complete evaluation includes B-mode imaging, spectral doppler, color doppler, and power doppler as needed of all accessible portions of each vessel. Bilateral testing is considered an integral part of a complete examination. Limited examinations for reoccurring indications may be performed as noted.  Right Carotid Findings: +----------+--------+--------+--------+------------+--------+           PSV cm/sEDV cm/sStenosisDescribe    Comments +----------+--------+--------+--------+------------+--------+ CCA Prox  -81     -6                                   +----------+--------+--------+--------+------------+--------+ CCA Distal-104    -6                                   +----------+--------+--------+--------+------------+--------+ ICA Prox  -129    -17             heterogenous         +----------+--------+--------+--------+------------+--------+ ICA Distal-38     -12                                  +----------+--------+--------+--------+------------+--------+ ECA       -175    -9                                   +----------+--------+--------+--------+------------+--------+ +----------+--------+-------+--------+-------------------+           PSV cm/sEDV cmsDescribeArm Pressure (mmHG) +----------+--------+-------+--------+-------------------+ [id number]     7                                  +----------+--------+-------+--------+-------------------+ +---------+--------+--+--------+--+ VertebralPSV cm/s85EDV cm/s12 +---------+--------+--+--------+--+  Left Carotid Findings: +----------+--------+--------+--------+------------+--------+           PSV  cm/sEDV cm/sStenosisDescribe    Comments +----------+--------+--------+--------+------------+--------+ CCA Prox  138     24                                   +----------+--------+--------+--------+------------+--------+ CCA Distal-101    -23                                  +----------+--------+--------+--------+------------+--------+ ICA Prox  -92     -16             heterogenous         +----------+--------+--------+--------+------------+--------+ ICA Distal-68     -15                                  +----------+--------+--------+--------+------------+--------+ ECA       -115                                         +----------+--------+--------+--------+------------+--------+ +----------+--------+--------+--------+-------------------+  SubclavianPSV cm/sEDV cm/sDescribeArm Pressure (mmHG) +----------+--------+--------+--------+-------------------+           220                                         +----------+--------+--------+--------+-------------------+ +---------+--------+--+--------+--+ VertebralPSV cm/s58EDV cm/s18 +---------+--------+--+--------+--+  Final Interpretation: Right Carotid: Velocities in the right ICA are consistent with a 1-39% stenosis. Left Carotid: Velocities in the left ICA are consistent with a 1-39% stenosis. Vertebrals: Both vertebral arteries were patent with antegrade flow. *See table(s) above for measurements and observations.  Electronically signed by Antony Contras on 05/25/2017 at 5:38:39 PM.     Discharge Exam: Vitals:   06/02/17 0534 06/02/17 1325  BP: (!) 148/54 (!) 149/52  Pulse: (!) 41 (!) 41  Resp: 18 18  Temp: 98.2 F (36.8 C) 97.8 F (36.6 C)  SpO2: 100% 100%   Vitals:   06/01/17 1309 06/01/17 2119 06/02/17 0534 06/02/17 1325  BP: (!) 140/54 (!) 146/58 (!) 148/54 (!) 149/52  Pulse: (!) 58 (!) 44 (!) 41 (!) 41  Resp: 16 18 18 18   Temp: 97.9 F (36.6 C) 98.6 F (37 C) 98.2 F (36.8 C) 97.8 F  (36.6 C)  TempSrc:  Oral Oral Oral  SpO2: 100% 100% 100% 100%  Weight:      Height:        General: Pt is alert, awake, not in acute distress Cardiovascular: RRR, S1/S2 +, no rubs, no gallops Respiratory: CTA bilaterally, no wheezing, no rhonchi Extremities: no edema Neurology: Right side weakness, R lower extremity hemiparesis, right upper extremity minimal movement  The results of significant diagnostics from this hospitalization (including imaging, microbiology, ancillary and laboratory) are listed below for reference.     Microbiology: Recent Results (from the past 240 hour(s))  MRSA PCR Screening     Status: None   Collection Time: 05/24/17  1:31 PM  Result Value Ref Range Status   MRSA by PCR NEGATIVE NEGATIVE Final    Comment:        The GeneXpert MRSA Assay (FDA approved for NASAL specimens only), is one component of a comprehensive MRSA colonization surveillance program. It is not intended to diagnose MRSA infection nor to guide or monitor treatment for MRSA infections. Performed at [facility], [place] [address], [place], [place] [address]      Labs: BNP (last 3 results) No results for input(s): BNP in the last 8760 hours. Basic Metabolic Panel: Recent Labs  Lab 05/26/17 2352 05/28/17 0531 05/30/17 0627  NA 141 140 139  K 4.3 4.5 4.6  CL 113* 110 107  CO2 21* 20* 23  GLUCOSE 126* 97 111*  BUN 26* 32* 36*  CREATININE 3.05* 3.41* 3.29*  CALCIUM 7.5* 8.0* 8.2*  MG  --  2.0  --    Liver Function Tests: No results for input(s): AST, ALT, ALKPHOS, BILITOT, PROT, ALBUMIN in the last 168 hours. No results for input(s): LIPASE, AMYLASE in the last 168 hours. No results for input(s): AMMONIA in the last 168 hours. CBC: No results for input(s): WBC, NEUTROABS, HGB, HCT, MCV, PLT in the last 168 hours. Cardiac Enzymes: Recent Labs  Lab 05/26/17 2352  TROPONINI 0.04*   BNP: Invalid input(s): POCBNP CBG: No results for input(s): GLUCAP in the  last 168 hours. D-Dimer No results for input(s): DDIMER in the last 72 hours. Hgb A1c No results for input(s): HGBA1C in the  last 72 hours. Lipid Profile No results for input(s): CHOL, HDL, LDLCALC, TRIG, CHOLHDL, LDLDIRECT in the last 72 hours. Thyroid function studies No results for input(s): TSH, T4TOTAL, T3FREE, THYROIDAB in the last 72 hours.  Invalid input(s): FREET3 Anemia work up No results for input(s): VITAMINB12, FOLATE, FERRITIN, TIBC, IRON, RETICCTPCT in the last 72 hours. Urinalysis    Component Value Date/Time   COLORURINE YELLOW 05/24/2017 [place] 05/24/2017 0954   LABSPEC 1.014 05/24/2017 0954   PHURINE 6.0 05/24/2017 0954   GLUCOSEU 50 (A) 05/24/2017 0954   HGBUR SMALL (A) 05/24/2017 0954   BILIRUBINUR NEGATIVE 05/24/2017 0954   KETONESUR 5 (A) 05/24/2017 0954   PROTEINUR 100 (A) 05/24/2017 0954   UROBILINOGEN 0.2 12/09/2010 1117   NITRITE NEGATIVE 05/24/2017 0954   LEUKOCYTESUR NEGATIVE 05/24/2017 0954   Sepsis Labs Invalid input(s): PROCALCITONIN,  WBC,  LACTICIDVEN Microbiology Recent Results (from the past 240 hour(s))  MRSA PCR Screening     Status: None   Collection Time: 05/24/17  1:31 PM  Result Value Ref Range Status   MRSA by PCR NEGATIVE NEGATIVE Final    Comment:        The GeneXpert MRSA Assay (FDA approved for NASAL specimens only), is one component of a comprehensive MRSA colonization surveillance program. It is not intended to diagnose MRSA infection nor to guide or monitor treatment for MRSA infections. Performed at [facility], [place] [address] [place], [place] [address]     Time coordinating discharge: 35 minutes  SIGNED:  Chipper Oman, MD  [facility] 06/02/2017, 2:08 PM  Pager please text page via  [email or web address]  Note - This record has been created using [employer]. Chart creation errors have been sought, but may not always have been located. Such creation errors do not reflect on  the standard of medical care.

## 2017-06-02 NOTE — Progress Notes (Signed)
CM talked to patient about DCP; patient stated " I'm going to stay in the hospital until Monday, CM informed him that he has been discharged from the hospital as of today. Patient stated " I don't have any clothes, CM told that patient that I will notify the SW to get him some clothes. Patient stated "Well I guess I will leave today." CM asked him is he going to a nursing facility, he stated "yes and goodbye." CM called [place] SW and made her aware of the conversation. If patient refuse to go to SNF, second option would be a shelter. Mindi Slicker [facility] [phone]

## 2017-06-02 NOTE — Progress Notes (Signed)
Patient now in agreement to discharge to [facility]. Facility can accept patient in the morning. Patient's daughter called CSW back and stated she would help patient financially if he is worried about giving up his check.  Percell Locus Ori Kreiter LCSW [phone]

## 2017-06-03 NOTE — Progress Notes (Signed)
PT Cancellation Note  Patient Details Name: Lucas [name] MRN: [id number] DOB: [birth year]   Cancelled Treatment:    Reason Eval/Treat Not Completed: Other (comment)(Pt being discharged to SNF, did not want PT this am. )   Lucas [name] 06/03/2017, 10:40 AM  Lucas [name] Acute Rehabilitation [phone] (pager)

## 2017-06-03 NOTE — Plan of Care (Signed)
Progressing

## 2017-06-03 NOTE — Progress Notes (Signed)
Patient will DC to: [place] Anticipated DC date: 06/03/17  Family notified: Daughter Transport by: [facility] 10:45am   Per MD patient ready for DC to [facility]. RN, patient, patient's family, and facility notified of DC. Discharge Summary sent to facility. RN given number for report [phone]). DC packet on chart. Ambulance transport requested for patient.   CSW signing off.  Cedric Fishman, [place] Social Worker [phone]

## 2017-06-03 NOTE — Progress Notes (Signed)
[facility]   Patient was discharged yesterday, unable to leave to social issues. Patient is scheduled to leave to [facility] today. Patient remains medically stable for discharge. No acute events overnight.   See discharge summary for full details   Chipper Oman, MD

## 2017-06-03 NOTE — Progress Notes (Signed)
Called report to Warden/ranger at [facility].

## 2017-06-03 NOTE — Progress Notes (Signed)
Occupational Therapy Treatment Patient Details Name: Lucas [name] MRN: [id number] DOB: 1950/10/28 Today's Date: 06/03/2017    History of present illness Pt is a 67 y/o male admitted secondary to R sided weakness. CT head demonstrated new lacunar infarcts in left corona and superior basal ganglia along with chronic b/l basal ganglia infarcts. PMH including but not limited HTN, polysubstance abuse and prior CVA in 2012.   OT comments  Pt angry and cursing, removed empty bed pan from under himself and threw it in the chair. Assisted pt to [facility] at his request for BM. Educated in importance of protecting R UE and elevating to decrease edema. Pt not receptive.   Follow Up Recommendations  SNF;Supervision/Assistance - 24 hour    Equipment Recommendations       Recommendations for Other Services      Precautions / Restrictions Precautions Precautions: Fall Restrictions Weight Bearing Restrictions: No       Mobility Bed Mobility Overal bed mobility: Needs Assistance Bed Mobility: Supine to Sit;Sit to Supine     Supine to sit: Mod assist;HOB elevated Sit to supine: Mod assist   General bed mobility comments: cues for sequencing, assist for R LE and trunk  Transfers                      Balance Overall balance assessment: Needs assistance   Sitting balance-Leahy Scale: Poor       Standing balance-Leahy Scale: Zero                             ADL either performed or assessed with clinical judgement   ADL Overall ADL's : Needs assistance/impaired                         Toilet Transfer: Maximal assistance;Squat-pivot;BSC Toilet Transfer Details (indicate cue type and reason): bed<>BSC Toileting- Clothing Manipulation and Hygiene: Maximal assistance;Sitting/lateral [name]         General ADL Comments: pt declined any further activity, called SW for clothing, but did not arrive at time of session     Vision       Perception      Praxis      Cognition Arousal/Alertness: Awake/alert Behavior During Therapy: Flat affect Overall Cognitive Status: Impaired/Different from baseline Area of Impairment: Following commands;Safety/judgement;Problem solving                       Following Commands: Follows one step commands with increased time Safety/Judgement: Decreased awareness of safety;Decreased awareness of deficits   Problem Solving: Decreased initiation;Slow processing;Requires verbal cues;Requires tactile cues General Comments: pt angry and cursing        Exercises     Shoulder Instructions       General Comments      Pertinent Vitals/ Pain       Pain Assessment: No/denies pain  Home Living                                          Prior Functioning/Environment              Frequency  Min 2X/week        Progress Toward Goals  OT Goals(current goals can now be found in the care plan section)  Progress towards OT goals: Not progressing  toward goals - comment(pt resistant to therapy)  Acute Rehab OT Goals Patient Stated Goal: return to independence OT Goal Formulation: With patient Time For Goal Achievement: 06/11/17 Potential to Achieve Goals: [place] Discharge plan needs to be updated    Co-evaluation                 AM-PAC PT "6 Clicks" Daily Activity     Outcome Measure   Help from another person eating meals?: A Little Help from another person taking care of personal grooming?: A Little Help from another person toileting, which includes using toliet, bedpan, or urinal?: A Lot Help from another person bathing (including washing, rinsing, drying)?: A Lot Help from another person to put on and taking off regular upper body clothing?: A Lot Help from another person to put on and taking off regular lower body clothing?: A Lot 6 Click Score: 14    End of Session Equipment Utilized During Treatment: Gait belt  OT Visit Diagnosis:  Unsteadiness on feet (R26.81);Hemiplegia and hemiparesis Hemiplegia - Right/Left: Right Hemiplegia - dominant/non-dominant: Dominant Hemiplegia - caused by: Cerebral infarction   Activity Tolerance Treatment limited secondary to agitation   Patient Left in bed;with call bell/phone within reach;with bed alarm set   Nurse Communication (Sw re: need for clothes for d/c)        Time: 0347-4259 OT Time Calculation (min): 17 min  Charges: OT General Charges $OT Visit: 1 Visit OT Treatments $Self Care/Home Management : 8-22 mins  06/03/2017 Lucas [name], OTR/L Pager: [phone]   Lucas [name] Lucas [name] 06/03/2017, 12:48 PM

## 2017-06-03 NOTE — Clinical Social Work Placement (Signed)
   CLINICAL SOCIAL WORK PLACEMENT  NOTE  Date:  06/03/2017  Patient Details  Name: Lucas [name] MRN: [id number] Date of Birth: 02/07/1951  Clinical Social Work is seeking post-discharge placement for this patient at the [place] level of care (*CSW will initial, date and re-position this form in  chart as items are completed):  Yes   Patient/family provided with [place] Work Department's list of facilities offering this level of care within the geographic area requested by the patient (or if unable, by the patient's family).  Yes   Patient/family informed of their freedom to choose among providers that offer the needed level of care, that participate in Medicare, Medicaid or managed care program needed by the patient, have an available bed and are willing to accept the patient.  Yes   Patient/family informed of [place]'s ownership interest in [facility] and [facility], as well as of the fact that they are under no obligation to receive care at these facilities.  PASRR submitted to EDS on 06/03/17     PASRR number received on 06/03/17     Existing PASRR number confirmed on       FL2 transmitted to all facilities in geographic area requested by pt/family on 06/03/17     FL2 transmitted to all facilities within larger geographic area on       Patient informed that his/her managed care company has contracts with or will negotiate with certain facilities, including the following:        Yes   Patient/family informed of bed offers received.  Patient chooses bed at [facility]     Physician recommends and patient chooses bed at      Patient to be transferred to [facility] on 06/03/17.  Patient to be transferred to facility by [facility]     Patient family notified on 06/03/17 of transfer.  Name of family member notified:  Daughter     PHYSICIAN       Additional Comment:     _______________________________________________ Benard Halsted, [place] 06/03/2017, 8:35 AM

## 2017-06-03 NOTE — Progress Notes (Signed)
Patient discharge teaching given, including activity, diet, follow-up appoints, and medications. Patient verbalized understanding of all discharge instructions. IV access was d/c'd. Vitals are stable. Skin is intact except as charted in most recent assessments. Pt to be escorted out by [facility], to be driven to [facility].

## 2017-06-15 ENCOUNTER — Emergency Department (HOSPITAL_COMMUNITY): Payer: Medicaid Other

## 2017-06-15 ENCOUNTER — Other Ambulatory Visit: Payer: Self-pay

## 2017-06-15 ENCOUNTER — Inpatient Hospital Stay (HOSPITAL_COMMUNITY)
Admission: EM | Admit: 2017-06-15 | Discharge: 2017-06-17 | DRG: 682 | Disposition: A | Discharge status: Skilled Nursing Facility | Payer: Medicaid Other | Attending: Family Medicine | Admitting: Family Medicine

## 2017-06-15 ENCOUNTER — Encounter (HOSPITAL_COMMUNITY): Payer: Self-pay | Admitting: Emergency Medicine

## 2017-06-15 DIAGNOSIS — R778 Other specified abnormalities of plasma proteins: Secondary | ICD-10-CM

## 2017-06-15 DIAGNOSIS — Z7982 Long term (current) use of aspirin: Secondary | ICD-10-CM

## 2017-06-15 DIAGNOSIS — R7989 Other specified abnormal findings of blood chemistry: Secondary | ICD-10-CM

## 2017-06-15 DIAGNOSIS — I248 Other forms of acute ischemic heart disease: Secondary | ICD-10-CM | POA: Diagnosis present

## 2017-06-15 DIAGNOSIS — L89152 Pressure ulcer of sacral region, stage 2: Secondary | ICD-10-CM

## 2017-06-15 DIAGNOSIS — B9689 Other specified bacterial agents as the cause of diseases classified elsewhere: Secondary | ICD-10-CM | POA: Diagnosis present

## 2017-06-15 DIAGNOSIS — R402413 Glasgow coma scale score 13-15, at hospital admission: Secondary | ICD-10-CM | POA: Diagnosis present

## 2017-06-15 DIAGNOSIS — N390 Urinary tract infection, site not specified: Secondary | ICD-10-CM | POA: Diagnosis not present

## 2017-06-15 DIAGNOSIS — R748 Abnormal levels of other serum enzymes: Secondary | ICD-10-CM | POA: Diagnosis not present

## 2017-06-15 DIAGNOSIS — N184 Chronic kidney disease, stage 4 (severe): Secondary | ICD-10-CM | POA: Diagnosis present

## 2017-06-15 DIAGNOSIS — R29705 NIHSS score 5: Secondary | ICD-10-CM | POA: Diagnosis present

## 2017-06-15 DIAGNOSIS — Z79899 Other long term (current) drug therapy: Secondary | ICD-10-CM

## 2017-06-15 DIAGNOSIS — I130 Hypertensive heart and chronic kidney disease with heart failure and stage 1 through stage 4 chronic kidney disease, or unspecified chronic kidney disease: Secondary | ICD-10-CM | POA: Diagnosis present

## 2017-06-15 DIAGNOSIS — R001 Bradycardia, unspecified: Secondary | ICD-10-CM | POA: Diagnosis present

## 2017-06-15 DIAGNOSIS — L8990 Pressure ulcer of unspecified site, unspecified stage: Secondary | ICD-10-CM | POA: Diagnosis present

## 2017-06-15 DIAGNOSIS — G9341 Metabolic encephalopathy: Secondary | ICD-10-CM | POA: Diagnosis present

## 2017-06-15 DIAGNOSIS — Z7902 Long term (current) use of antithrombotics/antiplatelets: Secondary | ICD-10-CM

## 2017-06-15 DIAGNOSIS — F17210 Nicotine dependence, cigarettes, uncomplicated: Secondary | ICD-10-CM | POA: Diagnosis present

## 2017-06-15 DIAGNOSIS — I69351 Hemiplegia and hemiparesis following cerebral infarction affecting right dominant side: Secondary | ICD-10-CM

## 2017-06-15 DIAGNOSIS — I5032 Chronic diastolic (congestive) heart failure: Secondary | ICD-10-CM | POA: Diagnosis present

## 2017-06-15 DIAGNOSIS — E860 Dehydration: Secondary | ICD-10-CM | POA: Diagnosis present

## 2017-06-15 DIAGNOSIS — Z8659 Personal history of other mental and behavioral disorders: Secondary | ICD-10-CM

## 2017-06-15 DIAGNOSIS — N189 Chronic kidney disease, unspecified: Secondary | ICD-10-CM

## 2017-06-15 DIAGNOSIS — B9620 Unspecified Escherichia coli [E. coli] as the cause of diseases classified elsewhere: Secondary | ICD-10-CM | POA: Diagnosis present

## 2017-06-15 DIAGNOSIS — E785 Hyperlipidemia, unspecified: Secondary | ICD-10-CM | POA: Diagnosis present

## 2017-06-15 DIAGNOSIS — N179 Acute kidney failure, unspecified: Secondary | ICD-10-CM | POA: Diagnosis not present

## 2017-06-15 DIAGNOSIS — R4182 Altered mental status, unspecified: Secondary | ICD-10-CM | POA: Diagnosis present

## 2017-06-15 DIAGNOSIS — I10 Essential (primary) hypertension: Secondary | ICD-10-CM | POA: Diagnosis not present

## 2017-06-15 DIAGNOSIS — R7881 Bacteremia: Secondary | ICD-10-CM | POA: Diagnosis present

## 2017-06-15 DIAGNOSIS — Z8673 Personal history of transient ischemic attack (TIA), and cerebral infarction without residual deficits: Secondary | ICD-10-CM

## 2017-06-15 DIAGNOSIS — Z91018 Allergy to other foods: Secondary | ICD-10-CM

## 2017-06-15 DIAGNOSIS — D631 Anemia in chronic kidney disease: Secondary | ICD-10-CM | POA: Diagnosis present

## 2017-06-15 HISTORY — DX: Occlusion and stenosis of unspecified carotid artery: I65.29

## 2017-06-15 HISTORY — DX: Chronic kidney disease, stage 4 (severe): N18.4

## 2017-06-15 HISTORY — DX: Chronic diastolic (congestive) heart failure: I50.32

## 2017-06-15 HISTORY — DX: Hyperlipidemia, unspecified: E78.5

## 2017-06-15 LAB — I-STAT CHEM 8, ED
BUN: 65 mg/dL — AB (ref 6–20)
CALCIUM ION: 1.09 mmol/L — AB (ref 1.15–1.40)
CHLORIDE: 104 mmol/L (ref 101–111)
Creatinine, Ser: 4.7 mg/dL — ABNORMAL HIGH (ref 0.61–1.24)
Glucose, Bld: 153 mg/dL — ABNORMAL HIGH (ref 65–99)
HCT: 33 % — ABNORMAL LOW (ref 39.0–52.0)
Hemoglobin: 11.2 g/dL — ABNORMAL LOW (ref 13.0–17.0)
Potassium: 4.3 mmol/L (ref 3.5–5.1)
Sodium: 138 mmol/L (ref 135–145)
TCO2: 20 mmol/L — ABNORMAL LOW (ref 22–32)

## 2017-06-15 LAB — CBC WITH DIFFERENTIAL/PLATELET
Basophils Absolute: 0 10*3/uL (ref 0.0–0.1)
Basophils Relative: 0 %
EOS ABS: 0 10*3/uL (ref 0.0–0.7)
EOS PCT: 0 %
HCT: 31.9 % — ABNORMAL LOW (ref 39.0–52.0)
Hemoglobin: 10.5 g/dL — ABNORMAL LOW (ref 13.0–17.0)
LYMPHS ABS: 0.7 10*3/uL (ref 0.7–4.0)
Lymphocytes Relative: 4 %
MCH: 25.9 pg — AB (ref 26.0–34.0)
MCHC: 32.9 g/dL (ref 30.0–36.0)
MCV: 78.6 fL (ref 78.0–100.0)
MONOS PCT: 6 %
Monocytes Absolute: 1.1 10*3/uL — ABNORMAL HIGH (ref 0.1–1.0)
Neutro Abs: 16.8 10*3/uL — ABNORMAL HIGH (ref 1.7–7.7)
Neutrophils Relative %: 90 %
PLATELETS: 317 10*3/uL (ref 150–400)
RBC: 4.06 MIL/uL — ABNORMAL LOW (ref 4.22–5.81)
RDW: 15.3 % (ref 11.5–15.5)
WBC: 18.7 10*3/uL — ABNORMAL HIGH (ref 4.0–10.5)

## 2017-06-15 LAB — COMPREHENSIVE METABOLIC PANEL
ALT: 12 U/L — ABNORMAL LOW (ref 17–63)
ANION GAP: 19 — AB (ref 5–15)
AST: 17 U/L (ref 15–41)
Albumin: 3 g/dL — ABNORMAL LOW (ref 3.5–5.0)
Alkaline Phosphatase: 72 U/L (ref 38–126)
BUN: 79 mg/dL — ABNORMAL HIGH (ref 6–20)
CHLORIDE: 100 mmol/L — AB (ref 101–111)
CO2: 18 mmol/L — AB (ref 22–32)
Calcium: 8.9 mg/dL (ref 8.9–10.3)
Creatinine, Ser: 4.49 mg/dL — ABNORMAL HIGH (ref 0.61–1.24)
GFR calc non Af Amer: 13 mL/min — ABNORMAL LOW (ref >60)
GFR, EST AFRICAN AMERICAN: 15 mL/min — AB (ref >60)
Glucose, Bld: 154 mg/dL — ABNORMAL HIGH (ref 65–99)
Potassium: 4.2 mmol/L (ref 3.5–5.1)
SODIUM: 137 mmol/L (ref 135–145)
Total Bilirubin: 0.6 mg/dL (ref 0.3–1.2)
Total Protein: 7.8 g/dL (ref 6.5–8.1)

## 2017-06-15 LAB — URINALYSIS, ROUTINE W REFLEX MICROSCOPIC
Bilirubin Urine: NEGATIVE
Glucose, UA: NEGATIVE mg/dL
KETONES UR: NEGATIVE mg/dL
NITRITE: NEGATIVE
PH: 5 (ref 5.0–8.0)
Protein, ur: 100 mg/dL — AB
SPECIFIC GRAVITY, URINE: 1.012 (ref 1.005–1.030)
Squamous Epithelial / LPF: NONE SEEN

## 2017-06-15 LAB — I-STAT CG4 LACTIC ACID, ED: Lactic Acid, Venous: 0.83 mmol/L (ref 0.5–1.9)

## 2017-06-15 LAB — RAPID URINE DRUG SCREEN, HOSP PERFORMED
AMPHETAMINES: NOT DETECTED
BARBITURATES: NOT DETECTED
Benzodiazepines: NOT DETECTED
COCAINE: NOT DETECTED
Opiates: NOT DETECTED
TETRAHYDROCANNABINOL: NOT DETECTED

## 2017-06-15 LAB — TROPONIN I: TROPONIN I: 0.24 ng/mL — AB (ref <0.03)

## 2017-06-15 LAB — BRAIN NATRIURETIC PEPTIDE: B NATRIURETIC PEPTIDE 5: 166 pg/mL — AB (ref 0.0–100.0)

## 2017-06-15 LAB — I-STAT TROPONIN, ED: Troponin i, poc: 0.19 ng/mL (ref 0.00–0.08)

## 2017-06-15 LAB — SURGICAL PCR SCREEN
MRSA, PCR: NEGATIVE
Staphylococcus aureus: NEGATIVE

## 2017-06-15 LAB — INFLUENZA PANEL BY PCR (TYPE A & B)
Influenza A By PCR: NEGATIVE
Influenza B By PCR: NEGATIVE

## 2017-06-15 MED ORDER — SODIUM CHLORIDE 0.9 % IV SOLN
INTRAVENOUS | Status: DC
  Administered 2017-06-15 – 2017-06-17 (×5): via INTRAVENOUS

## 2017-06-15 MED ORDER — ATORVASTATIN CALCIUM 40 MG PO TABS
40.0000 mg | ORAL_TABLET | Freq: Every day | ORAL | Status: DC
  Administered 2017-06-15 – 2017-06-16 (×2): 40 mg via ORAL
  Filled 2017-06-15 (×2): qty 1

## 2017-06-15 MED ORDER — CLONIDINE HCL 0.2 MG PO TABS
0.2000 mg | ORAL_TABLET | Freq: Two times a day (BID) | ORAL | Status: DC
  Administered 2017-06-15 – 2017-06-17 (×4): 0.2 mg via ORAL
  Filled 2017-06-15 (×4): qty 1

## 2017-06-15 MED ORDER — AMLODIPINE BESYLATE 5 MG PO TABS
10.0000 mg | ORAL_TABLET | Freq: Every day | ORAL | Status: DC
  Administered 2017-06-16 – 2017-06-17 (×2): 10 mg via ORAL
  Filled 2017-06-15 (×2): qty 2

## 2017-06-15 MED ORDER — CLOPIDOGREL BISULFATE 75 MG PO TABS
75.0000 mg | ORAL_TABLET | Freq: Every day | ORAL | Status: DC
  Administered 2017-06-16 – 2017-06-17 (×2): 75 mg via ORAL
  Filled 2017-06-15 (×2): qty 1

## 2017-06-15 MED ORDER — SODIUM CHLORIDE 0.9 % IV SOLN
INTRAVENOUS | Status: DC
  Administered 2017-06-15: 100 mL/h via INTRAVENOUS

## 2017-06-15 MED ORDER — ENOXAPARIN SODIUM 30 MG/0.3ML ~~LOC~~ SOLN
30.0000 mg | SUBCUTANEOUS | Status: DC
  Administered 2017-06-15 – 2017-06-16 (×2): 30 mg via SUBCUTANEOUS
  Filled 2017-06-15 (×2): qty 0.3

## 2017-06-15 MED ORDER — FOLIC ACID 1 MG PO TABS
1.0000 mg | ORAL_TABLET | Freq: Every day | ORAL | Status: DC
  Administered 2017-06-16 – 2017-06-17 (×2): 1 mg via ORAL
  Filled 2017-06-15 (×2): qty 1

## 2017-06-15 MED ORDER — ONDANSETRON HCL 4 MG/2ML IJ SOLN: 4.0000 mg | Freq: Four times a day (QID) | INTRAMUSCULAR | Status: DC | PRN

## 2017-06-15 MED ORDER — ASPIRIN EC 325 MG PO TBEC
325.0000 mg | DELAYED_RELEASE_TABLET | Freq: Every day | ORAL | Status: DC
  Administered 2017-06-16 – 2017-06-17 (×2): 325 mg via ORAL
  Filled 2017-06-15 (×2): qty 1

## 2017-06-15 MED ORDER — SODIUM CHLORIDE 0.9 % IV SOLN
1.0000 g | INTRAVENOUS | Status: DC
  Administered 2017-06-15: 1 g via INTRAVENOUS
  Filled 2017-06-15: qty 1

## 2017-06-15 MED ORDER — ONDANSETRON HCL 4 MG PO TABS: 4.0000 mg | ORAL_TABLET | Freq: Four times a day (QID) | ORAL | Status: DC | PRN

## 2017-06-15 MED ORDER — ACETAMINOPHEN 650 MG RE SUPP
650.0000 mg | Freq: Four times a day (QID) | RECTAL | Status: DC | PRN
Start: 2017-06-15 — End: 2017-06-17

## 2017-06-15 MED ORDER — ACETAMINOPHEN 325 MG PO TABS: 650.0000 mg | ORAL_TABLET | Freq: Four times a day (QID) | ORAL | Status: DC | PRN

## 2017-06-15 NOTE — H&P (Signed)
History and Physical    Lucas [name]:[id number] DOB: 01/29/1951 DOA: 06/15/2017  PCP: Patient, No Pcp Per  Patient coming from: SNF  I have personally briefly reviewed patient's old medical records in [place]  Chief Complaint: Fever and lethargy  HPI: Lucas [name] is a 67 y.o. 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  He also has a history of chronic kidney disease stage IV, hypertension, bradycardia with a heart rate in the 40s, hyperlipidemia.  Patient was discharged to skilled nursing facility after his last admission.  He was brought back to the emergency room today with "low-grade fever", generalized weakness and lethargy.  At the time of my evaluation, patient does not appear to be confused.  He knows he is in the hospital, but is unsure why he was brought here.  Denies any vomiting, diarrhea, significant cough or shortness of breath.  He has not had any chest pain.  ED Course: Vitals were noted to be stable in the emergency room.  He was not febrile.  He has had a rising creatinine to 4.49.  This was previously 3.2 on last discharge.  WBC count elevated at 18.7.  Hemoglobin of 10.5.  Chest x-ray did not show any obvious pneumonia.  Urinalysis indicates too numerous to count WBCs.  Urine drug screen is negative.  CT head is unremarkable.  Point-of-care troponin mildly elevated at 0.19.  Patient has been referred for admission.  Review of Systems: As per HPI otherwise 10 point review of systems negative.   Past Medical History:  Diagnosis Date  . Carotid artery stenosis   . Chronic diastolic heart failure ([place])   . Chronic kidney disease, stage IV (severe) ([place])   . CVA (cerebral infarction) 11/2010   CT 8/12 : Sm ischemic infarct posterior limb of L internal capsule. Carotids nl. ECHO nl with EF 55-60%. R sided weakness.   . Hyperlipemia   . Hyperlipidemia 01/06/2011   Diagnosed during acute CVA  hospitalization. On statin.  Marland Kitchen Hypertension 11/2010   Diagnosed during acute CVA hospitalization  . Polysubstance abuse ([place]) 01/06/2011   cocaine, tobacco, THC    History reviewed. No pertinent surgical history.   reports that he has been smoking cigarettes.  He started smoking about 52 years ago. He has a 8.00 pack-year smoking history. he has never used smokeless tobacco. He reports that he drinks about 1.2 oz of alcohol per week. He reports that he does not use drugs.  Allergies  Allergen Reactions  . Pork-Derived Products Other (See Comments)    Patient does not eat pork products.    Family History  Problem Relation Age of Onset  . Hypertension Mother   . Hypertension Father     Prior to Admission medications   Medication Sig Start Date End Date Taking? Authorizing Provider  amLODipine (NORVASC) 10 MG tablet Take 1 tablet (10 mg total) by mouth daily. 06/12/14  Yes Funches, Adriana Mccallum, MD  aspirin EC 325 MG EC tablet Take 1 tablet (325 mg total) by mouth daily. 06/03/17  Yes Patrecia Pour, Christean Grief, MD  atorvastatin (LIPITOR) 40 MG tablet Take 1 tablet (40 mg total) by mouth daily at 6 PM. 06/02/17  Yes Patrecia Pour, Christean Grief, MD  cloNIDine (CATAPRES) 0.2 MG tablet Take 1 tablet (0.2 mg total) by mouth 2 (two) times daily. 06/02/17  Yes Patrecia Pour, Christean Grief, MD  clopidogrel (PLAVIX) 75 MG tablet Take 1 tablet (75 mg total) by mouth  daily. 06/03/17  Yes Patrecia Pour, Christean Grief, MD  folic acid (FOLVITE) 1 MG tablet Take 1 tablet (1 mg total) by mouth daily. 06/03/17  Yes Patrecia Pour, Christean Grief, MD  hydrochlorothiazide (HYDRODIURIL) 25 MG tablet Take 1 tablet (25 mg total) by mouth daily. 06/03/17  Yes Doreatha Lew, MD    Physical Exam: Vitals:   06/15/17 1200 06/15/17 1230 06/15/17 1300 06/15/17 1402  BP: (!) 130/55 (!) 152/59 (!) 117/57 (!) 169/59  Pulse: (!) 48 (!) 45 (!) 44 (!) 42  Resp: (!) 22 17 18 18   Temp:    98.2 F (36.8 C)  TempSrc:    Oral  SpO2: 100% 99% 100% 100%  Weight:     51.5 kg (113 lb 8.6 oz)  Height:    5\' 5"  (1.651 m)    Constitutional: NAD, calm, comfortable Vitals:   06/15/17 1200 06/15/17 1230 06/15/17 1300 06/15/17 1402  BP: (!) 130/55 (!) 152/59 (!) 117/57 (!) 169/59  Pulse: (!) 48 (!) 45 (!) 44 (!) 42  Resp: (!) 22 17 18 18   Temp:    98.2 F (36.8 C)  TempSrc:    Oral  SpO2: 100% 99% 100% 100%  Weight:    51.5 kg (113 lb 8.6 oz)  Height:    5\' 5"  (1.651 m)   Eyes: PERRL, lids and conjunctivae normal ENMT: Mucous membranes are dry. Posterior pharynx clear of any exudate or lesions.Normal dentition.  Neck: normal, supple, no masses, no thyromegaly Respiratory: clear to auscultation bilaterally, no wheezing, no crackles. Normal respiratory effort. No accessory muscle use.  Cardiovascular: Regular rate and rhythm, no murmurs / rubs / gallops. No extremity edema. 2+ pedal pulses. No carotid bruits.  Abdomen: no tenderness, no masses palpated. No hepatosplenomegaly. Bowel sounds positive.  Musculoskeletal: no clubbing / cyanosis. No joint deformity upper and lower extremities.  Skin: stage 2 sacral pressure injury Neurologic: CN 2-12 grossly intact. Dense right sided hemiparesis Psychiatric: Normal judgment and insight. Alert and oriented x 3. Normal mood.   Labs on Admission: I have personally reviewed following labs and imaging studies  CBC: Recent Labs  Lab 06/15/17 0955 06/15/17 1009  WBC 18.7*  --   NEUTROABS 16.8*  --   HGB 10.5* 11.2*  HCT 31.9* 33.0*  MCV 78.6  --   PLT 317  --    Basic Metabolic Panel: Recent Labs  Lab 06/15/17 0955 06/15/17 1009  NA 137 138  K 4.2 4.3  CL 100* 104  CO2 18*  --   GLUCOSE 154* 153*  BUN 79* 65*  CREATININE 4.49* 4.70*  CALCIUM 8.9  --    GFR: Estimated Creatinine Clearance: 11.6 mL/min (A) (by C-G formula based on SCr of 4.7 mg/dL (H)). Liver Function Tests: Recent Labs  Lab 06/15/17 0955  AST 17  ALT 12*  ALKPHOS 72  BILITOT 0.6  PROT 7.8  ALBUMIN 3.0*   No results for  input(s): LIPASE, AMYLASE in the last 168 hours. No results for input(s): AMMONIA in the last 168 hours. Coagulation Profile: No results for input(s): INR, PROTIME in the last 168 hours. Cardiac Enzymes: No results for input(s): CKTOTAL, CKMB, CKMBINDEX, TROPONINI in the last 168 hours. BNP (last 3 results) No results for input(s): PROBNP in the last 8760 hours. HbA1C: No results for input(s): HGBA1C in the last 72 hours. CBG: No results for input(s): GLUCAP in the last 168 hours. Lipid Profile: No results for input(s): CHOL, HDL, LDLCALC, TRIG, CHOLHDL, LDLDIRECT in the last 72 hours.  Thyroid Function Tests: No results for input(s): TSH, T4TOTAL, FREET4, T3FREE, THYROIDAB in the last 72 hours. Anemia Panel: No results for input(s): VITAMINB12, FOLATE, FERRITIN, TIBC, IRON, RETICCTPCT in the last 72 hours. Urine analysis:    Component Value Date/Time   COLORURINE YELLOW 06/15/2017 0955   APPEARANCEUR CLOUDY (A) 06/15/2017 0955   LABSPEC 1.012 06/15/2017 0955   PHURINE 5.0 06/15/2017 0955   GLUCOSEU NEGATIVE 06/15/2017 0955   HGBUR MODERATE (A) 06/15/2017 0955   BILIRUBINUR NEGATIVE 06/15/2017 0955   KETONESUR NEGATIVE 06/15/2017 0955   PROTEINUR 100 (A) 06/15/2017 0955   UROBILINOGEN 0.2 12/09/2010 1117   NITRITE NEGATIVE 06/15/2017 0955   LEUKOCYTESUR LARGE (A) 06/15/2017 0955    Radiological Exams on Admission: Dg Chest 2 View  Result Date: 06/15/2017 CLINICAL DATA:  Worsening lethargy and weakness.  Low-grade fever. EXAM: CHEST  2 VIEW COMPARISON:  05/26/2017 FINDINGS: Lordotic positioning. Chronic cardiomegaly and aortic atherosclerosis. Lungs appear clear allowing for positioning. There may be central bronchial thickening but there is no consolidation or collapse. No sign of heart failure. IMPRESSION: Chronic cardiomegaly and aortic atherosclerosis. Possible bronchitis but no consolidation or collapse. Electronically Signed   By: Nelson Chimes M.D.   On: 06/15/2017 10:23    Ct Head Wo Contrast  Result Date: 06/15/2017 CLINICAL DATA:  Lethargy, generalized weakness, bradycardia, low-grade fever, history of stroke, hypertension, chronic diastolic heart failure, smoker EXAM: CT HEAD WITHOUT CONTRAST TECHNIQUE: Contiguous axial images were obtained from the base of the skull through the vertex without intravenous contrast. Sagittal and coronal MPR images reconstructed from axial data set. COMPARISON:  05/24/2017 FINDINGS: Brain: Generalized atrophy. Normal ventricular morphology. No midline shift or mass effect. Small vessel chronic ischemic changes of deep cerebral white matter. Small old LEFT cerebellar infarct. Old BILATERAL basal ganglia and question tiny thalamic lacunar infarcts. No intracranial hemorrhage, mass lesion, evidence of acute infarction, or extra-axial fluid collection. Vascular: Atherosclerotic calcifications of internal carotid arteries bilaterally at skull base Skull: Intact Sinuses/Orbits: Clear Other: N/A IMPRESSION: Atrophy with small vessel chronic ischemic changes of deep cerebral white matter. Multiple old old infarcts as above. No acute intracranial abnormalities. Electronically Signed   By: Lavonia Dana M.D.   On: 06/15/2017 10:36    EKG: Independently reviewed. LVH, no acute changes  Assessment/Plan Active Problems:   HTN (hypertension)   Hyperlipidemia with target LDL less than 100   Altered mental status   Pressure injury of skin   AKI (acute kidney injury) ([place])   CKD (chronic kidney disease) stage 4, GFR 15-29 ml/min (HCC)   History of stroke   Acute lower UTI   Dehydration    1. Altered mental status.  Patient was noted to be lethargic and confused.  This may be related to volume depletion with possible UTI.  He has been started on hydration in the emergency room.  At this time, he is awake and appears to be conversing appropriately.  We will continue to monitor. 2. Acute kidney injury on chronic kidney disease stage IV.  Continue  gentle hydration since he does appear to be dehydrated.  Repeat labs in a.m. 3. Possible urinary tract infection.  Patient was noted to be mildly febrile at the nursing facility.  He has an elevated WBC count.  Will start empirically on Rocephin for now.  Urine culture was sent in the emergency room. 4. Mildly elevated troponin in the setting of renal dysfunction.  Unclear significance.  No acute changes on EKG and is not having any chest  pain.  We will continue to cycle troponins.  Suspect demand ischemia. 5. Recent stroke.  Continue antiplatelet agents with aspirin and Plavix 6. Hyperlipidemia.  Continue statin 7. Hypertension.  Continue clonidine, amlodipine.  Hold hydrochlorothiazide the setting of elevated creatinine.  He is noted to be bradycardic in the 40s.  Review of prior discharge indicates that his heart rate was also in the 40s at that time.  We will continue current treatments.  If he needs further blood pressure control, will consider adding hydralazine. 8. Stage II pressure injury on sacrum.  Present on admission.  Continues supportive measures.  DVT prophylaxis: lovenox Code Status: full code Family Communication: no family present Disposition Plan: discharge back to SNF when clinically improved Consults called:  Admission status: observation, telemetry   Kathie Dike MD [facility] Pager [phone]  If 7PM-7AM, please contact night-coverage [email or web address] Password [facility]  06/15/2017, 4:22 PM

## 2017-06-15 NOTE — ED Triage Notes (Signed)
PT was sent to ED today by [facility] from [facility] for c/o lethargy, generalized weakness, bradycardia, low grade fever that started yesterday evening.

## 2017-06-15 NOTE — Progress Notes (Signed)
CRITICAL VALUE ALERT  Critical Value:  Troponin 0.24  Date & Time Notied:  06/15/17 @ 3361  Provider Notified: Roderic Palau, MD  Orders Received/Actions taken: Pt asymptomatic, continue to monitor. Q6 lab draws.

## 2017-06-15 NOTE — ED Provider Notes (Signed)
[facility] EMERGENCY DEPARTMENT Provider Note   CSN: [id number] Arrival date & time: 06/15/17  0944     History   Chief Complaint Chief Complaint  Patient presents with  . Bradycardia    HPI Lucas [name] is a 67 y.o. male.  The history is provided by the nursing home, the EMS personnel and the patient. The history is limited by the condition of the patient (AMS).  Pt was seen at 0955. Per EMS and NH report: Pt with increased lethargy, generalized weakness, and confusion since yesterday. NH reports "low grade fever" and "low HR" that started yesterday evening. Pt himself does not know why he is in the ED and states he "feels OK." Endorses "coughing" for the past few days. Denies CP/SOB, no abd pain, no N/V/D.   Past Medical History:  Diagnosis Date  . Carotid artery stenosis   . Chronic diastolic heart failure ([place])   . Chronic kidney disease, stage IV (severe) ([place])   . CVA (cerebral infarction) 11/2010   CT 8/12 : Sm ischemic infarct posterior limb of L internal capsule. Carotids nl. ECHO nl with EF 55-60%. R sided weakness.   . Hyperlipemia   . Hyperlipidemia 01/06/2011   Diagnosed during acute CVA hospitalization. On statin.  Marland Kitchen Hypertension 11/2010   Diagnosed during acute CVA hospitalization  . Polysubstance abuse ([place]) 01/06/2011   cocaine, tobacco, THC    Patient Active Problem List   Diagnosis Date Noted  . Altered mental status 06/15/2017  . Cocaine abuse ([place])   . Noncompliance w/medication treatment due to intermit use of medication   . Middle cerebral artery stenosis, right   . Stenosis of right carotid artery   . Cerebrovascular accident (CVA) ([place])   . 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 Smoker 06/12/2014  . Memory loss or impairment 02/13/2013  . Right shoulder pain 02/21/2011  . Hypertension 01/06/2011  . Hyperlipidemia with target LDL less than 100 01/06/2011  . Polysubstance abuse ([place]) 01/06/2011  . CVA (cerebral infarction) 01/06/2011  .  Preventative health care 01/06/2011    History reviewed. No pertinent surgical history.     Home Medications    Prior to Admission medications   Medication Sig Start Date End Date Taking? Authorizing Provider  amLODipine (NORVASC) 10 MG tablet Take 1 tablet (10 mg total) by mouth daily. Patient not taking: Reported on 04/24/2015 06/12/14   Boykin Nearing, MD  aspirin EC 325 MG EC tablet Take 1 tablet (325 mg total) by mouth daily. 06/03/17   Doreatha Lew, MD  atorvastatin (LIPITOR) 40 MG tablet Take 1 tablet (40 mg total) by mouth daily at 6 PM. 06/02/17   Patrecia Pour, Christean Grief, MD  cloNIDine (CATAPRES) 0.2 MG tablet Take 1 tablet (0.2 mg total) by mouth 2 (two) times daily. 06/02/17   Doreatha Lew, MD  clopidogrel (PLAVIX) 75 MG tablet Take 1 tablet (75 mg total) by mouth daily. 06/03/17   Doreatha Lew, MD  folic acid (FOLVITE) 1 MG tablet Take 1 tablet (1 mg total) by mouth daily. 06/03/17   Doreatha Lew, MD  hydrochlorothiazide (HYDRODIURIL) 25 MG tablet Take 1 tablet (25 mg total) by mouth daily. 06/03/17   Doreatha Lew, MD    Family History Family History  Problem Relation Age of Onset  . Hypertension Mother   . Hypertension Father     Social History Social History   Tobacco Use  . Smoking status: Current Some Day Smoker    Packs/day: 0.20  Years: 40.00    Pack years: 8.00    Types: Cigarettes    Start date: 01/02/1965  . Smokeless tobacco: Never Used  . Tobacco comment: 2-3 per week  Substance Use Topics  . Alcohol use: Yes    Alcohol/week: 1.2 oz    Types: 2 Standard drinks or equivalent per week    Comment: beer  . Drug use: No     Allergies   Pork-derived products   Review of Systems Review of Systems  Unable to perform ROS: Mental status change     Physical Exam Updated Vital Signs BP (!) 131/57   Pulse (!) 46   Temp 99.5 F (37.5 C) (Rectal)   Resp 18   Ht 5\' 5"  (1.651 m)   Wt 58.1 kg (128 lb)   SpO2 99%   BMI  21.30 kg/m   Physical Exam 1000: Physical examination:  Nursing notes reviewed; Vital signs and O2 SAT reviewed;  Constitutional: Well developed, Well nourished, In no acute distress; Head:  Normocephalic, atraumatic; Eyes: EOMI, PERRL, No scleral icterus; ENMT: Mouth and pharynx normal, Mucous membranes dry; Neck: Supple, Full range of motion, No lymphadenopathy; Cardiovascular: Bradycardic rate and rhythm, No gallop; Respiratory: Breath sounds clear & equal bilaterally, No wheezes.  Speaking full sentences with ease, Normal respiratory effort/excursion; Chest: Nontender, Movement normal; Abdomen: Soft, Nontender, Nondistended, Normal bowel sounds; Genitourinary: No CVA tenderness; Extremities: Pulses normal, No tenderness, No edema, No calf edema or asymmetry.; Neuro: Awake, alert, mildly confused re: events.  No facial droop. Speech clear. Right sided weakness per hx previous CVA, otherwise no gross focal motor deficits in extremities.; Skin: Color normal, Warm, Dry.   ED Treatments / Results  Labs (all labs ordered are listed, but only abnormal results are displayed)   EKG  EKG Interpretation  Date/Time:  Tuesday June 15 2017 09:47:00 EST Ventricular Rate:  46 PR Interval:    QRS Duration: 107 QT Interval:  481 QTC Calculation: 421 R Axis:   55 Text Interpretation:  Sinus bradycardia Left ventricular hypertrophy Anterior ST elevation, probably due to LVH Nonspecific ST abnormality Inferior leads When compared with ECG of 05/24/2017 and 04/17/2011 Rate slower Nonspecific ST abnormality Inferior leads is now Present Reconfirmed by Francine Graven [phone]) on 06/15/2017 10:45:40 AM       Radiology   Procedures Procedures (including critical care time)  Medications Ordered in ED Medications  0.9 %  sodium chloride infusion (100 mL/hr Intravenous New Bag/Given 06/15/17 1144)     Initial Impression / Assessment and Plan / ED Course  I have reviewed the triage vital signs and the  nursing notes.  Pertinent labs & imaging results that were available during my care of the patient were reviewed by me and considered in my medical decision making (see chart for details).  MDM Reviewed: previous chart, nursing note and vitals Reviewed previous: labs and ECG Interpretation: labs, ECG, x-ray and CT scan   Results for orders placed or performed during the hospital encounter of 06/15/17  Blood culture (routine x 2)  Result Value Ref Range   Specimen Description BLOOD RIGHT HAND DRAWN BY RN    Special Requests      BOTTLES DRAWN AEROBIC AND ANAEROBIC Blood Culture results may not be optimal due to an inadequate volume of blood received in culture bottles Performed at [facility], [address], [place], [place] [address]    Culture PENDING    Report Status PENDING   CBC with Differential  Result Value Ref  Range   WBC 18.7 (H) 4.0 - 10.5 K/uL   RBC 4.06 (L) 4.22 - 5.81 MIL/uL   Hemoglobin 10.5 (L) 13.0 - 17.0 g/dL   HCT 31.9 (L) 39.0 - 52.0 %   MCV 78.6 78.0 - 100.0 fL   MCH 25.9 (L) 26.0 - 34.0 pg   MCHC 32.9 30.0 - 36.0 g/dL   RDW 15.3 11.5 - 15.5 %   Platelets 317 150 - 400 K/uL   Neutrophils Relative % 90 %   Neutro Abs 16.8 (H) 1.7 - 7.7 K/uL   Lymphocytes Relative 4 %   Lymphs Abs 0.7 0.7 - 4.0 K/uL   Monocytes Relative 6 %   Monocytes Absolute 1.1 (H) 0.1 - 1.0 K/uL   Eosinophils Relative 0 %   Eosinophils Absolute 0.0 0.0 - 0.7 K/uL   Basophils Relative 0 %   Basophils Absolute 0.0 0.0 - 0.1 K/uL  Comprehensive metabolic panel  Result Value Ref Range   Sodium 137 135 - 145 mmol/L   Potassium 4.2 3.5 - 5.1 mmol/L   Chloride 100 (L) 101 - 111 mmol/L   CO2 18 (L) 22 - 32 mmol/L   Glucose, Bld 154 (H) 65 - 99 mg/dL   BUN 79 (H) 6 - 20 mg/dL   Creatinine, Ser 4.49 (H) 0.61 - 1.24 mg/dL   Calcium 8.9 8.9 - 10.3 mg/dL   Total Protein 7.8 6.5 - 8.1 g/dL   Albumin 3.0 (L) 3.5 - 5.0 g/dL   AST 17 15 - 41 U/L   ALT 12 (L) 17 - 63 U/L   Alkaline  Phosphatase 72 38 - 126 U/L   Total Bilirubin 0.6 0.3 - 1.2 mg/dL   GFR calc non Af Amer 13 (L) >60 mL/min   GFR calc Af Amer 15 (L) >60 mL/min   Anion gap 19 (H) 5 - 15  Brain natriuretic peptide  Result Value Ref Range   B Natriuretic Peptide 166.0 (H) 0.0 - 100.0 pg/mL  Urinalysis, Routine w reflex microscopic  Result Value Ref Range   Color, Urine YELLOW YELLOW   APPearance CLOUDY (A) CLEAR   Specific Gravity, Urine 1.012 1.005 - 1.030   pH 5.0 5.0 - 8.0   Glucose, UA NEGATIVE NEGATIVE mg/dL   Hgb urine dipstick MODERATE (A) NEGATIVE   Bilirubin Urine NEGATIVE NEGATIVE   Ketones, ur NEGATIVE NEGATIVE mg/dL   Protein, ur 100 (A) NEGATIVE mg/dL   Nitrite NEGATIVE NEGATIVE   Leukocytes, UA LARGE (A) NEGATIVE   RBC / HPF TOO NUMEROUS TO COUNT 0 - 5 RBC/hpf   WBC, UA TOO NUMEROUS TO COUNT 0 - 5 WBC/hpf   Bacteria, UA RARE (A) NONE SEEN   Squamous Epithelial / LPF NONE SEEN NONE SEEN   WBC Clumps PRESENT    Budding Yeast PRESENT    Uric Acid Crys, UA PRESENT   Rapid urine drug screen (hospital performed)  Result Value Ref Range   Opiates NONE DETECTED NONE DETECTED   Cocaine NONE DETECTED NONE DETECTED   Benzodiazepines NONE DETECTED NONE DETECTED   Amphetamines NONE DETECTED NONE DETECTED   Tetrahydrocannabinol NONE DETECTED NONE DETECTED   Barbiturates NONE DETECTED NONE DETECTED  I-Stat Troponin, ED (not at [facility])  Result Value Ref Range   Troponin i, poc 0.19 (HH) 0.00 - 0.08 ng/mL   Comment NOTIFIED PHYSICIAN    Comment 3          I-Stat CG4 Lactic Acid, ED  Result Value Ref Range   Lactic Acid,  Venous 0.83 0.5 - 1.9 mmol/L  I-stat Chem 8, ED  Result Value Ref Range   Sodium 138 135 - 145 mmol/L   Potassium 4.3 3.5 - 5.1 mmol/L   Chloride 104 101 - 111 mmol/L   BUN 65 (H) 6 - 20 mg/dL   Creatinine, Ser 4.70 (H) 0.61 - 1.24 mg/dL   Glucose, Bld 153 (H) 65 - 99 mg/dL   Calcium, Ion 1.09 (L) 1.15 - 1.40 mmol/L   TCO2 20 (L) 22 - 32 mmol/L   Hemoglobin 11.2 (L) 13.0  - 17.0 g/dL   HCT 33.0 (L) 39.0 - 52.0 %   Dg Chest 2 View Result Date: 06/15/2017 CLINICAL DATA:  Worsening lethargy and weakness.  Low-grade fever. EXAM: CHEST  2 VIEW COMPARISON:  05/26/2017 FINDINGS: Lordotic positioning. Chronic cardiomegaly and aortic atherosclerosis. Lungs appear clear allowing for positioning. There may be central bronchial thickening but there is no consolidation or collapse. No sign of heart failure. IMPRESSION: Chronic cardiomegaly and aortic atherosclerosis. Possible bronchitis but no consolidation or collapse. Electronically Signed   By: Nelson Chimes M.D.   On: 06/15/2017 10:23   Ct Head Wo Contrast Result Date: 06/15/2017 CLINICAL DATA:  Lethargy, generalized weakness, bradycardia, low-grade fever, history of stroke, hypertension, chronic diastolic heart failure, smoker EXAM: CT HEAD WITHOUT CONTRAST TECHNIQUE: Contiguous axial images were obtained from the base of the skull through the vertex without intravenous contrast. Sagittal and coronal MPR images reconstructed from axial data set. COMPARISON:  05/24/2017 FINDINGS: Brain: Generalized atrophy. Normal ventricular morphology. No midline shift or mass effect. Small vessel chronic ischemic changes of deep cerebral white matter. Small old LEFT cerebellar infarct. Old BILATERAL basal ganglia and question tiny thalamic lacunar infarcts. No intracranial hemorrhage, mass lesion, evidence of acute infarction, or extra-axial fluid collection. Vascular: Atherosclerotic calcifications of internal carotid arteries bilaterally at skull base Skull: Intact Sinuses/Orbits: Clear Other: N/A IMPRESSION: Atrophy with small vessel chronic ischemic changes of deep cerebral white matter. Multiple old old infarcts as above. No acute intracranial abnormalities. Electronically Signed   By: Lavonia Dana M.D.   On: 06/15/2017 10:36    1040:  No clear STEMI on current EKG. T/C returned from [facility] Dr. Harl Bowie, case discussed, including:  HPI, pertinent  PM/SHx, VS/PE, dx testing, ED course and treatment:  He as viewed EKG and agrees no acute STEMI, pt can stay at [facility] for admission.  1200:  No fever in ED. No clear UTI on Udip; UC and BC pending. Flu pending. CKD per hx, judicious IVF given. T/C returned from [facility] Dr. Roderic Palau, case discussed, including:  HPI, pertinent PM/SHx, VS/PE, dx testing, ED course and treatment:  Agreeable to admit.    Final Clinical Impressions(s) / ED Diagnoses   Final diagnoses:  Bradycardia  Elevated troponin  Altered mental status, unspecified altered mental status type  Chronic kidney disease, unspecified CKD stage    ED Discharge Orders    None        Francine Graven, DO 06/17/17 2151

## 2017-06-15 NOTE — ED Notes (Signed)
CRITICAL VALUE ALERT  Critical Value:  I-stat troponin  Date & Time Notied:  06/15/17  Provider Notified: Dr. Thurnell Garbe  Orders Received/Actions taken: No new orders at this time

## 2017-06-15 NOTE — Progress Notes (Signed)
Patients heart rate currently 47.  MD notified.  MD stated okay to give catapres.

## 2017-06-15 NOTE — Progress Notes (Signed)
Asked to look at EKG by ER staff Dr Thurnell Garbe. EKG shows sinus brady with severe LVH and related ST/T changes, no evidence of acute infarct. Nonspecific trop in setting of renal failure and absence of cardiopulmonary symptoms. Significant leukocytosis may also suggest infection and potential demand ischemia. Would follow troponin trend at this time. For bradycardia wean clonidine to 0.1mg  bid, if additional bp control needed could start hydralazine. May consult cardiology if needed after initial workup is complete. With cocaine history add UDS.    Carlyle Dolly MD

## 2017-06-15 NOTE — Progress Notes (Signed)
Patient refusing Lab draws.  Explained the reason and importance for the lab draws, patient still refusing.   On call MD notified via text page.  No new orders at this time.

## 2017-06-16 DIAGNOSIS — Z79899 Other long term (current) drug therapy: Secondary | ICD-10-CM | POA: Diagnosis not present

## 2017-06-16 DIAGNOSIS — Z7902 Long term (current) use of antithrombotics/antiplatelets: Secondary | ICD-10-CM | POA: Diagnosis not present

## 2017-06-16 DIAGNOSIS — E860 Dehydration: Secondary | ICD-10-CM | POA: Diagnosis present

## 2017-06-16 DIAGNOSIS — F17210 Nicotine dependence, cigarettes, uncomplicated: Secondary | ICD-10-CM | POA: Diagnosis present

## 2017-06-16 DIAGNOSIS — B9689 Other specified bacterial agents as the cause of diseases classified elsewhere: Secondary | ICD-10-CM | POA: Diagnosis present

## 2017-06-16 DIAGNOSIS — N179 Acute kidney failure, unspecified: Secondary | ICD-10-CM | POA: Diagnosis present

## 2017-06-16 DIAGNOSIS — N390 Urinary tract infection, site not specified: Secondary | ICD-10-CM | POA: Diagnosis present

## 2017-06-16 DIAGNOSIS — I130 Hypertensive heart and chronic kidney disease with heart failure and stage 1 through stage 4 chronic kidney disease, or unspecified chronic kidney disease: Secondary | ICD-10-CM | POA: Diagnosis present

## 2017-06-16 DIAGNOSIS — Z8673 Personal history of transient ischemic attack (TIA), and cerebral infarction without residual deficits: Secondary | ICD-10-CM | POA: Diagnosis not present

## 2017-06-16 DIAGNOSIS — N184 Chronic kidney disease, stage 4 (severe): Secondary | ICD-10-CM | POA: Diagnosis present

## 2017-06-16 DIAGNOSIS — R001 Bradycardia, unspecified: Secondary | ICD-10-CM | POA: Diagnosis present

## 2017-06-16 DIAGNOSIS — Z91018 Allergy to other foods: Secondary | ICD-10-CM | POA: Diagnosis not present

## 2017-06-16 DIAGNOSIS — R7881 Bacteremia: Secondary | ICD-10-CM | POA: Diagnosis present

## 2017-06-16 DIAGNOSIS — Z8659 Personal history of other mental and behavioral disorders: Secondary | ICD-10-CM | POA: Diagnosis not present

## 2017-06-16 DIAGNOSIS — I10 Essential (primary) hypertension: Secondary | ICD-10-CM | POA: Diagnosis not present

## 2017-06-16 DIAGNOSIS — I248 Other forms of acute ischemic heart disease: Secondary | ICD-10-CM | POA: Diagnosis present

## 2017-06-16 DIAGNOSIS — Z7982 Long term (current) use of aspirin: Secondary | ICD-10-CM | POA: Diagnosis not present

## 2017-06-16 DIAGNOSIS — B9620 Unspecified Escherichia coli [E. coli] as the cause of diseases classified elsewhere: Secondary | ICD-10-CM | POA: Diagnosis present

## 2017-06-16 DIAGNOSIS — L89152 Pressure ulcer of sacral region, stage 2: Secondary | ICD-10-CM | POA: Diagnosis present

## 2017-06-16 DIAGNOSIS — E785 Hyperlipidemia, unspecified: Secondary | ICD-10-CM | POA: Diagnosis present

## 2017-06-16 DIAGNOSIS — I69351 Hemiplegia and hemiparesis following cerebral infarction affecting right dominant side: Secondary | ICD-10-CM | POA: Diagnosis not present

## 2017-06-16 DIAGNOSIS — G9341 Metabolic encephalopathy: Secondary | ICD-10-CM | POA: Diagnosis present

## 2017-06-16 DIAGNOSIS — R29705 NIHSS score 5: Secondary | ICD-10-CM | POA: Diagnosis present

## 2017-06-16 DIAGNOSIS — R4182 Altered mental status, unspecified: Secondary | ICD-10-CM | POA: Diagnosis not present

## 2017-06-16 DIAGNOSIS — D631 Anemia in chronic kidney disease: Secondary | ICD-10-CM | POA: Diagnosis present

## 2017-06-16 DIAGNOSIS — I5032 Chronic diastolic (congestive) heart failure: Secondary | ICD-10-CM | POA: Diagnosis present

## 2017-06-16 DIAGNOSIS — R402413 Glasgow coma scale score 13-15, at hospital admission: Secondary | ICD-10-CM | POA: Diagnosis present

## 2017-06-16 LAB — BLOOD CULTURE ID PANEL (REFLEXED)
Acinetobacter baumannii: NOT DETECTED
CANDIDA PARAPSILOSIS: NOT DETECTED
CARBAPENEM RESISTANCE: NOT DETECTED
Candida albicans: NOT DETECTED
Candida glabrata: NOT DETECTED
Candida krusei: NOT DETECTED
Candida tropicalis: NOT DETECTED
ENTEROBACTERIACEAE SPECIES: DETECTED — AB
ENTEROCOCCUS SPECIES: NOT DETECTED
Enterobacter cloacae complex: NOT DETECTED
Escherichia coli: DETECTED — AB
HAEMOPHILUS INFLUENZAE: NOT DETECTED
Klebsiella oxytoca: NOT DETECTED
Klebsiella pneumoniae: NOT DETECTED
LISTERIA MONOCYTOGENES: NOT DETECTED
Neisseria meningitidis: NOT DETECTED
PSEUDOMONAS AERUGINOSA: NOT DETECTED
Proteus species: NOT DETECTED
SERRATIA MARCESCENS: NOT DETECTED
STAPHYLOCOCCUS AUREUS BCID: NOT DETECTED
STAPHYLOCOCCUS SPECIES: NOT DETECTED
STREPTOCOCCUS PNEUMONIAE: NOT DETECTED
STREPTOCOCCUS PYOGENES: NOT DETECTED
STREPTOCOCCUS SPECIES: NOT DETECTED
Streptococcus agalactiae: NOT DETECTED

## 2017-06-16 LAB — BASIC METABOLIC PANEL
ANION GAP: 13 (ref 5–15)
BUN: 79 mg/dL — AB (ref 6–20)
CALCIUM: 8.2 mg/dL — AB (ref 8.9–10.3)
CO2: 20 mmol/L — ABNORMAL LOW (ref 22–32)
CREATININE: 3.7 mg/dL — AB (ref 0.61–1.24)
Chloride: 105 mmol/L (ref 101–111)
GFR calc Af Amer: 18 mL/min — ABNORMAL LOW (ref >60)
GFR, EST NON AFRICAN AMERICAN: 16 mL/min — AB (ref >60)
GLUCOSE: 204 mg/dL — AB (ref 65–99)
Potassium: 3.9 mmol/L (ref 3.5–5.1)
Sodium: 138 mmol/L (ref 135–145)

## 2017-06-16 LAB — CBC
HCT: 26.2 % — ABNORMAL LOW (ref 39.0–52.0)
Hemoglobin: 8.4 g/dL — ABNORMAL LOW (ref 13.0–17.0)
MCH: 25.5 pg — ABNORMAL LOW (ref 26.0–34.0)
MCHC: 32.1 g/dL (ref 30.0–36.0)
MCV: 79.4 fL (ref 78.0–100.0)
Platelets: 285 10*3/uL (ref 150–400)
RBC: 3.3 MIL/uL — ABNORMAL LOW (ref 4.22–5.81)
RDW: 15.6 % — AB (ref 11.5–15.5)
WBC: 10.3 10*3/uL (ref 4.0–10.5)

## 2017-06-16 LAB — TROPONIN I: TROPONIN I: 0.15 ng/mL — AB (ref <0.03)

## 2017-06-16 MED ORDER — SODIUM CHLORIDE 0.9 % IV SOLN
2.0000 g | INTRAVENOUS | Status: DC
  Administered 2017-06-16: 2 g via INTRAVENOUS
  Filled 2017-06-16: qty 2
  Filled 2017-06-16: qty 20

## 2017-06-16 NOTE — Progress Notes (Signed)
Patient still refusing Lab draws.  On call MD notified.  No new orders at this time.  Will continue to monitor.

## 2017-06-16 NOTE — Progress Notes (Signed)
Blood cultures positive for Gram (-) Rods in the Aerobic bottle.  On call MD notified via text page.

## 2017-06-16 NOTE — Progress Notes (Signed)
PHARMACY - PHYSICIAN COMMUNICATION CRITICAL VALUE ALERT - BLOOD CULTURE IDENTIFICATION (BCID)  Lucas [name] is an 67 y.o. male who presented to [facility] on 06/15/2017 with a chief complaint of weakness  Assessment:  BCx with GNR, E Coli most likely from urine  (include suspected source if known)  Name of physician (or Provider) Contacted: Dr Roderic Palau Manuella Ghazi)  Current antibiotics: Continue Rocephin 2gm IV q24hrs  Results for orders placed or performed during the hospital encounter of 06/15/17  Blood Culture ID Panel (Reflexed) (Collected: 06/15/2017  9:55 AM)  Result Value Ref Range   Enterococcus species NOT DETECTED NOT DETECTED   Listeria monocytogenes NOT DETECTED NOT DETECTED   Staphylococcus species NOT DETECTED NOT DETECTED   Staphylococcus aureus NOT DETECTED NOT DETECTED   Streptococcus species NOT DETECTED NOT DETECTED   Streptococcus agalactiae NOT DETECTED NOT DETECTED   Streptococcus pneumoniae NOT DETECTED NOT DETECTED   Streptococcus pyogenes NOT DETECTED NOT DETECTED   Acinetobacter baumannii NOT DETECTED NOT DETECTED   Enterobacteriaceae species DETECTED (A) NOT DETECTED   Enterobacter cloacae complex NOT DETECTED NOT DETECTED   Escherichia coli DETECTED (A) NOT DETECTED   Klebsiella oxytoca NOT DETECTED NOT DETECTED   Klebsiella pneumoniae NOT DETECTED NOT DETECTED   Proteus species NOT DETECTED NOT DETECTED   Serratia marcescens NOT DETECTED NOT DETECTED   Carbapenem resistance NOT DETECTED NOT DETECTED   Haemophilus influenzae NOT DETECTED NOT DETECTED   Neisseria meningitidis NOT DETECTED NOT DETECTED   Pseudomonas aeruginosa NOT DETECTED NOT DETECTED   Candida albicans NOT DETECTED NOT DETECTED   Candida glabrata NOT DETECTED NOT DETECTED   Candida krusei NOT DETECTED NOT DETECTED   Candida parapsilosis NOT DETECTED NOT DETECTED   Candida tropicalis NOT DETECTED NOT DETECTED    Hart Robinsons A 06/16/2017  9:57 AM

## 2017-06-16 NOTE — Progress Notes (Addendum)
PROGRESS NOTE    Lucas [name]  SNK:[id number] DOB: 02/05/1951 DOA: 06/15/2017 PCP: Patient, No Pcp Per    Brief Narrative:  67 year old male was recently in the hospital with acute stroke and discharge to skilled nursing facility, presents with dehydration, urinary tract infection and gram-negative bacteremia.  On intravenous fluids and antibiotic.  Clinically he is improving.  Once final culture results have resulted, can likely discharge back to skilled nursing facility on appropriate antibiotics.  He is improving with IV fluids.  Renal function is returned to baseline.  Anticipate another 24-48 hours in the hospital.   Assessment & Plan:   Active Problems:   HTN (hypertension)   Hyperlipidemia with target LDL less than 100   Altered mental status   Pressure injury of skin   AKI (acute kidney injury) ([place])   CKD (chronic kidney disease) stage 4, GFR 15-29 ml/min ([facility])   History of stroke   Acute lower UTI   Dehydration   1. Acute metabolic encephalopathy.  Patient was noted to be lethargic and confused on arrival to the emergency room.  This was related to volume depletion and infection.  He was hydrated in the emergency room and mental status has returned to baseline. 2. Acute kidney injury on chronic kidney disease stage IV.  Creatinine improving with hydration.  Continue current treatments. 3. Gram-negative bacteremia.  Currently on intravenous ceftriaxone.  Source is likely urine.  Continue to follow further culture data. 4. Urinary tract infection.  Urinalysis indicated possible infection.  Follow-up culture.  Continue intravenous antibiotics for now. 5. Mildly elevated troponin in the setting of renal dysfunction.  Likely demand ischemia in the setting of infection and dehydration.  Troponins are trending down.  No complaints of chest pain or EKG changes. 6. Recent stroke.  Continue antiplatelet agents with aspirin and Plavix 7. Hyperlipidemia.  Continue  statin 8. Hypertension.  Continue on clonidine and amlodipine.  Hydrochlorothiazide currently on hold due to dehydration.  Patient is noted to be bradycardic.  Review of prior discharge records indicate that his heart rate was in the 40s at that time.  This is not appear to be a new finding.  We will continue current treatments. 9. Anemia.  No evidence of bleeding.  Suspect this is heme dilutional with IV fluids.  Continue to monitor. 10. Stage II pressure injury on sacrum.  Present on admission.  Continue supportive measures.   DVT prophylaxis: Lovenox Code Status: Full code Family Communication: No family present Disposition Plan: Return to skilled nursing facility once final culture results available.   Consultants:     Procedures:     Antimicrobials:   Ceftriaxone 3/5 >   Subjective: Did not sleep well last night.  No shortness of breath.  No vomiting.  Objective: Vitals:   06/15/17 1942 06/15/17 2214 06/16/17 0629 06/16/17 1500  BP:  (!) 152/70 (!) 142/68 (!) 138/58  Pulse:  (!) 58 60 (!) 47  Resp:  18 18 18   Temp:  98 F (36.7 C) 98.5 F (36.9 C) 98.3 F (36.8 C)  TempSrc:  Oral Oral Oral  SpO2: 97% 98% 97% 100%  Weight:   51.5 kg (113 lb 8.6 oz)   Height:        Intake/Output Summary (Last 24 hours) at 06/16/2017 1803 Last data filed at 06/16/2017 1736 Gross per 24 hour  Intake 2898.33 ml  Output 2650 ml  Net 248.33 ml   Filed Weights   06/15/17 0950 06/15/17 1402 06/16/17 0629  Weight:  58.1 kg (128 lb) 51.5 kg (113 lb 8.6 oz) 51.5 kg (113 lb 8.6 oz)    Examination:  General exam: Appears calm and comfortable  Respiratory system: Clear to auscultation. Respiratory effort normal. Cardiovascular system: S1 & S2 heard, RRR. No JVD, murmurs, rubs, gallops or clicks. No pedal edema. Gastrointestinal system: Abdomen is nondistended, soft and nontender. No organomegaly or masses felt. Normal bowel sounds heard. Central nervous system: Alert and oriented.   Right-sided hemiparesis present Extremities: no edema, cyanosis Skin: No rashes, lesions or ulcers Psychiatry: Judgement and insight appear normal. Mood & affect appropriate.     Data Reviewed: I have personally reviewed following labs and imaging studies  CBC: Recent Labs  Lab 06/15/17 0955 06/15/17 1009 06/16/17 0951  WBC 18.7*  --  10.3  NEUTROABS 16.8*  --   --   HGB 10.5* 11.2* 8.4*  HCT 31.9* 33.0* 26.2*  MCV 78.6  --  79.4  PLT 317  --  854   Basic Metabolic Panel: Recent Labs  Lab 06/15/17 0955 06/15/17 1009 06/16/17 0951  NA 137 138 138  K 4.2 4.3 3.9  CL 100* 104 105  CO2 18*  --  20*  GLUCOSE 154* 153* 204*  BUN 79* 65* 79*  CREATININE 4.49* 4.70* 3.70*  CALCIUM 8.9  --  8.2*   GFR: Estimated Creatinine Clearance: 14.7 mL/min (A) (by C-G formula based on SCr of 3.7 mg/dL (H)). Liver Function Tests: Recent Labs  Lab 06/15/17 0955  AST 17  ALT 12*  ALKPHOS 72  BILITOT 0.6  PROT 7.8  ALBUMIN 3.0*   No results for input(s): LIPASE, AMYLASE in the last 168 hours. No results for input(s): AMMONIA in the last 168 hours. Coagulation Profile: No results for input(s): INR, PROTIME in the last 168 hours. Cardiac Enzymes: Recent Labs  Lab 06/15/17 1634 06/16/17 0951  TROPONINI 0.24* 0.15*   BNP (last 3 results) No results for input(s): PROBNP in the last 8760 hours. HbA1C: No results for input(s): HGBA1C in the last 72 hours. CBG: No results for input(s): GLUCAP in the last 168 hours. Lipid Profile: No results for input(s): CHOL, HDL, LDLCALC, TRIG, CHOLHDL, LDLDIRECT in the last 72 hours. Thyroid Function Tests: No results for input(s): TSH, T4TOTAL, FREET4, T3FREE, THYROIDAB in the last 72 hours. Anemia Panel: No results for input(s): VITAMINB12, FOLATE, FERRITIN, TIBC, IRON, RETICCTPCT in the last 72 hours. Sepsis Labs: Recent Labs  Lab 06/15/17 0955  LATICACIDVEN 0.83    Recent Results (from the past 240 hour(s))  Blood culture  (routine x 2)     Status: None (Preliminary result)   Collection Time: 06/15/17  9:55 AM  Result Value Ref Range Status   Specimen Description BLOOD RIGHT HAND DRAWN BY RN  Final   Special Requests   Final    BOTTLES DRAWN AEROBIC AND ANAEROBIC Blood Culture results may not be optimal due to an inadequate volume of blood received in culture bottles   Culture   Final    NO GROWTH < 24 HOURS Performed at [facility], [address], [place], [place] [address]    Report Status PENDING  Incomplete  Blood culture (routine x 2)     Status: None (Preliminary result)   Collection Time: 06/15/17  9:55 AM  Result Value Ref Range Status   Specimen Description   Final    BLOOD LEFT HAND Performed at [facility], [address], [place], [place] [address]    Special Requests   Final  BOTTLES DRAWN AEROBIC AND ANAEROBIC Blood Culture adequate volume Performed at [facility], [address], [place], [place] [address]    Culture  Setup Time   Final    GRAM NEGATIVE RODS AEROBIC BOTTLE ONLY Gram Stain Report Called to,Read Back By and Verified With: HANDY,T @ 5462 ON 3.6.19 BY BOWMAN,L CRITICAL RESULT CALLED TO, READ BACK BY AND VERIFIED WITH: Shireen Quan [id number] MLM Performed at [facility], [place] [address], [place], [place] [address]    Culture GRAM NEGATIVE RODS  Final   Report Status PENDING  Incomplete  Blood Culture ID Panel (Reflexed)     Status: Abnormal   Collection Time: 06/15/17  9:55 AM  Result Value Ref Range Status   Enterococcus species NOT DETECTED NOT DETECTED Final   Listeria monocytogenes NOT DETECTED NOT DETECTED Final   Staphylococcus species NOT DETECTED NOT DETECTED Final   Staphylococcus aureus NOT DETECTED NOT DETECTED Final   Streptococcus species NOT DETECTED NOT DETECTED Final   Streptococcus agalactiae NOT DETECTED NOT DETECTED Final   Streptococcus pneumoniae NOT DETECTED NOT DETECTED Final   Streptococcus pyogenes NOT DETECTED NOT DETECTED Final    Acinetobacter baumannii NOT DETECTED NOT DETECTED Final   Enterobacteriaceae species DETECTED (A) NOT DETECTED Final    Comment: Enterobacteriaceae represent a large family of gram-negative bacteria, not a single organism. CRITICAL RESULT CALLED TO, READ BACK BY AND VERIFIED WITH: PHARMD S HALL [id number] [phone] MLM    Enterobacter cloacae complex NOT DETECTED NOT DETECTED Final   Escherichia coli DETECTED (A) NOT DETECTED Final    Comment: CRITICAL RESULT CALLED TO, READ BACK BY AND VERIFIED WITH: PHARMD S HALL [id number] MLM    Klebsiella oxytoca NOT DETECTED NOT DETECTED Final   Klebsiella pneumoniae NOT DETECTED NOT DETECTED Final   Proteus species NOT DETECTED NOT DETECTED Final   Serratia marcescens NOT DETECTED NOT DETECTED Final   Carbapenem resistance NOT DETECTED NOT DETECTED Final   Haemophilus influenzae NOT DETECTED NOT DETECTED Final   Neisseria meningitidis NOT DETECTED NOT DETECTED Final   Pseudomonas aeruginosa NOT DETECTED NOT DETECTED Final   Candida albicans NOT DETECTED NOT DETECTED Final   Candida glabrata NOT DETECTED NOT DETECTED Final   Candida krusei NOT DETECTED NOT DETECTED Final   Candida parapsilosis NOT DETECTED NOT DETECTED Final   Candida tropicalis NOT DETECTED NOT DETECTED Final    Comment: Performed at [facility], [place] [address], [place], [place] [address]  Surgical PCR screen     Status: None   Collection Time: 06/15/17  2:35 PM  Result Value Ref Range Status   MRSA, PCR NEGATIVE NEGATIVE Final   Staphylococcus aureus NEGATIVE NEGATIVE Final    Comment: (NOTE) The Xpert SA Assay (FDA approved for NASAL specimens in patients 44 years of age and older), is one component of a comprehensive surveillance program. It is not intended to diagnose infection nor to guide or monitor treatment. Performed at [facility], [address], [place], [place] [address]          Radiology Studies: Dg Chest 2 View  Result Date: 06/15/2017 CLINICAL DATA:   Worsening lethargy and weakness.  Low-grade fever. EXAM: CHEST  2 VIEW COMPARISON:  05/26/2017 FINDINGS: Lordotic positioning. Chronic cardiomegaly and aortic atherosclerosis. Lungs appear clear allowing for positioning. There may be central bronchial thickening but there is no consolidation or collapse. No sign of heart failure. IMPRESSION: Chronic cardiomegaly and aortic atherosclerosis. Possible bronchitis but no consolidation or collapse.  Electronically Signed   By: Nelson Chimes M.D.   On: 06/15/2017 10:23   Ct Head Wo Contrast  Result Date: 06/15/2017 CLINICAL DATA:  Lethargy, generalized weakness, bradycardia, low-grade fever, history of stroke, hypertension, chronic diastolic heart failure, smoker EXAM: CT HEAD WITHOUT CONTRAST TECHNIQUE: Contiguous axial images were obtained from the base of the skull through the vertex without intravenous contrast. Sagittal and coronal MPR images reconstructed from axial data set. COMPARISON:  05/24/2017 FINDINGS: Brain: Generalized atrophy. Normal ventricular morphology. No midline shift or mass effect. Small vessel chronic ischemic changes of deep cerebral white matter. Small old LEFT cerebellar infarct. Old BILATERAL basal ganglia and question tiny thalamic lacunar infarcts. No intracranial hemorrhage, mass lesion, evidence of acute infarction, or extra-axial fluid collection. Vascular: Atherosclerotic calcifications of internal carotid arteries bilaterally at skull base Skull: Intact Sinuses/Orbits: Clear Other: N/A IMPRESSION: Atrophy with small vessel chronic ischemic changes of deep cerebral white matter. Multiple old old infarcts as above. No acute intracranial abnormalities. Electronically Signed   By: Lavonia Dana M.D.   On: 06/15/2017 10:36        Scheduled Meds: . amLODipine  10 mg Oral Daily  . aspirin  325 mg Oral Daily  . atorvastatin  40 mg Oral q1800  . cloNIDine  0.2 mg Oral BID  . clopidogrel  75 mg Oral Daily  . enoxaparin (LOVENOX)  injection  30 mg Subcutaneous Q24H  . folic acid  1 mg Oral Daily   Continuous Infusions: . sodium chloride Stopped (06/16/17 0822)  . sodium chloride 100 mL/hr at 06/16/17 0823  . cefTRIAXone (ROCEPHIN)  IV Stopped (06/16/17 1737)     LOS: 0 days    Time spent: 13mins    Kathie Dike, MD [facility] Pager [phone]  If 7PM-7AM, please contact night-coverage [email or web address] Password [facility] 06/16/2017, 6:03 PM

## 2017-06-16 NOTE — Progress Notes (Signed)
Rocephin dosing changed to 2g IV every 24 hours on account of GNR noted on blood culture.

## 2017-06-17 DIAGNOSIS — R7881 Bacteremia: Secondary | ICD-10-CM | POA: Diagnosis present

## 2017-06-17 DIAGNOSIS — Z8673 Personal history of transient ischemic attack (TIA), and cerebral infarction without residual deficits: Secondary | ICD-10-CM

## 2017-06-17 MED ORDER — CEFUROXIME AXETIL 250 MG PO TABS: 250.0000 mg | ORAL_TABLET | Freq: Two times a day (BID) | ORAL | 0 refills | Status: AC

## 2017-06-17 NOTE — Discharge Instructions (Signed)
Please continue Aspirin 325 mg daily along with Plavix 75 mg daily thru 08/23/17 then stop Aspirin and continue Plavix Alone on 08/24/17.    Please continue antibiotics thru 06/30/17  Follow with Primary MD  Patient, No Pcp Per  and other consultant's as instructed your Hospitalist MD  Please get a complete blood count and chemistry panel checked by your Primary MD at your next visit, and again as instructed by your Primary MD.  Get Medicines reviewed and adjusted: Please take all your medications with you for your next visit with your Primary MD  Laboratory/radiological data: Please request your Primary MD to go over all hospital tests and procedure/radiological results at the follow up, please ask your Primary MD to get all Hospital records sent to his/her office.  In some cases, they will be blood work, cultures and biopsy results pending at the time of your discharge. Please request that your primary care M.D. follows up on these results.  Also Note the following: If you experience worsening of your admission symptoms, develop shortness of breath, life threatening emergency, suicidal or homicidal thoughts you must seek medical attention immediately by calling 911 or calling your MD immediately  if symptoms less severe.  You must read complete instructions/literature along with all the possible adverse reactions/side effects for all the Medicines you take and that have been prescribed to you. Take any new Medicines after you have completely understood and accpet all the possible adverse reactions/side effects.   Do not drive when taking Pain medications or sleeping medications (Benzodaizepines)  Do not take more than prescribed Pain, Sleep and Anxiety Medications. It is not advisable to combine anxiety,sleep and pain medications without talking with your primary care practitioner  Special Instructions: If you have smoked or chewed Tobacco  in the last 2 yrs please stop smoking, stop any  regular Alcohol  and or any Recreational drug use.  Wear Seat belts while driving.  Please note: You were cared for by a hospitalist during your hospital stay. Once you are discharged, your primary care physician will handle any further medical issues. Please note that NO REFILLS for any discharge medications will be authorized once you are discharged, as it is imperative that you return to your primary care physician (or establish a relationship with a primary care physician if you do not have one) for your post hospital discharge needs so that they can reassess your need for medications and monitor your lab values.

## 2017-06-17 NOTE — Discharge Summary (Signed)
Physician Discharge Summary  Lucas [name] NLG:[id number] DOB: [birth year] DOA: 06/15/2017  PCP: Patient, No Pcp Per  Admit date: 06/15/2017 Discharge date: 06/17/2017  Admitted From: SNF Disposition: SNF  Recommended SNF Orders  1. Follow up with [facility] IN 4 WEEKS 2. Please obtain BMP/CBC in 2 weeks 3. Please arrange for outpatient nephrology appointment regarding CKD.  4. Please follow up on the following pending results: FINAL CULTURE DATA  PLEASE COMPLETE FULL COURSE OF ANTIBIOTICS FOR E COLI BACTEREMIA FROM UTI.   Please continue Aspirin 325 mg daily along with Plavix 75 mg daily thru 08/23/17 then stop Aspirin and continue Plavix Alone on 08/24/17.    Please continue antibiotics thru 06/30/17  Discharge Condition: STABLE  CODE STATUS: FULL    Brief Hospitalization Summary: Please see all hospital notes, images, labs for full details of the hospitalization.  HPI: Lucas [name] is a 67 y.o. 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  He also has a history of chronic kidney disease stage IV, hypertension, bradycardia with a heart rate in the 40s, hyperlipidemia.  Patient was discharged to skilled nursing facility after his last admission.  He was brought back to the emergency room today with "low-grade fever", generalized weakness and lethargy.  At the time of my evaluation, patient does not appear to be confused.  He knows he is in the hospital, but is unsure why he was brought here.  Denies any vomiting, diarrhea, significant cough or shortness of breath.  He has not had any chest pain.  ED Course: Vitals were noted to be stable in the emergency room.  He was not febrile.  He has had a rising creatinine to 4.49.  This was previously 3.2 on last discharge.  WBC count elevated at 18.7.  Hemoglobin of 10.5.  Chest x-ray did not show any obvious pneumonia.  Urinalysis indicates too numerous to count WBCs.   Urine drug screen is negative.  CT head is unremarkable.  Point-of-care troponin mildly elevated at 0.19.  Patient has been referred for admission.  1. Acute metabolic encephalopathy.  Patient was noted to be lethargic and confused on arrival to the emergency room.  This was related to volume depletion and infection.  He was hydrated in the emergency room and mental status has returned to baseline.  He is eating and drinking well with no problems.  2. Acute kidney injury on chronic kidney disease stage IV.  Creatinine improving with hydration.  Continue current treatments.  Recommend outpatient nephrology appointment for CKD and blood pressure management.  3. E. COLI Gram-negative bacteremia.  Treated with intravenous ceftriaxone and will discharge on oral ceftin x 13 more days.  Continue to follow further culture data. 4. E. Coli Urinary tract infection.  Follow-up culture.   Treated with intravenous antibiotics in hospital and discharging on oral ceftin. 5. Mildly elevated troponin in the setting of renal dysfunction.  Likely demand ischemia in the setting of infection and dehydration.  Troponins are trending down.  No complaints of chest pain or EKG changes. 6. Recent stroke.  Continue antiplatelet agents with aspirin and Plavix for 90 days thru 08/23/17 then on 08/24/17 Plavix Alone.   7. Hyperlipidemia.  Continue statin.   8. Hypertension.  Continue on clonidine and amlodipine.  Hydrochlorothiazide discontinued.  Patient is noted to be bradycardic.  Review of prior discharge records indicate that his heart rate was in the 40s at that time.  This  is not appear to be a new finding.  We will continue current treatments. 9. Anemia.  No evidence of bleeding.  Suspect this is heme dilutional with IV fluids.  Continue to monitor. 10. Stage II pressure injury on sacrum.  Present on admission.  Continue supportive measures.  DVT prophylaxis: Lovenox Code Status: Full code Family Communication: No family  present Disposition Plan: Return to skilled nursing facility  Discharge Diagnoses:  Principal Problem:   E coli bacteremia Active Problems:   HTN (hypertension)   Hyperlipidemia with target LDL less than 100   Altered mental status   Pressure injury of skin   AKI (acute kidney injury) ([place])   CKD (chronic kidney disease) stage 4, GFR 15-29 ml/min (H[facility])   History of stroke   Acute lower UTI   Dehydration  Discharge Instructions: Discharge Instructions    Call MD for:  difficulty breathing, headache or visual disturbances   Complete by:  As directed    Call MD for:  extreme fatigue   Complete by:  As directed    Call MD for:  persistant dizziness or light-headedness   Complete by:  As directed    Call MD for:  persistant nausea and vomiting   Complete by:  As directed    Increase activity slowly   Complete by:  As directed      Allergies as of 06/17/2017      Reactions   Pork-derived Products Other (See Comments)   Patient does not eat pork products.      Medication List    STOP taking these medications   hydrochlorothiazide 25 MG tablet Commonly known as:  HYDRODIURIL     TAKE these medications   amLODipine 10 MG tablet Commonly known as:  NORVASC Take 1 tablet (10 mg total) by mouth daily.   aspirin 325 MG EC tablet Take 1 tablet (325 mg total) by mouth daily.   atorvastatin 40 MG tablet Commonly known as:  LIPITOR Take 1 tablet (40 mg total) by mouth daily at 6 PM.   cefUROXime 250 MG tablet Commonly known as:  CEFTIN Take 1 tablet (250 mg total) by mouth 2 (two) times daily with a meal for 13 days.   cloNIDine 0.2 MG tablet Commonly known as:  CATAPRES Take 1 tablet (0.2 mg total) by mouth 2 (two) times daily.   clopidogrel 75 MG tablet Commonly known as:  PLAVIX Take 1 tablet (75 mg total) by mouth daily.   folic acid 1 MG tablet Commonly known as:  FOLVITE Take 1 tablet (1 mg total) by mouth daily.      Follow-up Information    Dennie Bible, NP. Schedule an appointment as soon as possible for a visit in 5 week(s).   Specialty:  Family Medicine Why:  Stroke Follow Up Appointment.  Contact information: [address] [place] [address] [phone]          Allergies  Allergen Reactions  . Pork-Derived Products Other (See Comments)    Patient does not eat pork products.   Allergies as of 06/17/2017      Reactions   Pork-derived Products Other (See Comments)   Patient does not eat pork products.      Medication List    STOP taking these medications   hydrochlorothiazide 25 MG tablet Commonly known as:  HYDRODIURIL     TAKE these medications   amLODipine 10 MG tablet Commonly known as:  NORVASC Take 1 tablet (10 mg total)  by mouth daily.   aspirin 325 MG EC tablet Take 1 tablet (325 mg total) by mouth daily.   atorvastatin 40 MG tablet Commonly known as:  LIPITOR Take 1 tablet (40 mg total) by mouth daily at 6 PM.   cefUROXime 250 MG tablet Commonly known as:  CEFTIN Take 1 tablet (250 mg total) by mouth 2 (two) times daily with a meal for 13 days.   cloNIDine 0.2 MG tablet Commonly known as:  CATAPRES Take 1 tablet (0.2 mg total) by mouth 2 (two) times daily.   clopidogrel 75 MG tablet Commonly known as:  PLAVIX Take 1 tablet (75 mg total) by mouth daily.   folic acid 1 MG tablet Commonly known as:  FOLVITE Take 1 tablet (1 mg total) by mouth daily.      Procedures/Studies: Dg Chest 2 View  Result Date: 06/15/2017 CLINICAL DATA:  Worsening lethargy and weakness.  Low-grade fever. EXAM: CHEST  2 VIEW COMPARISON:  05/26/2017 FINDINGS: Lordotic positioning. Chronic cardiomegaly and aortic atherosclerosis. Lungs appear clear allowing for positioning. There may be central bronchial thickening but there is no consolidation or collapse. No sign of heart failure. IMPRESSION: Chronic cardiomegaly and aortic atherosclerosis. Possible bronchitis but no consolidation or collapse.  Electronically Signed   By: Nelson Chimes M.D.   On: 06/15/2017 10:23   Ct Head Wo Contrast  Result Date: 06/15/2017 CLINICAL DATA:  Lethargy, generalized weakness, bradycardia, low-grade fever, history of stroke, hypertension, chronic diastolic heart failure, smoker EXAM: CT HEAD WITHOUT CONTRAST TECHNIQUE: Contiguous axial images were obtained from the base of the skull through the vertex without intravenous contrast. Sagittal and coronal MPR images reconstructed from axial data set. COMPARISON:  05/24/2017 FINDINGS: Brain: Generalized atrophy. Normal ventricular morphology. No midline shift or mass effect. Small vessel chronic ischemic changes of deep cerebral white matter. Small old LEFT cerebellar infarct. Old BILATERAL basal ganglia and question tiny thalamic lacunar infarcts. No intracranial hemorrhage, mass lesion, evidence of acute infarction, or extra-axial fluid collection. Vascular: Atherosclerotic calcifications of internal carotid arteries bilaterally at skull base Skull: Intact Sinuses/Orbits: Clear Other: N/A IMPRESSION: Atrophy with small vessel chronic ischemic changes of deep cerebral white matter. Multiple old old infarcts as above. No acute intracranial abnormalities. Electronically Signed   By: Lavonia Dana M.D.   On: 06/15/2017 10:36   Ct Head Wo Contrast  Result Date: 05/24/2017 CLINICAL DATA:  Right-sided weakness beginning yesterday. History of stroke. EXAM: CT HEAD WITHOUT CONTRAST TECHNIQUE: Contiguous axial images were obtained from the base of the skull through the vertex without intravenous contrast. COMPARISON:  Head CT and MRI 04/24/2015 FINDINGS: Brain: There is an interval infarct anteriorly in the left basal ganglia which is chronic. Additional new lacunar infarcts in the left corona radiata and superior left lentiform nucleus region are less well defined and may be acute or subacute (series 2, images 18 and 20). Additional chronic infarcts are again seen involving the right  basal ganglia, thalami, and deep cerebral white matter bilaterally. There is mild ex vacuo enlargement of the frontal horns of both lateral ventricles. There is no evidence of acute large territory infarct, intracranial hemorrhage, mass, midline shift, or extra-axial fluid collection. There is mild cerebral atrophy. Mild periventricular white matter hypodensities bilaterally are compatible with chronic small vessel ischemic disease. Vascular: Calcified atherosclerosis at the skull base. No hyperdense vessel. Skull: No fracture focal osseous lesion. Sinuses/Orbits: Mild posterior left ethmoid air cell opacification. Clear mastoid air cells. Unremarkable orbits. Other: None. IMPRESSION: 1. No evidence of  acute large territory infarct or intracranial hemorrhage. 2. New lacunar infarcts in the left corona radiata/superior basal ganglia, possibly acute or subacute. 3. 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 Electronically Signed   By: Logan Bores M.D.   On: 05/24/2017 11:21   Mr Jodene Nam Head Wo Contrast  Result Date: 05/26/2017 CLINICAL DATA:  RIGHT-sided weakness beginning today. Not taking hypertensive medication. Recent cocaine use. History of hypertension, polysubstance abuse, hyperlipidemia and stroke. EXAM: MRI HEAD WITHOUT CONTRAST MRA HEAD WITHOUT CONTRAST TECHNIQUE: Multiplanar, multiecho pulse sequences of the brain and surrounding structures were obtained without intravenous contrast. Angiographic images of the head were obtained using MRA technique without contrast. COMPARISON:  CT HEAD May 24, 2017 and MRI of the head April 24, 2015 FINDINGS: MRI HEAD FINDINGS INTRACRANIAL CONTENTS: Acute subcentimeter reduced diffusion with low ADC values lateral LEFT thalamus. Punctate focus of reduced diffusion LEFT frontal lobe, too small to characterize on ADC map. Old bilateral basal ganglia and RIGHT thalamus infarcts. Ex vacuo dilatation lateral ventricles. Scattered  chronic micro hemorrhages in a pattern seen with chronic hypertension. Moderate global parenchymal brain volume loss. No hydrocephalus. Old pontine lacunar infarcts. Patchy to confluent supratentorial white matter FLAIR T2 hyperintensities. No midline shift, mass effect or masses. No abnormal extra-axial fluid collections. VASCULAR: Normal major intracranial vascular flow voids present at skull base. SKULL AND UPPER CERVICAL SPINE: No abnormal sellar expansion. No suspicious calvarial bone marrow signal. Craniocervical junction maintained. SINUSES/ORBITS: Mild paranasal sinus mucosal thickening without air-fluid levels. Mastoid air cells are well aerated.The included ocular globes and orbital contents are non-suspicious. OTHER: None. MRA HEAD FINDINGS ANTERIOR CIRCULATION: Patent new bilateral internal carotid artery's with severe stenosis RIGHT cavernous ICA, RIGHT supraclinoid ICA. Slow flow RIGHT middle cerebral artery, severe stenosis RIGHT M2 segment inferior division. Patent anterior middle cerebral arteries. No large vessel occlusion, flow limiting stenosis, aneurysm. POSTERIOR CIRCULATION: Codominant vertebral arteries. Fenestrated proximal basilar artery. Basilar artery is patent, with normal flow related enhancement of the main branch vessels. Patent posterior cerebral arteries palpable luminal irregularity compatible with atherosclerosis. No large vessel occlusion, flow limiting stenosis,  aneurysm. ANATOMIC VARIANTS: Hypoplastic RIGHT A1 segment. Supernumerary anterior cerebral artery arising from LEFT A1-2 junction. Source images and MIP images were reviewed. IMPRESSION: MRI HEAD: 1. Acute subcentimeter nonhemorrhagic LEFT thalamus infarct. Acute versus subacute LEFT frontal lobe punctate infarct. 2. Old bilateral basal ganglia and thalamus infarcts. Old pontine lacunar infarcts. 3. Moderate chronic small vessel ischemic disease. 4. Moderate parenchymal brain volume loss. MRA HEAD: 1. No emergent large  vessel occlusion. 2. Severe stenosis RIGHT ICA cavernous segment. Slow flow RIGHT MCA with severe stenosis RIGHT M2 origin. Electronically Signed   By: Elon Alas M.D.   On: 05/26/2017 04:15   Mr Brain Wo Contrast  Result Date: 05/26/2017 CLINICAL DATA:  RIGHT-sided weakness beginning today. Not taking hypertensive medication. Recent cocaine use. History of hypertension, polysubstance abuse, hyperlipidemia and stroke. EXAM: MRI HEAD WITHOUT CONTRAST MRA HEAD WITHOUT CONTRAST TECHNIQUE: Multiplanar, multiecho pulse sequences of the brain and surrounding structures were obtained without intravenous contrast. Angiographic images of the head were obtained using MRA technique without contrast. COMPARISON:  CT HEAD May 24, 2017 and MRI of the head April 24, 2015 FINDINGS: MRI HEAD FINDINGS INTRACRANIAL CONTENTS: Acute subcentimeter reduced diffusion with low ADC values lateral LEFT thalamus. Punctate focus of reduced diffusion LEFT frontal lobe, too small to characterize on ADC map. Old bilateral basal ganglia and RIGHT thalamus infarcts. Ex vacuo dilatation lateral ventricles. Scattered chronic  micro hemorrhages in a pattern seen with chronic hypertension. Moderate global parenchymal brain volume loss. No hydrocephalus. Old pontine lacunar infarcts. Patchy to confluent supratentorial white matter FLAIR T2 hyperintensities. No midline shift, mass effect or masses. No abnormal extra-axial fluid collections. VASCULAR: Normal major intracranial vascular flow voids present at skull base. SKULL AND UPPER CERVICAL SPINE: No abnormal sellar expansion. No suspicious calvarial bone marrow signal. Craniocervical junction maintained. SINUSES/ORBITS: Mild paranasal sinus mucosal thickening without air-fluid levels. Mastoid air cells are well aerated.The included ocular globes and orbital contents are non-suspicious. OTHER: None. MRA HEAD FINDINGS ANTERIOR CIRCULATION: Patent new bilateral internal carotid artery's  with severe stenosis RIGHT cavernous ICA, RIGHT supraclinoid ICA. Slow flow RIGHT middle cerebral artery, severe stenosis RIGHT M2 segment inferior division. Patent anterior middle cerebral arteries. No large vessel occlusion, flow limiting stenosis, aneurysm. POSTERIOR CIRCULATION: Codominant vertebral arteries. Fenestrated proximal basilar artery. Basilar artery is patent, with normal flow related enhancement of the main branch vessels. Patent posterior cerebral arteries palpable luminal irregularity compatible with atherosclerosis. No large vessel occlusion, flow limiting stenosis,  aneurysm. ANATOMIC VARIANTS: Hypoplastic RIGHT A1 segment. Supernumerary anterior cerebral artery arising from LEFT A1-2 junction. Source images and MIP images were reviewed. IMPRESSION: MRI HEAD: 1. Acute subcentimeter nonhemorrhagic LEFT thalamus infarct. Acute versus subacute LEFT frontal lobe punctate infarct. 2. Old bilateral basal ganglia and thalamus infarcts. Old pontine lacunar infarcts. 3. Moderate chronic small vessel ischemic disease. 4. Moderate parenchymal brain volume loss. MRA HEAD: 1. No emergent large vessel occlusion. 2. Severe stenosis RIGHT ICA cavernous segment. Slow flow RIGHT MCA with severe stenosis RIGHT M2 origin. Electronically Signed   By: Elon Alas M.D.   On: 05/26/2017 04:15   Dg Chest Port 1 View  Result Date: 05/26/2017 CLINICAL DATA:  Respiratory failure. EXAM: PORTABLE CHEST 1 VIEW COMPARISON:  05/24/2017 FINDINGS: The cardiac silhouette remains enlarged. Aortic atherosclerosis is noted. Interstitial coarsening is similar to the prior study. No lobar consolidation, overt edema, sizable pleural effusion, or pneumothorax is identified. IMPRESSION: Cardiomegaly and chronic bronchitic changes without overt edema. Electronically Signed   By: Logan Bores M.D.   On: 05/26/2017 06:56   Dg Chest Portable 1 View  Result Date: 05/24/2017 CLINICAL DATA:  Malaise, no other symptoms. The patient  is a poor historian. History of hypertension, hyperlipidemia, current smoker. EXAM: PORTABLE CHEST 1 VIEW COMPARISON:  PA and lateral chest x-ray of December 09, 2010 FINDINGS: The lungs are well-expanded. The interstitial markings are mildly prominent but stable. The cardiac silhouette is enlarged. The pulmonary vascularity is slightly more conspicuous today. The trachea is midline. There calcification in the wall of the aortic arch. The bony thorax exhibits no acute abnormality. IMPRESSION: Chronic bronchitic-smoking related changes. Mild cardiomegaly with minimal central pulmonary vascular prominence. This may reflect low-grade CHF. No acute pneumonia. Thoracic aortic atherosclerosis. Electronically Signed   By: David  Martinique M.D.   On: 05/24/2017 10:50      Subjective: Awake, alert, NAD, refusing labs and care, wants to leave, no specific complaints.   Discharge Exam: Vitals:   06/17/17 0657 06/17/17 0939  BP: 132/77 (!) 145/54  Pulse: 66 (!) 46  Resp: 18 18  Temp: 98.4 F (36.9 C)   SpO2: 95% 100%   Vitals:   06/16/17 1941 06/16/17 2052 06/17/17 0657 06/17/17 0939  BP:  129/64 132/77 (!) 145/54  Pulse:  60 66 (!) 46  Resp:  18 18 18   Temp:  98.2 F (36.8 C) 98.4 F (36.9 C)   TempSrc:  Oral Oral   SpO2: 96% 95% 95% 100%  Weight:      Height:        General exam: Appears calm and comfortable  Respiratory system: Clear to auscultation. Respiratory effort normal. Cardiovascular system: S1 & S2 heard, RRR. No JVD, murmurs, rubs, gallops or clicks. No pedal edema. Gastrointestinal system: Abdomen is nondistended, soft and nontender. No organomegaly or masses felt. Normal bowel sounds heard. Central nervous system: Alert and oriented.  dense Right-sided hemiparesis present.  Extremities: no edema, cyanosis Skin: No rashes, lesions or ulcers Psychiatry: Judgement and insight appear normal. Mood & affect appropriate.    The results of significant diagnostics from this  hospitalization (including imaging, microbiology, ancillary and laboratory) are listed below for reference.     Microbiology: Recent Results (from the past 240 hour(s))  Blood culture (routine x 2)     Status: None (Preliminary result)   Collection Time: 06/15/17  9:55 AM  Result Value Ref Range Status   Specimen Description BLOOD RIGHT HAND DRAWN BY RN  Final   Special Requests   Final    BOTTLES DRAWN AEROBIC AND ANAEROBIC Blood Culture results may not be optimal due to an inadequate volume of blood received in culture bottles   Culture   Final    NO GROWTH 2 DAYS Performed at [facility], [address] [place], [place] [address]    Report Status PENDING  Incomplete  Blood culture (routine x 2)     Status: Abnormal (Preliminary result)   Collection Time: 06/15/17  9:55 AM  Result Value Ref Range Status   Specimen Description   Final    BLOOD LEFT HAND Performed at [facility], [address], [place], [place] [address]    Special Requests   Final    BOTTLES DRAWN AEROBIC AND ANAEROBIC Blood Culture adequate volume Performed at [place] [place], [place] [address]    Culture  Setup Time   Final    GRAM NEGATIVE RODS AEROBIC BOTTLE ONLY Gram Stain Report Called to,Read Back By and Verified With: HANDY,T @ 0433 ON 3.6.19 BY BOWMAN,L CRITICAL RESULT CALLED TO, READ BACK BY AND VERIFIED WITH: PHARMD S HALL [id number] MLM    Culture (A)  Final    ESCHERICHIA COLI SUSCEPTIBILITIES TO FOLLOW Performed at [facility], [place] [address], [place], [place] [address]    Report Status PENDING  Incomplete  Blood Culture ID Panel (Reflexed)     Status: Abnormal   Collection Time: 06/15/17  9:55 AM  Result Value Ref Range Status   Enterococcus species NOT DETECTED NOT DETECTED Final   Listeria monocytogenes NOT DETECTED NOT DETECTED Final   Staphylococcus species NOT DETECTED NOT DETECTED Final   Staphylococcus aureus NOT DETECTED NOT DETECTED Final    Streptococcus species NOT DETECTED NOT DETECTED Final   Streptococcus agalactiae NOT DETECTED NOT DETECTED Final   Streptococcus pneumoniae NOT DETECTED NOT DETECTED Final   Streptococcus pyogenes NOT DETECTED NOT DETECTED Final   Acinetobacter baumannii NOT DETECTED NOT DETECTED Final   Enterobacteriaceae species DETECTED (A) NOT DETECTED Final    Comment: Enterobacteriaceae represent a large family of gram-negative bacteria, not a single organism. CRITICAL RESULT CALLED TO, READ BACK BY AND VERIFIED WITH: PHARMD S HALL [id number] MLM    Enterobacter cloacae complex NOT DETECTED NOT DETECTED Final   Escherichia coli DETECTED (A) NOT DETECTED Final    Comment: CRITICAL RESULT CALLED TO, READ BACK BY AND VERIFIED WITH: PHARMD  S HALL [id number] MLM    Klebsiella oxytoca NOT DETECTED NOT DETECTED Final   Klebsiella pneumoniae NOT DETECTED NOT DETECTED Final   Proteus species NOT DETECTED NOT DETECTED Final   Serratia marcescens NOT DETECTED NOT DETECTED Final   Carbapenem resistance NOT DETECTED NOT DETECTED Final   Haemophilus influenzae NOT DETECTED NOT DETECTED Final   Neisseria meningitidis NOT DETECTED NOT DETECTED Final   Pseudomonas aeruginosa NOT DETECTED NOT DETECTED Final   Candida albicans NOT DETECTED NOT DETECTED Final   Candida glabrata NOT DETECTED NOT DETECTED Final   Candida krusei NOT DETECTED NOT DETECTED Final   Candida parapsilosis NOT DETECTED NOT DETECTED Final   Candida tropicalis NOT DETECTED NOT DETECTED Final    Comment: Performed at [facility], [place] [address], [place], [place] [address]  Urine culture     Status: Abnormal (Preliminary result)   Collection Time: 06/15/17 10:01 AM  Result Value Ref Range Status   Specimen Description   Final    URINE, CATHETERIZED Performed at [facility], [address], [place], [place] [address]    Special Requests   Final    Normal Performed at [facility], [address] [place], [place] [address]     Culture >=100,000 COLONIES/mL ESCHERICHIA COLI (A)  Final   Report Status PENDING  Incomplete  Surgical PCR screen     Status: None   Collection Time: 06/15/17  2:35 PM  Result Value Ref Range Status   MRSA, PCR NEGATIVE NEGATIVE Final   Staphylococcus aureus NEGATIVE NEGATIVE Final    Comment: (NOTE) The Xpert SA Assay (FDA approved for NASAL specimens in patients 62 years of age and older), is one component of a comprehensive surveillance program. It is not intended to diagnose infection nor to guide or monitor treatment. Performed at [facility], [address], [place], [place] [address]      Labs: BNP (last 3 results) Recent Labs    06/15/17 0955  BNP 932.3*   Basic Metabolic Panel: Recent Labs  Lab 06/15/17 0955 06/15/17 1009 06/16/17 0951  NA 137 138 138  K 4.2 4.3 3.9  CL 100* 104 105  CO2 18*  --  20*  GLUCOSE 154* 153* 204*  BUN 79* 65* 79*  CREATININE 4.49* 4.70* 3.70*  CALCIUM 8.9  --  8.2*   Liver Function Tests: Recent Labs  Lab 06/15/17 0955  AST 17  ALT 12*  ALKPHOS 72  BILITOT 0.6  PROT 7.8  ALBUMIN 3.0*   No results for input(s): LIPASE, AMYLASE in the last 168 hours. No results for input(s): AMMONIA in the last 168 hours. CBC: Recent Labs  Lab 06/15/17 0955 06/15/17 1009 06/16/17 0951  WBC 18.7*  --  10.3  NEUTROABS 16.8*  --   --   HGB 10.5* 11.2* 8.4*  HCT 31.9* 33.0* 26.2*  MCV 78.6  --  79.4  PLT 317  --  285   Cardiac Enzymes: Recent Labs  Lab 06/15/17 1634 06/16/17 0951  TROPONINI 0.24* 0.15*   BNP: Invalid input(s): POCBNP CBG: No results for input(s): GLUCAP in the last 168 hours. D-Dimer No results for input(s): DDIMER in the last 72 hours. Hgb A1c No results for input(s): HGBA1C in the last 72 hours. Lipid Profile No results for input(s): CHOL, HDL, LDLCALC, TRIG, CHOLHDL, LDLDIRECT in the last 72 hours. Thyroid function studies No results for input(s): TSH, T4TOTAL, T3FREE, THYROIDAB in the last 72  hours.  Invalid input(s): FREET3 Anemia work up  No results for input(s): VITAMINB12, FOLATE, FERRITIN, TIBC, IRON, RETICCTPCT in the last 72 hours. Urinalysis    Component Value Date/Time   COLORURINE YELLOW 06/15/2017 0955   APPEARANCEUR CLOUDY (A) 06/15/2017 0955   LABSPEC 1.012 06/15/2017 0955   PHURINE 5.0 06/15/2017 0955   GLUCOSEU NEGATIVE 06/15/2017 0955   HGBUR MODERATE (A) 06/15/2017 0955   BILIRUBINUR NEGATIVE 06/15/2017 0955   KETONESUR NEGATIVE 06/15/2017 0955   PROTEINUR 100 (A) 06/15/2017 0955   UROBILINOGEN 0.2 12/09/2010 1117   NITRITE NEGATIVE 06/15/2017 0955   LEUKOCYTESUR LARGE (A) 06/15/2017 0955   Sepsis Labs Invalid input(s): PROCALCITONIN,  WBC,  LACTICIDVEN Microbiology Recent Results (from the past 240 hour(s))  Blood culture (routine x 2)     Status: None (Preliminary result)   Collection Time: 06/15/17  9:55 AM  Result Value Ref Range Status   Specimen Description BLOOD RIGHT HAND DRAWN BY RN  Final   Special Requests   Final    BOTTLES DRAWN AEROBIC AND ANAEROBIC Blood Culture results may not be optimal due to an inadequate volume of blood received in culture bottles   Culture   Final    NO GROWTH 2 DAYS Performed at [facility], [address], [place], [place] [address]    Report Status PENDING  Incomplete  Blood culture (routine x 2)     Status: Abnormal (Preliminary result)   Collection Time: 06/15/17  9:55 AM  Result Value Ref Range Status   Specimen Description   Final    BLOOD LEFT HAND Performed at [facility], [address] [place], [place] [address]    Special Requests   Final    BOTTLES DRAWN AEROBIC AND ANAEROBIC Blood Culture adequate volume Performed at [facility], [address], [place], [place] [address]    Culture  Setup Time   Final    GRAM NEGATIVE RODS AEROBIC BOTTLE ONLY Gram Stain Report Called to,Read Back By and Verified With: HANDY,T @ 0433 ON 3.6.19 BY BOWMAN,L CRITICAL RESULT CALLED TO, READ BACK BY AND  VERIFIED WITH: PHARMD S HALL [id number] MLM    Culture (A)  Final    ESCHERICHIA COLI SUSCEPTIBILITIES TO FOLLOW Performed at [facility], [place] [address] [place], [place] [address]    Report Status PENDING  Incomplete  Blood Culture ID Panel (Reflexed)     Status: Abnormal   Collection Time: 06/15/17  9:55 AM  Result Value Ref Range Status   Enterococcus species NOT DETECTED NOT DETECTED Final   Listeria monocytogenes NOT DETECTED NOT DETECTED Final   Staphylococcus species NOT DETECTED NOT DETECTED Final   Staphylococcus aureus NOT DETECTED NOT DETECTED Final   Streptococcus species NOT DETECTED NOT DETECTED Final   Streptococcus agalactiae NOT DETECTED NOT DETECTED Final   Streptococcus pneumoniae NOT DETECTED NOT DETECTED Final   Streptococcus pyogenes NOT DETECTED NOT DETECTED Final   Acinetobacter baumannii NOT DETECTED NOT DETECTED Final   Enterobacteriaceae species DETECTED (A) NOT DETECTED Final    Comment: Enterobacteriaceae represent a large family of gram-negative bacteria, not a single organism. CRITICAL RESULT CALLED TO, READ BACK BY AND VERIFIED WITH: PHARMD S HALL [id number] [phone] MLM    Enterobacter cloacae complex NOT DETECTED NOT DETECTED Final   Escherichia coli DETECTED (A) NOT DETECTED Final    Comment: CRITICAL RESULT CALLED TO, READ BACK BY AND VERIFIED WITH: PHARMD S HALL [id number] MLM    Klebsiella oxytoca NOT DETECTED NOT DETECTED Final   Klebsiella pneumoniae NOT DETECTED NOT DETECTED  Final   Proteus species NOT DETECTED NOT DETECTED Final   Serratia marcescens NOT DETECTED NOT DETECTED Final   Carbapenem resistance NOT DETECTED NOT DETECTED Final   Haemophilus influenzae NOT DETECTED NOT DETECTED Final   Neisseria meningitidis NOT DETECTED NOT DETECTED Final   Pseudomonas aeruginosa NOT DETECTED NOT DETECTED Final   Candida albicans NOT DETECTED NOT DETECTED Final   Candida glabrata NOT DETECTED NOT DETECTED Final   Candida krusei NOT DETECTED  NOT DETECTED Final   Candida parapsilosis NOT DETECTED NOT DETECTED Final   Candida tropicalis NOT DETECTED NOT DETECTED Final    Comment: Performed at [facility], [place] [address], [place], [place] [address]  Urine culture     Status: Abnormal (Preliminary result)   Collection Time: 06/15/17 10:01 AM  Result Value Ref Range Status   Specimen Description   Final    URINE, CATHETERIZED Performed at [facility], [address] [place], [place] [address]    Special Requests   Final    Normal Performed at [facility], [address], [place], [place] [address]    Culture >=100,000 COLONIES/mL ESCHERICHIA COLI (A)  Final   Report Status PENDING  Incomplete  Surgical PCR screen     Status: None   Collection Time: 06/15/17  2:35 PM  Result Value Ref Range Status   MRSA, PCR NEGATIVE NEGATIVE Final   Staphylococcus aureus NEGATIVE NEGATIVE Final    Comment: (NOTE) The Xpert SA Assay (FDA approved for NASAL specimens in patients 49 years of age and older), is one component of a comprehensive surveillance program. It is not intended to diagnose infection nor to guide or monitor treatment. Performed at [facility], [address] [place], [place] [address]    Time coordinating discharge: 39 mins  SIGNED:  Irwin Brakeman, MD  [facility] 06/17/2017, 11:17 AM Pager [phone]  If 7PM-7AM, please contact night-coverage [email or web address] Password [email or web address]

## 2017-06-17 NOTE — NC FL2 (Signed)
[place] LEVEL OF CARE SCREENING TOOL     IDENTIFICATION  Patient Name: Lucas [name] Birthdate: 12-31-1950 Sex: male Admission Date (Current Location): 06/15/2017  [facility] and [place] Number:  [employer] and Address:  [facility], [place] [address], [place], [place] [address]      Provider Number: [id number]  Attending Physician Name and Address:  Murlean Iba, MD  Relative Name and Phone Number:       Current Level of Care: Hospital Recommended Level of Care: [place] Prior Approval Number:    Date Approved/Denied:   PASRR Number:    Discharge Plan: SNF    Current Diagnoses: Patient Active Problem List   Diagnosis Date Noted  . E coli bacteremia 06/17/2017  . Altered mental status 06/15/2017  . Pressure injury of skin 06/15/2017  . AKI (acute kidney injury) ([place]) 06/15/2017  . CKD (chronic kidney disease) stage 4, GFR 15-29 ml/min (HCC) 06/15/2017  . History of stroke 06/15/2017  . Acute lower UTI 06/15/2017  . Dehydration 06/15/2017  . Cocaine abuse ([place])   . Noncompliance w/medication treatment due to intermit use of medication   . Middle cerebral artery stenosis, right   . Stenosis of right carotid artery   . Cerebrovascular accident (CVA) ([place])   . 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 Smoker 06/12/2014  . Memory loss or impairment 02/13/2013  . Right shoulder pain 02/21/2011  . HTN (hypertension) 01/06/2011  . Hyperlipidemia with target LDL less than 100 01/06/2011  . Polysubstance abuse ([place]) 01/06/2011  . CVA (cerebral infarction) 01/06/2011  . Preventative health care 01/06/2011    Orientation RESPIRATION BLADDER Height & Weight     Self, Time, Situation, Place  Normal Incontinent Weight: 113 lb 8.6 oz (51.5 kg) Height:  5\' 5"  (165.1 cm)  BEHAVIORAL SYMPTOMS/MOOD NEUROLOGICAL BOWEL NUTRITION STATUS      Incontinent Diet(see discharge summary)  AMBULATORY STATUS COMMUNICATION  OF NEEDS Skin   Limited Assist Verbally PU Stage and Appropriate Care([place])                       Personal Care Assistance Level of Assistance  Bathing, Feeding, Dressing Bathing Assistance: Maximum assistance Feeding assistance: Limited assistance Dressing Assistance: Maximum assistance     Functional Limitations Info  Sight, Hearing, Speech Sight Info: Impaired Hearing Info: Adequate Speech Info: Adequate    SPECIAL CARE FACTORS FREQUENCY                       Contractures Contractures Info: Not present    Additional Factors Info  Code Status, Allergies Code Status Info: Full Code Allergies Info: Pork derived products           Current Medications (06/17/2017):  This is the current hospital active medication list Current Facility-Administered Medications  Medication Dose Route Frequency Provider Last Rate Last Dose  . 0.9 %  sodium chloride infusion   Intravenous Continuous Francine Graven, DO   Stopped at 06/16/17 [phone]  . 0.9 %  sodium chloride infusion   Intravenous Continuous Kathie Dike, MD 100 mL/hr at 06/17/17 0350    . acetaminophen (TYLENOL) tablet 650 mg  650 mg Oral Q6H PRN Kathie Dike, MD       Or  . acetaminophen (TYLENOL) suppository 650 mg  650 mg Rectal Q6H PRN Kathie Dike, MD      . amLODipine (NORVASC) tablet 10 mg  10 mg Oral Daily  Kathie Dike, MD   10 mg at 06/17/17 0946  . aspirin EC tablet 325 mg  325 mg Oral Daily Kathie Dike, MD   325 mg at 06/17/17 0946  . atorvastatin (LIPITOR) tablet 40 mg  40 mg Oral q1800 Kathie Dike, MD   40 mg at 06/16/17 1707  . cefTRIAXone (ROCEPHIN) 2 g in sodium chloride 0.9 % 100 mL IVPB  2 g Intravenous Q24H Manuella Ghazi, Pratik D, DO   Stopped at 06/16/17 1737  . cloNIDine (CATAPRES) tablet 0.2 mg  0.2 mg Oral BID Kathie Dike, MD   0.2 mg at 06/17/17 0945  . clopidogrel (PLAVIX) tablet 75 mg  75 mg Oral Daily Kathie Dike, MD   75 mg at 06/17/17 0946  . enoxaparin (LOVENOX)  injection 30 mg  30 mg Subcutaneous Q24H Kathie Dike, MD   30 mg at 06/16/17 1707  . folic acid (FOLVITE) tablet 1 mg  1 mg Oral Daily Kathie Dike, MD   1 mg at 06/17/17 0946  . ondansetron (ZOFRAN) tablet 4 mg  4 mg Oral Q6H PRN Kathie Dike, MD       Or  . ondansetron (ZOFRAN) injection 4 mg  4 mg Intravenous Q6H PRN Kathie Dike, MD         Discharge Medications: Please see discharge summary for a list of discharge medications.  Relevant Imaging Results:  Relevant Lab Results:   Additional Information SSN- [id number]  Ihor Gully, LCSW

## 2017-06-17 NOTE — Progress Notes (Signed)
Patient refused lab work this am. 

## 2017-06-17 NOTE — Clinical Social Work Note (Signed)
Patient is a resident at [facility] for [place] at 06/03/17. He is a two person assist to stand and pivot.  He is incontinent of bowel and bladder. He is a one person assisit for activities of daily living.  Patient feeds himself with set up assist and makes inappropriate sexual comments to staff.  LCSW notified facility of patien's discharge. And sent clinicls.  LCSW notified patient's daughter of discharge and sent clinicals.   LCSW signing off.     [employer], Clydene Pugh, LCSW

## 2017-06-17 NOTE — Progress Notes (Signed)
Pt's IV catheter removed and intact. Pt's IV site clean dry and intact. Report called and given to carol., [facility], [place]. All questions were answered and no further questions at this time. Pt in stable condition and in no acute distress at time of discharge. Pt will be transported via [facility].

## 2017-06-18 LAB — CULTURE, BLOOD (ROUTINE X 2): Special Requests: ADEQUATE

## 2017-06-18 LAB — URINE CULTURE
Culture: >=100000 — AB
SPECIAL REQUESTS: NORMAL

## 2017-06-20 LAB — CULTURE, BLOOD (ROUTINE X 2): Culture: NO GROWTH

## 2017-06-24 ENCOUNTER — Emergency Department (HOSPITAL_COMMUNITY): Payer: Medicaid Other

## 2017-06-24 ENCOUNTER — Inpatient Hospital Stay (HOSPITAL_COMMUNITY)
Admission: EM | Admit: 2017-06-24 | Discharge: 2017-06-25 | DRG: 378 | Disposition: A | Discharge status: Skilled Nursing Facility | Payer: Medicaid Other | Attending: Internal Medicine | Admitting: Internal Medicine

## 2017-06-24 ENCOUNTER — Other Ambulatory Visit: Payer: Self-pay

## 2017-06-24 ENCOUNTER — Encounter (HOSPITAL_COMMUNITY): Payer: Self-pay | Admitting: Emergency Medicine

## 2017-06-24 DIAGNOSIS — K2980 Duodenitis without bleeding: Secondary | ICD-10-CM | POA: Diagnosis present

## 2017-06-24 DIAGNOSIS — E785 Hyperlipidemia, unspecified: Secondary | ICD-10-CM | POA: Diagnosis present

## 2017-06-24 DIAGNOSIS — Z7902 Long term (current) use of antithrombotics/antiplatelets: Secondary | ICD-10-CM | POA: Diagnosis not present

## 2017-06-24 DIAGNOSIS — F1410 Cocaine abuse, uncomplicated: Secondary | ICD-10-CM | POA: Diagnosis present

## 2017-06-24 DIAGNOSIS — Z79899 Other long term (current) drug therapy: Secondary | ICD-10-CM

## 2017-06-24 DIAGNOSIS — Z7982 Long term (current) use of aspirin: Secondary | ICD-10-CM

## 2017-06-24 DIAGNOSIS — N184 Chronic kidney disease, stage 4 (severe): Secondary | ICD-10-CM | POA: Diagnosis present

## 2017-06-24 DIAGNOSIS — Z7289 Other problems related to lifestyle: Secondary | ICD-10-CM | POA: Diagnosis not present

## 2017-06-24 DIAGNOSIS — K922 Gastrointestinal hemorrhage, unspecified: Secondary | ICD-10-CM | POA: Diagnosis present

## 2017-06-24 DIAGNOSIS — R4189 Other symptoms and signs involving cognitive functions and awareness: Secondary | ICD-10-CM | POA: Diagnosis present

## 2017-06-24 DIAGNOSIS — I5032 Chronic diastolic (congestive) heart failure: Secondary | ICD-10-CM | POA: Diagnosis present

## 2017-06-24 DIAGNOSIS — I132 Hypertensive heart and chronic kidney disease with heart failure and with stage 5 chronic kidney disease, or end stage renal disease: Secondary | ICD-10-CM | POA: Diagnosis present

## 2017-06-24 DIAGNOSIS — I10 Essential (primary) hypertension: Secondary | ICD-10-CM | POA: Diagnosis present

## 2017-06-24 DIAGNOSIS — K449 Diaphragmatic hernia without obstruction or gangrene: Secondary | ICD-10-CM | POA: Diagnosis present

## 2017-06-24 DIAGNOSIS — I69351 Hemiplegia and hemiparesis following cerebral infarction affecting right dominant side: Secondary | ICD-10-CM

## 2017-06-24 DIAGNOSIS — I639 Cerebral infarction, unspecified: Secondary | ICD-10-CM | POA: Diagnosis present

## 2017-06-24 DIAGNOSIS — G9340 Encephalopathy, unspecified: Secondary | ICD-10-CM | POA: Diagnosis present

## 2017-06-24 DIAGNOSIS — D649 Anemia, unspecified: Secondary | ICD-10-CM | POA: Diagnosis present

## 2017-06-24 DIAGNOSIS — K920 Hematemesis: Principal | ICD-10-CM | POA: Diagnosis present

## 2017-06-24 DIAGNOSIS — Z91018 Allergy to other foods: Secondary | ICD-10-CM | POA: Diagnosis not present

## 2017-06-24 DIAGNOSIS — F1090 Alcohol use, unspecified, uncomplicated: Secondary | ICD-10-CM

## 2017-06-24 DIAGNOSIS — K2970 Gastritis, unspecified, without bleeding: Secondary | ICD-10-CM | POA: Diagnosis not present

## 2017-06-24 DIAGNOSIS — Z87891 Personal history of nicotine dependence: Secondary | ICD-10-CM

## 2017-06-24 DIAGNOSIS — Z8673 Personal history of transient ischemic attack (TIA), and cerebral infarction without residual deficits: Secondary | ICD-10-CM

## 2017-06-24 HISTORY — DX: Occlusion and stenosis of unspecified anterior cerebral artery: I66.19

## 2017-06-24 HISTORY — DX: Other symptoms and signs involving cognitive functions and awareness: R41.89

## 2017-06-24 LAB — COMPREHENSIVE METABOLIC PANEL
ALBUMIN: 3.6 g/dL (ref 3.5–5.0)
ALT: 19 U/L (ref 17–63)
AST: 21 U/L (ref 15–41)
Alkaline Phosphatase: 88 U/L (ref 38–126)
Anion gap: 15 (ref 5–15)
BILIRUBIN TOTAL: 0.4 mg/dL (ref 0.3–1.2)
BUN: 45 mg/dL — AB (ref 6–20)
CHLORIDE: 105 mmol/L (ref 101–111)
CO2: 22 mmol/L (ref 22–32)
Calcium: 9.4 mg/dL (ref 8.9–10.3)
Creatinine, Ser: 2.95 mg/dL — ABNORMAL HIGH (ref 0.61–1.24)
GFR calc Af Amer: 24 mL/min — ABNORMAL LOW (ref >60)
GFR calc non Af Amer: 21 mL/min — ABNORMAL LOW (ref >60)
GLUCOSE: 108 mg/dL — AB (ref 65–99)
POTASSIUM: 4.9 mmol/L (ref 3.5–5.1)
Sodium: 142 mmol/L (ref 135–145)
Total Protein: 8.7 g/dL — ABNORMAL HIGH (ref 6.5–8.1)

## 2017-06-24 LAB — DIFFERENTIAL
Basophils Absolute: 0 10*3/uL (ref 0.0–0.1)
Basophils Relative: 0 %
Eosinophils Absolute: 0.1 10*3/uL (ref 0.0–0.7)
Eosinophils Relative: 0 %
LYMPHS PCT: 2 %
Lymphs Abs: 0.4 10*3/uL — ABNORMAL LOW (ref 0.7–4.0)
MONO ABS: 0.4 10*3/uL (ref 0.1–1.0)
MONOS PCT: 2 %
Neutro Abs: 14.2 10*3/uL — ABNORMAL HIGH (ref 1.7–7.7)
Neutrophils Relative %: 96 %

## 2017-06-24 LAB — URINALYSIS, ROUTINE W REFLEX MICROSCOPIC
Bilirubin Urine: NEGATIVE
GLUCOSE, UA: NEGATIVE mg/dL
Ketones, ur: NEGATIVE mg/dL
Leukocytes, UA: NEGATIVE
NITRITE: NEGATIVE
PH: 6 (ref 5.0–8.0)
Protein, ur: 100 mg/dL — AB
Specific Gravity, Urine: 1.011 (ref 1.005–1.030)

## 2017-06-24 LAB — CBC
HEMATOCRIT: 35.6 % — AB (ref 39.0–52.0)
Hemoglobin: 11.4 g/dL — ABNORMAL LOW (ref 13.0–17.0)
MCH: 25.3 pg — AB (ref 26.0–34.0)
MCHC: 32 g/dL (ref 30.0–36.0)
MCV: 78.9 fL (ref 78.0–100.0)
Platelets: 479 10*3/uL — ABNORMAL HIGH (ref 150–400)
RBC: 4.51 MIL/uL (ref 4.22–5.81)
RDW: 16.2 % — AB (ref 11.5–15.5)
WBC: 15.6 10*3/uL — ABNORMAL HIGH (ref 4.0–10.5)

## 2017-06-24 LAB — TYPE AND SCREEN
ABO/RH(D): O POS
Antibody Screen: NEGATIVE

## 2017-06-24 LAB — PROTIME-INR
INR: 1.12
Prothrombin Time: 14.3 seconds (ref 11.4–15.2)

## 2017-06-24 LAB — POC OCCULT BLOOD, ED: Fecal Occult Bld: POSITIVE — AB

## 2017-06-24 LAB — RAPID URINE DRUG SCREEN, HOSP PERFORMED
Amphetamines: NOT DETECTED
BARBITURATES: NOT DETECTED
BENZODIAZEPINES: NOT DETECTED
Cocaine: NOT DETECTED
Opiates: NOT DETECTED
Tetrahydrocannabinol: NOT DETECTED

## 2017-06-24 MED ORDER — CEFTRIAXONE SODIUM 1 G IJ SOLR: 1.0000 g | Freq: Once | INTRAMUSCULAR | Status: DC

## 2017-06-24 MED ORDER — PANTOPRAZOLE SODIUM 40 MG IV SOLR
80.0000 mg | Freq: Once | INTRAVENOUS | Status: AC
  Administered 2017-06-24: 80 mg via INTRAVENOUS
  Filled 2017-06-24: qty 80

## 2017-06-24 MED ORDER — AMLODIPINE BESYLATE 5 MG PO TABS
10.0000 mg | ORAL_TABLET | Freq: Every day | ORAL | Status: DC
  Administered 2017-06-25: 10 mg via ORAL
  Filled 2017-06-24: qty 2

## 2017-06-24 MED ORDER — PANTOPRAZOLE SODIUM 40 MG IV SOLR
INTRAVENOUS | Status: AC
  Filled 2017-06-24: qty 80

## 2017-06-24 MED ORDER — AZITHROMYCIN 500 MG IV SOLR: 500.0000 mg | Freq: Once | INTRAVENOUS | Status: DC

## 2017-06-24 MED ORDER — BISACODYL 10 MG RE SUPP: 10.0000 mg | Freq: Every day | RECTAL | Status: DC | PRN

## 2017-06-24 MED ORDER — VITAMIN B-1 100 MG PO TABS
100.0000 mg | ORAL_TABLET | Freq: Every day | ORAL | Status: DC
  Administered 2017-06-25: 100 mg via ORAL
  Filled 2017-06-24: qty 1

## 2017-06-24 MED ORDER — SODIUM CHLORIDE 0.9 % IV BOLUS (SEPSIS)
500.0000 mL | Freq: Once | INTRAVENOUS | Status: AC
  Administered 2017-06-24: 500 mL via INTRAVENOUS

## 2017-06-24 MED ORDER — CLONIDINE HCL 0.2 MG PO TABS
0.2000 mg | ORAL_TABLET | Freq: Two times a day (BID) | ORAL | Status: DC
  Administered 2017-06-25: 0.2 mg via ORAL
  Filled 2017-06-24: qty 1

## 2017-06-24 MED ORDER — KCL IN DEXTROSE-NACL 20-5-0.45 MEQ/L-%-% IV SOLN
INTRAVENOUS | Status: DC
  Administered 2017-06-24: 19:00:00 via INTRAVENOUS

## 2017-06-24 MED ORDER — ATORVASTATIN CALCIUM 40 MG PO TABS
40.0000 mg | ORAL_TABLET | Freq: Every day | ORAL | Status: DC
  Administered 2017-06-25: 40 mg via ORAL
  Filled 2017-06-24 (×2): qty 1

## 2017-06-24 MED ORDER — PANTOPRAZOLE SODIUM 40 MG IV SOLR
40.0000 mg | Freq: Once | INTRAVENOUS | Status: AC
  Administered 2017-06-24: 40 mg via INTRAVENOUS
  Filled 2017-06-24: qty 40

## 2017-06-24 MED ORDER — HYDRALAZINE HCL 20 MG/ML IJ SOLN
10.0000 mg | Freq: Four times a day (QID) | INTRAMUSCULAR | Status: DC | PRN
  Administered 2017-06-24 – 2017-06-25 (×2): 10 mg via INTRAVENOUS
  Filled 2017-06-24 (×2): qty 1

## 2017-06-24 MED ORDER — PANTOPRAZOLE SODIUM 40 MG IV SOLR: 40.0000 mg | Freq: Two times a day (BID) | INTRAVENOUS | Status: DC

## 2017-06-24 MED ORDER — SODIUM CHLORIDE 0.9 % IV SOLN
8.0000 mg/h | INTRAVENOUS | Status: DC
  Administered 2017-06-24 – 2017-06-25 (×2): 8 mg/h via INTRAVENOUS
  Filled 2017-06-24 (×8): qty 80

## 2017-06-24 MED ORDER — FOLIC ACID 1 MG PO TABS
1.0000 mg | ORAL_TABLET | Freq: Every day | ORAL | Status: DC
  Administered 2017-06-25: 1 mg via ORAL
  Filled 2017-06-24: qty 1

## 2017-06-24 NOTE — ED Provider Notes (Signed)
[facility] EMERGENCY DEPARTMENT Provider Note   CSN: [id number] Arrival date & time: 06/24/17  0177     History   Chief Complaint Chief Complaint  Patient presents with  . Hematemesis    HPI Lucas [name] is a 67 y.o. male.  Patient states he started up about 4 or 5 times coffee-ground material   The history is provided by the patient. No language interpreter was used.  Illness  This is a new problem. The current episode started 6 to 12 hours ago. The problem occurs constantly. The problem has not changed since onset.Pertinent negatives include no chest pain, no abdominal pain and no headaches. Nothing aggravates the symptoms. He has tried nothing for the symptoms.    Past Medical History:  Diagnosis Date  . Carotid artery stenosis   . Chronic diastolic heart failure ([place])   . Chronic kidney disease, stage IV (severe) ([place])   . Cognitive deficits   . CVA (cerebral infarction) 11/2010   CT 8/12 : Sm ischemic infarct posterior limb of L internal capsule. Carotids nl. ECHO nl with EF 55-60%. R sided weakness.   . Hyperlipemia   . Hyperlipidemia 01/06/2011   Diagnosed during acute CVA hospitalization. On statin.  Marland Kitchen Hypertension 11/2010   Diagnosed during acute CVA hospitalization  . Occlusion of anterior cerebral artery   . Polysubstance abuse ([place]) 01/06/2011   cocaine, tobacco, THC    Patient Active Problem List   Diagnosis Date Noted  . E coli bacteremia 06/17/2017  . Altered mental status 06/15/2017  . Pressure injury of skin 06/15/2017  . AKI (acute kidney injury) ([place]) 06/15/2017  . CKD (chronic kidney disease) stage 4, GFR 15-29 ml/min (HCC) 06/15/2017  . History of stroke 06/15/2017  . Acute lower UTI 06/15/2017  . Dehydration 06/15/2017  . Cocaine abuse ([place])   . Noncompliance w/medication treatment due to intermit use of medication   . Middle cerebral artery stenosis, right   . Stenosis of right carotid artery   . Cerebrovascular accident (CVA) ([place])     . 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 Smoker 06/12/2014  . Memory loss or impairment 02/13/2013  . Right shoulder pain 02/21/2011  . HTN (hypertension) 01/06/2011  . Hyperlipidemia with target LDL less than 100 01/06/2011  . Polysubstance abuse ([place]) 01/06/2011  . CVA (cerebral infarction) 01/06/2011  . Preventative health care 01/06/2011    History reviewed. No pertinent surgical history.     Home Medications    Prior to Admission medications   Medication Sig Start Date End Date Taking? Authorizing Provider  amLODipine (NORVASC) 10 MG tablet Take 1 tablet (10 mg total) by mouth daily. 06/12/14  Yes Funches, Adriana Mccallum, MD  aspirin EC 325 MG EC tablet Take 1 tablet (325 mg total) by mouth daily. 06/03/17  Yes Patrecia Pour, Christean Grief, MD  atorvastatin (LIPITOR) 40 MG tablet Take 1 tablet (40 mg total) by mouth daily at 6 PM. 06/02/17  Yes Patrecia Pour, Christean Grief, MD  cefUROXime (CEFTIN) 250 MG tablet Take 1 tablet (250 mg total) by mouth 2 (two) times daily with a meal for 13 days. 06/17/17 06/30/17 Yes Johnson, Clanford L, MD  clopidogrel (PLAVIX) 75 MG tablet Take 1 tablet (75 mg total) by mouth daily. 06/03/17  Yes Patrecia Pour, Christean Grief, MD  folic acid (FOLVITE) 1 MG tablet Take 1 tablet (1 mg total) by mouth daily. 06/03/17  Yes Patrecia Pour, Christean Grief, MD  lisinopril (PRINIVIL,ZESTRIL) 20 MG tablet Take 20 mg by mouth daily.   Yes [provider]  cloNIDine (CATAPRES) 0.2 MG tablet Take 1 tablet (0.2 mg total) by mouth 2 (two) times daily. Patient not taking: Reported on 06/24/2017 06/02/17   Doreatha Lew, MD    Family History Family History  Problem Relation Age of Onset  . Hypertension Mother   . Hypertension Father     Social History Social History   Tobacco Use  . Smoking status: Former Smoker    Packs/day: 0.20    Years: 40.00    Pack years: 8.00    Types: Cigarettes    Start date: 01/02/1965  . Smokeless tobacco: Never Used  . Tobacco comment: 2-3 per week  Substance  Use Topics  . Alcohol use: Yes    Alcohol/week: 1.2 oz    Types: 2 Standard drinks or equivalent per week    Comment: beer  . Drug use: No     Allergies   Pork-derived products   Review of Systems Review of Systems  Constitutional: Negative for appetite change and fatigue.  HENT: Negative for congestion, ear discharge and sinus pressure.   Eyes: Negative for discharge.  Respiratory: Negative for cough.   Cardiovascular: Negative for chest pain.  Gastrointestinal: Negative for abdominal pain and diarrhea.       Vomiting coffee-ground material  Genitourinary: Negative for frequency and hematuria.  Musculoskeletal: Negative for back pain.  Skin: Negative for rash.  Neurological: Negative for seizures and headaches.  Psychiatric/Behavioral: Negative for hallucinations.     Physical Exam Updated Vital Signs BP (!) 185/73   Pulse 77   Temp 99.1 F (37.3 C) (Oral)   Resp 17   Ht 5\' 5"  (1.651 m)   Wt 51.3 kg (113 lb)   SpO2 100%   BMI 18.80 kg/m   Physical Exam  Constitutional: He is oriented to person, place, and time. He appears well-developed.  HENT:  Head: Normocephalic.  Eyes: Conjunctivae and EOM are normal. No scleral icterus.  Neck: Neck supple. No thyromegaly present.  Cardiovascular: Normal rate and regular rhythm. Exam reveals no gallop and no friction rub.  No murmur heard. Pulmonary/Chest: No stridor. He has no wheezes. He has no rales. He exhibits no tenderness.  Abdominal: He exhibits no distension. There is no tenderness. There is no rebound.  Genitourinary:  Genitourinary Comments: Rectal exam heme positive [place] stool  Musculoskeletal: Normal range of motion. He exhibits no edema.  Lymphadenopathy:    He has no cervical adenopathy.  Neurological: He is oriented to person, place, and time. He exhibits normal muscle tone. Coordination normal.  Skin: No rash noted. No erythema.  Psychiatric: He has a normal mood and affect. His behavior is normal.      ED Treatments / Results  Labs (all labs ordered are listed, but only abnormal results are displayed) Labs Reviewed  COMPREHENSIVE METABOLIC PANEL - Abnormal; Notable for the following components:      Result Value   Glucose, Bld 108 (*)    BUN 45 (*)    Creatinine, Ser 2.95 (*)    Total Protein 8.7 (*)    GFR calc non Af Amer 21 (*)    GFR calc Af Amer 24 (*)    All other components within normal limits  CBC - Abnormal; Notable for the following components:   WBC 15.6 (*)    Hemoglobin 11.4 (*)    HCT 35.6 (*)    MCH 25.3 (*)    RDW 16.2 (*)    Platelets 479 (*)    All  other components within normal limits  DIFFERENTIAL - Abnormal; Notable for the following components:   Neutro Abs 14.2 (*)    Lymphs Abs 0.4 (*)    All other components within normal limits  POC OCCULT BLOOD, ED - Abnormal; Notable for the following components:   Fecal Occult Bld POSITIVE (*)    All other components within normal limits  PROTIME-INR  TYPE AND SCREEN    EKG  EKG Interpretation None       Radiology Dg Chest 2 View  Result Date: 06/24/2017 CLINICAL DATA:  Vomiting, coffee-ground appearance EXAM: CHEST - 2 VIEW COMPARISON:  06/15/2017 FINDINGS: Chronic cardiomegaly. Aortic atherosclerosis. Stable aortic and hilar contours. There is no edema, consolidation, effusion, or pneumothorax. No visible pneumoperitoneum. IMPRESSION: 1. No evidence of active cardiopulmonary disease. 2. Chronic cardiomegaly. Electronically Signed   By: Monte Fantasia M.D.   On: 06/24/2017 12:01    Procedures Procedures (including critical care time)  Medications Ordered in ED Medications  pantoprazole (PROTONIX) injection 40 mg (40 mg Intravenous Given 06/24/17 1114)  sodium chloride 0.9 % bolus 500 mL (500 mLs Intravenous New Bag/Given 06/24/17 1114)     Initial Impression / Assessment and Plan / ED Course  I have reviewed the triage vital signs and the nursing notes. CRITICAL CARE Performed by:  Milton Ferguson Total critical care time: 40 minutes Critical care time was exclusive of separately billable procedures and treating other patients. Critical care was necessary to treat or prevent imminent or life-threatening deterioration. Critical care was time spent personally by me on the following activities: development of treatment plan with patient and/or surrogate as well as nursing, discussions with consultants, evaluation of patient's response to treatment, examination of patient, obtaining history from patient or surrogate, ordering and performing treatments and interventions, ordering and review of laboratory studies, ordering and review of radiographic studies, pulse oximetry and re-evaluation of patient's condition.  Pertinent labs & imaging results that were available during my care of the patient were reviewed by me and considered in my medical decision making (see chart for details).   Pt with upper GI bleed.  He is hemodynamically stable.  GI has been consulted and they agree with admission and possibly scoping tomorrow morning.  Medicine will admit    Final Clinical Impressions(s) / ED Diagnoses   Final diagnoses:  Upper GI bleed    ED Discharge Orders    None       Milton Ferguson, MD 06/24/17 1417

## 2017-06-24 NOTE — Progress Notes (Signed)
Pt continues to make sexual advances and expresses himself using profanities to RN and Chartered certified accountant. Pt redirected to answer questions in an appropriate manner but patient noncompliant.

## 2017-06-24 NOTE — ED Notes (Signed)
Patient stated he does not stand.

## 2017-06-24 NOTE — ED Triage Notes (Signed)
Pt sent from [facility] for evaluation of vomiting x3 this morning after eating with coffee ground appearance.  Pt denies complaints at this time.

## 2017-06-24 NOTE — ED Notes (Signed)
Pt refuses to have blood drawn

## 2017-06-24 NOTE — Consult Note (Signed)
Referring Provider: ED Physician Primary Care Physician:  [name], MD Primary Gastroenterologist:  Dr. Gala Romney (previously unassigned)  Date of Admission: 06/24/17 Date of Consultation: 06/24/17  Reason for Consultation:  Coffee ground emesis, heme+ stool  HPI:  Lucas [name] is a 67 y.o. male with a past medical history of chronic kidney disease stage IV, chronic diastolic heart failure, CVA, polysubstance abuse presented on referral from his nursing facility [facility]) with complaints of postprandial vomiting with coffee-ground emesis that morning.  He does not appear to have been previously evaluated by GI in our system.  No colonoscopies or endoscopies in our system.  No provider note available at time of initial consult.  Labs drawn in the emergency department show a CMP with elevated creatinine 2.95 which is near baseline for his end-stage kidney disease.  His CBC shows mildly anemic with hemoglobin of 11.4 which is hypochromic.  Ironically, this is improved from his hemoglobin of 8.4 approximately 1 week ago.  Query element of hemoconcentration.  His INR was normal.  Hemoccult blood positive.  Today he states he began vomiting blood yesterday but has not had any further hematemesis since then.  He currently denies abdominal pain, nausea, vomiting, GERD symptoms.  Denies any other NSAIDs other than daily aspirin.  He is also on Plavix.  These are both likely to be held by hospitalist.  He denies chest pain, shortness of breath, dizziness, syncope, near syncope.  He denies overt weakness or fatigue.  He states he drinks about 2 beers every other day.  He has done so for some time.  History of polysubstance abuse.  States he last used cocaine 1 week ago.  Denies any other overt GI symptoms.  Past Medical History:  Diagnosis Date  . Carotid artery stenosis   . Chronic diastolic heart failure ([place])   . Chronic kidney disease, stage IV (severe) ([place])   . Cognitive deficits   .  CVA (cerebral infarction) 11/2010   CT 8/12 : Sm ischemic infarct posterior limb of L internal capsule. Carotids nl. ECHO nl with EF 55-60%. R sided weakness.   . Hyperlipemia   . Hyperlipidemia 01/06/2011   Diagnosed during acute CVA hospitalization. On statin.  Marland Kitchen Hypertension 11/2010   Diagnosed during acute CVA hospitalization  . Occlusion of anterior cerebral artery   . Polysubstance abuse ([place]) 01/06/2011   cocaine, tobacco, THC    History reviewed. No pertinent surgical history.  Prior to Admission medications   Medication Sig Start Date End Date Taking? Authorizing Provider  amLODipine (NORVASC) 10 MG tablet Take 1 tablet (10 mg total) by mouth daily. 06/12/14   Boykin Nearing, MD  aspirin EC 325 MG EC tablet Take 1 tablet (325 mg total) by mouth daily. 06/03/17   Doreatha Lew, MD  atorvastatin (LIPITOR) 40 MG tablet Take 1 tablet (40 mg total) by mouth daily at 6 PM. 06/02/17   Patrecia Pour, Christean Grief, MD  cefUROXime (CEFTIN) 250 MG tablet Take 1 tablet (250 mg total) by mouth 2 (two) times daily with a meal for 13 days. 06/17/17 06/30/17  Johnson, Clanford L, MD  cloNIDine (CATAPRES) 0.2 MG tablet Take 1 tablet (0.2 mg total) by mouth 2 (two) times daily. 06/02/17   Doreatha Lew, MD  clopidogrel (PLAVIX) 75 MG tablet Take 1 tablet (75 mg total) by mouth daily. 06/03/17   Doreatha Lew, MD  folic acid (FOLVITE) 1 MG tablet Take 1 tablet (1 mg total) by mouth daily. 06/03/17  Patrecia Pour, Christean Grief, MD    No current facility-administered medications for this encounter.    Current Outpatient Medications  Medication Sig Dispense Refill  . amLODipine (NORVASC) 10 MG tablet Take 1 tablet (10 mg total) by mouth daily. 90 tablet 3  . aspirin EC 325 MG EC tablet Take 1 tablet (325 mg total) by mouth daily. 30 tablet 0  . atorvastatin (LIPITOR) 40 MG tablet Take 1 tablet (40 mg total) by mouth daily at 6 PM.    . cefUROXime (CEFTIN) 250 MG tablet Take 1 tablet (250 mg total) by mouth  2 (two) times daily with a meal for 13 days. 26 tablet 0  . cloNIDine (CATAPRES) 0.2 MG tablet Take 1 tablet (0.2 mg total) by mouth 2 (two) times daily.    . clopidogrel (PLAVIX) 75 MG tablet Take 1 tablet (75 mg total) by mouth daily.    . folic acid (FOLVITE) 1 MG tablet Take 1 tablet (1 mg total) by mouth daily.      Allergies as of 06/24/2017 - Review Complete 06/24/2017  Allergen Reaction Noted  . Pork-derived products Other (See Comments) 02/20/2011    Family History  Problem Relation Age of Onset  . Hypertension Mother   . Hypertension Father     Social History   Socioeconomic History  . Marital status: Single    Spouse name: Not on file  . Number of children: Not on file  . Years of education: Not on file  . Highest education level: Not on file  Social Needs  . Financial resource strain: Not on file  . Food insecurity - worry: Not on file  . Food insecurity - inability: Not on file  . Transportation needs - medical: Not on file  . Transportation needs - non-medical: Not on file  Occupational History  . Not on file  Tobacco Use  . Smoking status: Former Smoker    Packs/day: 0.20    Years: 40.00    Pack years: 8.00    Types: Cigarettes    Start date: 01/02/1965  . Smokeless tobacco: Never Used  . Tobacco comment: 2-3 per week  Substance and Sexual Activity  . Alcohol use: Yes    Alcohol/week: 1.2 oz    Types: 2 Standard drinks or equivalent per week    Comment: beer  . Drug use: No  . Sexual activity: No  Other Topics Concern  . Not on file  Social History Narrative  . Not on file    Review of Systems: General: Negative for anorexia, weight loss, fever, chills, fatigue, weakness. ENT: Negative for hoarseness, difficulty swallowing. CV: Negative for chest pain, angina, palpitations, peripheral edema.  Respiratory: Negative for dyspnea at rest, cough, sputum, wheezing.  GI: See history of present illness. Derm: Negative for rash or itching.  Endo:  Negative for unusual weight change.  Heme: Negative for bruising or bleeding. Allergy: Negative for rash or hives.  Physical Exam: Vital signs in last 24 hours: Temp:  [99.1 F (37.3 C)] 99.1 F (37.3 C) (03/14 0910) Pulse Rate:  [69-80] 77 (03/14 1130) Resp:  [16-22] 17 (03/14 1300) BP: (168-194)/(65-77) 185/73 (03/14 1300) SpO2:  [98 %-100 %] 100 % (03/14 1130) Weight:  [113 lb (51.3 kg)] 113 lb (51.3 kg) (03/14 0908)   General:   Alert,  Well-developed, well-nourished, pleasant and cooperative in NAD Head:  Normocephalic and atraumatic. Eyes:  Sclera clear, no icterus. Conjunctiva pink. Ears:  Normal auditory acuity. Nose:  No deformity, discharge, or lesions.  Lungs:  Clear throughout to auscultation. No wheezes, crackles, or rhonchi. No acute distress. Heart:  Regular rate and rhythm; no murmurs, clicks, rubs, or gallops. Abdomen:  Soft, and nondistended. Admits mild generalized TTP. No masses, hepatosplenomegaly or hernias noted. Normal bowel sounds, without guarding, and without rebound.   Rectal:  Deferred.   Msk:  Symmetrical without gross deformities. Pulses:  Normal pulses noted. Extremities:  Without clubbing or edema. Neurologic:  Alert and  oriented x4;  grossly normal neurologically. Psych:  Alert and cooperative. Normal mood and affect.  Intake/Output from previous day: No intake/output data recorded. Intake/Output this shift: No intake/output data recorded.  Lab Results: Recent Labs    06/24/17 1017  WBC 15.6*  HGB 11.4*  HCT 35.6*  PLT 479*   BMET Recent Labs    06/24/17 1017  NA 142  K 4.9  CL 105  CO2 22  GLUCOSE 108*  BUN 45*  CREATININE 2.95*  CALCIUM 9.4   LFT Recent Labs    06/24/17 1017  PROT 8.7*  ALBUMIN 3.6  AST 21  ALT 19  ALKPHOS 88  BILITOT 0.4   PT/INR Recent Labs    06/24/17 1017  LABPROT 14.3  INR 1.12   Hepatitis Panel No results for input(s): HEPBSAG, HCVAB, HEPAIGM, HEPBIGM in the last 72  hours. C-Diff No results for input(s): CDIFFTOX in the last 72 hours.  Studies/Results: Dg Chest 2 View  Result Date: 06/24/2017 CLINICAL DATA:  Vomiting, coffee-ground appearance EXAM: CHEST - 2 VIEW COMPARISON:  06/15/2017 FINDINGS: Chronic cardiomegaly. Aortic atherosclerosis. Stable aortic and hilar contours. There is no edema, consolidation, effusion, or pneumothorax. No visible pneumoperitoneum. IMPRESSION: 1. No evidence of active cardiopulmonary disease. 2. Chronic cardiomegaly. Electronically Signed   By: Monte Fantasia M.D.   On: 06/24/2017 12:01    Impression: 67 year old male presents from the nursing home with complaints of hematemesis.  He denies any further vomiting since yesterday.  He does admit he was vomiting blood.  He denied abdominal pain initially but then was mildly tender generalized to his abdomen.  He denies overt heartburn symptoms.  I question the veracity and accuracy of his history given that he is at a nursing home but states he continues to drink 2 beers every other day and last used cocaine 1 week ago.  However, to ensure safety during endoscopic evaluation we will check a urine drug screen today.  Given his admission of chronic alcohol use, I checked for recent abdominal imaging.  He had a CT abdomen last 10 years ago which indicated fatty infiltration of the liver.  I query some potential risk of fibrosis/cirrhosis.  Given no ongoing bleeding, no bright red blood I tend to doubt varices.  However, we will follow-up on this while he is inpatient. Vitals are stable currently.  Overall, given his presentation and history, as limited as it is, differentials include gastritis, esophagitis, peptic ulcer disease, duodenitis, duodenal ulcer disease, less likely varices.  All these are potentially comp located by daily aspirin.  We will plan for further evaluation endoscopically.  Proceed with EGD on propofol/MAC with Dr. Oneida Alar tomorrow: the risks, benefits, and  alternatives have been discussed with the patient in detail. The patient states understanding and desires to proceed.  The patient has a history of polysubstance abuse.  States he currently continues to use alcohol regularly.  No other anticoagulants, anxiolytics, chronic pain medications, antidepressants.  We will plan for the procedure on propofol/MAC to promote adequate sedation.  Plan: 1. UDS  today 2. NPO after midnight 3. PPI drip for now, can downgrade pending EGD results 4. EGD tomorrow on propofol/MAC 5. Monitor Hgb closely (will plan to recheck this evening and tomorrow morning) 6. Abdominal U/S for liver contour and any consistencies with portal hypertension 7. Monitor for any further GI bleed 8. Supportive measures   Thank you for allowing [place] to participate in the care of Anselm Pancoast, DNP, AGNP-C Adult & Gerontological Nurse Practitioner [facility]    LOS: 0 days     06/24/2017, 1:41 PM

## 2017-06-24 NOTE — H&P (Signed)
TRH H&P   Patient Demographics:    Lucas [name], is a 68 y.o. male  MRN: [id number]   DOB - Dec 24, 1950  Admit Date - 06/24/2017  Outpatient Primary MD for the patient is [name], MD    Patient coming from: SNF  Chief Complaint  Patient presents with  . Hematemesis      HPI:    Lucas [name]  is a 67 y.o. male, history of recent stroke causing right-sided hemiparesis, CKD stage IV, hypertension, bradycardia, dyslipidemia, history of cocaine and alcohol abuse, living at SNF for a month, poor historian, most of the history obtained by phone conversation with his SNF nurse, over the phone from his sister and daughter.  Patient is in the ER, he is mildly confused and providing somewhat of a bizarre history, he thinks he still at [facility] at times he says he is in [place], he says he threw up some blood yesterday afternoon, he also claims that he is regularly doing cocaine and alcohol at the SNF.  He does not know why he was sent here but thinks it was because he threw up some blood yesterday.  He also told 1 of the ER nurses that he would like to sleep with her.  I subsequently called the nursing home with the help of his nurse and talk to the nursing home nurse myself, apparently they are not sure that patient is doing any cocaine or alcohol, he does have a few visitors from time to time, they claim that his hematemesis was actually this afternoon and not yesterday, his daughter and sister further say that when he was living with them 1 month ago he was regularly drinking alcohol and infrequently doing cocaine, they did say that he has a foul mouth and tends to say things which are not true.  This was  also told by the nursing home nurse.  At any rate patient was brought in from nursing home for hematemesis which now appears to have happened today rather than yesterday, his H&H was stable and he was hemodynamically stable, GI was consulted and I was called to admit the patient.  Patient himself is in no distress he is sleeping, in between he opens his eyes and answers questions.  I do not think he is a reliable historian.    Review  of systems:    In addition to the HPI above, currently negative review of systems except some right-sided weakness which is chronic, No Fever-chills, No Headache, No changes with Vision or hearing, No problems swallowing food or Liquids, No Chest pain, Cough or Shortness of Breath, No Abdominal pain, No Nausea or Vommitting, Bowel movements are regular, No Blood in stool or Urine, No dysuria, No new skin rashes or bruises, No new joints pains-aches,  No new weakness, tingling, numbness in any extremity, No recent weight gain or loss, No polyuria, polydypsia or polyphagia, No significant Mental Stressors.  A full 10 point Review of Systems was done, except as stated above, all other Review of Systems were negative.   With Past History of the following :    Past Medical History:  Diagnosis Date  . Carotid artery stenosis   . Chronic diastolic heart failure ([place])   . Chronic kidney disease, stage IV (severe) ([place])   . Cognitive deficits   . CVA (cerebral infarction) 11/2010   CT 8/12 : Sm ischemic infarct posterior limb of L internal capsule. Carotids nl. ECHO nl with EF 55-60%. R sided weakness.   . Hyperlipemia   . Hyperlipidemia 01/06/2011   Diagnosed during acute CVA hospitalization. On statin.  Marland Kitchen Hypertension 11/2010   Diagnosed during acute CVA hospitalization  . Occlusion of anterior cerebral artery   . Polysubstance abuse ([place]) 01/06/2011   cocaine, tobacco, THC      History reviewed. No pertinent surgical history.    Social History:      Social History   Tobacco Use  . Smoking status: Former Smoker    Packs/day: 0.20    Years: 40.00    Pack years: 8.00    Types: Cigarettes    Start date: 01/02/1965  . Smokeless tobacco: Never Used  . Tobacco comment: 2-3 per week  Substance Use Topics  . Alcohol use: Yes    Alcohol/week: 1.2 oz    Types: 2 Standard drinks or equivalent per week    Comment: beer        Family History :     Family History  Problem Relation Age of Onset  . Hypertension Mother   . Hypertension Father        Home Medications:   Prior to Admission medications   Medication Sig Start Date End Date Taking? Authorizing Provider  amLODipine (NORVASC) 10 MG tablet Take 1 tablet (10 mg total) by mouth daily. 06/12/14  Yes Funches, Adriana Mccallum, MD  aspirin EC 325 MG EC tablet Take 1 tablet (325 mg total) by mouth daily. 06/03/17  Yes Patrecia Pour, Christean Grief, MD  atorvastatin (LIPITOR) 40 MG tablet Take 1 tablet (40 mg total) by mouth daily at 6 PM. 06/02/17  Yes Patrecia Pour, Christean Grief, MD  cefUROXime (CEFTIN) 250 MG tablet Take 1 tablet (250 mg total) by mouth 2 (two) times daily with a meal for 13 days. 06/17/17 06/30/17 Yes Johnson, Clanford L, MD  clopidogrel (PLAVIX) 75 MG tablet Take 1 tablet (75 mg total) by mouth daily. 06/03/17  Yes Patrecia Pour, Christean Grief, MD  folic acid (FOLVITE) 1 MG tablet Take 1 tablet (1 mg total) by mouth daily. 06/03/17  Yes Patrecia Pour, Christean Grief, MD  lisinopril (PRINIVIL,ZESTRIL) 20 MG tablet Take 20 mg by mouth daily.   Yes [provider]  cloNIDine (CATAPRES) 0.2 MG tablet Take 1 tablet (0.2 mg total) by mouth 2 (two) times daily. Patient not taking: Reported on 06/24/2017 06/02/17   Patrecia Pour, Christean Grief,  MD     Allergies:     Allergies  Allergen Reactions  . Pork-Derived Products Other (See Comments)    Patient does not eat pork products.     Physical Exam:   Vitals  Blood pressure (!) 185/73, pulse 77, temperature 99.1 F (37.3 C), temperature source Oral, resp.  rate 17, height 5\' 5"  (1.651 m), weight 51.3 kg (113 lb), SpO2 100 %.   1. General elderly African-American male who appears much older than his stated age lying in hospital bed in no apparent discomfort,  2.  Sleeping eyes closed but easily arousable, oriented x2,   3. No F.N deficits, ALL C.Nerves Intact, right side weaker than the left.  4. Ears and Eyes appear Normal, Conjunctivae clear, PERRLA. Moist Oral Mucosa.  5. Supple Neck, No JVD, No cervical lymphadenopathy appriciated, No Carotid Bruits.  6. Symmetrical Chest wall movement, Good air movement bilaterally, CTAB.  7. RRR, No Gallops, Rubs or Murmurs, No Parasternal Heave.  8. Positive Bowel Sounds, Abdomen Soft, No tenderness, No organomegaly appriciated,No rebound -guarding or rigidity.  9.  No Cyanosis, Normal Skin Turgor, No Skin Rash or Bruise.  10. Good muscle tone,  joints appear normal , no effusions, Normal ROM.  11. No Palpable Lymph Nodes in Neck or Axillae      Data Review:    CBC Recent Labs  Lab 06/24/17 1017  WBC 15.6*  HGB 11.4*  HCT 35.6*  PLT 479*  MCV 78.9  MCH 25.3*  MCHC 32.0  RDW 16.2*  LYMPHSABS 0.4*  MONOABS 0.4  EOSABS 0.1  BASOSABS 0.0   ------------------------------------------------------------------------------------------------------------------  Chemistries  Recent Labs  Lab 06/24/17 1017  NA 142  K 4.9  CL 105  CO2 22  GLUCOSE 108*  BUN 45*  CREATININE 2.95*  CALCIUM 9.4  AST 21  ALT 19  ALKPHOS 88  BILITOT 0.4   ------------------------------------------------------------------------------------------------------------------ estimated creatinine clearance is 18.4 mL/min (A) (by C-G formula based on SCr of 2.95 mg/dL (H)). ------------------------------------------------------------------------------------------------------------------ No results for input(s): TSH, T4TOTAL, T3FREE, THYROIDAB in the last 72 hours.  Invalid input(s): FREET3  Coagulation  profile Recent Labs  Lab 06/24/17 1017  INR 1.12   ------------------------------------------------------------------------------------------------------------------- No results for input(s): DDIMER in the last 72 hours. -------------------------------------------------------------------------------------------------------------------  Cardiac Enzymes No results for input(s): CKMB, TROPONINI, MYOGLOBIN in the last 168 hours.  Invalid input(s): CK ------------------------------------------------------------------------------------------------------------------    Component Value Date/Time   BNP 166.0 (H) 06/15/2017 0955     ---------------------------------------------------------------------------------------------------------------  Urinalysis    Component Value Date/Time   COLORURINE YELLOW 06/15/2017 0955   APPEARANCEUR CLOUDY (A) 06/15/2017 0955   LABSPEC 1.012 06/15/2017 0955   PHURINE 5.0 06/15/2017 0955   GLUCOSEU NEGATIVE 06/15/2017 0955   HGBUR MODERATE (A) 06/15/2017 0955   BILIRUBINUR NEGATIVE 06/15/2017 0955   KETONESUR NEGATIVE 06/15/2017 0955   PROTEINUR 100 (A) 06/15/2017 0955   UROBILINOGEN 0.2 12/09/2010 1117   NITRITE NEGATIVE 06/15/2017 0955   LEUKOCYTESUR LARGE (A) 06/15/2017 0955    ----------------------------------------------------------------------------------------------------------------   Imaging Results:    Dg Chest 2 View  Result Date: 06/24/2017 CLINICAL DATA:  Vomiting, coffee-ground appearance EXAM: CHEST - 2 VIEW COMPARISON:  06/15/2017 FINDINGS: Chronic cardiomegaly. Aortic atherosclerosis. Stable aortic and hilar contours. There is no edema, consolidation, effusion, or pneumothorax. No visible pneumoperitoneum. IMPRESSION: 1. No evidence of active cardiopulmonary disease. 2. Chronic cardiomegaly. Electronically Signed   By: Monte Fantasia M.D.   On: 06/24/2017 12:01    My personal review of EKG: Rhythm NSR, with LVH  and LVH related  changes   Assessment & Plan:     1.  Hematemesis due to acute upper GI bleed.  Currently hemodynamically stable, H&H stable, hold aspirin Plavix, type screen, cycle H&H, IV PPI, GI on call has been consulted likely EGD tomorrow.  NPO except medications for now.  Gentle IV fluids.  2.  Recent history of stroke, right-sided hemiparesis.  Unfortunately at this time aspirin and Plavix have to be held due to #1 above, continue statin for secondary prevention, risks and benefits have been explained to the patient, daughter and sister.  He is at high risk for getting another stroke in the meantime.  Once GI issues have been resolved we will resume antiplatelet medications as safe.  3.  History of cocaine use, alcohol use.  Counseled to quit all, unclear what he is currently using, he claims he is doing both on a regular basis at SNF but I highly doubted, I have discussed his case with nursing home nurse, daughter and sister all claims that he tends to say things which are not true but the daughter suspects that he might have gotten some alcohol or drugs through 1 of his nephews.  Urine drug screen and serum alcohol levels will be obtained.  Currently no signs of DTs but will monitor closely.  Continue thiamine and folic acid for now.  Continue to avoid beta-blocker.  4.  CKD 5.  Creatinine appears to be close to baseline.  5.  Hypertension.  Continue home dose Norvasc, clonidine along with as needed hydralazine.  Hold ACE/ARB for now.  6.  Encephalopathy.  Likely due to combination of #1, 2 and 3.  Supportive care.  7.  Dyslipidemia.  On statin continue.  8.  Leukocytosis. Likely reactionary from hematemesis, chest x-ray is clear, check UA.     DVT Prophylaxis  SCDs    AM Labs Ordered, also please review Full Orders  Family Communication: Admission, patients condition and plan of care including tests being ordered have been discussed with the patient and daughter and sister who indicate  understanding and agree with the plan and Code Status.  Code Status Full  Likely DC to  SNF  Condition Fair  Consults called: GI    Admission status: Inpt  Time spent in minutes : 35   Lala Lund M.D on 06/24/2017 at 2:39 PM  Between 7am to 7pm - Pager - [phone] ( page via [facility], text pages only, please mention full 10 digit call back number).  After 7pm go to [email or web address] - password [facility]  [facility] - Office  [phone]

## 2017-06-24 NOTE — ED Notes (Signed)
Pt refused to have second IV placed

## 2017-06-24 NOTE — Progress Notes (Signed)
Per lab tech, patient refusing to have labs drawn.

## 2017-06-24 NOTE — ED Notes (Signed)
Pt refused to have ultrasound done at this time

## 2017-06-25 ENCOUNTER — Inpatient Hospital Stay (HOSPITAL_COMMUNITY): Payer: Medicaid Other | Admitting: Anesthesiology

## 2017-06-25 ENCOUNTER — Encounter (HOSPITAL_COMMUNITY): Payer: Self-pay | Admitting: Gastroenterology

## 2017-06-25 ENCOUNTER — Encounter (HOSPITAL_COMMUNITY)
Admission: EM | Disposition: A | Discharge status: Skilled Nursing Facility | Payer: Self-pay | Source: Home / Self Care | Attending: Internal Medicine

## 2017-06-25 DIAGNOSIS — N184 Chronic kidney disease, stage 4 (severe): Secondary | ICD-10-CM

## 2017-06-25 DIAGNOSIS — K2970 Gastritis, unspecified, without bleeding: Secondary | ICD-10-CM

## 2017-06-25 DIAGNOSIS — F1410 Cocaine abuse, uncomplicated: Secondary | ICD-10-CM

## 2017-06-25 DIAGNOSIS — K2980 Duodenitis without bleeding: Secondary | ICD-10-CM

## 2017-06-25 DIAGNOSIS — Z7289 Other problems related to lifestyle: Secondary | ICD-10-CM

## 2017-06-25 DIAGNOSIS — K920 Hematemesis: Principal | ICD-10-CM

## 2017-06-25 HISTORY — PX: ESOPHAGOGASTRODUODENOSCOPY (EGD) WITH PROPOFOL: SHX5813

## 2017-06-25 LAB — BASIC METABOLIC PANEL
ANION GAP: 10 (ref 5–15)
BUN: 44 mg/dL — ABNORMAL HIGH (ref 6–20)
CHLORIDE: 111 mmol/L (ref 101–111)
CO2: 22 mmol/L (ref 22–32)
Calcium: 8.5 mg/dL — ABNORMAL LOW (ref 8.9–10.3)
Creatinine, Ser: 2.65 mg/dL — ABNORMAL HIGH (ref 0.61–1.24)
GFR calc Af Amer: 28 mL/min — ABNORMAL LOW (ref >60)
GFR calc non Af Amer: 24 mL/min — ABNORMAL LOW (ref >60)
GLUCOSE: 103 mg/dL — AB (ref 65–99)
POTASSIUM: 4.3 mmol/L (ref 3.5–5.1)
Sodium: 143 mmol/L (ref 135–145)

## 2017-06-25 LAB — HEMOGLOBIN AND HEMATOCRIT, BLOOD
HCT: 27.7 % — ABNORMAL LOW (ref 39.0–52.0)
Hemoglobin: 8.7 g/dL — ABNORMAL LOW (ref 13.0–17.0)

## 2017-06-25 LAB — CBC
HEMATOCRIT: 28.3 % — AB (ref 39.0–52.0)
HEMOGLOBIN: 8.9 g/dL — AB (ref 13.0–17.0)
MCH: 24.9 pg — ABNORMAL LOW (ref 26.0–34.0)
MCHC: 31.4 g/dL (ref 30.0–36.0)
MCV: 79.3 fL (ref 78.0–100.0)
PLATELETS: 353 10*3/uL (ref 150–400)
RBC: 3.57 MIL/uL — AB (ref 4.22–5.81)
RDW: 16.2 % — ABNORMAL HIGH (ref 11.5–15.5)
WBC: 7 10*3/uL (ref 4.0–10.5)

## 2017-06-25 SURGERY — ESOPHAGOGASTRODUODENOSCOPY (EGD) WITH PROPOFOL
Anesthesia: Monitor Anesthesia Care

## 2017-06-25 MED ORDER — PROPOFOL 10 MG/ML IV BOLUS
INTRAVENOUS | Status: DC | PRN
  Administered 2017-06-25: 20 mg via INTRAVENOUS

## 2017-06-25 MED ORDER — MIDAZOLAM HCL 2 MG/2ML IJ SOLN
INTRAMUSCULAR | Status: AC
  Filled 2017-06-25: qty 2

## 2017-06-25 MED ORDER — LACTATED RINGERS IV SOLN
INTRAVENOUS | Status: DC
  Administered 2017-06-25: 1000 mL via INTRAVENOUS

## 2017-06-25 MED ORDER — MIDAZOLAM HCL 2 MG/2ML IJ SOLN
1.0000 mg | INTRAMUSCULAR | Status: DC
  Administered 2017-06-25: 2 mg via INTRAVENOUS

## 2017-06-25 MED ORDER — PANTOPRAZOLE SODIUM 40 MG PO TBEC: 40.0000 mg | DELAYED_RELEASE_TABLET | Freq: Two times a day (BID) | ORAL | 1 refills | Status: DC

## 2017-06-25 MED ORDER — PANTOPRAZOLE SODIUM 40 MG IV SOLR
8.0000 mg/h | INTRAVENOUS | Status: AC
Start: 2017-06-25 — End: 2017-06-25
  Administered 2017-06-25: 8 mg/h via INTRAVENOUS
  Filled 2017-06-25: qty 80

## 2017-06-25 MED ORDER — FENTANYL CITRATE (PF) 100 MCG/2ML IJ SOLN
25.0000 ug | Freq: Once | INTRAMUSCULAR | Status: AC
  Administered 2017-06-25: 100 ug via INTRAVENOUS

## 2017-06-25 MED ORDER — LIDOCAINE VISCOUS 2 % MT SOLN: 6.0000 mL | Freq: Once | OROMUCOSAL | Status: DC

## 2017-06-25 MED ORDER — PROPOFOL 500 MG/50ML IV EMUL
INTRAVENOUS | Status: DC | PRN
  Administered 2017-06-25: 75 ug/kg/min via INTRAVENOUS

## 2017-06-25 MED ORDER — LIDOCAINE VISCOUS 2 % MT SOLN
OROMUCOSAL | Status: AC
  Filled 2017-06-25: qty 15

## 2017-06-25 MED ORDER — FENTANYL CITRATE (PF) 100 MCG/2ML IJ SOLN
INTRAMUSCULAR | Status: AC
  Filled 2017-06-25: qty 2

## 2017-06-25 MED ORDER — PANTOPRAZOLE SODIUM 40 MG PO TBEC: 40.0000 mg | DELAYED_RELEASE_TABLET | Freq: Every day | ORAL | 1 refills | Status: DC

## 2017-06-25 NOTE — Clinical Social Work Note (Signed)
Clinical Social Work Assessment  Patient Details  Name: Lucas [name] MRN: [id number] Date of Birth: 1950/10/14  Date of referral:  06/25/17               Reason for consult:  Facility Placement                Permission sought to share information with:    Permission granted to share information::     Name::        Agency::     Relationship::     Contact Information:     Housing/Transportation Living arrangements for the past 2 months:  [place] of Information:  Patient, Facility Patient Interpreter Needed:  None Criminal Activity/Legal Involvement Pertinent to Current Situation/Hospitalization:  No - Comment as needed Significant Relationships:  Friend Lives with:  Facility Resident Do you feel safe going back to the place where you live?  Yes Need for family participation in patient care:  Yes (Comment)  Care giving concerns: Faciliyt resident.   Social Worker assessment / plan:  Patient is a resident at [facility] for [facility] at 06/03/17. He is a two person assist to stand and pivot.  He is incontinent of bowel and bladder. He is a one person assisit for activities of daily living.  Patient feeds himself with set up assist and makes inappropriate sexual comments to staff    Employment status:  Self-Employed Insurance information:  Medicaid In [place] PT Recommendations:  Not assessed at this time Information / Referral to community resources:     Patient/Family's Response to care:  Patient agreeable to return.   Patient/Family's Understanding of and Emotional Response to Diagnosis, Current Treatment, and Prognosis:  Patient and family have been made aware of patient's diagnosis, treatment and prognosis.   Emotional Assessment Appearance:  Appears stated age Attitude/Demeanor/Rapport:    Affect (typically observed):  Unable to Assess Orientation:  Oriented to Self, Oriented to Place Alcohol / Substance use:  Not Applicable Psych involvement  (Current and /or in the community):  No (Comment)  Discharge Needs  Concerns to be addressed:  Discharge Planning Concerns Readmission within the last 30 days:  Yes Current discharge risk:  None Barriers to Discharge:  No Barriers Identified   Ihor Gully, LCSW 06/25/2017, 10:17 AM

## 2017-06-25 NOTE — NC FL2 (Signed)
[place] LEVEL OF CARE SCREENING TOOL     IDENTIFICATION  Patient Name: Lucas [name] Birthdate: 1950/06/12 Sex: male Admission Date (Current Location): 06/24/2017  [facility] and [place] Number:  [employer] and Address:  [facility] [place] [place] [address], [place], [place] [address]      Provider Number: [id number]  Attending Physician Name and Address:  Kathie Dike, MD  Relative Name and Phone Number:       Current Level of Care: Hospital Recommended Level of Care: [place] Prior Approval Number:    Date Approved/Denied:   PASRR Number:    Discharge Plan: Home    Current Diagnoses: Patient Active Problem List   Diagnosis Date Noted  . UGI bleed 06/24/2017  . E coli bacteremia 06/17/2017  . Altered mental status 06/15/2017  . Pressure injury of skin 06/15/2017  . AKI (acute kidney injury) ([place]) 06/15/2017  . CKD (chronic kidney disease) stage 4, GFR 15-29 ml/min (HCC) 06/15/2017  . History of stroke 06/15/2017  . Acute lower UTI 06/15/2017  . Dehydration 06/15/2017  . Cocaine abuse ([place])   . Noncompliance w/medication treatment due to intermit use of medication   . Middle cerebral artery stenosis, right   . Stenosis of right carotid artery   . Cerebrovascular accident (CVA) ([place])   . 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 Smoker 06/12/2014  . Memory loss or impairment 02/13/2013  . Right shoulder pain 02/21/2011  . HTN (hypertension) 01/06/2011  . Hyperlipidemia with target LDL less than 100 01/06/2011  . Polysubstance abuse ([place]) 01/06/2011  . CVA (cerebral infarction) 01/06/2011  . Preventative health care 01/06/2011    Orientation RESPIRATION BLADDER Height & Weight     Self, Place  Normal Incontinent Weight: 123 lb 0.3 oz (55.8 kg) Height:  5\' 5"  (165.1 cm)  BEHAVIORAL SYMPTOMS/MOOD NEUROLOGICAL BOWEL NUTRITION STATUS      Incontinent Diet(see discharge summary)  AMBULATORY STATUS  COMMUNICATION OF NEEDS Skin   Limited Assist Verbally PU Stage and Appropriate Care([place], mid)                       Personal Care Assistance Level of Assistance  Bathing, Feeding, Dressing Bathing Assistance: Maximum assistance Feeding assistance: Limited assistance Dressing Assistance: Maximum assistance     Functional Limitations Info  Sight, Hearing, Speech Sight Info: Adequate Hearing Info: Adequate Speech Info: Adequate    SPECIAL CARE FACTORS FREQUENCY                       Contractures Contractures Info: Not present    Additional Factors Info  Code Status, Allergies Code Status Info: Full Code Allergies Info: Pork derived products           Current Medications (06/25/2017):  This is the current hospital active medication list Current Facility-Administered Medications  Medication Dose Route Frequency Provider Last Rate Last Dose  . amLODipine (NORVASC) tablet 10 mg  10 mg Oral Daily Thurnell Lose, MD   10 mg at 06/25/17 0921  . atorvastatin (LIPITOR) tablet 40 mg  40 mg Oral q1800 Thurnell Lose, MD      . bisacodyl (DULCOLAX) suppository 10 mg  10 mg Rectal Daily PRN Thurnell Lose, MD      . cloNIDine (CATAPRES) tablet 0.2 mg  0.2 mg Oral BID Thurnell Lose, MD   0.2 mg at 80/99/83 3825  . folic acid (FOLVITE) tablet 1 mg  1 mg Oral Daily Thurnell Lose, MD   1 mg at 06/25/17 3500  . hydrALAZINE (APRESOLINE) injection 10 mg  10 mg Intravenous Q6H PRN Thurnell Lose, MD   10 mg at 06/25/17 0550  . lidocaine (XYLOCAINE) 2 % viscous mouth solution 6 mL  6 mL Mouth/Throat Once Fields, Sandi L, MD      . pantoprazole (PROTONIX) 80 mg in sodium chloride 0.9 % 250 mL (0.32 mg/mL) infusion  8 mg/hr Intravenous Continuous Fields, Sandi L, MD      . thiamine (VITAMIN B-1) tablet 100 mg  100 mg Oral Daily Thurnell Lose, MD   100 mg at 06/25/17 9381     Discharge Medications: Please see discharge summary for a list of discharge  medications.  Relevant Imaging Results:  Relevant Lab Results:   Additional Information    Naftuli Dalsanto, Clydene Pugh, LCSW

## 2017-06-25 NOTE — Anesthesia Procedure Notes (Signed)
Procedure Name: MAC Date/Time: 06/25/2017 11:46 AM Performed by: Vista Deck, CRNA Pre-anesthesia Checklist: Patient identified, Emergency Drugs available, Suction available, Timeout performed and Patient being monitored Patient Re-evaluated:Patient Re-evaluated prior to induction Oxygen Delivery Method: Non-rebreather mask

## 2017-06-25 NOTE — Progress Notes (Signed)
Report given by nurse, pt waiting for transport.

## 2017-06-25 NOTE — Anesthesia Postprocedure Evaluation (Signed)
Anesthesia Post Note  Patient: Lucas [name]  Procedure(s) Performed: ESOPHAGOGASTRODUODENOSCOPY (EGD) WITH PROPOFOL (N/A )  Patient location during evaluation: PACU Anesthesia Type: MAC Level of consciousness: awake and patient cooperative Pain management: satisfactory to patient Vital Signs Assessment: post-procedure vital signs reviewed and stable Respiratory status: spontaneous breathing Cardiovascular status: stable Postop Assessment: no apparent nausea or vomiting Anesthetic complications: no     Last Vitals:  Vitals:   06/25/17 1245 06/25/17 1255  BP: (!) 120/57 118/60  Pulse: (!) 53 (!) 58  Resp: 18 17  Temp:    SpO2: 99%     Last Pain:  Vitals:   06/25/17 1230  TempSrc:   PainSc: Asleep                 Sinahi Knights

## 2017-06-25 NOTE — Progress Notes (Signed)
Report called to nurse, Edd Arbour, at [facility]. Patient to be transported back to [facility].  Patient awaiting EMS arrival.

## 2017-06-25 NOTE — Transfer of Care (Signed)
Immediate Anesthesia Transfer of Care Note  Patient: LUCIEN BUDNEY  Procedure(s) Performed: ESOPHAGOGASTRODUODENOSCOPY (EGD) WITH PROPOFOL (N/A )  Patient Location: PACU  Anesthesia Type:MAC  Level of Consciousness: drowsy and patient cooperative  Airway & Oxygen Therapy: Patient Spontanous Breathing and non-rebreather face mask  Post-op Assessment: Report given to RN and Post -op Vital signs reviewed and stable  Post vital signs: Reviewed and stable  Last Vitals:  Vitals:   06/25/17 1140 06/25/17 1145  BP: 113/61 124/65  Pulse:    Resp: 18 (!) 0  Temp:    SpO2: 100% 100%    Last Pain:  Vitals:   06/25/17 0443  TempSrc: Oral  PainSc:          Complications: No apparent anesthesia complications

## 2017-06-25 NOTE — Discharge Summary (Signed)
Physician Discharge Summary  Lucas [name] LDJ:[id number] DOB: 1950-09-23 DOA: 06/24/2017  PCP: [name], MD  Admit date: 06/24/2017 Discharge date: 06/25/2017  Admitted From: Skilled nursing facility Disposition: Skilled nursing facility  Recommendations for Outpatient Follow-up:  1. Follow up with PCP in 1-2 weeks 2. Please obtain BMP/CBC in one week 3. Follow-up with GI in 4 weeks for colonoscopy to further evaluate anemia  Discharge Condition: Stable CODE STATUS: Full code Diet recommendation: Heart Healthy  Brief/Interim Summary: 67 year old male with a history of recent stroke with residual right-sided hemiparesis, chronic kidney disease stage IV, hypertension, history of cocaine and alcohol abuse, resident of a nursing facility, admitted to the hospital with hematemesis.  Patient did not have any further episodes since being in the hospital.  Hemoglobin was approximately 11 on admission, but he was noted to be hemoconcentrated.  After IV fluids and dilution, hemoglobin return to baseline of 8.9.  He was seen by gastroenterology and was started on Protonix.  He underwent EGD that was relatively unrevealing.  Recommendations were to continue on Protonix twice daily for the next 30 days and then once daily thereafter.  He was also recommended to continue the patient on aspirin and Plavix for now.  He will follow-up with gastroenterology as an outpatient for colonoscopy in the next 4 weeks to complete workup for anemia.  Patient is tolerating solid diet.  The remainder of his labs are at baseline.  He is not had any further hematemesis.  He is felt stable to discharge.  Discharge Diagnoses:  Principal Problem:   UGI bleed Active Problems:   HTN (hypertension)   Hyperlipidemia with target LDL less than 100   Cerebrovascular accident (CVA) ([place])   Cocaine abuse ([place])   CKD (chronic kidney disease) stage 4, GFR 15-29 ml/min (HCC)   History of stroke    Discharge  Instructions  Discharge Instructions    Diet - low sodium heart healthy   Complete by:  As directed    Increase activity slowly   Complete by:  As directed      Allergies as of 06/25/2017      Reactions   Pork-derived Products Other (See Comments)   Patient does not eat pork products.      Medication List    TAKE these medications   amLODipine 10 MG tablet Commonly known as:  NORVASC Take 1 tablet (10 mg total) by mouth daily.   aspirin 325 MG EC tablet Take 1 tablet (325 mg total) by mouth daily.   atorvastatin 40 MG tablet Commonly known as:  LIPITOR Take 1 tablet (40 mg total) by mouth daily at 6 PM.   cefUROXime 250 MG tablet Commonly known as:  CEFTIN Take 1 tablet (250 mg total) by mouth 2 (two) times daily with a meal for 13 days.   cloNIDine 0.2 MG tablet Commonly known as:  CATAPRES Take 1 tablet (0.2 mg total) by mouth 2 (two) times daily.   clopidogrel 75 MG tablet Commonly known as:  PLAVIX Take 1 tablet (75 mg total) by mouth daily.   folic acid 1 MG tablet Commonly known as:  FOLVITE Take 1 tablet (1 mg total) by mouth daily.   lisinopril 20 MG tablet Commonly known as:  PRINIVIL,ZESTRIL Take 20 mg by mouth daily.   pantoprazole 40 MG tablet Commonly known as:  PROTONIX Take 1 tablet (40 mg total) by mouth 2 (two) times daily before a meal.       Allergies  Allergen  Reactions  . Pork-Derived Products Other (See Comments)    Patient does not eat pork products.    Consultations:  Gastroenterology   Procedures/Studies: Dg Chest 2 View  Result Date: 06/24/2017 CLINICAL DATA:  Vomiting, coffee-ground appearance EXAM: CHEST - 2 VIEW COMPARISON:  06/15/2017 FINDINGS: Chronic cardiomegaly. Aortic atherosclerosis. Stable aortic and hilar contours. There is no edema, consolidation, effusion, or pneumothorax. No visible pneumoperitoneum. IMPRESSION: 1. No evidence of active cardiopulmonary disease. 2. Chronic cardiomegaly. Electronically Signed    By: Monte Fantasia M.D.   On: 06/24/2017 12:01   Dg Chest 2 View  Result Date: 06/15/2017 CLINICAL DATA:  Worsening lethargy and weakness.  Low-grade fever. EXAM: CHEST  2 VIEW COMPARISON:  05/26/2017 FINDINGS: Lordotic positioning. Chronic cardiomegaly and aortic atherosclerosis. Lungs appear clear allowing for positioning. There may be central bronchial thickening but there is no consolidation or collapse. No sign of heart failure. IMPRESSION: Chronic cardiomegaly and aortic atherosclerosis. Possible bronchitis but no consolidation or collapse. Electronically Signed   By: Nelson Chimes M.D.   On: 06/15/2017 10:23   Ct Head Wo Contrast  Result Date: 06/15/2017 CLINICAL DATA:  Lethargy, generalized weakness, bradycardia, low-grade fever, history of stroke, hypertension, chronic diastolic heart failure, smoker EXAM: CT HEAD WITHOUT CONTRAST TECHNIQUE: Contiguous axial images were obtained from the base of the skull through the vertex without intravenous contrast. Sagittal and coronal MPR images reconstructed from axial data set. COMPARISON:  05/24/2017 FINDINGS: Brain: Generalized atrophy. Normal ventricular morphology. No midline shift or mass effect. Small vessel chronic ischemic changes of deep cerebral white matter. Small old LEFT cerebellar infarct. Old BILATERAL basal ganglia and question tiny thalamic lacunar infarcts. No intracranial hemorrhage, mass lesion, evidence of acute infarction, or extra-axial fluid collection. Vascular: Atherosclerotic calcifications of internal carotid arteries bilaterally at skull base Skull: Intact Sinuses/Orbits: Clear Other: N/A IMPRESSION: Atrophy with small vessel chronic ischemic changes of deep cerebral white matter. Multiple old old infarcts as above. No acute intracranial abnormalities. Electronically Signed   By: Lavonia Dana M.D.   On: 06/15/2017 10:36   EGD:- Small hiatal hernia.                           - MILD Gastritis/DUODENITIS. Biopsied.                            - NO OBVIOUS GI SOURCE FOR HEMATEMESIS      Subjective: No chest pain or shortness of breath.  No vomiting.  Discharge Exam: Vitals:   06/25/17 1255 06/25/17 1311  BP: 118/60 140/61  Pulse: (!) 58 (!) 58  Resp: 17 18  Temp:  98.1 F (36.7 C)  SpO2:  100%   Vitals:   06/25/17 1238 06/25/17 1245 06/25/17 1255 06/25/17 1311  BP:  (!) 120/57 118/60 140/61  Pulse: 61 (!) 53 (!) 58 (!) 58  Resp: (!) 21 18 17 18   Temp:    98.1 F (36.7 C)  TempSrc:    Oral  SpO2: 98% 99%  100%  Weight:      Height:        General: Pt is alert, awake, not in acute distress Cardiovascular: RRR, S1/S2 +, no rubs, no gallops Respiratory: CTA bilaterally, no wheezing, no rhonchi Abdominal: Soft, NT, ND, bowel sounds + Extremities: no edema, no cyanosis    The results of significant diagnostics from this hospitalization (including imaging, microbiology, ancillary and laboratory) are listed below  for reference.     Microbiology: No results found for this or any previous visit (from the past 240 hour(s)).   Labs: BNP (last 3 results) Recent Labs    06/15/17 0955  BNP 287.8*   Basic Metabolic Panel: Recent Labs  Lab 06/24/17 1017 06/25/17 0241  NA 142 143  K 4.9 4.3  CL 105 111  CO2 22 22  GLUCOSE 108* 103*  BUN 45* 44*  CREATININE 2.95* 2.65*  CALCIUM 9.4 8.5*   Liver Function Tests: Recent Labs  Lab 06/24/17 1017  AST 21  ALT 19  ALKPHOS 88  BILITOT 0.4  PROT 8.7*  ALBUMIN 3.6   No results for input(s): LIPASE, AMYLASE in the last 168 hours. No results for input(s): AMMONIA in the last 168 hours. CBC: Recent Labs  Lab 06/24/17 1017 06/25/17 0241 06/25/17 1620  WBC 15.6* 7.0  --   NEUTROABS 14.2*  --   --   HGB 11.4* 8.9* 8.7*  HCT 35.6* 28.3* 27.7*  MCV 78.9 79.3  --   PLT 479* 353  --    Cardiac Enzymes: No results for input(s): CKTOTAL, CKMB, CKMBINDEX, TROPONINI in the last 168 hours. BNP: Invalid input(s): POCBNP CBG: No results for  input(s): GLUCAP in the last 168 hours. D-Dimer No results for input(s): DDIMER in the last 72 hours. Hgb A1c No results for input(s): HGBA1C in the last 72 hours. Lipid Profile No results for input(s): CHOL, HDL, LDLCALC, TRIG, CHOLHDL, LDLDIRECT in the last 72 hours. Thyroid function studies No results for input(s): TSH, T4TOTAL, T3FREE, THYROIDAB in the last 72 hours.  Invalid input(s): FREET3 Anemia work up No results for input(s): VITAMINB12, FOLATE, FERRITIN, TIBC, IRON, RETICCTPCT in the last 72 hours. Urinalysis    Component Value Date/Time   COLORURINE STRAW (A) 06/24/2017 1835   APPEARANCEUR CLEAR 06/24/2017 1835   LABSPEC 1.011 06/24/2017 1835   PHURINE 6.0 06/24/2017 [place] 06/24/2017 [place] (A) 06/24/2017 [place] 06/24/2017 [place] 06/24/2017 1835   PROTEINUR 100 (A) 06/24/2017 1835   UROBILINOGEN 0.2 12/09/2010 1117   NITRITE NEGATIVE 06/24/2017 1835   LEUKOCYTESUR NEGATIVE 06/24/2017 1835   Sepsis Labs Invalid input(s): PROCALCITONIN,  WBC,  LACTICIDVEN Microbiology No results found for this or any previous visit (from the past 240 hour(s)).   Time coordinating discharge: Over 30 minutes  SIGNED:   Kathie Dike, MD  [facility] 06/25/2017, 5:39 PM Pager   If 7PM-7AM, please contact night-coverage [email or web address] Password [email or web address]

## 2017-06-25 NOTE — Anesthesia Preprocedure Evaluation (Signed)
Anesthesia Evaluation  Patient identified by MRN, date of birth, ID band Patient awake    Reviewed: Allergy & Precautions, NPO status , Patient's Chart, lab work & pertinent test results  Airway Mallampati: II  TM Distance: >3 FB     Dental  (+) Edentulous Upper, Edentulous Lower   Pulmonary former smoker,    breath sounds clear to auscultation       Cardiovascular hypertension, Pt. on medications + Peripheral Vascular Disease and +CHF   Rhythm:Regular Rate:Normal     Neuro/Psych CVA    GI/Hepatic (+)     substance abuse  alcohol use and cocaine use,   Endo/Other    Renal/GU Renal InsufficiencyRenal disease     Musculoskeletal   Abdominal   Peds  Hematology   Anesthesia Other Findings   Reproductive/Obstetrics                             Anesthesia Physical Anesthesia Plan  ASA: III  Anesthesia Plan: MAC   Post-op Pain Management:    Induction: Intravenous  PONV Risk Score and Plan:   Airway Management Planned: Simple Face Mask  Additional Equipment:   Intra-op Plan:   Post-operative Plan:   Informed Consent: I have reviewed the patients History and Physical, chart, labs and discussed the procedure including the risks, benefits and alternatives for the proposed anesthesia with the patient or authorized representative who has indicated his/her understanding and acceptance.     Plan Discussed with:   Anesthesia Plan Comments:         Anesthesia Quick Evaluation

## 2017-06-25 NOTE — Op Note (Signed)
[facility] Patient Name: Lucas [name] Procedure Date: 06/25/2017 11:23 AM MRN: [id number] Date of Birth: [birth year] Attending MD: Barney Drain MD, MD CSN: [id number] Age: 67 Admit Type: Outpatient Procedure:                Upper GI endoscopy WITH COLD FORCEPS BIOPSY Indications:              Hematemesis Providers:                Barney Drain MD, MD, Janeece Riggers, RN, Randa Spike, Technician Referring MD:              Medicines:                Propofol per Anesthesia Complications:            No immediate complications. Estimated Blood Loss:     Estimated blood loss was minimal. Procedure:                Pre-Anesthesia Assessment:                           - Prior to the procedure, a History and Physical                            was performed, and patient medications and                            allergies were reviewed. The patient's tolerance of                            previous anesthesia was also reviewed. The risks                            and benefits of the procedure and the sedation                            options and risks were discussed with the patient.                            All questions were answered, and informed consent                            was obtained. Prior Anticoagulants: The patient has                            taken aspirin, last dose was 1 day prior to                            procedure. ASA Grade Assessment: II - A patient                            with mild systemic disease. After reviewing the  risks and benefits, the patient was deemed in                            satisfactory condition to undergo the procedure.                            After obtaining informed consent, the endoscope was                            passed under direct vision. Throughout the                            procedure, the patient's blood pressure, pulse, and                            oxygen  saturations were monitored continuously. The                            EG-[id number]) scope was introduced through the                            mouth, and advanced to the second part of duodenum.                            The upper GI endoscopy was accomplished without                            difficulty. The patient tolerated the procedure                            well. Scope In: 11:59:44 AM Scope Out: 12:06:02 PM Total Procedure Duration: 0 hours 6 minutes 18 seconds  Findings:      The examined esophagus was normal.      A small hiatal hernia was present.      Patchy mild inflammation characterized by congestion (edema), erosions       and erythema was found in the gastric antrum. Biopsies were taken with a       cold forceps for Helicobacter pylori testing.      Diffuse mild inflammation characterized by congestion (edema), erythema       and friability was found in the duodenal bulb.      The second portion of the duodenum was normal. Impression:               - Small hiatal hernia.                           - MILD Gastritis/DUODENITIS. Biopsied.                           - NO OBVIOUS GI SOURCE FOR HEMATEMESIS IDENTIFIED. Moderate Sedation:      Per Anesthesia Care Recommendation:           - Await pathology results.                           - Cardiac  diet.                           - Continue present medications. PROTONIX BID FOR 30                            DAYS THEN ONCE DAILY FOREVER.                           - Return to GI office in 4 months. NEED TCS FOR                            ANEMIA AS AN OUTPT IF PT AGREEABLE.                           - Return patient to hospital ward. Procedure Code(s):        --- Professional ---                           [phone], Esophagogastroduodenoscopy, flexible,                            transoral; with biopsy, single or multiple Diagnosis Code(s):        --- Professional ---                           K44.9, Diaphragmatic  hernia without obstruction or                            gangrene                           K29.70, Gastritis, unspecified, without bleeding                           K29.80, Duodenitis without bleeding                           K92.0, Hematemesis CPT copyright 2016 American Medical Association. All rights reserved. The codes documented in this report are preliminary and upon coder review may  be revised to meet current compliance requirements. Barney Drain, MD Barney Drain MD, MD 06/25/2017 12:16:03 PM This report has been signed electronically. Number of Addenda: 0

## 2017-06-25 NOTE — H&P (Signed)
Primary Care Physician:  [name], MD Primary Gastroenterologist:  Dr. Oneida Alar  Pre-Procedure History & Physical: HPI:  Lucas [name] is a 67 y.o. male here for HEMATEMESIS after ASA/PLAVIX and no PPI.  Past Medical History:  Diagnosis Date  . Carotid artery stenosis   . Chronic diastolic heart failure ([place])   . Chronic kidney disease, stage IV (severe) ([place])   . Cognitive deficits   . CVA (cerebral infarction) 11/2010   CT 8/12 : Sm ischemic infarct posterior limb of L internal capsule. Carotids nl. ECHO nl with EF 55-60%. R sided weakness.   . Hyperlipemia   . Hyperlipidemia 01/06/2011   Diagnosed during acute CVA hospitalization. On statin.  Marland Kitchen Hypertension 11/2010   Diagnosed during acute CVA hospitalization  . Occlusion of anterior cerebral artery   . Polysubstance abuse ([place]) 01/06/2011   cocaine, tobacco, THC    History reviewed. No pertinent surgical history.  Prior to Admission medications   Medication Sig Start Date End Date Taking? Authorizing Provider  amLODipine (NORVASC) 10 MG tablet Take 1 tablet (10 mg total) by mouth daily. 06/12/14  Yes Funches, Adriana Mccallum, MD  aspirin EC 325 MG EC tablet Take 1 tablet (325 mg total) by mouth daily. 06/03/17  Yes Patrecia Pour, Christean Grief, MD  atorvastatin (LIPITOR) 40 MG tablet Take 1 tablet (40 mg total) by mouth daily at 6 PM. 06/02/17  Yes Patrecia Pour, Christean Grief, MD  cefUROXime (CEFTIN) 250 MG tablet Take 1 tablet (250 mg total) by mouth 2 (two) times daily with a meal for 13 days. 06/17/17 06/30/17 Yes Johnson, Clanford L, MD  clopidogrel (PLAVIX) 75 MG tablet Take 1 tablet (75 mg total) by mouth daily. 06/03/17  Yes Patrecia Pour, Christean Grief, MD  folic acid (FOLVITE) 1 MG tablet Take 1 tablet (1 mg total) by mouth daily. 06/03/17  Yes Patrecia Pour, Christean Grief, MD  lisinopril (PRINIVIL,ZESTRIL) 20 MG tablet Take 20 mg by mouth daily.   Yes [provider]  cloNIDine (CATAPRES) 0.2 MG tablet Take 1 tablet (0.2 mg total) by mouth 2 (two)  times daily. Patient not taking: Reported on 06/24/2017 06/02/17   Doreatha Lew, MD    Allergies as of 06/24/2017 - Review Complete 06/24/2017  Allergen Reaction Noted  . Pork-derived products Other (See Comments) 02/20/2011    Family History  Problem Relation Age of Onset  . Hypertension Mother   . Hypertension Father     Social History   Socioeconomic History  . Marital status: Single    Spouse name: Not on file  . Number of children: Not on file  . Years of education: Not on file  . Highest education level: Not on file  Social Needs  . Financial resource strain: Not on file  . Food insecurity - worry: Not on file  . Food insecurity - inability: Not on file  . Transportation needs - medical: Not on file  . Transportation needs - non-medical: Not on file  Occupational History  . Not on file  Tobacco Use  . Smoking status: Former Smoker    Packs/day: 0.20    Years: 40.00    Pack years: 8.00    Types: Cigarettes    Start date: 01/02/1965  . Smokeless tobacco: Never Used  . Tobacco comment: 2-3 per week  Substance and Sexual Activity  . Alcohol use: Yes    Alcohol/week: 1.2 oz    Types: 2 Standard drinks or equivalent per week    Comment: beer  . Drug use:  No  . Sexual activity: No  Other Topics Concern  . Not on file  Social History Narrative  . Not on file    Review of Systems: See HPI, otherwise negative ROS   Physical Exam: BP 117/62   Pulse 60   Temp 98.1 F (36.7 C)   Resp 18   Ht 5\' 5"  (1.651 m)   Wt 123 lb 0.3 oz (55.8 kg)   SpO2 98%   BMI 20.47 kg/m  General:   Alert,  pleasant and cooperative in NAD Head:  Normocephalic and atraumatic. Neck:  Supple; Lungs:  Clear throughout to auscultation.    Heart:  Regular rate and rhythm. Abdomen:  Soft, nontender and nondistended. Normal bowel sounds, without guarding, and without rebound.   Neurologic:  Alert and  oriented x4;  grossly normal neurologically.  Impression/Plan:      HEMATEMESIS  PLAN:  EGD TODAY DISCUSSED PROCEDURE, BENEFITS, & RISKS: < 1% chance of medication reaction, bleeding, OR perforation.

## 2017-06-26 LAB — URINE CULTURE: CULTURE: NO GROWTH

## 2017-06-29 ENCOUNTER — Encounter: Payer: Self-pay | Admitting: Gastroenterology

## 2017-06-29 NOTE — Progress Notes (Signed)
PATIENT SCHEDULED  °

## 2017-07-05 ENCOUNTER — Encounter: Payer: Self-pay | Admitting: Adult Health

## 2017-07-08 ENCOUNTER — Encounter: Payer: Self-pay | Admitting: Gastroenterology

## 2017-07-22 ENCOUNTER — Ambulatory Visit: Payer: Medicaid Other | Admitting: Adult Health

## 2017-07-22 ENCOUNTER — Encounter: Payer: Self-pay | Admitting: Adult Health

## 2017-07-22 VITALS — BP 190/82 | HR 53 | Ht 65.0 in | Wt 138.0 lb

## 2017-07-22 DIAGNOSIS — I6381 Other cerebral infarction due to occlusion or stenosis of small artery: Secondary | ICD-10-CM | POA: Diagnosis not present

## 2017-07-22 NOTE — Patient Instructions (Signed)
Continue aspirin 325 mg daily and clopidogrel 75 mg daily  and lipitor  for secondary stroke prevention  Stop aspirin 08/23/17 and continue plavix only  Be compliant with all medications as this will decrease chances for a stroke  Be compliant with therapies as this will help you recover  Referral for sleep apnea   Maintain strict control of hypertension with blood pressure goal below 130/90, diabetes with hemoglobin A1c goal below 6.5% and cholesterol with LDL cholesterol (bad cholesterol) goal below 70 mg/dL. I also advised the patient to eat a healthy diet with plenty of whole grains, cereals, fruits and vegetables, exercise regularly and maintain ideal body weight.  Followup in the future with me in 4 months

## 2017-07-22 NOTE — Progress Notes (Signed)
[facility] [address] [place] [address] [phone]       OFFICE FOLLOW UP NOTE  Mr. JAKHARI SPACE Date of Birth:  12/10/1950 Medical Record Number:  [id number]   Reason for Referral:  hospital stroke follow up  CHIEF COMPLAINT:  Chief Complaint  Patient presents with  . Follow-up    pt at a rehab facility, pt with a worker from there. pt states he has not gotten better since being dc there    HPI: Lucas [name] is being seen today for initial visit in the office for left lacunar stroke on 05/24/17. History obtained from patient and chart review. Reviewed all radiology images and labs personally.  Lucas [name] is an 67 y.o. male who lives in a hotel with PMH of HTN, HLD, and polysubstance abuse.  Patient came to [facility] ED on 05/24/17 with complaint of right-sided weakness that began a day prior.  Patient admits to having hypertension but not taking his meds as prescribed.  He takes his meds when he thinks he needs him. Hypertensive with blood pressure at 250/82.  Patient had urine drug positive for cocaine however he does not admit to using cocaine.  CT head demonstrated a new lacunar infarct in the left corona radiata and superior basal ganglia.  Patient states usually can use his right side however at this point he cannot use his right side.  MRI of the head showed an acute left thalamus infarct and an acute versus subacute left frontal lobe punctate infarct.  The scan did show old bilateral basal ganglia, thalamus, and pontine lacunar infarcts.  MRA of the head showed no emergent large vessel occlusion but did show severe stenosis in the right ICA cavernous segment and slow flow right MCA with severe stenosis right M2 origin.Bilateral carotid Dopplers showed bilateral stenosis of 1-39%.  2D echo normal without cardiac source of emboli.  LDL 167 and A1c 5.3.  Patient was previously prescribed Lipitor 40 mg but unsure if he was taking this appropriately. It  was recommended to continue Lipitor 40 mg.  Patient was prescribed 81 mg PTA but was not taking appropriately.  It was recommended patient take aspirin 325 mg along with Plavix 75 mg for 3 months and then just Plavix daily.  Patient discharged to SNF facility. Recent hospital admission on 06/15/2017 for low-grade fever, generalized weakness and lethargy.  He was giving fluids, and treated for E. coli gram-negative bacteremia.  Patient discharged back to skilled nursing facility. Recent hospital admission on 06/24/2017 for hematemesis.  Underwent EGD which was unrevealing and recommended to continue Protonix twice a day for the next 30 days but also continue aspirin and Plavix.  Recommend colonoscopy in 4 weeks.  Return back to SNF. Patient is being seen today for stroke follow-up he continues to have right-sided hemiparesis as he has been refusing to participate in PT/OT.  He also has been refusing his blood pressure medications and at today's visit blood pressure is 198/82.  Recheck manual blood pressure at 192/82.  He is not symptomatic his he feels fine at this time.  He is continue to take aspirin and Plavix without increase in bleeding or bruising but recommended to be discharged on aspirin 325 but currently taking aspirin 81 mg.  He will continue to take DA PT until 08/23/2017 and then continue Plavix alone.  When questioned about reasoning behind refusing therapy and blood pressure, he states that he feels as though he does not need  it.  Stressed the importance of controlling blood pressure and cholesterol as this will help decrease risk for having an additional stroke and PT/OT to help him recover.  I advised him of the importance of participating in therapy as he can start to develop generalized atrophy and weakness from sitting in a wheelchair.  Also advised him the importance of being compliant with his medications to help control his risk factors such as HLD and HTN and if he continues to refuse these  medications, his chances of an additional stroke are very likely.  Patient states that he does not want to have an additional stroke and will start being compliant with PT/OT and medications.  It seems as though he has been compliant with Lipitor as his LDL currently 82 on 07/07/2017 (see previous 167).  No new or worsening stroke/TIA symptoms.    ROS:   14 system review of systems performed and negative with exception of weakness, difficulty walking  PMH:  Past Medical History:  Diagnosis Date  . Carotid artery stenosis   . Chronic diastolic heart failure ([place])   . Chronic kidney disease, stage IV (severe) ([place])   . Cognitive deficits   . CVA (cerebral infarction) 11/2010   CT 8/12 : Sm ischemic infarct posterior limb of L internal capsule. Carotids nl. ECHO nl with EF 55-60%. R sided weakness.   . Hyperlipemia   . Hyperlipidemia 01/06/2011   Diagnosed during acute CVA hospitalization. On statin.  Marland Kitchen Hypertension 11/2010   Diagnosed during acute CVA hospitalization  . Occlusion of anterior cerebral artery   . Polysubstance abuse ([place]) 01/06/2011   cocaine, tobacco, THC    PSH:  Past Surgical History:  Procedure Laterality Date  . ESOPHAGOGASTRODUODENOSCOPY (EGD) WITH PROPOFOL N/A 06/25/2017   Procedure: ESOPHAGOGASTRODUODENOSCOPY (EGD) WITH PROPOFOL;  Surgeon: Danie Binder, MD;  Location: [facility];  Service: Endoscopy;  Laterality: N/A;    Social History:  Social History   Socioeconomic History  . Marital status: Single    Spouse name: Not on file  . Number of children: Not on file  . Years of education: Not on file  . Highest education level: Not on file  Occupational History  . Not on file  Social Needs  . Financial resource strain: Not on file  . Food insecurity:    Worry: Not on file    Inability: Not on file  . Transportation needs:    Medical: Not on file    Non-medical: Not on file  Tobacco Use  . Smoking status: Former Smoker    Packs/day: 0.20     Years: 40.00    Pack years: 8.00    Types: Cigarettes    Start date: 01/02/1965  . Smokeless tobacco: Never Used  . Tobacco comment: 2-3 per week  Substance and Sexual Activity  . Alcohol use: Yes    Alcohol/week: 1.2 oz    Types: 2 Standard drinks or equivalent per week    Comment: beer  . Drug use: No  . Sexual activity: Never  Lifestyle  . Physical activity:    Days per week: Not on file    Minutes per session: Not on file  . Stress: Not on file  Relationships  . Social connections:    Talks on phone: Not on file    Gets together: Not on file    Attends religious service: Not on file    Active member of club or organization: Not on file    Attends meetings  of clubs or organizations: Not on file    Relationship status: Not on file  . Intimate partner violence:    Fear of current or ex partner: Not on file    Emotionally abused: Not on file    Physically abused: Not on file    Forced sexual activity: Not on file  Other Topics Concern  . Not on file  Social History Narrative  . Not on file    Family History:  Family History  Problem Relation Age of Onset  . Hypertension Mother   . Hypertension Father     Medications:   Current Outpatient Medications on File Prior to Visit  Medication Sig Dispense Refill  . amLODipine (NORVASC) 10 MG tablet Take 1 tablet (10 mg total) by mouth daily. 90 tablet 3  . aspirin EC 81 MG tablet Take 81 mg by mouth daily.    Marland Kitchen atorvastatin (LIPITOR) 40 MG tablet Take 1 tablet (40 mg total) by mouth daily at 6 PM.    . cloNIDine (CATAPRES) 0.2 MG tablet Take 1 tablet (0.2 mg total) by mouth 2 (two) times daily.    . clopidogrel (PLAVIX) 75 MG tablet Take 1 tablet (75 mg total) by mouth daily.    . folic acid (FOLVITE) 1 MG tablet Take 1 tablet (1 mg total) by mouth daily.    Marland Kitchen lisinopril (PRINIVIL,ZESTRIL) 20 MG tablet Take 20 mg by mouth daily.    . pantoprazole (PROTONIX) 40 MG tablet Take 1 tablet (40 mg total) by mouth 2 (two) times  daily before a meal. 30 tablet 1  . sertraline (ZOLOFT) 50 MG tablet Take 50 mg by mouth daily.    Marland Kitchen zinc sulfate 220 (50 Zn) MG capsule Take 220 mg by mouth daily.     No current facility-administered medications on file prior to visit.     Allergies:   Allergies  Allergen Reactions  . Pork-Derived Products Other (See Comments)    Patient does not eat pork products.     Physical Exam  Vitals:   07/22/17 1346 07/22/17 1410  BP: (!) 198/87 (!) 190/82  Pulse: (!) 53   Weight: 138 lb (62.6 kg)   Height: 5\' 5"  (1.651 m)    Body mass index is 22.96 kg/m. No exam data present  General: Frail pleasant elderly African-American male, well nourished, seated, in no evident distress Head: head normocephalic and atraumatic.   Neck: supple with no carotid or supraclavicular bruits Cardiovascular: regular rate and rhythm, no murmurs Musculoskeletal: no deformity Skin:  no rash/petichiae Vascular:  Normal pulses all extremities  Neurologic Exam Mental Status: Awake and fully alert. Oriented to place and time. Recent and remote memory intact. Attention span, concentration and fund of knowledge appropriate. Mood and affect appropriate.  Cranial Nerves: Fundoscopic exam reveals sharp disc margins. Pupils equal, briskly reactive to light. Extraocular movements full without nystagmus. Visual fields full to confrontation. Hearing intact. Facial sensation intact. Face, tongue, palate moves normally and symmetrically.  Motor: Generalized weakness throughout but increased weakness right upper and lower extremity. Sensory.: intact to touch , pinprick , position and vibratory sensation.  Coordination: Decreased right finger dexterity.  Left arm orbits over right. Gait and Station: Currently sitting in wheelchair and unable to ambulate without a walker which is not present at today's appointment.  Gait not tested Reflexes: Brisk and symmetric. Toes downgoing.    NIHSS  2 Modified Rankin  4    Diagnostic Data (Labs, Imaging, Testing)  Ct Head Wo Contrast  Result Date: 05/24/2017 IMPRESSION: 1. No evidence of acute large territory infarct or intracranial hemorrhage. 2. New lacunar infarcts in the left corona radiata/superior basal ganglia, possibly acute or subacute. 3. 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    MRI/MRA Brain/Head:   IMPRESSION: MRI HEAD:  1. Acute subcentimeter nonhemorrhagic LEFT thalamus infarct. Acute versus subacute LEFT frontal lobe punctate infarct. 2. Old bilateral basal ganglia and thalamus infarcts. Old pontine lacunar infarcts. 3. Moderate chronic small vessel ischemic disease. 4. Moderate parenchymal brain volume loss.  MRA HEAD:  1. No emergent large vessel occlusion. 2. Severe stenosis RIGHT ICA cavernous segment. Slow flow RIGHT MCA with severe stenosis RIGHT M2 origin.   Echocardiogram:                                    Study Conclusions  - Left ventricle: The cavity size was normal. Wall thickness was increased in a pattern of moderate LVH. Systolic function was normal. The estimated ejection fraction was in the range of 55% to 60%. Wall motion was normal; there were no regional wall motion abnormalities. Features are consistent with a pseudonormal left ventricular filling pattern, with concomitant abnormal relaxation and increased filling pressure (grade 2 diastolic dysfunction). - Aortic valve: Trileaflet; mildly thickened, mildly calcified leaflets. There was mild regurgitation. - Mitral valve: Calcified annulus. There was mild regurgitation. - Left atrium: The atrium was moderately dilated. Volume/bsa, ES, (1-plane Simpson&'s, A2C): 44.4 ml/m^2. - Tricuspid valve: There was mild regurgitation. - Pulmonary arteries: Systolic pressure was mildly increased. Impressions:- No cardiac source of emboli was indentified.   B/L Carotid U/S:  1-39% ICA  stenosis       ASSESSMENT: Lucas [name] is a 67 y.o. year old male here with left lacunar infarct on 05/24/2017 secondary to polysubstance abuse and small vessel disease. Vascular risk factors include HTN, HLD, and polysubstance abuse.     PLAN: -Continue aspirin 325 mg daily and clopidogrel 75 mg daily  and Lipitor for secondary stroke prevention -recommend increasing aspirin from 81mg  to 325mg  until d/c -D/C aspirin and continue Plavix only after 08/24/17 -F/u with PCP regarding your HLD and HTN management -be compliant with medications and therapies -Maintain strict control of hypertension with blood pressure goal below 130/90, diabetes with hemoglobin A1c goal below 6.5% and cholesterol with LDL cholesterol (bad cholesterol) goal below 70 mg/dL. I also advised the patient to eat a healthy diet with plenty of whole grains, cereals, fruits and vegetables, exercise regularly and maintain ideal body weight.  Follow up in 4 months or call earlier if needed  Greater than 50% time during this 25 minute consultation visit was spent on counseling and coordination of care about HTN, and HLD, discussion about risk benefit of anticoagulation and answering questions.   Venancio Poisson, AGNP-BC  [facility] [address] [place], [place] [address]  Phone [phone] Fax [phone]

## 2017-07-30 NOTE — Progress Notes (Signed)
I agree with the above plan 

## 2017-11-02 ENCOUNTER — Ambulatory Visit: Payer: Medicaid Other | Admitting: Gastroenterology

## 2017-11-03 ENCOUNTER — Ambulatory Visit (INDEPENDENT_AMBULATORY_CARE_PROVIDER_SITE_OTHER): Payer: Medicaid Other | Admitting: Gastroenterology

## 2017-11-03 ENCOUNTER — Encounter: Payer: Self-pay | Admitting: Gastroenterology

## 2017-11-03 VITALS — BP 179/67 | HR 52 | Temp 96.7°F | Ht 65.0 in | Wt 123.2 lb

## 2017-11-03 DIAGNOSIS — D649 Anemia, unspecified: Secondary | ICD-10-CM | POA: Diagnosis not present

## 2017-11-03 NOTE — Progress Notes (Signed)
CC'D TO PCP °
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
## 2017-11-03 NOTE — Patient Instructions (Addendum)
1. CBC with diff, iron, ferritin, TIBC. 2. Collect stool ifobt and return to our office. 3. Continue pantoprazole 40mg  once daily FOREVER.  4. We will be scheduling you for a colonoscopy in the near future once I have discussed with your family.

## 2017-11-03 NOTE — Assessment & Plan Note (Signed)
67 year old gentleman with history of stroke with residual right-sided hemiparesis, hypertension, history of alcohol/cocaine use currently in remission, chronic renal disease stage IV who presents for follow-up of hospitalization a few months back when he presented with hematemesis, heme positive stool.  Noted decline in hemoglobin at that time.  EGD showed gastritis/duodenitis but not felt to explain the degree of his anemia.  No prior colonoscopy.  It has been recommended that he maintain pantoprazole 40 mg indefinitely.  Currently patient is on Plavix but no aspirin.  No other NSAIDs.  Would offer colonoscopy with deep sedation and patient agreeable.  According to records received from the nursing home he is listed as his on responsible party.  I will contact his sister who is listed as well.  Fully explained process of bowel prep, dietary restrictions preceding colonoscopy as well as a procedure and patient is agreeable at this point pursuing.  I have discussed the risks, alternatives, benefits with regards to but not limited to the risk of reaction to medication, bleeding, infection, perforation and the patient is agreeable to proceed. Written consent to be obtained.  Discussed findings with his sister, Jianni Batten we will schedule him.  In the interim, recheck CBC, iron, TIBC, ferritin.  Check I FOBT.

## 2017-11-09 ENCOUNTER — Ambulatory Visit (INDEPENDENT_AMBULATORY_CARE_PROVIDER_SITE_OTHER): Payer: Medicaid Other | Admitting: Gastroenterology

## 2017-11-09 ENCOUNTER — Encounter: Payer: Self-pay | Admitting: Gastroenterology

## 2017-11-09 DIAGNOSIS — D649 Anemia, unspecified: Secondary | ICD-10-CM

## 2017-11-09 LAB — IFOBT (OCCULT BLOOD): IMMUNOLOGICAL FECAL OCCULT BLOOD TEST: NEGATIVE

## 2017-11-18 NOTE — Progress Notes (Signed)
Called, many rings and no answer.

## 2017-11-19 NOTE — Progress Notes (Signed)
[facility] [address] [place]. [place] [address] [phone]       OFFICE FOLLOW UP NOTE  Mr. Lucas [name] Date of Birth:  05-Oct-1950 Medical Record Number:  [id number]   Reason for Referral:  hospital stroke follow up  CHIEF COMPLAINT:  Chief Complaint  Patient presents with  . Follow-up    Stroke follow up room 9 pt with lois transportation, pt at [facility]    HPI: Lucas [name] is being seen today for initial visit in the office for left lacunar stroke on 05/24/17. History obtained from patient and chart review. Reviewed all radiology images and labs personally.  Lucas [name] is an 67 y.o. male who lives in a hotel with PMH of HTN, HLD, and polysubstance abuse.  Patient came to [facility] ED on 05/24/17 with complaint of right-sided weakness that began a day prior.  Patient admits to having hypertension but not taking his meds as prescribed.  He takes his meds when he thinks he needs him. Hypertensive with blood pressure at 250/82.  Patient had urine drug positive for cocaine however he does not admit to using cocaine.  CT head demonstrated a new lacunar infarct in the left corona radiata and superior basal ganglia.  Patient states usually can use his right side however at this point he cannot use his right side.  MRI of the head showed an acute left thalamus infarct and an acute versus subacute left frontal lobe punctate infarct.  The scan did show old bilateral basal ganglia, thalamus, and pontine lacunar infarcts.  MRA of the head showed no emergent large vessel occlusion but did show severe stenosis in the right ICA cavernous segment and slow flow right MCA with severe stenosis right M2 origin.Bilateral carotid Dopplers showed bilateral stenosis of 1-39%.  2D echo normal without cardiac source of emboli.  LDL 167 and A1c 5.3.  Patient was previously prescribed Lipitor 40 mg but unsure if he was taking this appropriately. It was recommended to continue Lipitor  40 mg.  Patient was prescribed 81 mg PTA but was not taking appropriately.  It was recommended patient take aspirin 325 mg along with Plavix 75 mg for 3 months and then just Plavix daily.  Patient discharged to SNF facility. Recent hospital admission on 06/15/2017 for low-grade fever, generalized weakness and lethargy.  He was giving fluids, and treated for E. coli gram-negative bacteremia.  Patient discharged back to skilled nursing facility. Recent hospital admission on 06/24/2017 for hematemesis.  Underwent EGD which was unrevealing and recommended to continue Protonix twice a day for the next 30 days but also continue aspirin and Plavix.  Recommend colonoscopy in 4 weeks.  Return back to SNF. Patient is being seen today for stroke follow-up he continues to have right-sided hemiparesis as he has been refusing to participate in PT/OT.  He also has been refusing his blood pressure medications and at today's visit blood pressure is 198/82.  Recheck manual blood pressure at 192/82.  He is not symptomatic his he feels fine at this time.  He is continue to take aspirin and Plavix without increase in bleeding or bruising but recommended to be discharged on aspirin 325 but currently taking aspirin 81 mg.  He will continue to take DA PT until 08/23/2017 and then continue Plavix alone.  When questioned about reasoning behind refusing therapy and blood pressure, he states that he feels as though he does not need it.  Stressed the importance of controlling blood pressure  and cholesterol as this will help decrease risk for having an additional stroke and PT/OT to help him recover.  I advised him of the importance of participating in therapy as he can start to develop generalized atrophy and weakness from sitting in a wheelchair.  Also advised him the importance of being compliant with his medications to help control his risk factors such as HLD and HTN and if he continues to refuse these medications, his chances of an  additional stroke are very likely.  Patient states that he does not want to have an additional stroke and will start being compliant with PT/OT and medications.  It seems as though he has been compliant with Lipitor as his LDL currently 82 on 07/07/2017 (see previous 167).  No new or worsening stroke/TIA symptoms.  Interval History 11/22/17: Patient is being seen today for follow-up visit and is accompanied by transportation from facility.  Patient continues to participate and speech and also states participation and physical therapy but per notes from facility, physical therapy is not currently prescribed.  Patient continues to have right hemiparesis along with dysarthria but states this has been improving.  He continues to take Plavix without side effects of bleeding or bruising.  Continues to take Lipitor without side effects myalgias.  Blood pressure today elevated 175/78 but per recent blood pressure at facility 132/70.  Patient is currently sitting in wheelchair but able to ambulate when he feels up to it per transportation worker.  Patient does state compliance with all medications.  Denies new or worsening stroke/TIA symptoms.  ROS:   14 system review of systems performed and negative with exception of weakness, difficulty walking  PMH:  Past Medical History:  Diagnosis Date  . Carotid artery stenosis   . Chronic diastolic heart failure ([place])   . Chronic kidney disease, stage IV (severe) ([place])   . Cognitive deficits   . CVA (cerebral infarction) 11/2010   CT 8/12 : Sm ischemic infarct posterior limb of L internal capsule. Carotids nl. ECHO nl with EF 55-60%. R sided weakness.   . Hyperlipemia   . Hyperlipidemia 01/06/2011   Diagnosed during acute CVA hospitalization. On statin.  Marland Kitchen Hypertension 11/2010   Diagnosed during acute CVA hospitalization  . Occlusion of anterior cerebral artery   . Polysubstance abuse ([place]) 01/06/2011   cocaine, tobacco, THC  . Right hemiplegia ([place])   . Stroke  [facility])     PSH:  Past Surgical History:  Procedure Laterality Date  . ESOPHAGOGASTRODUODENOSCOPY (EGD) WITH PROPOFOL N/A 06/25/2017   Dr. Oneida Alar: small hiatal hernia, gastritis/duodenititis. benign biopsy without H.pylori.     Social History:  Social History   Socioeconomic History  . Marital status: Single    Spouse name: Not on file  . Number of children: Not on file  . Years of education: Not on file  . Highest education level: Not on file  Occupational History  . Not on file  Social Needs  . Financial resource strain: Not on file  . Food insecurity:    Worry: Not on file    Inability: Not on file  . Transportation needs:    Medical: Not on file    Non-medical: Not on file  Tobacco Use  . Smoking status: Current Some Day Smoker    Packs/day: 0.50    Years: 40.00    Pack years: 20.00    Types: Cigarettes    Start date: 01/02/1965  . Smokeless tobacco: Never Used  . Tobacco comment: 2-3 per  week  Substance and Sexual Activity  . Alcohol use: Yes    Alcohol/week: 2.0 standard drinks    Types: 2 Standard drinks or equivalent per week    Comment: beer  . Drug use: Not Currently    Types: Cocaine  . Sexual activity: Never  Lifestyle  . Physical activity:    Days per week: Not on file    Minutes per session: Not on file  . Stress: Not on file  Relationships  . Social connections:    Talks on phone: Not on file    Gets together: Not on file    Attends religious service: Not on file    Active member of club or organization: Not on file    Attends meetings of clubs or organizations: Not on file    Relationship status: Not on file  . Intimate partner violence:    Fear of current or ex partner: Not on file    Emotionally abused: Not on file    Physically abused: Not on file    Forced sexual activity: Not on file  Other Topics Concern  . Not on file  Social History Narrative  . Not on file    Family History:  Family History  Problem Relation Age of Onset  .  Hypertension Mother   . Hypertension Father     Medications:   Current Outpatient Medications on File Prior to Visit  Medication Sig Dispense Refill  . amLODipine (NORVASC) 10 MG tablet Take 1 tablet (10 mg total) by mouth daily. 90 tablet 3  . atorvastatin (LIPITOR) 40 MG tablet Take 1 tablet (40 mg total) by mouth daily at 6 PM.    . cloNIDine (CATAPRES - DOSED IN MG/24 HR) 0.2 mg/24hr patch Place 0.2 mg onto the skin once a week.    . cloNIDine (CATAPRES) 0.2 MG tablet Take 1 tablet (0.2 mg total) by mouth 2 (two) times daily. (Patient taking differently: Take 0.1 mg by mouth 2 (two) times daily. )    . clopidogrel (PLAVIX) 75 MG tablet Take 1 tablet (75 mg total) by mouth daily.    . divalproex (DEPAKOTE) 250 MG DR tablet Take 250 mg by mouth daily.    . hydrALAZINE (APRESOLINE) 100 MG tablet Take 100 mg by mouth 3 (three) times daily.    Marland Kitchen OLANZapine (ZYPREXA) 5 MG tablet Take 5 mg by mouth at bedtime.    . pantoprazole (PROTONIX) 40 MG tablet Take 1 tablet (40 mg total) by mouth 2 (two) times daily before a meal. 30 tablet 1  . sertraline (ZOLOFT) 25 MG tablet Take 25 mg by mouth daily.     . vitamin C (ASCORBIC ACID) 250 MG tablet Take 250 mg by mouth daily.     No current facility-administered medications on file prior to visit.     Allergies:   Allergies  Allergen Reactions  . Pork-Derived Products Other (See Comments)    Patient does not eat pork products.     Physical Exam  Vitals:   11/22/17 1335  BP: (!) 175/78  Pulse: (!) 55   There is no height or weight on file to calculate BMI. No exam data present  General: Frail pleasant elderly African-American male, well nourished, seated, in no evident distress Head: head normocephalic and atraumatic.   Neck: supple with no carotid or supraclavicular bruits Cardiovascular: regular rate and rhythm, no murmurs Musculoskeletal: no deformity Skin:  no rash/petichiae Vascular:  Normal pulses all extremities  Neurologic  Exam Mental  Status: Awake and fully alert. Oriented to place and time. Recent and remote memory intact. Attention span, concentration and fund of knowledge appropriate. Mood and affect appropriate.  Cranial Nerves: Fundoscopic exam reveals sharp disc margins. Pupils equal, briskly reactive to light. Extraocular movements full without nystagmus. Visual fields full to confrontation. Hearing intact. Facial sensation intact. Face, tongue, palate moves normally and symmetrically.  Motor: RUE: 4/5, RLE: 3+/5; full strength in left upper and lower extremity Sensory.: intact to touch , pinprick , position and vibratory sensation.  Coordination: Decreased right finger dexterity.  Left arm orbits over right. Gait and Station: Currently sitting in wheelchair and patient refused gait assessment Reflexes: Brisk and symmetric. Toes downgoing.      Diagnostic Data (Labs, Imaging, Testing)  Ct Head Wo Contrast Result Date: 05/24/2017 IMPRESSION: 1. No evidence of acute large territory infarct or intracranial hemorrhage. 2. New lacunar infarcts in the left corona radiata/superior basal ganglia, possibly acute or subacute. 3. 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    MRI/MRA Brain/Head:   IMPRESSION: MRI HEAD:  1. Acute subcentimeter nonhemorrhagic LEFT thalamus infarct. Acute versus subacute LEFT frontal lobe punctate infarct. 2. Old bilateral basal ganglia and thalamus infarcts. Old pontine lacunar infarcts. 3. Moderate chronic small vessel ischemic disease. 4. Moderate parenchymal brain volume loss.  MRA HEAD:  1. No emergent large vessel occlusion. 2. Severe stenosis RIGHT ICA cavernous segment. Slow flow RIGHT MCA with severe stenosis RIGHT M2 origin.   Echocardiogram:                                    Study Conclusions  - Left ventricle: The cavity size was normal. Wall thickness was increased in a pattern of moderate LVH. Systolic  function was normal. The estimated ejection fraction was in the range of 55% to 60%. Wall motion was normal; there were no regional wall motion abnormalities. Features are consistent with a pseudonormal left ventricular filling pattern, with concomitant abnormal relaxation and increased filling pressure (grade 2 diastolic dysfunction). - Aortic valve: Trileaflet; mildly thickened, mildly calcified leaflets. There was mild regurgitation. - Mitral valve: Calcified annulus. There was mild regurgitation. - Left atrium: The atrium was moderately dilated. Volume/bsa, ES, (1-plane Simpson&'s, A2C): 44.4 ml/m^2. - Tricuspid valve: There was mild regurgitation. - Pulmonary arteries: Systolic pressure was mildly increased. Impressions:- No cardiac source of emboli was indentified.   B/L Carotid U/S:  1-39% ICA stenosis       ASSESSMENT: Lucas [name] is a 67 y.o. year old male here with left lacunar infarct on 05/24/2017 secondary to polysubstance abuse and small vessel disease. Vascular risk factors include HTN, HLD, and polysubstance abuse.  Patient returns today for follow-up visit and continues to have residual right hemiparesis and dysarthria but per patient this has been improving.    PLAN: -Continue clopidogrel 75 mg daily  and Lipitor for secondary stroke prevention -F/u with PCP regarding your HLD and HTN management -be compliant with medications and therapies -Continue current therapies at facility and increase activity level -Maintain strict control of hypertension with blood pressure goal below 130/90, diabetes with hemoglobin A1c goal below 6.5% and cholesterol with LDL cholesterol (bad cholesterol) goal below 70 mg/dL. I also advised the patient to eat a healthy diet with plenty of whole grains, cereals, fruits and vegetables, exercise regularly and maintain ideal body weight.  Follow up in 6 months or call earlier if  needed  Greater than 50% time during  this 25 minute consultation visit was spent on counseling and coordination of care about HTN, and HLD, discussion about risk benefit of anticoagulation and answering questions.   Venancio Poisson, AGNP-BC  [facility] [address] [place], [place] [address]  Phone [phone] Fax [phone]

## 2017-11-22 ENCOUNTER — Encounter: Payer: Self-pay | Admitting: Adult Health

## 2017-11-22 ENCOUNTER — Ambulatory Visit (INDEPENDENT_AMBULATORY_CARE_PROVIDER_SITE_OTHER): Payer: Medicaid Other | Admitting: Adult Health

## 2017-11-22 ENCOUNTER — Telehealth: Payer: Self-pay | Admitting: Gastroenterology

## 2017-11-22 VITALS — BP 175/78 | HR 55

## 2017-11-22 DIAGNOSIS — I6381 Other cerebral infarction due to occlusion or stenosis of small artery: Secondary | ICD-10-CM | POA: Diagnosis not present

## 2017-11-22 DIAGNOSIS — I10 Essential (primary) hypertension: Secondary | ICD-10-CM | POA: Diagnosis not present

## 2017-11-22 DIAGNOSIS — E785 Hyperlipidemia, unspecified: Secondary | ICD-10-CM | POA: Diagnosis not present

## 2017-11-22 NOTE — Telephone Encounter (Signed)
Still waiting for labs ordered on November 03, 2017.  Likely done at the nursing home.  Multiple attempts by our nurse to get in touch with the nursing home without success.  Tried to call patient's family regarding scheduling colonoscopy.  I spoke with Liz Beach, patient's sister, who reports her daughter (patient's niece) manages patient's health decisions. I tried calling her Bonney Leitz) at cell [phone] which was the number provided by the nursing home.  Voicemail not set up.  Also contacted the number we had listed at [phone] and again no voicemail available.  Will try again.   PLEASE CONTINUE TO TRY AND GET IN [facility] WITH NURSING HOME ABOUT THE LABS I ORDERED.

## 2017-11-22 NOTE — Patient Instructions (Signed)
Continue clopidogrel 75 mg daily  and lipitor  for secondary stroke prevention  Continue to follow up with PCP regarding cholesterol and blood pressure management   Continue PT/ST at current facility  Continue to monitor blood pressure at home  Maintain strict control of hypertension with blood pressure goal below 130/90, diabetes with hemoglobin A1c goal below 6.5% and cholesterol with LDL cholesterol (bad cholesterol) goal below 70 mg/dL. I also advised the patient to eat a healthy diet with plenty of whole grains, cereals, fruits and vegetables, exercise regularly and maintain ideal body weight.  Followup in the future with me in 6 months or call earlier if needed       Thank you for coming to see [place] at [facility]. I hope we have been able to provide you high quality care today.  You may receive a patient satisfaction survey over the next few weeks. We would appreciate your feedback and comments so that we may continue to improve ourselves and the health of our patients.

## 2017-11-22 NOTE — Telephone Encounter (Signed)
Tried calling the nursing home, reception transferred call and no one answered call after 20 rings. Will call back.

## 2017-11-25 NOTE — Progress Notes (Signed)
I agree with the above plan 

## 2017-11-26 NOTE — Telephone Encounter (Signed)
Spoke with nurse. Lab orders are being faxed over. Iron/ferritin/tibc is being drawn on pt.

## 2017-12-01 NOTE — Telephone Encounter (Signed)
noted 

## 2017-12-01 NOTE — Telephone Encounter (Signed)
Lab results received for Iron/TIBC. Placed in [place] office for review.

## 2017-12-07 ENCOUNTER — Other Ambulatory Visit: Payer: Self-pay | Admitting: *Deleted

## 2017-12-07 DIAGNOSIS — R195 Other fecal abnormalities: Secondary | ICD-10-CM

## 2017-12-07 DIAGNOSIS — D649 Anemia, unspecified: Secondary | ICD-10-CM

## 2017-12-07 MED ORDER — PEG 3350-KCL-NA BICARB-NACL 420 G PO SOLR: 4000.0000 mL | Freq: Once | ORAL | 0 refills | Status: AC

## 2017-12-07 NOTE — Telephone Encounter (Addendum)
Pre-op scheduled for 01/07/18 at [facility] and she is aware of appt details. She reports fax # for [facility] is [phone]. Rx for prep sent. Prep instructions faxed. I tried to call patient's niece and sister but still unable to get through.

## 2017-12-07 NOTE — Telephone Encounter (Signed)
[facility] home and they are going to check to see if ferritin was done. They are aware that ferritin and cbc were needed and pt will need those labs done if they weren't drawn.

## 2017-12-07 NOTE — Telephone Encounter (Signed)
I received the iron/tibc. Iron low 209, iron sat 30.   I did not receive the ferritin or the CBC I ordered.     Please schedule TCS with propofol with RMR  Dx: anemia, heme + stool.  I tried to call patient's niece per his sister's request but unable to get in touch with her. Please contact Bonney Leitz [phone] or [phone]) to let her know plan.  I will need CBC done NOW unless his procedure is in the next 1-2 weeks.

## 2017-12-07 NOTE — Telephone Encounter (Addendum)
Called #'s for niece, one line states they are currently not accepting calls, other line has no VM set up. Called sister # and no answer and VM not set up. I called [facility]	[place] and spoke with [place]. TCS w/ [facility] is scheduled for 01/13/18 at 8:30am. She asked for me to fax prep instructions to her at [phone]. Rx needs to be sent to [facility]. I will call her back about pre-op appointment

## 2017-12-07 NOTE — Addendum Note (Signed)
Addended by: Inge Rise on: 12/07/2017 01:55 PM   Modules accepted: Orders

## 2017-12-08 NOTE — Telephone Encounter (Signed)
I called [facility] and spoke with the nurse taking care of Mr. Mcquillen to inquire about consent.  He stated I needed to speak with Lamount Cohen in regards to this issue.

## 2017-12-08 NOTE — Telephone Encounter (Addendum)
Thanks for the effort. Someone will need to make sure an appropriate party is available to sign consent or get verbal consent over the phone.   Patient is alert and orient to person/place when I saw him but probably should have someone else do the official consent.   Routing to CM in case additional help is needed!

## 2017-12-08 NOTE — Telephone Encounter (Signed)
Attempted to call niece/sister, still unable to get through

## 2017-12-09 NOTE — Telephone Encounter (Signed)
I confirmed with [phone]) that the patient can consent for himself, however she is going to check with their social worker & the patient in regards to consent for colonoscopy.   We can also reach out to his sister Lucas [name] at [phone].

## 2017-12-14 NOTE — Telephone Encounter (Signed)
I called to follow-up with Lucas [name], however I was told she was busy and she will return my call later.

## 2017-12-14 NOTE — Telephone Encounter (Signed)
I spoke with Lamount Cohen and she stated that Mr. Cabiness said "he is not having a colonoscopy".    She said they had a meeting with the Director of Nursing Social Worker and the patient and he still stated "he does not want to have a coloscopy".  Routing to [employer]

## 2017-12-16 ENCOUNTER — Other Ambulatory Visit: Payer: Self-pay

## 2017-12-16 DIAGNOSIS — D649 Anemia, unspecified: Secondary | ICD-10-CM

## 2017-12-16 NOTE — Telephone Encounter (Addendum)
Noted. Cancel TCS if patient not consenting.   Would advise CBC to follow up on anemia.   Routing to CM since information regarding refusing tcs came from her. Routing to AM to arrange for CBC.

## 2017-12-16 NOTE — Telephone Encounter (Signed)
Routing to the clinical pool to cancel the patient's procedure.

## 2017-12-16 NOTE — Telephone Encounter (Signed)
Noted. Lab orders for CBC were faxed to Dor[facility] per [place] at [phone].

## 2017-12-16 NOTE — Telephone Encounter (Signed)
Called carolyn and procedure has been cancelled.

## 2017-12-20 NOTE — Telephone Encounter (Addendum)
Lucas [name] called from [facility], she said she has asked the patient daily to allow her to draw the cbc and he has refused to let her do it. She said she didn't want LSL to think she wasn't trying to get it.

## 2017-12-22 NOTE — Telephone Encounter (Signed)
Patient deemed to be able to consent for himselft per documentation. He continues to refuse care.

## 2018-01-07 ENCOUNTER — Other Ambulatory Visit (HOSPITAL_COMMUNITY): Payer: Medicaid Other

## 2018-01-13 ENCOUNTER — Encounter (HOSPITAL_COMMUNITY): Payer: Self-pay

## 2018-01-13 ENCOUNTER — Ambulatory Visit (HOSPITAL_COMMUNITY): Admit: 2018-01-13 | Payer: Medicaid Other | Admitting: Internal Medicine

## 2018-01-13 SURGERY — COLONOSCOPY WITH PROPOFOL
Anesthesia: Monitor Anesthesia Care

## 2018-01-31 DIAGNOSIS — I6601 Occlusion and stenosis of right middle cerebral artery: Secondary | ICD-10-CM | POA: Diagnosis not present

## 2018-01-31 DIAGNOSIS — I10 Essential (primary) hypertension: Secondary | ICD-10-CM | POA: Diagnosis not present

## 2018-01-31 DIAGNOSIS — F0281 Dementia in other diseases classified elsewhere with behavioral disturbance: Secondary | ICD-10-CM | POA: Diagnosis not present

## 2018-01-31 DIAGNOSIS — F330 Major depressive disorder, recurrent, mild: Secondary | ICD-10-CM | POA: Diagnosis not present

## 2018-02-03 DIAGNOSIS — I69391 Dysphagia following cerebral infarction: Secondary | ICD-10-CM | POA: Diagnosis not present

## 2018-02-03 DIAGNOSIS — I639 Cerebral infarction, unspecified: Secondary | ICD-10-CM | POA: Diagnosis not present

## 2018-02-03 DIAGNOSIS — R2689 Other abnormalities of gait and mobility: Secondary | ICD-10-CM | POA: Diagnosis not present

## 2018-02-03 DIAGNOSIS — I5032 Chronic diastolic (congestive) heart failure: Secondary | ICD-10-CM | POA: Diagnosis not present

## 2018-02-17 DIAGNOSIS — I739 Peripheral vascular disease, unspecified: Secondary | ICD-10-CM | POA: Diagnosis not present

## 2018-02-17 DIAGNOSIS — B351 Tinea unguium: Secondary | ICD-10-CM | POA: Diagnosis not present

## 2018-02-17 DIAGNOSIS — I10 Essential (primary) hypertension: Secondary | ICD-10-CM | POA: Diagnosis not present

## 2018-02-17 DIAGNOSIS — F330 Major depressive disorder, recurrent, mild: Secondary | ICD-10-CM | POA: Diagnosis not present

## 2018-02-17 DIAGNOSIS — K5900 Constipation, unspecified: Secondary | ICD-10-CM | POA: Diagnosis not present

## 2018-02-23 DIAGNOSIS — I639 Cerebral infarction, unspecified: Secondary | ICD-10-CM | POA: Diagnosis not present

## 2018-02-24 DIAGNOSIS — I639 Cerebral infarction, unspecified: Secondary | ICD-10-CM | POA: Diagnosis not present

## 2018-02-25 DIAGNOSIS — F0151 Vascular dementia with behavioral disturbance: Secondary | ICD-10-CM | POA: Diagnosis not present

## 2018-02-25 DIAGNOSIS — F062 Psychotic disorder with delusions due to known physiological condition: Secondary | ICD-10-CM | POA: Diagnosis not present

## 2018-02-25 DIAGNOSIS — F331 Major depressive disorder, recurrent, moderate: Secondary | ICD-10-CM | POA: Diagnosis not present

## 2018-02-25 DIAGNOSIS — I639 Cerebral infarction, unspecified: Secondary | ICD-10-CM | POA: Diagnosis not present

## 2018-02-28 DIAGNOSIS — I639 Cerebral infarction, unspecified: Secondary | ICD-10-CM | POA: Diagnosis not present

## 2018-03-01 DIAGNOSIS — E785 Hyperlipidemia, unspecified: Secondary | ICD-10-CM | POA: Diagnosis not present

## 2018-03-01 DIAGNOSIS — R2689 Other abnormalities of gait and mobility: Secondary | ICD-10-CM | POA: Diagnosis not present

## 2018-03-01 DIAGNOSIS — B351 Tinea unguium: Secondary | ICD-10-CM | POA: Diagnosis not present

## 2018-03-01 DIAGNOSIS — I639 Cerebral infarction, unspecified: Secondary | ICD-10-CM | POA: Diagnosis not present

## 2018-03-01 DIAGNOSIS — I739 Peripheral vascular disease, unspecified: Secondary | ICD-10-CM | POA: Diagnosis not present

## 2018-03-02 DIAGNOSIS — I639 Cerebral infarction, unspecified: Secondary | ICD-10-CM | POA: Diagnosis not present

## 2018-03-03 DIAGNOSIS — I639 Cerebral infarction, unspecified: Secondary | ICD-10-CM | POA: Diagnosis not present

## 2018-03-04 DIAGNOSIS — I639 Cerebral infarction, unspecified: Secondary | ICD-10-CM | POA: Diagnosis not present

## 2018-03-06 DIAGNOSIS — I639 Cerebral infarction, unspecified: Secondary | ICD-10-CM | POA: Diagnosis not present

## 2018-03-07 DIAGNOSIS — I639 Cerebral infarction, unspecified: Secondary | ICD-10-CM | POA: Diagnosis not present

## 2018-03-08 DIAGNOSIS — I639 Cerebral infarction, unspecified: Secondary | ICD-10-CM | POA: Diagnosis not present

## 2018-03-11 DIAGNOSIS — I639 Cerebral infarction, unspecified: Secondary | ICD-10-CM | POA: Diagnosis not present

## 2018-03-12 DIAGNOSIS — I639 Cerebral infarction, unspecified: Secondary | ICD-10-CM | POA: Diagnosis not present

## 2018-03-16 DIAGNOSIS — I639 Cerebral infarction, unspecified: Secondary | ICD-10-CM | POA: Diagnosis not present

## 2018-03-17 DIAGNOSIS — I5032 Chronic diastolic (congestive) heart failure: Secondary | ICD-10-CM | POA: Diagnosis not present

## 2018-03-17 DIAGNOSIS — I69391 Dysphagia following cerebral infarction: Secondary | ICD-10-CM | POA: Diagnosis not present

## 2018-03-17 DIAGNOSIS — R4586 Emotional lability: Secondary | ICD-10-CM | POA: Diagnosis not present

## 2018-03-17 DIAGNOSIS — F0281 Dementia in other diseases classified elsewhere with behavioral disturbance: Secondary | ICD-10-CM | POA: Diagnosis not present

## 2018-03-17 DIAGNOSIS — I639 Cerebral infarction, unspecified: Secondary | ICD-10-CM | POA: Diagnosis not present

## 2018-03-19 DIAGNOSIS — I639 Cerebral infarction, unspecified: Secondary | ICD-10-CM | POA: Diagnosis not present

## 2018-03-22 DIAGNOSIS — I639 Cerebral infarction, unspecified: Secondary | ICD-10-CM | POA: Diagnosis not present

## 2018-03-23 DIAGNOSIS — I639 Cerebral infarction, unspecified: Secondary | ICD-10-CM | POA: Diagnosis not present

## 2018-03-24 DIAGNOSIS — F062 Psychotic disorder with delusions due to known physiological condition: Secondary | ICD-10-CM | POA: Diagnosis not present

## 2018-03-24 DIAGNOSIS — I639 Cerebral infarction, unspecified: Secondary | ICD-10-CM | POA: Diagnosis not present

## 2018-03-24 DIAGNOSIS — F331 Major depressive disorder, recurrent, moderate: Secondary | ICD-10-CM | POA: Diagnosis not present

## 2018-03-24 DIAGNOSIS — F0151 Vascular dementia with behavioral disturbance: Secondary | ICD-10-CM | POA: Diagnosis not present

## 2018-03-25 DIAGNOSIS — I639 Cerebral infarction, unspecified: Secondary | ICD-10-CM | POA: Diagnosis not present

## 2018-03-26 DIAGNOSIS — I639 Cerebral infarction, unspecified: Secondary | ICD-10-CM | POA: Diagnosis not present

## 2018-03-29 DIAGNOSIS — I639 Cerebral infarction, unspecified: Secondary | ICD-10-CM | POA: Diagnosis not present

## 2018-03-29 DIAGNOSIS — Z79899 Other long term (current) drug therapy: Secondary | ICD-10-CM | POA: Diagnosis not present

## 2018-03-30 DIAGNOSIS — I639 Cerebral infarction, unspecified: Secondary | ICD-10-CM | POA: Diagnosis not present

## 2018-03-31 DIAGNOSIS — I639 Cerebral infarction, unspecified: Secondary | ICD-10-CM | POA: Diagnosis not present

## 2018-04-01 DIAGNOSIS — I639 Cerebral infarction, unspecified: Secondary | ICD-10-CM | POA: Diagnosis not present

## 2018-04-02 DIAGNOSIS — I639 Cerebral infarction, unspecified: Secondary | ICD-10-CM | POA: Diagnosis not present

## 2018-04-04 DIAGNOSIS — I639 Cerebral infarction, unspecified: Secondary | ICD-10-CM | POA: Diagnosis not present

## 2018-04-05 DIAGNOSIS — I639 Cerebral infarction, unspecified: Secondary | ICD-10-CM | POA: Diagnosis not present

## 2018-04-07 DIAGNOSIS — I639 Cerebral infarction, unspecified: Secondary | ICD-10-CM | POA: Diagnosis not present

## 2018-04-08 DIAGNOSIS — I639 Cerebral infarction, unspecified: Secondary | ICD-10-CM | POA: Diagnosis not present

## 2018-04-09 DIAGNOSIS — I639 Cerebral infarction, unspecified: Secondary | ICD-10-CM | POA: Diagnosis not present

## 2018-04-12 DIAGNOSIS — I639 Cerebral infarction, unspecified: Secondary | ICD-10-CM | POA: Diagnosis not present

## 2018-04-13 DIAGNOSIS — I639 Cerebral infarction, unspecified: Secondary | ICD-10-CM | POA: Diagnosis not present

## 2018-04-15 DIAGNOSIS — I639 Cerebral infarction, unspecified: Secondary | ICD-10-CM | POA: Diagnosis not present

## 2018-04-16 ENCOUNTER — Emergency Department (HOSPITAL_COMMUNITY)
Admission: EM | Admit: 2018-04-16 | Discharge: 2018-04-17 | Disposition: A | Discharge status: Skilled Nursing Facility | Payer: Medicare Other | Attending: Emergency Medicine | Admitting: Emergency Medicine

## 2018-04-16 ENCOUNTER — Other Ambulatory Visit: Payer: Self-pay

## 2018-04-16 ENCOUNTER — Encounter (HOSPITAL_COMMUNITY): Payer: Self-pay | Admitting: Emergency Medicine

## 2018-04-16 DIAGNOSIS — F0390 Unspecified dementia without behavioral disturbance: Secondary | ICD-10-CM | POA: Insufficient documentation

## 2018-04-16 DIAGNOSIS — F17210 Nicotine dependence, cigarettes, uncomplicated: Secondary | ICD-10-CM | POA: Insufficient documentation

## 2018-04-16 DIAGNOSIS — Y92129 Unspecified place in nursing home as the place of occurrence of the external cause: Secondary | ICD-10-CM

## 2018-04-16 DIAGNOSIS — Z7902 Long term (current) use of antithrombotics/antiplatelets: Secondary | ICD-10-CM | POA: Insufficient documentation

## 2018-04-16 DIAGNOSIS — S06360A Traumatic hemorrhage of cerebrum, unspecified, without loss of consciousness, initial encounter: Secondary | ICD-10-CM | POA: Diagnosis not present

## 2018-04-16 DIAGNOSIS — Z79899 Other long term (current) drug therapy: Secondary | ICD-10-CM | POA: Diagnosis not present

## 2018-04-16 DIAGNOSIS — Y92122 Bedroom in nursing home as the place of occurrence of the external cause: Secondary | ICD-10-CM | POA: Diagnosis not present

## 2018-04-16 DIAGNOSIS — Y9389 Activity, other specified: Secondary | ICD-10-CM | POA: Insufficient documentation

## 2018-04-16 DIAGNOSIS — I130 Hypertensive heart and chronic kidney disease with heart failure and stage 1 through stage 4 chronic kidney disease, or unspecified chronic kidney disease: Secondary | ICD-10-CM | POA: Diagnosis not present

## 2018-04-16 DIAGNOSIS — Y999 Unspecified external cause status: Secondary | ICD-10-CM | POA: Diagnosis not present

## 2018-04-16 DIAGNOSIS — S199XXA Unspecified injury of neck, initial encounter: Secondary | ICD-10-CM | POA: Diagnosis not present

## 2018-04-16 DIAGNOSIS — W19XXXA Unspecified fall, initial encounter: Secondary | ICD-10-CM

## 2018-04-16 DIAGNOSIS — M79671 Pain in right foot: Secondary | ICD-10-CM | POA: Insufficient documentation

## 2018-04-16 DIAGNOSIS — W06XXXA Fall from bed, initial encounter: Secondary | ICD-10-CM | POA: Insufficient documentation

## 2018-04-16 DIAGNOSIS — I5032 Chronic diastolic (congestive) heart failure: Secondary | ICD-10-CM | POA: Diagnosis not present

## 2018-04-16 DIAGNOSIS — S0083XA Contusion of other part of head, initial encounter: Secondary | ICD-10-CM

## 2018-04-16 DIAGNOSIS — N184 Chronic kidney disease, stage 4 (severe): Secondary | ICD-10-CM | POA: Insufficient documentation

## 2018-04-16 DIAGNOSIS — S0990XA Unspecified injury of head, initial encounter: Secondary | ICD-10-CM | POA: Diagnosis present

## 2018-04-16 DIAGNOSIS — S99921A Unspecified injury of right foot, initial encounter: Secondary | ICD-10-CM | POA: Diagnosis not present

## 2018-04-16 HISTORY — DX: Peripheral vascular disease, unspecified: I73.9

## 2018-04-16 NOTE — ED Triage Notes (Signed)
Pt stumbled and fell, hit left side of head. Has a small hematoma above left temple.

## 2018-04-17 ENCOUNTER — Emergency Department (HOSPITAL_COMMUNITY): Payer: Medicare Other

## 2018-04-17 DIAGNOSIS — M79671 Pain in right foot: Secondary | ICD-10-CM | POA: Diagnosis not present

## 2018-04-17 DIAGNOSIS — R404 Transient alteration of awareness: Secondary | ICD-10-CM | POA: Diagnosis not present

## 2018-04-17 DIAGNOSIS — S0083XA Contusion of other part of head, initial encounter: Secondary | ICD-10-CM | POA: Diagnosis not present

## 2018-04-17 DIAGNOSIS — R279 Unspecified lack of coordination: Secondary | ICD-10-CM | POA: Diagnosis not present

## 2018-04-17 DIAGNOSIS — S06360A Traumatic hemorrhage of cerebrum, unspecified, without loss of consciousness, initial encounter: Secondary | ICD-10-CM | POA: Diagnosis not present

## 2018-04-17 DIAGNOSIS — S199XXA Unspecified injury of neck, initial encounter: Secondary | ICD-10-CM | POA: Diagnosis not present

## 2018-04-17 DIAGNOSIS — S99921A Unspecified injury of right foot, initial encounter: Secondary | ICD-10-CM | POA: Diagnosis not present

## 2018-04-17 DIAGNOSIS — Z7401 Bed confinement status: Secondary | ICD-10-CM | POA: Diagnosis not present

## 2018-04-17 NOTE — Discharge Instructions (Addendum)
Recheck for any problems on the head injury sheet. He can have acetaminophen for pain if needed.

## 2018-04-17 NOTE — ED Provider Notes (Signed)
[facility] EMERGENCY DEPARTMENT Provider Note   CSN: [id number] Arrival date & time: 04/16/18  2314  Time seen 12:07 AM   History   Chief Complaint Chief Complaint  Patient presents with  . Fall    Level 5 caveat for dementia  HPI Lucas [name] is a 68 y.o. male.  HPI patient presents from his nursing facility after patient fell and hit the left side of his head.  He complains to me of a contusion of his mid forehead.  There is no documented loss of consciousness.  He denies any neck pain.  His only other complaint is pain in his right foot.  When I look at it however he has a bandage on the right side of his foot from an old wound.  He admits that this is old and nothing new from the fall tonight.  PCP [name], MD  Patient is DO NOT RESUSCITATE  Past Medical History:  Diagnosis Date  . Carotid artery stenosis   . Chronic diastolic heart failure ([place])   . Chronic kidney disease, stage IV (severe) ([place])   . Cognitive deficits   . CVA (cerebral infarction) 11/2010   CT 8/12 : Sm ischemic infarct posterior limb of L internal capsule. Carotids nl. ECHO nl with EF 55-60%. R sided weakness.   . Hyperlipemia   . Hyperlipidemia 01/06/2011   Diagnosed during acute CVA hospitalization. On statin.  Marland Kitchen Hypertension 11/2010   Diagnosed during acute CVA hospitalization  . Occlusion of anterior cerebral artery   . Polysubstance abuse ([place]) 01/06/2011   cocaine, tobacco, THC  . PVD (peripheral vascular disease) ([place])   . Right hemiplegia ([place])   . Stroke [facility])     Patient Active Problem List   Diagnosis Date Noted  . Anemia 11/03/2017  . UGI bleed 06/24/2017  . E coli bacteremia 06/17/2017  . Altered mental status 06/15/2017  . Pressure injury of skin 06/15/2017  . AKI (acute kidney injury) ([place]) 06/15/2017  . CKD (chronic kidney disease) stage 4, GFR 15-29 ml/min (H[facility]) 06/15/2017  . History of stroke 06/15/2017  . Acute lower UTI 06/15/2017  . Dehydration  06/15/2017  . Cocaine abuse ([place])   . Noncompliance w/medication treatment due to intermit use of medication   . Middle cerebral artery stenosis, right   . Stenosis of right carotid artery   . Cerebrovascular accident (CVA) ([place])   . 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 Smoker 06/12/2014  . Memory loss or impairment 02/13/2013  . Right shoulder pain 02/21/2011  . HTN (hypertension) 01/06/2011  . Hyperlipidemia with target LDL less than 100 01/06/2011  . Polysubstance abuse ([place]) 01/06/2011  . CVA (cerebral infarction) 01/06/2011  . Preventative health care 01/06/2011    Past Surgical History:  Procedure Laterality Date  . ESOPHAGOGASTRODUODENOSCOPY (EGD) WITH PROPOFOL N/A 06/25/2017   Dr. Oneida Alar: small hiatal hernia, gastritis/duodenititis. benign biopsy without H.pylori.         Home Medications    Prior to Admission medications   Medication Sig Start Date End Date Taking? Authorizing Provider  amLODipine (NORVASC) 10 MG tablet Take 1 tablet (10 mg total) by mouth daily. 06/12/14   Funches, Adriana Mccallum, MD  atorvastatin (LIPITOR) 40 MG tablet Take 1 tablet (40 mg total) by mouth daily at 6 PM. 06/02/17   Patrecia Pour, Christean Grief, MD  cloNIDine (CATAPRES - DOSED IN MG/24 HR) 0.2 mg/24hr patch Place 0.2 mg onto the skin once a week.    [provider]  cloNIDine (CATAPRES)  0.2 MG tablet Take 1 tablet (0.2 mg total) by mouth 2 (two) times daily. Patient taking differently: Take 0.1 mg by mouth 2 (two) times daily.  06/02/17   Doreatha Lew, MD  clopidogrel (PLAVIX) 75 MG tablet Take 1 tablet (75 mg total) by mouth daily. 06/03/17   Patrecia Pour, Christean Grief, MD  divalproex (DEPAKOTE) 250 MG DR tablet Take 250 mg by mouth daily.    [provider]  hydrALAZINE (APRESOLINE) 100 MG tablet Take 100 mg by mouth 3 (three) times daily.    [provider]  OLANZapine (ZYPREXA) 5 MG tablet Take 5 mg by mouth at bedtime.    [provider]  pantoprazole (PROTONIX) 40  MG tablet Take 1 tablet (40 mg total) by mouth 2 (two) times daily before a meal. 06/25/17 06/25/18  Kathie Dike, MD  sertraline (ZOLOFT) 25 MG tablet Take 25 mg by mouth daily.     [provider]  vitamin C (ASCORBIC ACID) 250 MG tablet Take 250 mg by mouth daily.    [provider]    Family History Family History  Problem Relation Age of Onset  . Hypertension Mother   . Hypertension Father     Social History Social History   Tobacco Use  . Smoking status: Current Some Day Smoker    Packs/day: 0.50    Years: 40.00    Pack years: 20.00    Types: Cigarettes    Start date: 01/02/1965  . Smokeless tobacco: Never Used  . Tobacco comment: 2-3 per week  Substance Use Topics  . Alcohol use: Yes    Alcohol/week: 2.0 standard drinks    Types: 2 Standard drinks or equivalent per week    Comment: beer  . Drug use: Not Currently    Types: Cocaine  Lives in a nursing home   Allergies   Pork-derived products   Review of Systems Review of Systems  All other systems reviewed and are negative.    Physical Exam Updated Vital Signs BP (!) 175/73 (BP Location: Left Arm)   Pulse 69   Temp 98.1 F (36.7 C) (Oral)   Resp 13   Ht 5\' 5"  (1.651 m)   Wt 59 kg   SpO2 99%   BMI 21.63 kg/m   Vital signs normal except for hypertension  Physical Exam Vitals signs and nursing note reviewed.  Constitutional:      Appearance: Normal appearance.  HENT:     Head: Normocephalic.     Comments: Patient has a swollen prominent area in his left scalp however it feels bony and chronic.  He does have a round small area of swelling with some faint redness and superficial abrasion in the center of his forehead near the scalp line that appears to be from the fall tonight.    Right Ear: External ear normal.     Left Ear: External ear normal.     Nose: Nose normal.     Mouth/Throat:     Mouth: Mucous membranes are dry.  Eyes:     Extraocular Movements: Extraocular  movements intact.     Conjunctiva/sclera: Conjunctivae normal.     Pupils: Pupils are equal, round, and reactive to light.  Neck:     Musculoskeletal: Normal range of motion and neck supple. No muscular tenderness.  Cardiovascular:     Rate and Rhythm: Normal rate.  Pulmonary:     Effort: Pulmonary effort is normal. No respiratory distress.  Musculoskeletal:  General: No deformity.     Comments: Patient denies pain with range of motion of his extremities except for his right foot.  He does have some mild swelling of the right foot and some mild redness.  There is a bandage on the lateral aspect of the right foot.  Skin:    General: Skin is warm and dry.  Neurological:     Mental Status: He is alert. Mental status is at baseline.  Psychiatric:        Mood and Affect: Mood normal.        Behavior: Behavior normal.        Thought Content: Thought content normal.      ED Treatments / Results  Labs (all labs ordered are listed, but only abnormal results are displayed) Labs Reviewed - No data to display  EKG None  Radiology Ct Head Wo Contrast  Ct Cervical Spine Wo Contrast  Result Date: 04/17/2018 CLINICAL DATA:  Dementia patient fell out of bed striking head. Left temporal hematoma. EXAM: CT HEAD WITHOUT CONTRAST CT CERVICAL SPINE WITHOUT CONTRAST TECHNIQUE: Multidetector CT imaging of the head and cervical spine was performed following the standard protocol without intravenous contrast. Multiplanar CT image reconstructions of the cervical spine were also generated. COMPARISON:  Head CT 06/15/2017 FINDINGS: CT HEAD FINDINGS Brain: Unchanged atrophy and chronic small vessel ischemia. Again seen remote lacunar infarcts in bilateral basal ganglia. No intracranial hemorrhage, mass effect, or midline shift. No hydrocephalus. The basilar cisterns are patent. No evidence of territorial infarct or acute ischemia. No extra-axial or intracranial fluid collection. Vascular: Atherosclerosis  of skullbase vasculature without hyperdense vessel or abnormal calcification. Skull: No fracture or focal lesion. Sinuses/Orbits: Paranasal sinuses and mastoid air cells are clear. The visualized orbits are unremarkable. Other: Small left temporal scalp lipoma. CT CERVICAL SPINE FINDINGS Alignment: Reversal of normal cervical lordosis. No traumatic subluxation. Skull base and vertebrae: No acute fracture. Vertebral body heights are maintained. The dens and skull base are intact. 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 Soft tissues and spinal canal: No prevertebral fluid or swelling. No visible canal hematoma. Disc levels: Diffuse disc space narrowing and endplate spurring. 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 Posterior disc osteophyte complex at C6-C7. Mild spinal canal narrowing at C5-C6 and C6-C7. Scattered facet arthropathy. Upper chest: Negative. Other: Carotid calcifications. IMPRESSION: 1. No acute intracranial abnormality. No skull fracture. Stable atrophy and chronic ischemia. 2. Multilevel degenerative change in the cervical spine without acute fracture or subluxation. 3. Carotid and skullbase atherosclerosis. Electronically Signed   By: Keith Rake M.D.   On: 04/17/2018 01:04   Dg Foot Complete Right  Result Date: 04/17/2018 CLINICAL DATA:  Status post fall. Acute onset of right foot pain. Initial encounter. EXAM: RIGHT FOOT COMPLETE - 3+ VIEW COMPARISON:  Right tibia/fibula radiographs performed 06/27/2006 FINDINGS: There is no evidence of fracture or dislocation. The joint spaces are preserved. There is no evidence of talar subluxation; the subtalar joint is unremarkable in appearance. Mild degenerative change is suggested about the midfoot. Scattered vascular calcifications are seen. IMPRESSION: 1. No evidence of fracture or dislocation. 2. Scattered vascular calcifications seen. Electronically Signed   By: Garald Balding M.D.   On: 04/17/2018  00:48    Procedures Procedures (including critical care time)  Medications Ordered in ED Medications - No data to display   Initial Impression / Assessment and Plan / ED Course  I have reviewed the triage vital signs and the nursing notes.  Pertinent labs & imaging results that were available during my care of the patient were reviewed by me and considered in my medical decision making (see chart for details).      Due to patient's fall at nursing home and him being on Plavix CT of the head and cervical spine were done.  I also x-rayed his right foot to make sure there was no signs of osteomyelitis.  However it has already been getting wound care at his facility.  Patient was requesting some "cold water" to drink, he was asked to wait until he had a CT done.  After reviewing his CT he was allowed to drink.  Patient was discharged back to his facility.   Final Clinical Impressions(s) / ED Diagnoses   Final diagnoses:  Fall at nursing home, initial encounter  Contusion of forehead, initial encounter    ED Discharge Orders    None    OTC acetaminophen  Plan discharge  Rolland Porter, MD, Barbette Or, MD 04/17/18 [phone]

## 2018-04-17 NOTE — ED Notes (Signed)
Pt took po's with out any problem

## 2018-04-19 DIAGNOSIS — H2513 Age-related nuclear cataract, bilateral: Secondary | ICD-10-CM | POA: Diagnosis not present

## 2018-04-19 DIAGNOSIS — I639 Cerebral infarction, unspecified: Secondary | ICD-10-CM | POA: Diagnosis not present

## 2018-04-20 DIAGNOSIS — I639 Cerebral infarction, unspecified: Secondary | ICD-10-CM | POA: Diagnosis not present

## 2018-04-21 DIAGNOSIS — I639 Cerebral infarction, unspecified: Secondary | ICD-10-CM | POA: Diagnosis not present

## 2018-04-22 ENCOUNTER — Encounter (HOSPITAL_COMMUNITY): Payer: Self-pay

## 2018-04-22 ENCOUNTER — Emergency Department (HOSPITAL_COMMUNITY)
Admission: EM | Admit: 2018-04-22 | Discharge: 2018-04-22 | Disposition: A | Discharge status: Home / Self Care | Payer: Medicare Other | Attending: Emergency Medicine | Admitting: Emergency Medicine

## 2018-04-22 ENCOUNTER — Other Ambulatory Visit: Payer: Self-pay

## 2018-04-22 DIAGNOSIS — R404 Transient alteration of awareness: Secondary | ICD-10-CM | POA: Insufficient documentation

## 2018-04-22 DIAGNOSIS — I130 Hypertensive heart and chronic kidney disease with heart failure and stage 1 through stage 4 chronic kidney disease, or unspecified chronic kidney disease: Secondary | ICD-10-CM | POA: Insufficient documentation

## 2018-04-22 DIAGNOSIS — I5032 Chronic diastolic (congestive) heart failure: Secondary | ICD-10-CM | POA: Insufficient documentation

## 2018-04-22 DIAGNOSIS — Z7902 Long term (current) use of antithrombotics/antiplatelets: Secondary | ICD-10-CM | POA: Insufficient documentation

## 2018-04-22 DIAGNOSIS — Z79899 Other long term (current) drug therapy: Secondary | ICD-10-CM | POA: Diagnosis not present

## 2018-04-22 DIAGNOSIS — I10 Essential (primary) hypertension: Secondary | ICD-10-CM | POA: Diagnosis not present

## 2018-04-22 DIAGNOSIS — N184 Chronic kidney disease, stage 4 (severe): Secondary | ICD-10-CM | POA: Diagnosis not present

## 2018-04-22 DIAGNOSIS — R4020 Unspecified coma: Secondary | ICD-10-CM | POA: Diagnosis not present

## 2018-04-22 DIAGNOSIS — Z8673 Personal history of transient ischemic attack (TIA), and cerebral infarction without residual deficits: Secondary | ICD-10-CM | POA: Diagnosis not present

## 2018-04-22 DIAGNOSIS — F17210 Nicotine dependence, cigarettes, uncomplicated: Secondary | ICD-10-CM | POA: Insufficient documentation

## 2018-04-22 DIAGNOSIS — Z7401 Bed confinement status: Secondary | ICD-10-CM | POA: Diagnosis not present

## 2018-04-22 DIAGNOSIS — R0689 Other abnormalities of breathing: Secondary | ICD-10-CM | POA: Diagnosis not present

## 2018-04-22 DIAGNOSIS — R55 Syncope and collapse: Secondary | ICD-10-CM | POA: Diagnosis present

## 2018-04-22 NOTE — ED Provider Notes (Signed)
[facility] EMERGENCY DEPARTMENT Provider Note   CSN: [id number] Arrival date & time: 04/22/18  0945     History   Chief Complaint Chief Complaint  Patient presents with  . Loss of Consciousness    HPI LATRAVIS GRINE is a 68 y.o. male.  HPI Patient coming from nursing home.  This morning was difficult to arouse and EMS was called.  Per EMS patient was alert and oriented.  Patient denies any pain.  He is a poor historian.   Past Medical History:  Diagnosis Date  . Carotid artery stenosis   . Chronic diastolic heart failure ([place])   . Chronic kidney disease, stage IV (severe) ([place])   . Cognitive deficits   . CVA (cerebral infarction) 11/2010   CT 8/12 : Sm ischemic infarct posterior limb of L internal capsule. Carotids nl. ECHO nl with EF 55-60%. R sided weakness.   . Hyperlipemia   . Hyperlipidemia 01/06/2011   Diagnosed during acute CVA hospitalization. On statin.  Marland Kitchen Hypertension 11/2010   Diagnosed during acute CVA hospitalization  . Occlusion of anterior cerebral artery   . Polysubstance abuse ([place]) 01/06/2011   cocaine, tobacco, THC  . PVD (peripheral vascular disease) ([place])   . Right hemiplegia ([place])   . Stroke [facility])     Patient Active Problem List   Diagnosis Date Noted  . Anemia 11/03/2017  . UGI bleed 06/24/2017  . E coli bacteremia 06/17/2017  . Altered mental status 06/15/2017  . Pressure injury of skin 06/15/2017  . AKI (acute kidney injury) ([place]) 06/15/2017  . CKD (chronic kidney disease) stage 4, GFR 15-29 ml/min (HCC) 06/15/2017  . History of stroke 06/15/2017  . Acute lower UTI 06/15/2017  . Dehydration 06/15/2017  . Cocaine abuse ([place])   . Noncompliance w/medication treatment due to intermit use of medication   . Middle cerebral artery stenosis, right   . Stenosis of right carotid artery   . Cerebrovascular accident (CVA) ([place])   . 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 Smoker 06/12/2014  . Memory loss or impairment 02/13/2013  . Right shoulder  pain 02/21/2011  . HTN (hypertension) 01/06/2011  . Hyperlipidemia with target LDL less than 100 01/06/2011  . Polysubstance abuse ([place]) 01/06/2011  . CVA (cerebral infarction) 01/06/2011  . Preventative health care 01/06/2011    Past Surgical History:  Procedure Laterality Date  . ESOPHAGOGASTRODUODENOSCOPY (EGD) WITH PROPOFOL N/A 06/25/2017   Dr. Oneida Alar: small hiatal hernia, gastritis/duodenititis. benign biopsy without H.pylori.         Home Medications    Prior to Admission medications   Medication Sig Start Date End Date Taking? Authorizing Provider  amLODipine (NORVASC) 10 MG tablet Take 1 tablet (10 mg total) by mouth daily. 06/12/14   Funches, Adriana Mccallum, MD  atorvastatin (LIPITOR) 40 MG tablet Take 1 tablet (40 mg total) by mouth daily at 6 PM. 06/02/17   Patrecia Pour, Christean Grief, MD  cloNIDine (CATAPRES - DOSED IN MG/24 HR) 0.2 mg/24hr patch Place 0.2 mg onto the skin once a week.    [provider]  cloNIDine (CATAPRES) 0.2 MG tablet Take 1 tablet (0.2 mg total) by mouth 2 (two) times daily. Patient taking differently: Take 0.1 mg by mouth 2 (two) times daily.  06/02/17   Doreatha Lew, MD  clopidogrel (PLAVIX) 75 MG tablet Take 1 tablet (75 mg total) by mouth daily. 06/03/17   Patrecia Pour, Christean Grief, MD  divalproex (DEPAKOTE) 250 MG DR tablet Take 250 mg by mouth daily.  [provider]  hydrALAZINE (APRESOLINE) 100 MG tablet Take 100 mg by mouth 3 (three) times daily.    [provider]  OLANZapine (ZYPREXA) 5 MG tablet Take 5 mg by mouth at bedtime.    [provider]  pantoprazole (PROTONIX) 40 MG tablet Take 1 tablet (40 mg total) by mouth 2 (two) times daily before a meal. 06/25/17 06/25/18  Kathie Dike, MD  sertraline (ZOLOFT) 25 MG tablet Take 25 mg by mouth daily.     [provider]  vitamin C (ASCORBIC ACID) 250 MG tablet Take 250 mg by mouth daily.    [provider]    Family History Family History  Problem  Relation Age of Onset  . Hypertension Mother   . Hypertension Father     Social History Social History   Tobacco Use  . Smoking status: Current Some Day Smoker    Packs/day: 0.50    Years: 40.00    Pack years: 20.00    Types: Cigarettes    Start date: 01/02/1965  . Smokeless tobacco: Never Used  . Tobacco comment: 2-3 per week  Substance Use Topics  . Alcohol use: Yes    Alcohol/week: 2.0 standard drinks    Types: 2 Standard drinks or equivalent per week    Comment: beer  . Drug use: Not Currently    Types: Cocaine     Allergies   Pork-derived products   Review of Systems Review of Systems  Eyes: Negative for visual disturbance.  Respiratory: Negative for shortness of breath.   Cardiovascular: Negative for chest pain.  Gastrointestinal: Negative for abdominal pain.  Musculoskeletal: Negative for back pain and neck pain.  Skin: Negative for rash and wound.  Neurological: Negative for weakness, numbness and headaches.  All other systems reviewed and are negative.    Physical Exam Updated Vital Signs BP (!) 165/67   Pulse 72   Resp 17   Ht 5\' 5"  (1.651 m)   Wt 58.9 kg   SpO2 96%   BMI 21.61 kg/m   Physical Exam Vitals signs and nursing note reviewed.  Constitutional:      Appearance: Normal appearance. He is well-developed.  HENT:     Head: Normocephalic and atraumatic.     Comments: No obvious head trauma Eyes:     Extraocular Movements: Extraocular movements intact.     Pupils: Pupils are equal, round, and reactive to light.  Neck:     Musculoskeletal: Normal range of motion and neck supple. No neck rigidity or muscular tenderness.  Cardiovascular:     Rate and Rhythm: Normal rate and regular rhythm.  Pulmonary:     Effort: Pulmonary effort is normal.     Breath sounds: Normal breath sounds.  Abdominal:     General: Bowel sounds are normal.     Palpations: Abdomen is soft.     Tenderness: There is no abdominal tenderness. There is no guarding  or rebound.  Musculoskeletal: Normal range of motion.        General: No tenderness.  Lymphadenopathy:     Cervical: No cervical adenopathy.  Skin:    General: Skin is warm and dry.     Findings: No erythema or rash.  Neurological:     Mental Status: He is alert.     Comments: Patient refusing to cooperate with neurologic exam.  Psychiatric:        Behavior: Behavior normal.      ED Treatments / Results  Labs (all labs ordered  are listed, but only abnormal results are displayed) Labs Reviewed  COMPREHENSIVE METABOLIC PANEL  CBC WITH DIFFERENTIAL/PLATELET  URINALYSIS, ROUTINE W REFLEX MICROSCOPIC    EKG None  Radiology No results found.  Procedures Procedures (including critical care time)  Medications Ordered in ED Medications - No data to display   Initial Impression / Assessment and Plan / ED Course  I have reviewed the triage vital signs and the nursing notes.  Pertinent labs & imaging results that were available during my care of the patient were reviewed by me and considered in my medical decision making (see chart for details).     Limited exam due to patient refusal to cooperate.  He is also refusing blood work. Vital signs remained stable.  Patient appears to be at his baseline mental status.  Asking to be discharged home. Final Clinical Impressions(s) / ED Diagnoses   Final diagnoses:  Altered level of consciousness    ED Discharge Orders    None       Julianne Rice, MD 04/22/18 1229

## 2018-04-22 NOTE — ED Notes (Signed)
Notified by lab that patient was refusing to allow them to draw labs. Went into patient's room, and asked if he would allow [place] to draw blood. Patient states "NO!" When asked why, patient states "I don't want to. Take me back to [facility]. There ain't nothing wrong with me." Advised patient of need for blood work. Patient still refuses to allow this nurse or lab to draw blood. Advised Dr Lita Mains.

## 2018-04-22 NOTE — ED Triage Notes (Signed)
Pt brought to ED via [facility] from [facility]. Per EMS, pt was given his morning meds this morning by med tech, was rechecked and unarousable. EMS called. Pt moved to stretcher and awake, alert per EMS once transported. Pt noted to be hypertensive. Pt denies pain.

## 2018-04-23 DIAGNOSIS — I639 Cerebral infarction, unspecified: Secondary | ICD-10-CM | POA: Diagnosis not present

## 2018-04-25 DIAGNOSIS — I639 Cerebral infarction, unspecified: Secondary | ICD-10-CM | POA: Diagnosis not present

## 2018-04-26 DIAGNOSIS — I639 Cerebral infarction, unspecified: Secondary | ICD-10-CM | POA: Diagnosis not present

## 2018-04-26 DIAGNOSIS — R293 Abnormal posture: Secondary | ICD-10-CM | POA: Diagnosis not present

## 2018-04-27 DIAGNOSIS — I639 Cerebral infarction, unspecified: Secondary | ICD-10-CM | POA: Diagnosis not present

## 2018-04-27 DIAGNOSIS — R293 Abnormal posture: Secondary | ICD-10-CM | POA: Diagnosis not present

## 2018-04-28 DIAGNOSIS — F062 Psychotic disorder with delusions due to known physiological condition: Secondary | ICD-10-CM | POA: Diagnosis not present

## 2018-04-28 DIAGNOSIS — F331 Major depressive disorder, recurrent, moderate: Secondary | ICD-10-CM | POA: Diagnosis not present

## 2018-04-28 DIAGNOSIS — R293 Abnormal posture: Secondary | ICD-10-CM | POA: Diagnosis not present

## 2018-04-28 DIAGNOSIS — I639 Cerebral infarction, unspecified: Secondary | ICD-10-CM | POA: Diagnosis not present

## 2018-04-28 DIAGNOSIS — F0151 Vascular dementia with behavioral disturbance: Secondary | ICD-10-CM | POA: Diagnosis not present

## 2018-04-29 DIAGNOSIS — R293 Abnormal posture: Secondary | ICD-10-CM | POA: Diagnosis not present

## 2018-04-29 DIAGNOSIS — I639 Cerebral infarction, unspecified: Secondary | ICD-10-CM | POA: Diagnosis not present

## 2018-04-30 DIAGNOSIS — R293 Abnormal posture: Secondary | ICD-10-CM | POA: Diagnosis not present

## 2018-04-30 DIAGNOSIS — I639 Cerebral infarction, unspecified: Secondary | ICD-10-CM | POA: Diagnosis not present

## 2018-05-01 DIAGNOSIS — I639 Cerebral infarction, unspecified: Secondary | ICD-10-CM | POA: Diagnosis not present

## 2018-05-01 DIAGNOSIS — R293 Abnormal posture: Secondary | ICD-10-CM | POA: Diagnosis not present

## 2018-05-03 DIAGNOSIS — R293 Abnormal posture: Secondary | ICD-10-CM | POA: Diagnosis not present

## 2018-05-03 DIAGNOSIS — I639 Cerebral infarction, unspecified: Secondary | ICD-10-CM | POA: Diagnosis not present

## 2018-05-04 DIAGNOSIS — M79673 Pain in unspecified foot: Secondary | ICD-10-CM | POA: Diagnosis not present

## 2018-05-04 DIAGNOSIS — G8190 Hemiplegia, unspecified affecting unspecified side: Secondary | ICD-10-CM | POA: Diagnosis not present

## 2018-05-04 DIAGNOSIS — R4586 Emotional lability: Secondary | ICD-10-CM | POA: Diagnosis not present

## 2018-05-04 DIAGNOSIS — I639 Cerebral infarction, unspecified: Secondary | ICD-10-CM | POA: Diagnosis not present

## 2018-05-04 DIAGNOSIS — R293 Abnormal posture: Secondary | ICD-10-CM | POA: Diagnosis not present

## 2018-05-04 DIAGNOSIS — F0281 Dementia in other diseases classified elsewhere with behavioral disturbance: Secondary | ICD-10-CM | POA: Diagnosis not present

## 2018-05-05 DIAGNOSIS — R293 Abnormal posture: Secondary | ICD-10-CM | POA: Diagnosis not present

## 2018-05-05 DIAGNOSIS — I639 Cerebral infarction, unspecified: Secondary | ICD-10-CM | POA: Diagnosis not present

## 2018-05-06 DIAGNOSIS — I639 Cerebral infarction, unspecified: Secondary | ICD-10-CM | POA: Diagnosis not present

## 2018-05-06 DIAGNOSIS — W19XXXA Unspecified fall, initial encounter: Secondary | ICD-10-CM | POA: Diagnosis not present

## 2018-05-06 DIAGNOSIS — M25551 Pain in right hip: Secondary | ICD-10-CM | POA: Diagnosis not present

## 2018-05-06 DIAGNOSIS — R293 Abnormal posture: Secondary | ICD-10-CM | POA: Diagnosis not present

## 2018-05-09 DIAGNOSIS — I639 Cerebral infarction, unspecified: Secondary | ICD-10-CM | POA: Diagnosis not present

## 2018-05-09 DIAGNOSIS — R293 Abnormal posture: Secondary | ICD-10-CM | POA: Diagnosis not present

## 2018-05-10 DIAGNOSIS — I639 Cerebral infarction, unspecified: Secondary | ICD-10-CM | POA: Diagnosis not present

## 2018-05-10 DIAGNOSIS — R293 Abnormal posture: Secondary | ICD-10-CM | POA: Diagnosis not present

## 2018-05-11 DIAGNOSIS — R293 Abnormal posture: Secondary | ICD-10-CM | POA: Diagnosis not present

## 2018-05-11 DIAGNOSIS — I639 Cerebral infarction, unspecified: Secondary | ICD-10-CM | POA: Diagnosis not present

## 2018-05-12 DIAGNOSIS — I639 Cerebral infarction, unspecified: Secondary | ICD-10-CM | POA: Diagnosis not present

## 2018-05-12 DIAGNOSIS — R293 Abnormal posture: Secondary | ICD-10-CM | POA: Diagnosis not present

## 2018-05-13 DIAGNOSIS — I739 Peripheral vascular disease, unspecified: Secondary | ICD-10-CM | POA: Diagnosis not present

## 2018-05-13 DIAGNOSIS — R293 Abnormal posture: Secondary | ICD-10-CM | POA: Diagnosis not present

## 2018-05-13 DIAGNOSIS — I69391 Dysphagia following cerebral infarction: Secondary | ICD-10-CM | POA: Diagnosis not present

## 2018-05-13 DIAGNOSIS — I639 Cerebral infarction, unspecified: Secondary | ICD-10-CM | POA: Diagnosis not present

## 2018-05-13 DIAGNOSIS — F0281 Dementia in other diseases classified elsewhere with behavioral disturbance: Secondary | ICD-10-CM | POA: Diagnosis not present

## 2018-05-13 DIAGNOSIS — R4586 Emotional lability: Secondary | ICD-10-CM | POA: Diagnosis not present

## 2018-05-16 DIAGNOSIS — R1310 Dysphagia, unspecified: Secondary | ICD-10-CM | POA: Diagnosis not present

## 2018-05-16 DIAGNOSIS — R293 Abnormal posture: Secondary | ICD-10-CM | POA: Diagnosis not present

## 2018-05-16 DIAGNOSIS — I639 Cerebral infarction, unspecified: Secondary | ICD-10-CM | POA: Diagnosis not present

## 2018-05-17 DIAGNOSIS — I639 Cerebral infarction, unspecified: Secondary | ICD-10-CM | POA: Diagnosis not present

## 2018-05-17 DIAGNOSIS — R1310 Dysphagia, unspecified: Secondary | ICD-10-CM | POA: Diagnosis not present

## 2018-05-17 DIAGNOSIS — R293 Abnormal posture: Secondary | ICD-10-CM | POA: Diagnosis not present

## 2018-05-18 DIAGNOSIS — R1310 Dysphagia, unspecified: Secondary | ICD-10-CM | POA: Diagnosis not present

## 2018-05-18 DIAGNOSIS — R634 Abnormal weight loss: Secondary | ICD-10-CM | POA: Diagnosis not present

## 2018-05-18 DIAGNOSIS — R627 Adult failure to thrive: Secondary | ICD-10-CM | POA: Diagnosis not present

## 2018-05-18 DIAGNOSIS — R630 Anorexia: Secondary | ICD-10-CM | POA: Diagnosis not present

## 2018-05-18 DIAGNOSIS — F0281 Dementia in other diseases classified elsewhere with behavioral disturbance: Secondary | ICD-10-CM | POA: Diagnosis not present

## 2018-05-18 DIAGNOSIS — I639 Cerebral infarction, unspecified: Secondary | ICD-10-CM | POA: Diagnosis not present

## 2018-05-18 DIAGNOSIS — R293 Abnormal posture: Secondary | ICD-10-CM | POA: Diagnosis not present

## 2018-05-20 DIAGNOSIS — R1310 Dysphagia, unspecified: Secondary | ICD-10-CM | POA: Diagnosis not present

## 2018-05-20 DIAGNOSIS — I639 Cerebral infarction, unspecified: Secondary | ICD-10-CM | POA: Diagnosis not present

## 2018-05-20 DIAGNOSIS — R293 Abnormal posture: Secondary | ICD-10-CM | POA: Diagnosis not present

## 2018-05-21 DIAGNOSIS — I639 Cerebral infarction, unspecified: Secondary | ICD-10-CM | POA: Diagnosis not present

## 2018-05-21 DIAGNOSIS — R293 Abnormal posture: Secondary | ICD-10-CM | POA: Diagnosis not present

## 2018-05-21 DIAGNOSIS — R1310 Dysphagia, unspecified: Secondary | ICD-10-CM | POA: Diagnosis not present

## 2018-05-22 DIAGNOSIS — I639 Cerebral infarction, unspecified: Secondary | ICD-10-CM | POA: Diagnosis not present

## 2018-05-22 DIAGNOSIS — R293 Abnormal posture: Secondary | ICD-10-CM | POA: Diagnosis not present

## 2018-05-22 DIAGNOSIS — R1310 Dysphagia, unspecified: Secondary | ICD-10-CM | POA: Diagnosis not present

## 2018-05-23 DIAGNOSIS — L988 Other specified disorders of the skin and subcutaneous tissue: Secondary | ICD-10-CM | POA: Diagnosis not present

## 2018-05-23 DIAGNOSIS — R293 Abnormal posture: Secondary | ICD-10-CM | POA: Diagnosis not present

## 2018-05-23 DIAGNOSIS — I639 Cerebral infarction, unspecified: Secondary | ICD-10-CM | POA: Diagnosis not present

## 2018-05-23 DIAGNOSIS — I739 Peripheral vascular disease, unspecified: Secondary | ICD-10-CM | POA: Diagnosis not present

## 2018-05-23 DIAGNOSIS — B351 Tinea unguium: Secondary | ICD-10-CM | POA: Diagnosis not present

## 2018-05-23 DIAGNOSIS — R1310 Dysphagia, unspecified: Secondary | ICD-10-CM | POA: Diagnosis not present

## 2018-05-25 ENCOUNTER — Ambulatory Visit: Payer: Medicaid Other | Admitting: Adult Health

## 2018-05-26 ENCOUNTER — Telehealth: Payer: Self-pay

## 2018-05-26 ENCOUNTER — Encounter: Payer: Self-pay | Admitting: Adult Health

## 2018-05-26 NOTE — Telephone Encounter (Signed)
Pt no show for appt on 05/25/2018.

## 2018-05-27 ENCOUNTER — Ambulatory Visit (INDEPENDENT_AMBULATORY_CARE_PROVIDER_SITE_OTHER): Payer: Medicare Other | Admitting: Adult Health

## 2018-05-27 ENCOUNTER — Encounter: Payer: Self-pay | Admitting: Adult Health

## 2018-05-27 VITALS — BP 127/65 | HR 56

## 2018-05-27 DIAGNOSIS — I10 Essential (primary) hypertension: Secondary | ICD-10-CM | POA: Diagnosis not present

## 2018-05-27 DIAGNOSIS — E785 Hyperlipidemia, unspecified: Secondary | ICD-10-CM | POA: Diagnosis not present

## 2018-05-27 DIAGNOSIS — I69351 Hemiplegia and hemiparesis following cerebral infarction affecting right dominant side: Secondary | ICD-10-CM | POA: Diagnosis not present

## 2018-05-27 DIAGNOSIS — I69322 Dysarthria following cerebral infarction: Secondary | ICD-10-CM

## 2018-05-27 DIAGNOSIS — I6381 Other cerebral infarction due to occlusion or stenosis of small artery: Secondary | ICD-10-CM | POA: Diagnosis not present

## 2018-05-27 DIAGNOSIS — I69319 Unspecified symptoms and signs involving cognitive functions following cerebral infarction: Secondary | ICD-10-CM | POA: Diagnosis not present

## 2018-05-27 NOTE — Progress Notes (Signed)
I agree with the above plan 

## 2018-05-27 NOTE — Patient Instructions (Signed)
Continue clopidogrel 75 mg daily  and lipitor  for secondary stroke prevention  Continue to follow up with PCP regarding cholesterol and blood pressure management   Continue to monitor blood pressure at home  Maintain strict control of hypertension with blood pressure goal below 130/90, diabetes with hemoglobin A1c goal below 6.5% and cholesterol with LDL cholesterol (bad cholesterol) goal below 70 mg/dL. I also advised the patient to eat a healthy diet with plenty of whole grains, cereals, fruits and vegetables, exercise regularly and maintain ideal body weight.  Followup in the future with me as needed or call earlier if needed       Thank you for coming to see [place] at [facility]. I hope we have been able to provide you high quality care today.  You may receive a patient satisfaction survey over the next few weeks. We would appreciate your feedback and comments so that we may continue to improve ourselves and the health of our patients.

## 2018-05-27 NOTE — Progress Notes (Signed)
[facility] [address] [place]. [place] [address] [phone]       OFFICE FOLLOW UP NOTE  Mr. ALECSANDER HATTABAUGH Date of Birth:  08/04/1950 Medical Record Number:  [id number]   Reason for Referral:  hospital stroke follow up  CHIEF COMPLAINT:  Chief Complaint  Patient presents with  . Follow-up    Stroke follow up room 9 pt with transportation [place]     HPI: 05/27/18 VISIT Mr. Chermak is a 68 year old male who is being seen today for routine follow-up visit after left lacunar stroke in 05/2017.  Is currently residing at [facility] and is accompanied at today's appointment by transportation employee.  He continues to have residual deficit of right hemiparesis, dysarthria and cognitive deficit.  He does endorse continuing therapies but per review of information provided, it does not appear as though this has continued.  He is currently sitting in a wheelchair as he is nonambulatory due to hemiparesis.  Continues on Plavix without side effects or bruising.  Continues on atorvastatin without side effects myalgias.  Blood pressure today 127/65.  Denies new or worsening stroke/TIA symptoms.    HISTORY 11/22/17: Patient is being seen today for follow-up visit and is accompanied by transportation from facility.  Patient continues to participate and speech and also states participation and physical therapy but per notes from facility, physical therapy is not currently prescribed.  Patient continues to have right hemiparesis along with dysarthria but states this has been improving.  He continues to take Plavix without side effects of bleeding or bruising.  Continues to take Lipitor without side effects myalgias.  Blood pressure today elevated 175/78 but per recent blood pressure at facility 132/70.  Patient is currently sitting in wheelchair but able to ambulate when he feels up to it per transportation worker.  Patient does state compliance with all medications.   Denies new or worsening stroke/TIA symptoms.    CLEBERT WENGER is being seen today for initial visit in the office for left lacunar stroke on 05/24/17. History obtained from patient and chart review. Reviewed all radiology images and labs personally.  CEDERIC MOZLEY is an 68 y.o. male who lives in a hotel with PMH of HTN, HLD, and polysubstance abuse.  Patient came to [facility] ED on 05/24/17 with complaint of right-sided weakness that began a day prior.  Patient admits to having hypertension but not taking his meds as prescribed.  He takes his meds when he thinks he needs him. Hypertensive with blood pressure at 250/82.  Patient had urine drug positive for cocaine however he does not admit to using cocaine.  CT head demonstrated a new lacunar infarct in the left corona radiata and superior basal ganglia.  Patient states usually can use his right side however at this point he cannot use his right side.  MRI of the head showed an acute left thalamus infarct and an acute versus subacute left frontal lobe punctate infarct.  The scan did show old bilateral basal ganglia, thalamus, and pontine lacunar infarcts.  MRA of the head showed no emergent large vessel occlusion but did show severe stenosis in the right ICA cavernous segment and slow flow right MCA with severe stenosis right M2 origin.Bilateral carotid Dopplers showed bilateral stenosis of 1-39%.  2D echo normal without cardiac source of emboli.  LDL 167 and A1c 5.3.  Patient was previously prescribed Lipitor 40 mg but unsure if he was taking this appropriately. It was recommended to continue  Lipitor 40 mg.  Patient was prescribed 81 mg PTA but was not taking appropriately.  It was recommended patient take aspirin 325 mg along with Plavix 75 mg for 3 months and then just Plavix daily.  Patient discharged to SNF facility. Recent hospital admission on 06/15/2017 for low-grade fever, generalized weakness and lethargy.  He was giving fluids, and treated for E. coli  gram-negative bacteremia.  Patient discharged back to skilled nursing facility. Recent hospital admission on 06/24/2017 for hematemesis.  Underwent EGD which was unrevealing and recommended to continue Protonix twice a day for the next 30 days but also continue aspirin and Plavix.  Recommend colonoscopy in 4 weeks.  Return back to SNF. Patient is being seen today for stroke follow-up he continues to have right-sided hemiparesis as he has been refusing to participate in PT/OT.  He also has been refusing his blood pressure medications and at today's visit blood pressure is 198/82.  Recheck manual blood pressure at 192/82.  He is not symptomatic his he feels fine at this time.  He is continue to take aspirin and Plavix without increase in bleeding or bruising but recommended to be discharged on aspirin 325 but currently taking aspirin 81 mg.  He will continue to take DA PT until 08/23/2017 and then continue Plavix alone.  When questioned about reasoning behind refusing therapy and blood pressure, he states that he feels as though he does not need it.  Stressed the importance of controlling blood pressure and cholesterol as this will help decrease risk for having an additional stroke and PT/OT to help him recover.  I advised him of the importance of participating in therapy as he can start to develop generalized atrophy and weakness from sitting in a wheelchair.  Also advised him the importance of being compliant with his medications to help control his risk factors such as HLD and HTN and if he continues to refuse these medications, his chances of an additional stroke are very likely.  Patient states that he does not want to have an additional stroke and will start being compliant with PT/OT and medications.  It seems as though he has been compliant with Lipitor as his LDL currently 82 on 07/07/2017 (see previous 167).  No new or worsening stroke/TIA symptoms.    ROS:   14 system review of systems performed and  negative with exception of see HPI  PMH:  Past Medical History:  Diagnosis Date  . Carotid artery stenosis   . Chronic diastolic heart failure ([place])   . Chronic kidney disease, stage IV (severe) ([place])   . Cognitive deficits   . CVA (cerebral infarction) 11/2010   CT 8/12 : Sm ischemic infarct posterior limb of L internal capsule. Carotids nl. ECHO nl with EF 55-60%. R sided weakness.   . Hyperlipemia   . Hyperlipidemia 01/06/2011   Diagnosed during acute CVA hospitalization. On statin.  Marland Kitchen Hypertension 11/2010   Diagnosed during acute CVA hospitalization  . Occlusion of anterior cerebral artery   . Polysubstance abuse ([place]) 01/06/2011   cocaine, tobacco, THC  . PVD (peripheral vascular disease) ([place])   . Right hemiplegia ([place])   . Stroke [facility])     PSH:  Past Surgical History:  Procedure Laterality Date  . ESOPHAGOGASTRODUODENOSCOPY (EGD) WITH PROPOFOL N/A 06/25/2017   Dr. Oneida Alar: small hiatal hernia, gastritis/duodenititis. benign biopsy without H.pylori.     Social History:  Social History   Socioeconomic History  . Marital status: Single    Spouse name: Not  on file  . Number of children: Not on file  . Years of education: Not on file  . Highest education level: Not on file  Occupational History  . Not on file  Social Needs  . Financial resource strain: Not on file  . Food insecurity:    Worry: Not on file    Inability: Not on file  . Transportation needs:    Medical: Not on file    Non-medical: Not on file  Tobacco Use  . Smoking status: Current Some Day Smoker    Packs/day: 0.50    Years: 40.00    Pack years: 20.00    Types: Cigarettes    Start date: 01/02/1965  . Smokeless tobacco: Never Used  . Tobacco comment: 2-3 per week  Substance and Sexual Activity  . Alcohol use: Yes    Alcohol/week: 2.0 standard drinks    Types: 2 Standard drinks or equivalent per week    Comment: beer  . Drug use: Not Currently    Types: Cocaine  . Sexual activity: Never    Lifestyle  . Physical activity:    Days per week: Not on file    Minutes per session: Not on file  . Stress: Not on file  Relationships  . Social connections:    Talks on phone: Not on file    Gets together: Not on file    Attends religious service: Not on file    Active member of club or organization: Not on file    Attends meetings of clubs or organizations: Not on file    Relationship status: Not on file  . Intimate partner violence:    Fear of current or ex partner: Not on file    Emotionally abused: Not on file    Physically abused: Not on file    Forced sexual activity: Not on file  Other Topics Concern  . Not on file  Social History Narrative  . Not on file    Family History:  Family History  Problem Relation Age of Onset  . Hypertension Mother   . Hypertension Father     Medications:   Current Outpatient Medications on File Prior to Visit  Medication Sig Dispense Refill  . amLODipine (NORVASC) 10 MG tablet Take 1 tablet (10 mg total) by mouth daily. 90 tablet 3  . atorvastatin (LIPITOR) 40 MG tablet Take 1 tablet (40 mg total) by mouth daily at 6 PM.    . cloNIDine (CATAPRES - DOSED IN MG/24 HR) 0.2 mg/24hr patch Place 0.2 mg onto the skin once a week.    . cloNIDine (CATAPRES) 0.2 MG tablet Take 1 tablet (0.2 mg total) by mouth 2 (two) times daily. (Patient taking differently: Take 0.1 mg by mouth 2 (two) times daily. )    . clopidogrel (PLAVIX) 75 MG tablet Take 1 tablet (75 mg total) by mouth daily.    Marland Kitchen dexamethasone (DECADRON) 4 MG tablet Take 4 mg by mouth 2 (two) times daily with a meal.    . divalproex (DEPAKOTE) 250 MG DR tablet Take 250 mg by mouth daily.    . hydrALAZINE (APRESOLINE) 100 MG tablet Take 100 mg by mouth 3 (three) times daily.    Marland Kitchen OLANZapine (ZYPREXA) 5 MG tablet Take 5 mg by mouth at bedtime.    . pantoprazole (PROTONIX) 40 MG tablet Take 1 tablet (40 mg total) by mouth 2 (two) times daily before a meal. 30 tablet 1  . sertraline  (ZOLOFT) 25 MG tablet Take  25 mg by mouth daily.     . vitamin C (ASCORBIC ACID) 250 MG tablet Take 250 mg by mouth daily.     No current facility-administered medications on file prior to visit.     Allergies:   Allergies  Allergen Reactions  . Pork-Derived Products Other (See Comments)    Patient does not eat pork products.     Physical Exam  Vitals:   05/27/18 1006  BP: 127/65  Pulse: (!) 56   There is no height or weight on file to calculate BMI. No exam data present  General: Frail pleasant elderly African-American male, well nourished, seated, in no evident distress Head: head normocephalic and atraumatic.   Neck: supple with no carotid or supraclavicular bruits Cardiovascular: regular rate and rhythm, no murmurs Musculoskeletal: no deformity Skin:  no rash/petichiae Vascular:  Normal pulses all extremities  Neurologic Exam Mental Status: Awake and fully alert.  Mild dysarthria.  Oriented to place but disoriented to time. Recent and remote memory diminished. Attention span, concentration and fund of knowledge diminished. Mood and affect appropriate and cooperative with exam.  Cranial Nerves: Pupils equal, briskly reactive to light. Extraocular movements full without nystagmus. Visual fields full to confrontation. Hearing intact. Facial sensation intact. Face, tongue, palate moves normally and symmetrically.  Motor: RUE: 3+/5 with weak grip strength, RLE: 3/5 greatest in hip flexor and ankle dorsiflexion; full strength in left upper and lower extremity Sensory.: intact to touch , pinprick , position and vibratory sensation.  Coordination: Decreased right finger dexterity.  Left arm orbits over right. Gait and Station: Gait assessment deferred as he is nonambulatory Reflexes: Brisk and symmetric. Toes downgoing.      Diagnostic Data (Labs, Imaging, Testing)  Ct Head Wo Contrast Result Date: 05/24/2017 IMPRESSION: 1. No evidence of acute large territory infarct or  intracranial hemorrhage. 2. New lacunar infarcts in the left corona radiata/superior basal ganglia, possibly acute or subacute. 3. 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    MRI/MRA Brain/Head:   IMPRESSION: MRI HEAD:  1. Acute subcentimeter nonhemorrhagic LEFT thalamus infarct. Acute versus subacute LEFT frontal lobe punctate infarct. 2. Old bilateral basal ganglia and thalamus infarcts. Old pontine lacunar infarcts. 3. Moderate chronic small vessel ischemic disease. 4. Moderate parenchymal brain volume loss.  MRA HEAD:  1. No emergent large vessel occlusion. 2. Severe stenosis RIGHT ICA cavernous segment. Slow flow RIGHT MCA with severe stenosis RIGHT M2 origin.   Echocardiogram:                                    Study Conclusions  - Left ventricle: The cavity size was normal. Wall thickness was increased in a pattern of moderate LVH. Systolic function was normal. The estimated ejection fraction was in the range of 55% to 60%. Wall motion was normal; there were no regional wall motion abnormalities. Features are consistent with a pseudonormal left ventricular filling pattern, with concomitant abnormal relaxation and increased filling pressure (grade 2 diastolic dysfunction). - Aortic valve: Trileaflet; mildly thickened, mildly calcified leaflets. There was mild regurgitation. - Mitral valve: Calcified annulus. There was mild regurgitation. - Left atrium: The atrium was moderately dilated. Volume/bsa, ES, (1-plane Simpson&'s, A2C): 44.4 ml/m^2. - Tricuspid valve: There was mild regurgitation. - Pulmonary arteries: Systolic pressure was mildly increased. Impressions:- No cardiac source of emboli was indentified.   B/L Carotid U/S:  1-39% ICA stenosis  ASSESSMENT: JAMA KRICHBAUM is a 68 y.o. year old male here with left lacunar infarct on 05/24/2017 secondary to polysubstance abuse and small vessel  disease. Vascular risk factors include HTN, HLD, and polysubstance abuse.  He has been seen here today for follow-up visit with residual deficits of right hemiparesis, dysarthria and cognitive deficit.    PLAN: -Continue clopidogrel 75 mg daily  and Lipitor for secondary stroke prevention -F/u with PCP regarding your HLD and HTN management -Slight worsening of right hemiparesis likely due to overall deconditioning and not participating in therapies.  Highly encouraged patient to continue to be active with possible need of restarting therapy to prevent deconditioning -Continue monitoring of BP at facility -Maintain strict control of hypertension with blood pressure goal below 130/90, diabetes with hemoglobin A1c goal below 6.5% and cholesterol with LDL cholesterol (bad cholesterol) goal below 70 mg/dL. I also advised the patient to eat a healthy diet with plenty of whole grains, cereals, fruits and vegetables, exercise regularly and maintain ideal body weight.  As he is currently stable from a stroke standpoint and is being monitored closely at facility, he will follow-up in this office as needed but advised to call with questions, concerns or need of future follow-up appointment  Greater than 50% time during this 25 minute consultation visit was spent on counseling and coordination of care about HTN, and HLD, discussion about risk benefit of anticoagulation and answering questions.   Venancio Poisson, AGNP-BC  [facility] [address] [place], [place] [address]  Phone [phone] Fax [phone]

## 2018-05-28 DIAGNOSIS — R630 Anorexia: Secondary | ICD-10-CM | POA: Diagnosis not present

## 2018-05-28 DIAGNOSIS — R627 Adult failure to thrive: Secondary | ICD-10-CM | POA: Diagnosis not present

## 2018-05-28 DIAGNOSIS — M25559 Pain in unspecified hip: Secondary | ICD-10-CM | POA: Diagnosis not present

## 2018-05-28 DIAGNOSIS — R634 Abnormal weight loss: Secondary | ICD-10-CM | POA: Diagnosis not present

## 2018-06-05 DIAGNOSIS — I639 Cerebral infarction, unspecified: Secondary | ICD-10-CM | POA: Diagnosis not present

## 2018-06-05 DIAGNOSIS — R630 Anorexia: Secondary | ICD-10-CM | POA: Diagnosis not present

## 2018-06-05 DIAGNOSIS — I10 Essential (primary) hypertension: Secondary | ICD-10-CM | POA: Diagnosis not present

## 2018-06-05 DIAGNOSIS — R634 Abnormal weight loss: Secondary | ICD-10-CM | POA: Diagnosis not present

## 2018-06-07 DIAGNOSIS — D649 Anemia, unspecified: Secondary | ICD-10-CM | POA: Diagnosis not present

## 2018-06-08 DIAGNOSIS — L089 Local infection of the skin and subcutaneous tissue, unspecified: Secondary | ICD-10-CM | POA: Diagnosis not present

## 2018-06-08 DIAGNOSIS — R4586 Emotional lability: Secondary | ICD-10-CM | POA: Diagnosis not present

## 2018-06-08 DIAGNOSIS — F0281 Dementia in other diseases classified elsewhere with behavioral disturbance: Secondary | ICD-10-CM | POA: Diagnosis not present

## 2018-06-08 DIAGNOSIS — I10 Essential (primary) hypertension: Secondary | ICD-10-CM | POA: Diagnosis not present

## 2018-06-09 DIAGNOSIS — F331 Major depressive disorder, recurrent, moderate: Secondary | ICD-10-CM | POA: Diagnosis not present

## 2018-06-09 DIAGNOSIS — F062 Psychotic disorder with delusions due to known physiological condition: Secondary | ICD-10-CM | POA: Diagnosis not present

## 2018-06-09 DIAGNOSIS — F0151 Vascular dementia with behavioral disturbance: Secondary | ICD-10-CM | POA: Diagnosis not present

## 2018-06-10 DIAGNOSIS — Z79899 Other long term (current) drug therapy: Secondary | ICD-10-CM | POA: Diagnosis not present

## 2018-06-10 DIAGNOSIS — D649 Anemia, unspecified: Secondary | ICD-10-CM | POA: Diagnosis not present

## 2018-06-29 DIAGNOSIS — R1310 Dysphagia, unspecified: Secondary | ICD-10-CM | POA: Diagnosis not present

## 2018-06-29 DIAGNOSIS — I639 Cerebral infarction, unspecified: Secondary | ICD-10-CM | POA: Diagnosis not present

## 2018-06-30 DIAGNOSIS — I639 Cerebral infarction, unspecified: Secondary | ICD-10-CM | POA: Diagnosis not present

## 2018-06-30 DIAGNOSIS — R1310 Dysphagia, unspecified: Secondary | ICD-10-CM | POA: Diagnosis not present

## 2018-07-01 DIAGNOSIS — R1310 Dysphagia, unspecified: Secondary | ICD-10-CM | POA: Diagnosis not present

## 2018-07-01 DIAGNOSIS — I639 Cerebral infarction, unspecified: Secondary | ICD-10-CM | POA: Diagnosis not present

## 2018-07-03 DIAGNOSIS — L089 Local infection of the skin and subcutaneous tissue, unspecified: Secondary | ICD-10-CM | POA: Diagnosis not present

## 2018-07-03 DIAGNOSIS — F0281 Dementia in other diseases classified elsewhere with behavioral disturbance: Secondary | ICD-10-CM | POA: Diagnosis not present

## 2018-07-03 DIAGNOSIS — R4586 Emotional lability: Secondary | ICD-10-CM | POA: Diagnosis not present

## 2018-07-03 DIAGNOSIS — I10 Essential (primary) hypertension: Secondary | ICD-10-CM | POA: Diagnosis not present

## 2018-07-04 DIAGNOSIS — R1310 Dysphagia, unspecified: Secondary | ICD-10-CM | POA: Diagnosis not present

## 2018-07-04 DIAGNOSIS — I639 Cerebral infarction, unspecified: Secondary | ICD-10-CM | POA: Diagnosis not present

## 2018-07-05 DIAGNOSIS — I639 Cerebral infarction, unspecified: Secondary | ICD-10-CM | POA: Diagnosis not present

## 2018-07-05 DIAGNOSIS — R1310 Dysphagia, unspecified: Secondary | ICD-10-CM | POA: Diagnosis not present

## 2018-07-06 DIAGNOSIS — R1310 Dysphagia, unspecified: Secondary | ICD-10-CM | POA: Diagnosis not present

## 2018-07-06 DIAGNOSIS — I639 Cerebral infarction, unspecified: Secondary | ICD-10-CM | POA: Diagnosis not present

## 2018-07-06 DIAGNOSIS — E782 Mixed hyperlipidemia: Secondary | ICD-10-CM | POA: Diagnosis not present

## 2018-07-06 DIAGNOSIS — D649 Anemia, unspecified: Secondary | ICD-10-CM | POA: Diagnosis not present

## 2018-07-06 DIAGNOSIS — Z79899 Other long term (current) drug therapy: Secondary | ICD-10-CM | POA: Diagnosis not present

## 2018-07-07 DIAGNOSIS — R1310 Dysphagia, unspecified: Secondary | ICD-10-CM | POA: Diagnosis not present

## 2018-07-07 DIAGNOSIS — I639 Cerebral infarction, unspecified: Secondary | ICD-10-CM | POA: Diagnosis not present

## 2018-07-09 DIAGNOSIS — R1310 Dysphagia, unspecified: Secondary | ICD-10-CM | POA: Diagnosis not present

## 2018-07-09 DIAGNOSIS — I639 Cerebral infarction, unspecified: Secondary | ICD-10-CM | POA: Diagnosis not present

## 2018-07-11 DIAGNOSIS — I639 Cerebral infarction, unspecified: Secondary | ICD-10-CM | POA: Diagnosis not present

## 2018-07-11 DIAGNOSIS — R1310 Dysphagia, unspecified: Secondary | ICD-10-CM | POA: Diagnosis not present

## 2018-07-12 DIAGNOSIS — F0281 Dementia in other diseases classified elsewhere with behavioral disturbance: Secondary | ICD-10-CM | POA: Diagnosis not present

## 2018-07-12 DIAGNOSIS — R1310 Dysphagia, unspecified: Secondary | ICD-10-CM | POA: Diagnosis not present

## 2018-07-12 DIAGNOSIS — R634 Abnormal weight loss: Secondary | ICD-10-CM | POA: Diagnosis not present

## 2018-07-12 DIAGNOSIS — I639 Cerebral infarction, unspecified: Secondary | ICD-10-CM | POA: Diagnosis not present

## 2018-07-13 DIAGNOSIS — R1310 Dysphagia, unspecified: Secondary | ICD-10-CM | POA: Diagnosis not present

## 2018-07-13 DIAGNOSIS — I639 Cerebral infarction, unspecified: Secondary | ICD-10-CM | POA: Diagnosis not present

## 2018-07-13 DIAGNOSIS — F0151 Vascular dementia with behavioral disturbance: Secondary | ICD-10-CM | POA: Diagnosis not present

## 2018-07-13 DIAGNOSIS — F331 Major depressive disorder, recurrent, moderate: Secondary | ICD-10-CM | POA: Diagnosis not present

## 2018-07-13 DIAGNOSIS — F062 Psychotic disorder with delusions due to known physiological condition: Secondary | ICD-10-CM | POA: Diagnosis not present

## 2018-07-14 DIAGNOSIS — R1310 Dysphagia, unspecified: Secondary | ICD-10-CM | POA: Diagnosis not present

## 2018-07-14 DIAGNOSIS — I639 Cerebral infarction, unspecified: Secondary | ICD-10-CM | POA: Diagnosis not present

## 2018-07-15 DIAGNOSIS — R1310 Dysphagia, unspecified: Secondary | ICD-10-CM | POA: Diagnosis not present

## 2018-07-15 DIAGNOSIS — I639 Cerebral infarction, unspecified: Secondary | ICD-10-CM | POA: Diagnosis not present

## 2018-07-16 DIAGNOSIS — R1310 Dysphagia, unspecified: Secondary | ICD-10-CM | POA: Diagnosis not present

## 2018-07-16 DIAGNOSIS — I639 Cerebral infarction, unspecified: Secondary | ICD-10-CM | POA: Diagnosis not present

## 2018-07-18 DIAGNOSIS — I639 Cerebral infarction, unspecified: Secondary | ICD-10-CM | POA: Diagnosis not present

## 2018-07-18 DIAGNOSIS — R1310 Dysphagia, unspecified: Secondary | ICD-10-CM | POA: Diagnosis not present

## 2018-07-19 DIAGNOSIS — I639 Cerebral infarction, unspecified: Secondary | ICD-10-CM | POA: Diagnosis not present

## 2018-07-19 DIAGNOSIS — R1310 Dysphagia, unspecified: Secondary | ICD-10-CM | POA: Diagnosis not present

## 2018-07-20 DIAGNOSIS — R1310 Dysphagia, unspecified: Secondary | ICD-10-CM | POA: Diagnosis not present

## 2018-07-20 DIAGNOSIS — I639 Cerebral infarction, unspecified: Secondary | ICD-10-CM | POA: Diagnosis not present

## 2018-07-21 DIAGNOSIS — I639 Cerebral infarction, unspecified: Secondary | ICD-10-CM | POA: Diagnosis not present

## 2018-07-21 DIAGNOSIS — R1310 Dysphagia, unspecified: Secondary | ICD-10-CM | POA: Diagnosis not present

## 2018-07-22 DIAGNOSIS — R1310 Dysphagia, unspecified: Secondary | ICD-10-CM | POA: Diagnosis not present

## 2018-07-22 DIAGNOSIS — I639 Cerebral infarction, unspecified: Secondary | ICD-10-CM | POA: Diagnosis not present

## 2018-07-25 DIAGNOSIS — R1310 Dysphagia, unspecified: Secondary | ICD-10-CM | POA: Diagnosis not present

## 2018-07-25 DIAGNOSIS — I639 Cerebral infarction, unspecified: Secondary | ICD-10-CM | POA: Diagnosis not present

## 2018-07-26 DIAGNOSIS — D649 Anemia, unspecified: Secondary | ICD-10-CM | POA: Diagnosis not present

## 2018-07-26 DIAGNOSIS — Z79899 Other long term (current) drug therapy: Secondary | ICD-10-CM | POA: Diagnosis not present

## 2018-07-26 DIAGNOSIS — I639 Cerebral infarction, unspecified: Secondary | ICD-10-CM | POA: Diagnosis not present

## 2018-07-26 DIAGNOSIS — R1310 Dysphagia, unspecified: Secondary | ICD-10-CM | POA: Diagnosis not present

## 2018-07-27 DIAGNOSIS — R1310 Dysphagia, unspecified: Secondary | ICD-10-CM | POA: Diagnosis not present

## 2018-07-27 DIAGNOSIS — I639 Cerebral infarction, unspecified: Secondary | ICD-10-CM | POA: Diagnosis not present

## 2018-07-28 DIAGNOSIS — I739 Peripheral vascular disease, unspecified: Secondary | ICD-10-CM | POA: Diagnosis not present

## 2018-07-28 DIAGNOSIS — B351 Tinea unguium: Secondary | ICD-10-CM | POA: Diagnosis not present

## 2018-07-28 DIAGNOSIS — I639 Cerebral infarction, unspecified: Secondary | ICD-10-CM | POA: Diagnosis not present

## 2018-07-28 DIAGNOSIS — R1310 Dysphagia, unspecified: Secondary | ICD-10-CM | POA: Diagnosis not present

## 2018-07-29 DIAGNOSIS — I639 Cerebral infarction, unspecified: Secondary | ICD-10-CM | POA: Diagnosis not present

## 2018-07-29 DIAGNOSIS — R1310 Dysphagia, unspecified: Secondary | ICD-10-CM | POA: Diagnosis not present

## 2018-08-01 DIAGNOSIS — R1310 Dysphagia, unspecified: Secondary | ICD-10-CM | POA: Diagnosis not present

## 2018-08-01 DIAGNOSIS — I639 Cerebral infarction, unspecified: Secondary | ICD-10-CM | POA: Diagnosis not present

## 2018-08-02 DIAGNOSIS — I639 Cerebral infarction, unspecified: Secondary | ICD-10-CM | POA: Diagnosis not present

## 2018-08-02 DIAGNOSIS — R1310 Dysphagia, unspecified: Secondary | ICD-10-CM | POA: Diagnosis not present

## 2018-08-03 DIAGNOSIS — I639 Cerebral infarction, unspecified: Secondary | ICD-10-CM | POA: Diagnosis not present

## 2018-08-03 DIAGNOSIS — R1310 Dysphagia, unspecified: Secondary | ICD-10-CM | POA: Diagnosis not present

## 2018-08-04 DIAGNOSIS — R1310 Dysphagia, unspecified: Secondary | ICD-10-CM | POA: Diagnosis not present

## 2018-08-04 DIAGNOSIS — I639 Cerebral infarction, unspecified: Secondary | ICD-10-CM | POA: Diagnosis not present

## 2018-08-05 DIAGNOSIS — I639 Cerebral infarction, unspecified: Secondary | ICD-10-CM | POA: Diagnosis not present

## 2018-08-05 DIAGNOSIS — R1310 Dysphagia, unspecified: Secondary | ICD-10-CM | POA: Diagnosis not present

## 2018-08-08 DIAGNOSIS — R1310 Dysphagia, unspecified: Secondary | ICD-10-CM | POA: Diagnosis not present

## 2018-08-08 DIAGNOSIS — I639 Cerebral infarction, unspecified: Secondary | ICD-10-CM | POA: Diagnosis not present

## 2018-08-09 DIAGNOSIS — G8190 Hemiplegia, unspecified affecting unspecified side: Secondary | ICD-10-CM | POA: Diagnosis not present

## 2018-08-09 DIAGNOSIS — R627 Adult failure to thrive: Secondary | ICD-10-CM | POA: Diagnosis not present

## 2018-08-09 DIAGNOSIS — I739 Peripheral vascular disease, unspecified: Secondary | ICD-10-CM | POA: Diagnosis not present

## 2018-08-09 DIAGNOSIS — F0281 Dementia in other diseases classified elsewhere with behavioral disturbance: Secondary | ICD-10-CM | POA: Diagnosis not present

## 2018-08-10 DIAGNOSIS — I639 Cerebral infarction, unspecified: Secondary | ICD-10-CM | POA: Diagnosis not present

## 2018-08-10 DIAGNOSIS — R1310 Dysphagia, unspecified: Secondary | ICD-10-CM | POA: Diagnosis not present

## 2018-08-11 DIAGNOSIS — R1310 Dysphagia, unspecified: Secondary | ICD-10-CM | POA: Diagnosis not present

## 2018-08-11 DIAGNOSIS — I639 Cerebral infarction, unspecified: Secondary | ICD-10-CM | POA: Diagnosis not present

## 2018-08-12 DIAGNOSIS — I639 Cerebral infarction, unspecified: Secondary | ICD-10-CM | POA: Diagnosis not present

## 2018-08-12 DIAGNOSIS — R1310 Dysphagia, unspecified: Secondary | ICD-10-CM | POA: Diagnosis not present

## 2018-08-16 DIAGNOSIS — F0151 Vascular dementia with behavioral disturbance: Secondary | ICD-10-CM | POA: Diagnosis not present

## 2018-08-16 DIAGNOSIS — F062 Psychotic disorder with delusions due to known physiological condition: Secondary | ICD-10-CM | POA: Diagnosis not present

## 2018-08-16 DIAGNOSIS — F331 Major depressive disorder, recurrent, moderate: Secondary | ICD-10-CM | POA: Diagnosis not present

## 2018-08-22 DIAGNOSIS — I739 Peripheral vascular disease, unspecified: Secondary | ICD-10-CM | POA: Diagnosis not present

## 2018-08-22 DIAGNOSIS — I10 Essential (primary) hypertension: Secondary | ICD-10-CM | POA: Diagnosis not present

## 2018-08-22 DIAGNOSIS — M25561 Pain in right knee: Secondary | ICD-10-CM | POA: Diagnosis not present

## 2018-08-22 DIAGNOSIS — R627 Adult failure to thrive: Secondary | ICD-10-CM | POA: Diagnosis not present

## 2018-09-06 DIAGNOSIS — E7800 Pure hypercholesterolemia, unspecified: Secondary | ICD-10-CM | POA: Diagnosis not present

## 2018-09-06 DIAGNOSIS — D649 Anemia, unspecified: Secondary | ICD-10-CM | POA: Diagnosis not present

## 2018-09-08 DIAGNOSIS — I639 Cerebral infarction, unspecified: Secondary | ICD-10-CM | POA: Diagnosis not present

## 2018-09-09 DIAGNOSIS — R634 Abnormal weight loss: Secondary | ICD-10-CM | POA: Diagnosis not present

## 2018-09-09 DIAGNOSIS — I639 Cerebral infarction, unspecified: Secondary | ICD-10-CM | POA: Diagnosis not present

## 2018-09-09 DIAGNOSIS — R630 Anorexia: Secondary | ICD-10-CM | POA: Diagnosis not present

## 2018-09-09 DIAGNOSIS — R627 Adult failure to thrive: Secondary | ICD-10-CM | POA: Diagnosis not present

## 2018-09-12 DIAGNOSIS — Z1383 Encounter for screening for respiratory disorder NEC: Secondary | ICD-10-CM | POA: Diagnosis not present

## 2018-09-12 DIAGNOSIS — Z20828 Contact with and (suspected) exposure to other viral communicable diseases: Secondary | ICD-10-CM | POA: Diagnosis not present

## 2018-09-21 DIAGNOSIS — F331 Major depressive disorder, recurrent, moderate: Secondary | ICD-10-CM | POA: Diagnosis not present

## 2018-09-21 DIAGNOSIS — F062 Psychotic disorder with delusions due to known physiological condition: Secondary | ICD-10-CM | POA: Diagnosis not present

## 2018-09-21 DIAGNOSIS — F0151 Vascular dementia with behavioral disturbance: Secondary | ICD-10-CM | POA: Diagnosis not present

## 2018-09-27 DIAGNOSIS — D649 Anemia, unspecified: Secondary | ICD-10-CM | POA: Diagnosis not present

## 2018-10-06 DIAGNOSIS — I739 Peripheral vascular disease, unspecified: Secondary | ICD-10-CM | POA: Diagnosis not present

## 2018-10-06 DIAGNOSIS — B351 Tinea unguium: Secondary | ICD-10-CM | POA: Diagnosis not present

## 2018-10-12 DIAGNOSIS — I10 Essential (primary) hypertension: Secondary | ICD-10-CM | POA: Diagnosis not present

## 2018-10-12 DIAGNOSIS — L98499 Non-pressure chronic ulcer of skin of other sites with unspecified severity: Secondary | ICD-10-CM | POA: Diagnosis not present

## 2018-10-12 DIAGNOSIS — Z1383 Encounter for screening for respiratory disorder NEC: Secondary | ICD-10-CM | POA: Diagnosis not present

## 2018-10-12 DIAGNOSIS — R634 Abnormal weight loss: Secondary | ICD-10-CM | POA: Diagnosis not present

## 2018-10-12 DIAGNOSIS — I639 Cerebral infarction, unspecified: Secondary | ICD-10-CM | POA: Diagnosis not present

## 2018-10-12 DIAGNOSIS — R627 Adult failure to thrive: Secondary | ICD-10-CM | POA: Diagnosis not present

## 2018-10-12 DIAGNOSIS — Z20828 Contact with and (suspected) exposure to other viral communicable diseases: Secondary | ICD-10-CM | POA: Diagnosis not present

## 2018-10-12 DIAGNOSIS — M6281 Muscle weakness (generalized): Secondary | ICD-10-CM | POA: Diagnosis not present

## 2018-10-13 DIAGNOSIS — I639 Cerebral infarction, unspecified: Secondary | ICD-10-CM | POA: Diagnosis not present

## 2018-10-13 DIAGNOSIS — M6281 Muscle weakness (generalized): Secondary | ICD-10-CM | POA: Diagnosis not present

## 2018-10-14 DIAGNOSIS — M6281 Muscle weakness (generalized): Secondary | ICD-10-CM | POA: Diagnosis not present

## 2018-10-14 DIAGNOSIS — I639 Cerebral infarction, unspecified: Secondary | ICD-10-CM | POA: Diagnosis not present

## 2018-10-17 DIAGNOSIS — M6281 Muscle weakness (generalized): Secondary | ICD-10-CM | POA: Diagnosis not present

## 2018-10-17 DIAGNOSIS — I639 Cerebral infarction, unspecified: Secondary | ICD-10-CM | POA: Diagnosis not present

## 2018-10-18 DIAGNOSIS — I639 Cerebral infarction, unspecified: Secondary | ICD-10-CM | POA: Diagnosis not present

## 2018-10-18 DIAGNOSIS — M6281 Muscle weakness (generalized): Secondary | ICD-10-CM | POA: Diagnosis not present

## 2018-10-19 DIAGNOSIS — I639 Cerebral infarction, unspecified: Secondary | ICD-10-CM | POA: Diagnosis not present

## 2018-10-19 DIAGNOSIS — F331 Major depressive disorder, recurrent, moderate: Secondary | ICD-10-CM | POA: Diagnosis not present

## 2018-10-19 DIAGNOSIS — M6281 Muscle weakness (generalized): Secondary | ICD-10-CM | POA: Diagnosis not present

## 2018-10-19 DIAGNOSIS — F0151 Vascular dementia with behavioral disturbance: Secondary | ICD-10-CM | POA: Diagnosis not present

## 2018-10-19 DIAGNOSIS — F062 Psychotic disorder with delusions due to known physiological condition: Secondary | ICD-10-CM | POA: Diagnosis not present

## 2018-10-20 DIAGNOSIS — M6281 Muscle weakness (generalized): Secondary | ICD-10-CM | POA: Diagnosis not present

## 2018-10-20 DIAGNOSIS — I639 Cerebral infarction, unspecified: Secondary | ICD-10-CM | POA: Diagnosis not present

## 2018-10-21 DIAGNOSIS — M6281 Muscle weakness (generalized): Secondary | ICD-10-CM | POA: Diagnosis not present

## 2018-10-21 DIAGNOSIS — I639 Cerebral infarction, unspecified: Secondary | ICD-10-CM | POA: Diagnosis not present

## 2018-10-22 DIAGNOSIS — I639 Cerebral infarction, unspecified: Secondary | ICD-10-CM | POA: Diagnosis not present

## 2018-10-22 DIAGNOSIS — M6281 Muscle weakness (generalized): Secondary | ICD-10-CM | POA: Diagnosis not present

## 2018-10-24 DIAGNOSIS — I639 Cerebral infarction, unspecified: Secondary | ICD-10-CM | POA: Diagnosis not present

## 2018-10-24 DIAGNOSIS — M6281 Muscle weakness (generalized): Secondary | ICD-10-CM | POA: Diagnosis not present

## 2018-10-25 DIAGNOSIS — M6281 Muscle weakness (generalized): Secondary | ICD-10-CM | POA: Diagnosis not present

## 2018-10-25 DIAGNOSIS — I639 Cerebral infarction, unspecified: Secondary | ICD-10-CM | POA: Diagnosis not present

## 2018-10-26 DIAGNOSIS — I639 Cerebral infarction, unspecified: Secondary | ICD-10-CM | POA: Diagnosis not present

## 2018-10-26 DIAGNOSIS — M6281 Muscle weakness (generalized): Secondary | ICD-10-CM | POA: Diagnosis not present

## 2018-10-27 DIAGNOSIS — M6281 Muscle weakness (generalized): Secondary | ICD-10-CM | POA: Diagnosis not present

## 2018-10-27 DIAGNOSIS — I639 Cerebral infarction, unspecified: Secondary | ICD-10-CM | POA: Diagnosis not present

## 2018-10-28 DIAGNOSIS — I639 Cerebral infarction, unspecified: Secondary | ICD-10-CM | POA: Diagnosis not present

## 2018-10-28 DIAGNOSIS — M6281 Muscle weakness (generalized): Secondary | ICD-10-CM | POA: Diagnosis not present

## 2018-10-29 DIAGNOSIS — I639 Cerebral infarction, unspecified: Secondary | ICD-10-CM | POA: Diagnosis not present

## 2018-10-29 DIAGNOSIS — M6281 Muscle weakness (generalized): Secondary | ICD-10-CM | POA: Diagnosis not present

## 2018-10-30 DIAGNOSIS — I639 Cerebral infarction, unspecified: Secondary | ICD-10-CM | POA: Diagnosis not present

## 2018-10-30 DIAGNOSIS — M6281 Muscle weakness (generalized): Secondary | ICD-10-CM | POA: Diagnosis not present

## 2018-10-31 DIAGNOSIS — I639 Cerebral infarction, unspecified: Secondary | ICD-10-CM | POA: Diagnosis not present

## 2018-10-31 DIAGNOSIS — M6281 Muscle weakness (generalized): Secondary | ICD-10-CM | POA: Diagnosis not present

## 2018-11-01 DIAGNOSIS — I639 Cerebral infarction, unspecified: Secondary | ICD-10-CM | POA: Diagnosis not present

## 2018-11-01 DIAGNOSIS — M6281 Muscle weakness (generalized): Secondary | ICD-10-CM | POA: Diagnosis not present

## 2018-11-02 DIAGNOSIS — Z79899 Other long term (current) drug therapy: Secondary | ICD-10-CM | POA: Diagnosis not present

## 2018-11-02 DIAGNOSIS — E7800 Pure hypercholesterolemia, unspecified: Secondary | ICD-10-CM | POA: Diagnosis not present

## 2018-11-02 DIAGNOSIS — D649 Anemia, unspecified: Secondary | ICD-10-CM | POA: Diagnosis not present

## 2018-11-02 DIAGNOSIS — M6281 Muscle weakness (generalized): Secondary | ICD-10-CM | POA: Diagnosis not present

## 2018-11-02 DIAGNOSIS — I639 Cerebral infarction, unspecified: Secondary | ICD-10-CM | POA: Diagnosis not present

## 2018-11-03 DIAGNOSIS — I639 Cerebral infarction, unspecified: Secondary | ICD-10-CM | POA: Diagnosis not present

## 2018-11-03 DIAGNOSIS — M6281 Muscle weakness (generalized): Secondary | ICD-10-CM | POA: Diagnosis not present

## 2018-11-04 DIAGNOSIS — M6281 Muscle weakness (generalized): Secondary | ICD-10-CM | POA: Diagnosis not present

## 2018-11-04 DIAGNOSIS — I639 Cerebral infarction, unspecified: Secondary | ICD-10-CM | POA: Diagnosis not present

## 2018-11-05 DIAGNOSIS — I639 Cerebral infarction, unspecified: Secondary | ICD-10-CM | POA: Diagnosis not present

## 2018-11-05 DIAGNOSIS — M6281 Muscle weakness (generalized): Secondary | ICD-10-CM | POA: Diagnosis not present

## 2018-11-07 DIAGNOSIS — I639 Cerebral infarction, unspecified: Secondary | ICD-10-CM | POA: Diagnosis not present

## 2018-11-07 DIAGNOSIS — M6281 Muscle weakness (generalized): Secondary | ICD-10-CM | POA: Diagnosis not present

## 2018-11-08 DIAGNOSIS — M6281 Muscle weakness (generalized): Secondary | ICD-10-CM | POA: Diagnosis not present

## 2018-11-08 DIAGNOSIS — I639 Cerebral infarction, unspecified: Secondary | ICD-10-CM | POA: Diagnosis not present

## 2018-11-09 DIAGNOSIS — I639 Cerebral infarction, unspecified: Secondary | ICD-10-CM | POA: Diagnosis not present

## 2018-11-09 DIAGNOSIS — M6281 Muscle weakness (generalized): Secondary | ICD-10-CM | POA: Diagnosis not present

## 2018-11-10 DIAGNOSIS — I639 Cerebral infarction, unspecified: Secondary | ICD-10-CM | POA: Diagnosis not present

## 2018-11-10 DIAGNOSIS — M6281 Muscle weakness (generalized): Secondary | ICD-10-CM | POA: Diagnosis not present

## 2018-11-11 DIAGNOSIS — M6281 Muscle weakness (generalized): Secondary | ICD-10-CM | POA: Diagnosis not present

## 2018-11-11 DIAGNOSIS — I639 Cerebral infarction, unspecified: Secondary | ICD-10-CM | POA: Diagnosis not present

## 2018-11-14 DIAGNOSIS — M6281 Muscle weakness (generalized): Secondary | ICD-10-CM | POA: Diagnosis not present

## 2018-11-14 DIAGNOSIS — I639 Cerebral infarction, unspecified: Secondary | ICD-10-CM | POA: Diagnosis not present

## 2018-11-15 DIAGNOSIS — M6281 Muscle weakness (generalized): Secondary | ICD-10-CM | POA: Diagnosis not present

## 2018-11-15 DIAGNOSIS — I639 Cerebral infarction, unspecified: Secondary | ICD-10-CM | POA: Diagnosis not present

## 2018-11-16 DIAGNOSIS — L98499 Non-pressure chronic ulcer of skin of other sites with unspecified severity: Secondary | ICD-10-CM | POA: Diagnosis not present

## 2018-11-16 DIAGNOSIS — M6281 Muscle weakness (generalized): Secondary | ICD-10-CM | POA: Diagnosis not present

## 2018-11-16 DIAGNOSIS — R627 Adult failure to thrive: Secondary | ICD-10-CM | POA: Diagnosis not present

## 2018-11-16 DIAGNOSIS — I10 Essential (primary) hypertension: Secondary | ICD-10-CM | POA: Diagnosis not present

## 2018-11-16 DIAGNOSIS — I739 Peripheral vascular disease, unspecified: Secondary | ICD-10-CM | POA: Diagnosis not present

## 2018-11-16 DIAGNOSIS — N183 Chronic kidney disease, stage 3 (moderate): Secondary | ICD-10-CM | POA: Diagnosis not present

## 2018-11-16 DIAGNOSIS — I639 Cerebral infarction, unspecified: Secondary | ICD-10-CM | POA: Diagnosis not present

## 2018-11-17 DIAGNOSIS — I639 Cerebral infarction, unspecified: Secondary | ICD-10-CM | POA: Diagnosis not present

## 2018-11-17 DIAGNOSIS — M6281 Muscle weakness (generalized): Secondary | ICD-10-CM | POA: Diagnosis not present

## 2018-11-18 DIAGNOSIS — M6281 Muscle weakness (generalized): Secondary | ICD-10-CM | POA: Diagnosis not present

## 2018-11-18 DIAGNOSIS — Z20828 Contact with and (suspected) exposure to other viral communicable diseases: Secondary | ICD-10-CM | POA: Diagnosis not present

## 2018-11-18 DIAGNOSIS — I639 Cerebral infarction, unspecified: Secondary | ICD-10-CM | POA: Diagnosis not present

## 2018-11-18 DIAGNOSIS — Z1383 Encounter for screening for respiratory disorder NEC: Secondary | ICD-10-CM | POA: Diagnosis not present

## 2018-11-18 DIAGNOSIS — R197 Diarrhea, unspecified: Secondary | ICD-10-CM | POA: Diagnosis not present

## 2018-11-18 DIAGNOSIS — D688 Other specified coagulation defects: Secondary | ICD-10-CM | POA: Diagnosis not present

## 2018-11-21 DIAGNOSIS — I639 Cerebral infarction, unspecified: Secondary | ICD-10-CM | POA: Diagnosis not present

## 2018-11-21 DIAGNOSIS — M6281 Muscle weakness (generalized): Secondary | ICD-10-CM | POA: Diagnosis not present

## 2018-11-21 DIAGNOSIS — N390 Urinary tract infection, site not specified: Secondary | ICD-10-CM | POA: Diagnosis not present

## 2018-11-22 DIAGNOSIS — I639 Cerebral infarction, unspecified: Secondary | ICD-10-CM | POA: Diagnosis not present

## 2018-11-22 DIAGNOSIS — M6281 Muscle weakness (generalized): Secondary | ICD-10-CM | POA: Diagnosis not present

## 2018-11-23 DIAGNOSIS — I639 Cerebral infarction, unspecified: Secondary | ICD-10-CM | POA: Diagnosis not present

## 2018-11-23 DIAGNOSIS — M6281 Muscle weakness (generalized): Secondary | ICD-10-CM | POA: Diagnosis not present

## 2018-11-24 DIAGNOSIS — R627 Adult failure to thrive: Secondary | ICD-10-CM | POA: Diagnosis not present

## 2018-11-24 DIAGNOSIS — N189 Chronic kidney disease, unspecified: Secondary | ICD-10-CM | POA: Diagnosis not present

## 2018-11-24 DIAGNOSIS — F062 Psychotic disorder with delusions due to known physiological condition: Secondary | ICD-10-CM | POA: Diagnosis not present

## 2018-11-24 DIAGNOSIS — R41841 Cognitive communication deficit: Secondary | ICD-10-CM | POA: Diagnosis not present

## 2018-11-24 DIAGNOSIS — F331 Major depressive disorder, recurrent, moderate: Secondary | ICD-10-CM | POA: Diagnosis not present

## 2018-11-24 DIAGNOSIS — R630 Anorexia: Secondary | ICD-10-CM | POA: Diagnosis not present

## 2018-11-24 DIAGNOSIS — I639 Cerebral infarction, unspecified: Secondary | ICD-10-CM | POA: Diagnosis not present

## 2018-11-24 DIAGNOSIS — I10 Essential (primary) hypertension: Secondary | ICD-10-CM | POA: Diagnosis not present

## 2018-11-24 DIAGNOSIS — U071 COVID-19: Secondary | ICD-10-CM | POA: Diagnosis not present

## 2018-11-24 DIAGNOSIS — R0602 Shortness of breath: Secondary | ICD-10-CM | POA: Diagnosis not present

## 2018-11-24 DIAGNOSIS — N281 Cyst of kidney, acquired: Secondary | ICD-10-CM | POA: Diagnosis not present

## 2018-11-24 DIAGNOSIS — E860 Dehydration: Secondary | ICD-10-CM | POA: Diagnosis not present

## 2018-11-24 DIAGNOSIS — D649 Anemia, unspecified: Secondary | ICD-10-CM | POA: Diagnosis not present

## 2018-11-24 DIAGNOSIS — N183 Chronic kidney disease, stage 3 (moderate): Secondary | ICD-10-CM | POA: Diagnosis not present

## 2018-11-24 DIAGNOSIS — Z20828 Contact with and (suspected) exposure to other viral communicable diseases: Secondary | ICD-10-CM | POA: Diagnosis not present

## 2018-11-24 DIAGNOSIS — Z79899 Other long term (current) drug therapy: Secondary | ICD-10-CM | POA: Diagnosis not present

## 2018-11-24 DIAGNOSIS — N390 Urinary tract infection, site not specified: Secondary | ICD-10-CM | POA: Diagnosis not present

## 2018-11-24 DIAGNOSIS — I739 Peripheral vascular disease, unspecified: Secondary | ICD-10-CM | POA: Diagnosis not present

## 2018-11-24 DIAGNOSIS — R1311 Dysphagia, oral phase: Secondary | ICD-10-CM | POA: Diagnosis not present

## 2018-11-24 DIAGNOSIS — Z1383 Encounter for screening for respiratory disorder NEC: Secondary | ICD-10-CM | POA: Diagnosis not present

## 2018-11-24 DIAGNOSIS — M25561 Pain in right knee: Secondary | ICD-10-CM | POA: Diagnosis not present

## 2018-11-24 DIAGNOSIS — I69391 Dysphagia following cerebral infarction: Secondary | ICD-10-CM | POA: Diagnosis not present

## 2018-11-24 DIAGNOSIS — E785 Hyperlipidemia, unspecified: Secondary | ICD-10-CM | POA: Diagnosis not present

## 2018-11-24 DIAGNOSIS — F0281 Dementia in other diseases classified elsewhere with behavioral disturbance: Secondary | ICD-10-CM | POA: Diagnosis not present

## 2018-11-24 DIAGNOSIS — I69351 Hemiplegia and hemiparesis following cerebral infarction affecting right dominant side: Secondary | ICD-10-CM | POA: Diagnosis not present

## 2018-12-01 DIAGNOSIS — N183 Chronic kidney disease, stage 3 (moderate): Secondary | ICD-10-CM | POA: Diagnosis not present

## 2018-12-01 DIAGNOSIS — I10 Essential (primary) hypertension: Secondary | ICD-10-CM | POA: Diagnosis not present

## 2018-12-01 DIAGNOSIS — U071 COVID-19: Secondary | ICD-10-CM | POA: Diagnosis not present

## 2018-12-01 DIAGNOSIS — I739 Peripheral vascular disease, unspecified: Secondary | ICD-10-CM | POA: Diagnosis not present

## 2018-12-01 DIAGNOSIS — I639 Cerebral infarction, unspecified: Secondary | ICD-10-CM | POA: Diagnosis not present

## 2018-12-05 DIAGNOSIS — Z1383 Encounter for screening for respiratory disorder NEC: Secondary | ICD-10-CM | POA: Diagnosis not present

## 2018-12-05 DIAGNOSIS — Z20828 Contact with and (suspected) exposure to other viral communicable diseases: Secondary | ICD-10-CM | POA: Diagnosis not present

## 2018-12-08 DIAGNOSIS — I639 Cerebral infarction, unspecified: Secondary | ICD-10-CM | POA: Diagnosis not present

## 2018-12-08 DIAGNOSIS — N390 Urinary tract infection, site not specified: Secondary | ICD-10-CM | POA: Diagnosis not present

## 2018-12-08 DIAGNOSIS — D649 Anemia, unspecified: Secondary | ICD-10-CM | POA: Diagnosis not present

## 2018-12-08 DIAGNOSIS — I10 Essential (primary) hypertension: Secondary | ICD-10-CM | POA: Diagnosis not present

## 2018-12-08 DIAGNOSIS — I739 Peripheral vascular disease, unspecified: Secondary | ICD-10-CM | POA: Diagnosis not present

## 2018-12-08 DIAGNOSIS — R627 Adult failure to thrive: Secondary | ICD-10-CM | POA: Diagnosis not present

## 2018-12-08 DIAGNOSIS — U071 COVID-19: Secondary | ICD-10-CM | POA: Diagnosis not present

## 2018-12-08 DIAGNOSIS — N183 Chronic kidney disease, stage 3 (moderate): Secondary | ICD-10-CM | POA: Diagnosis not present

## 2018-12-14 DIAGNOSIS — I739 Peripheral vascular disease, unspecified: Secondary | ICD-10-CM | POA: Diagnosis not present

## 2018-12-14 DIAGNOSIS — U071 COVID-19: Secondary | ICD-10-CM | POA: Diagnosis not present

## 2018-12-14 DIAGNOSIS — F0281 Dementia in other diseases classified elsewhere with behavioral disturbance: Secondary | ICD-10-CM | POA: Diagnosis not present

## 2018-12-14 DIAGNOSIS — H2513 Age-related nuclear cataract, bilateral: Secondary | ICD-10-CM | POA: Diagnosis not present

## 2018-12-14 DIAGNOSIS — N183 Chronic kidney disease, stage 3 (moderate): Secondary | ICD-10-CM | POA: Diagnosis not present

## 2018-12-14 DIAGNOSIS — R4587 Impulsiveness: Secondary | ICD-10-CM | POA: Diagnosis not present

## 2018-12-14 DIAGNOSIS — F331 Major depressive disorder, recurrent, moderate: Secondary | ICD-10-CM | POA: Diagnosis not present

## 2018-12-14 DIAGNOSIS — K5900 Constipation, unspecified: Secondary | ICD-10-CM | POA: Diagnosis not present

## 2018-12-14 DIAGNOSIS — F0151 Vascular dementia with behavioral disturbance: Secondary | ICD-10-CM | POA: Diagnosis not present

## 2018-12-14 DIAGNOSIS — R627 Adult failure to thrive: Secondary | ICD-10-CM | POA: Diagnosis not present

## 2018-12-14 DIAGNOSIS — I10 Essential (primary) hypertension: Secondary | ICD-10-CM | POA: Diagnosis not present

## 2018-12-14 DIAGNOSIS — I5032 Chronic diastolic (congestive) heart failure: Secondary | ICD-10-CM | POA: Diagnosis not present

## 2018-12-14 DIAGNOSIS — L98499 Non-pressure chronic ulcer of skin of other sites with unspecified severity: Secondary | ICD-10-CM | POA: Diagnosis not present

## 2018-12-14 DIAGNOSIS — R4586 Emotional lability: Secondary | ICD-10-CM | POA: Diagnosis not present

## 2018-12-14 DIAGNOSIS — I639 Cerebral infarction, unspecified: Secondary | ICD-10-CM | POA: Diagnosis not present

## 2018-12-14 DIAGNOSIS — I69351 Hemiplegia and hemiparesis following cerebral infarction affecting right dominant side: Secondary | ICD-10-CM | POA: Diagnosis not present

## 2018-12-14 DIAGNOSIS — I69391 Dysphagia following cerebral infarction: Secondary | ICD-10-CM | POA: Diagnosis not present

## 2018-12-14 DIAGNOSIS — F062 Psychotic disorder with delusions due to known physiological condition: Secondary | ICD-10-CM | POA: Diagnosis not present

## 2018-12-14 DIAGNOSIS — K130 Diseases of lips: Secondary | ICD-10-CM | POA: Diagnosis not present

## 2018-12-14 DIAGNOSIS — R630 Anorexia: Secondary | ICD-10-CM | POA: Diagnosis not present

## 2018-12-14 DIAGNOSIS — D649 Anemia, unspecified: Secondary | ICD-10-CM | POA: Diagnosis not present

## 2018-12-14 DIAGNOSIS — M25569 Pain in unspecified knee: Secondary | ICD-10-CM | POA: Diagnosis not present

## 2018-12-14 DIAGNOSIS — N390 Urinary tract infection, site not specified: Secondary | ICD-10-CM | POA: Diagnosis not present

## 2018-12-14 DIAGNOSIS — E785 Hyperlipidemia, unspecified: Secondary | ICD-10-CM | POA: Diagnosis not present

## 2018-12-16 DIAGNOSIS — R627 Adult failure to thrive: Secondary | ICD-10-CM | POA: Diagnosis not present

## 2018-12-16 DIAGNOSIS — I10 Essential (primary) hypertension: Secondary | ICD-10-CM | POA: Diagnosis not present

## 2018-12-16 DIAGNOSIS — I5032 Chronic diastolic (congestive) heart failure: Secondary | ICD-10-CM | POA: Diagnosis not present

## 2018-12-16 DIAGNOSIS — U071 COVID-19: Secondary | ICD-10-CM | POA: Diagnosis not present

## 2018-12-16 DIAGNOSIS — F0151 Vascular dementia with behavioral disturbance: Secondary | ICD-10-CM | POA: Diagnosis not present

## 2018-12-16 DIAGNOSIS — I639 Cerebral infarction, unspecified: Secondary | ICD-10-CM | POA: Diagnosis not present

## 2018-12-16 DIAGNOSIS — N183 Chronic kidney disease, stage 3 (moderate): Secondary | ICD-10-CM | POA: Diagnosis not present

## 2018-12-16 DIAGNOSIS — I69351 Hemiplegia and hemiparesis following cerebral infarction affecting right dominant side: Secondary | ICD-10-CM | POA: Diagnosis not present

## 2018-12-16 DIAGNOSIS — N390 Urinary tract infection, site not specified: Secondary | ICD-10-CM | POA: Diagnosis not present

## 2018-12-20 DIAGNOSIS — I69351 Hemiplegia and hemiparesis following cerebral infarction affecting right dominant side: Secondary | ICD-10-CM | POA: Diagnosis not present

## 2018-12-20 DIAGNOSIS — N183 Chronic kidney disease, stage 3 (moderate): Secondary | ICD-10-CM | POA: Diagnosis not present

## 2018-12-20 DIAGNOSIS — U071 COVID-19: Secondary | ICD-10-CM | POA: Diagnosis not present

## 2018-12-20 DIAGNOSIS — I639 Cerebral infarction, unspecified: Secondary | ICD-10-CM | POA: Diagnosis not present

## 2018-12-20 DIAGNOSIS — F0151 Vascular dementia with behavioral disturbance: Secondary | ICD-10-CM | POA: Diagnosis not present

## 2018-12-20 DIAGNOSIS — I739 Peripheral vascular disease, unspecified: Secondary | ICD-10-CM | POA: Diagnosis not present

## 2018-12-20 DIAGNOSIS — R627 Adult failure to thrive: Secondary | ICD-10-CM | POA: Diagnosis not present

## 2018-12-28 DIAGNOSIS — I69351 Hemiplegia and hemiparesis following cerebral infarction affecting right dominant side: Secondary | ICD-10-CM | POA: Diagnosis not present

## 2018-12-28 DIAGNOSIS — I5032 Chronic diastolic (congestive) heart failure: Secondary | ICD-10-CM | POA: Diagnosis not present

## 2018-12-28 DIAGNOSIS — I739 Peripheral vascular disease, unspecified: Secondary | ICD-10-CM | POA: Diagnosis not present

## 2018-12-28 DIAGNOSIS — F0151 Vascular dementia with behavioral disturbance: Secondary | ICD-10-CM | POA: Diagnosis not present

## 2018-12-28 DIAGNOSIS — N183 Chronic kidney disease, stage 3 (moderate): Secondary | ICD-10-CM | POA: Diagnosis not present

## 2018-12-28 DIAGNOSIS — U071 COVID-19: Secondary | ICD-10-CM | POA: Diagnosis not present

## 2018-12-30 DIAGNOSIS — R278 Other lack of coordination: Secondary | ICD-10-CM | POA: Diagnosis not present

## 2018-12-30 DIAGNOSIS — I639 Cerebral infarction, unspecified: Secondary | ICD-10-CM | POA: Diagnosis not present

## 2018-12-31 DIAGNOSIS — R278 Other lack of coordination: Secondary | ICD-10-CM | POA: Diagnosis not present

## 2018-12-31 DIAGNOSIS — I639 Cerebral infarction, unspecified: Secondary | ICD-10-CM | POA: Diagnosis not present

## 2019-01-01 DIAGNOSIS — I639 Cerebral infarction, unspecified: Secondary | ICD-10-CM | POA: Diagnosis not present

## 2019-01-01 DIAGNOSIS — R278 Other lack of coordination: Secondary | ICD-10-CM | POA: Diagnosis not present

## 2019-01-02 DIAGNOSIS — I639 Cerebral infarction, unspecified: Secondary | ICD-10-CM | POA: Diagnosis not present

## 2019-01-02 DIAGNOSIS — D649 Anemia, unspecified: Secondary | ICD-10-CM | POA: Diagnosis not present

## 2019-01-02 DIAGNOSIS — R278 Other lack of coordination: Secondary | ICD-10-CM | POA: Diagnosis not present

## 2019-01-03 DIAGNOSIS — I639 Cerebral infarction, unspecified: Secondary | ICD-10-CM | POA: Diagnosis not present

## 2019-01-03 DIAGNOSIS — R278 Other lack of coordination: Secondary | ICD-10-CM | POA: Diagnosis not present

## 2019-01-05 DIAGNOSIS — R278 Other lack of coordination: Secondary | ICD-10-CM | POA: Diagnosis not present

## 2019-01-05 DIAGNOSIS — R509 Fever, unspecified: Secondary | ICD-10-CM | POA: Diagnosis not present

## 2019-01-05 DIAGNOSIS — I639 Cerebral infarction, unspecified: Secondary | ICD-10-CM | POA: Diagnosis not present

## 2019-01-06 DIAGNOSIS — I639 Cerebral infarction, unspecified: Secondary | ICD-10-CM | POA: Diagnosis not present

## 2019-01-06 DIAGNOSIS — R278 Other lack of coordination: Secondary | ICD-10-CM | POA: Diagnosis not present

## 2019-01-08 DIAGNOSIS — R278 Other lack of coordination: Secondary | ICD-10-CM | POA: Diagnosis not present

## 2019-01-08 DIAGNOSIS — I639 Cerebral infarction, unspecified: Secondary | ICD-10-CM | POA: Diagnosis not present

## 2019-01-09 DIAGNOSIS — I639 Cerebral infarction, unspecified: Secondary | ICD-10-CM | POA: Diagnosis not present

## 2019-01-09 DIAGNOSIS — R278 Other lack of coordination: Secondary | ICD-10-CM | POA: Diagnosis not present

## 2019-01-10 DIAGNOSIS — R278 Other lack of coordination: Secondary | ICD-10-CM | POA: Diagnosis not present

## 2019-01-10 DIAGNOSIS — I639 Cerebral infarction, unspecified: Secondary | ICD-10-CM | POA: Diagnosis not present

## 2019-01-11 DIAGNOSIS — R509 Fever, unspecified: Secondary | ICD-10-CM | POA: Diagnosis not present

## 2019-01-11 DIAGNOSIS — Z79899 Other long term (current) drug therapy: Secondary | ICD-10-CM | POA: Diagnosis not present

## 2019-01-11 DIAGNOSIS — R278 Other lack of coordination: Secondary | ICD-10-CM | POA: Diagnosis not present

## 2019-01-11 DIAGNOSIS — D649 Anemia, unspecified: Secondary | ICD-10-CM | POA: Diagnosis not present

## 2019-01-11 DIAGNOSIS — I639 Cerebral infarction, unspecified: Secondary | ICD-10-CM | POA: Diagnosis not present

## 2019-01-11 DIAGNOSIS — R634 Abnormal weight loss: Secondary | ICD-10-CM | POA: Diagnosis not present

## 2019-01-12 DIAGNOSIS — R278 Other lack of coordination: Secondary | ICD-10-CM | POA: Diagnosis not present

## 2019-01-12 DIAGNOSIS — I639 Cerebral infarction, unspecified: Secondary | ICD-10-CM | POA: Diagnosis not present

## 2019-01-12 DIAGNOSIS — R509 Fever, unspecified: Secondary | ICD-10-CM | POA: Diagnosis not present

## 2019-01-13 DIAGNOSIS — R509 Fever, unspecified: Secondary | ICD-10-CM | POA: Diagnosis not present

## 2019-01-13 DIAGNOSIS — S91301A Unspecified open wound, right foot, initial encounter: Secondary | ICD-10-CM | POA: Diagnosis not present

## 2019-01-13 DIAGNOSIS — R278 Other lack of coordination: Secondary | ICD-10-CM | POA: Diagnosis not present

## 2019-01-13 DIAGNOSIS — I639 Cerebral infarction, unspecified: Secondary | ICD-10-CM | POA: Diagnosis not present

## 2019-01-14 DIAGNOSIS — E559 Vitamin D deficiency, unspecified: Secondary | ICD-10-CM | POA: Diagnosis not present

## 2019-01-14 DIAGNOSIS — E119 Type 2 diabetes mellitus without complications: Secondary | ICD-10-CM | POA: Diagnosis not present

## 2019-01-14 DIAGNOSIS — N189 Chronic kidney disease, unspecified: Secondary | ICD-10-CM | POA: Diagnosis not present

## 2019-01-14 DIAGNOSIS — D649 Anemia, unspecified: Secondary | ICD-10-CM | POA: Diagnosis not present

## 2019-01-14 DIAGNOSIS — E039 Hypothyroidism, unspecified: Secondary | ICD-10-CM | POA: Diagnosis not present

## 2019-01-14 DIAGNOSIS — Z79899 Other long term (current) drug therapy: Secondary | ICD-10-CM | POA: Diagnosis not present

## 2019-01-15 DIAGNOSIS — I639 Cerebral infarction, unspecified: Secondary | ICD-10-CM | POA: Diagnosis not present

## 2019-01-15 DIAGNOSIS — R278 Other lack of coordination: Secondary | ICD-10-CM | POA: Diagnosis not present

## 2019-01-16 DIAGNOSIS — R278 Other lack of coordination: Secondary | ICD-10-CM | POA: Diagnosis not present

## 2019-01-16 DIAGNOSIS — I639 Cerebral infarction, unspecified: Secondary | ICD-10-CM | POA: Diagnosis not present

## 2019-01-17 DIAGNOSIS — D649 Anemia, unspecified: Secondary | ICD-10-CM | POA: Diagnosis not present

## 2019-01-17 DIAGNOSIS — R278 Other lack of coordination: Secondary | ICD-10-CM | POA: Diagnosis not present

## 2019-01-17 DIAGNOSIS — Z79899 Other long term (current) drug therapy: Secondary | ICD-10-CM | POA: Diagnosis not present

## 2019-01-17 DIAGNOSIS — N189 Chronic kidney disease, unspecified: Secondary | ICD-10-CM | POA: Diagnosis not present

## 2019-01-17 DIAGNOSIS — I639 Cerebral infarction, unspecified: Secondary | ICD-10-CM | POA: Diagnosis not present

## 2019-01-18 DIAGNOSIS — F331 Major depressive disorder, recurrent, moderate: Secondary | ICD-10-CM | POA: Diagnosis not present

## 2019-01-18 DIAGNOSIS — I639 Cerebral infarction, unspecified: Secondary | ICD-10-CM | POA: Diagnosis not present

## 2019-01-18 DIAGNOSIS — R278 Other lack of coordination: Secondary | ICD-10-CM | POA: Diagnosis not present

## 2019-01-18 DIAGNOSIS — F0151 Vascular dementia with behavioral disturbance: Secondary | ICD-10-CM | POA: Diagnosis not present

## 2019-01-18 DIAGNOSIS — F062 Psychotic disorder with delusions due to known physiological condition: Secondary | ICD-10-CM | POA: Diagnosis not present

## 2019-01-19 DIAGNOSIS — R278 Other lack of coordination: Secondary | ICD-10-CM | POA: Diagnosis not present

## 2019-01-19 DIAGNOSIS — I639 Cerebral infarction, unspecified: Secondary | ICD-10-CM | POA: Diagnosis not present

## 2019-01-20 DIAGNOSIS — I639 Cerebral infarction, unspecified: Secondary | ICD-10-CM | POA: Diagnosis not present

## 2019-01-20 DIAGNOSIS — F0390 Unspecified dementia without behavioral disturbance: Secondary | ICD-10-CM | POA: Diagnosis not present

## 2019-01-20 DIAGNOSIS — R278 Other lack of coordination: Secondary | ICD-10-CM | POA: Diagnosis not present

## 2019-01-21 DIAGNOSIS — I639 Cerebral infarction, unspecified: Secondary | ICD-10-CM | POA: Diagnosis not present

## 2019-01-21 DIAGNOSIS — R278 Other lack of coordination: Secondary | ICD-10-CM | POA: Diagnosis not present

## 2019-01-22 DIAGNOSIS — R278 Other lack of coordination: Secondary | ICD-10-CM | POA: Diagnosis not present

## 2019-01-22 DIAGNOSIS — I639 Cerebral infarction, unspecified: Secondary | ICD-10-CM | POA: Diagnosis not present

## 2019-01-23 DIAGNOSIS — R278 Other lack of coordination: Secondary | ICD-10-CM | POA: Diagnosis not present

## 2019-01-23 DIAGNOSIS — I639 Cerebral infarction, unspecified: Secondary | ICD-10-CM | POA: Diagnosis not present

## 2019-01-23 DIAGNOSIS — D649 Anemia, unspecified: Secondary | ICD-10-CM | POA: Diagnosis not present

## 2019-01-25 DIAGNOSIS — R278 Other lack of coordination: Secondary | ICD-10-CM | POA: Diagnosis not present

## 2019-01-25 DIAGNOSIS — I639 Cerebral infarction, unspecified: Secondary | ICD-10-CM | POA: Diagnosis not present

## 2019-01-27 DIAGNOSIS — I639 Cerebral infarction, unspecified: Secondary | ICD-10-CM | POA: Diagnosis not present

## 2019-01-27 DIAGNOSIS — R278 Other lack of coordination: Secondary | ICD-10-CM | POA: Diagnosis not present

## 2019-01-29 DIAGNOSIS — R278 Other lack of coordination: Secondary | ICD-10-CM | POA: Diagnosis not present

## 2019-01-29 DIAGNOSIS — I639 Cerebral infarction, unspecified: Secondary | ICD-10-CM | POA: Diagnosis not present

## 2019-02-01 DIAGNOSIS — F0151 Vascular dementia with behavioral disturbance: Secondary | ICD-10-CM | POA: Diagnosis not present

## 2019-02-01 DIAGNOSIS — F062 Psychotic disorder with delusions due to known physiological condition: Secondary | ICD-10-CM | POA: Diagnosis not present

## 2019-02-01 DIAGNOSIS — R278 Other lack of coordination: Secondary | ICD-10-CM | POA: Diagnosis not present

## 2019-02-01 DIAGNOSIS — I639 Cerebral infarction, unspecified: Secondary | ICD-10-CM | POA: Diagnosis not present

## 2019-02-04 DIAGNOSIS — I639 Cerebral infarction, unspecified: Secondary | ICD-10-CM | POA: Diagnosis not present

## 2019-02-04 DIAGNOSIS — R278 Other lack of coordination: Secondary | ICD-10-CM | POA: Diagnosis not present

## 2019-02-05 DIAGNOSIS — I639 Cerebral infarction, unspecified: Secondary | ICD-10-CM | POA: Diagnosis not present

## 2019-02-05 DIAGNOSIS — R278 Other lack of coordination: Secondary | ICD-10-CM | POA: Diagnosis not present

## 2019-02-07 DIAGNOSIS — R278 Other lack of coordination: Secondary | ICD-10-CM | POA: Diagnosis not present

## 2019-02-07 DIAGNOSIS — I639 Cerebral infarction, unspecified: Secondary | ICD-10-CM | POA: Diagnosis not present

## 2019-02-09 DIAGNOSIS — I639 Cerebral infarction, unspecified: Secondary | ICD-10-CM | POA: Diagnosis not present

## 2019-02-09 DIAGNOSIS — R278 Other lack of coordination: Secondary | ICD-10-CM | POA: Diagnosis not present

## 2019-02-11 IMAGING — CT CT HEAD W/O CM
4 series · 16 of 47 positions shown, 18 images · non-contrast
Comparison: Head CT and MRI 04/24/2015

CLINICAL DATA: Right-sided weakness beginning yesterday. History of
stroke.

EXAM:
CT HEAD WITHOUT CONTRAST
TECHNIQUE: Contiguous axial images were obtained from the base of the skull
through the vertex without intravenous contrast.

[Series 2: head wo · axial · 0.44mm/px · z∈[-62,+58]mm · 7 of 34 slices shown, 9 images]
[im 5/34  brain]
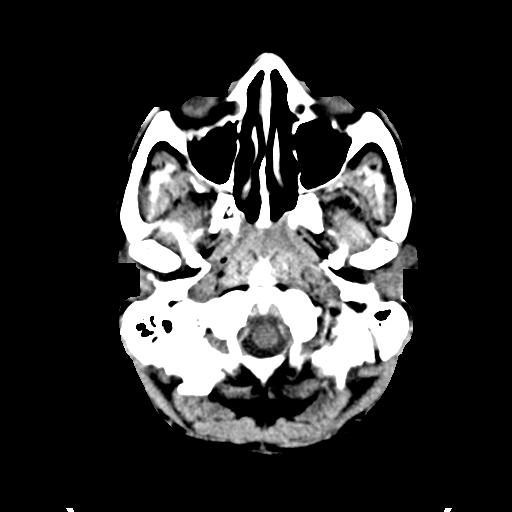
[im 5/34  bone]
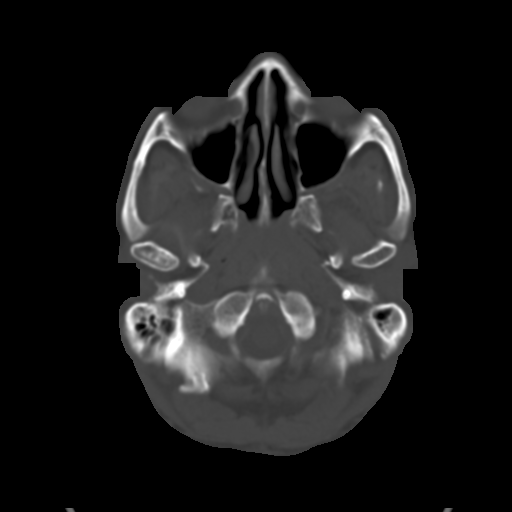
[im 9/34  brain]
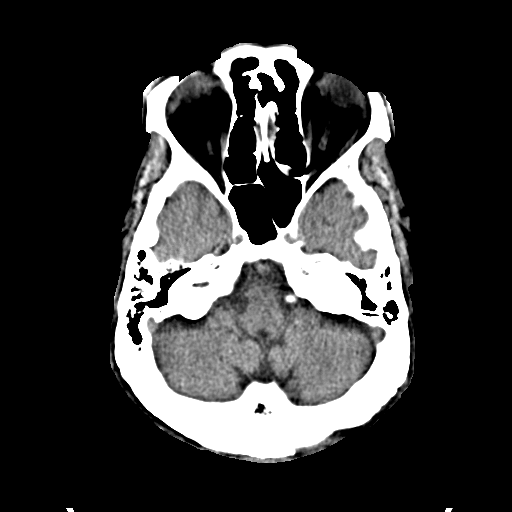
[im 13/34  brain]
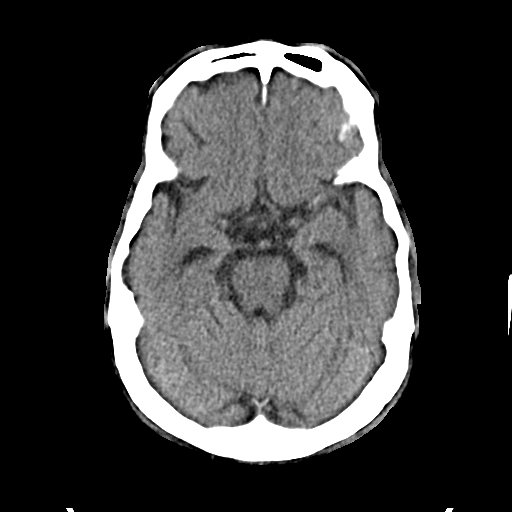
[im 17/34  brain]
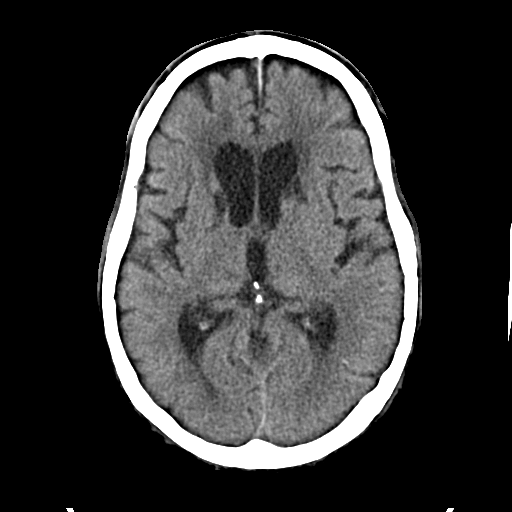
[im 21/34  brain]
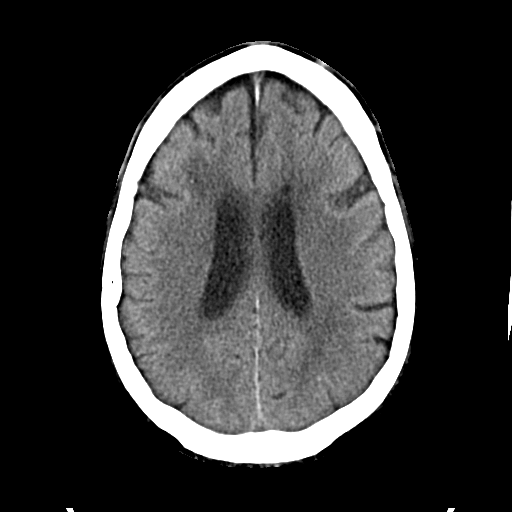
[im 21/34  bone]
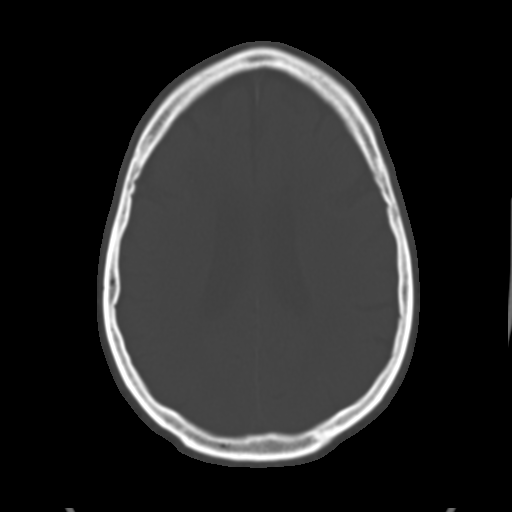
[im 25/34  brain]
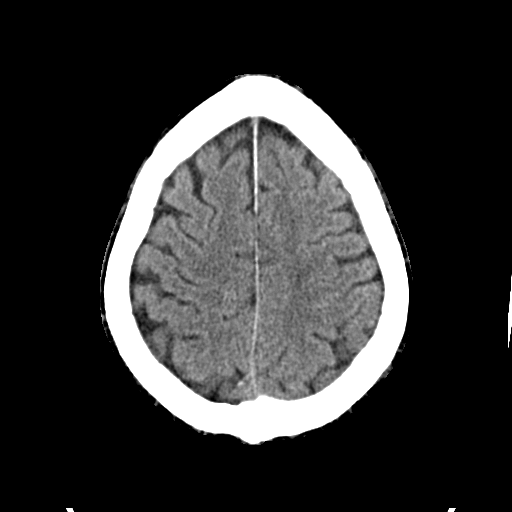
[im 29/34  brain]
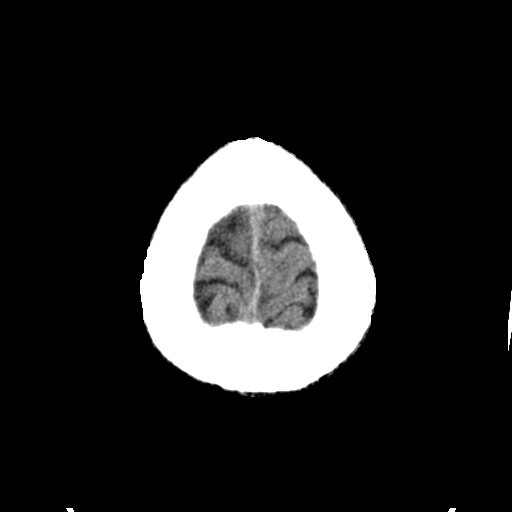

[Series 3: head bone · axial · 0.44mm/px · z∈[-66,-32]mm · 3 of 85 slices shown]
[im 9/85  bone]
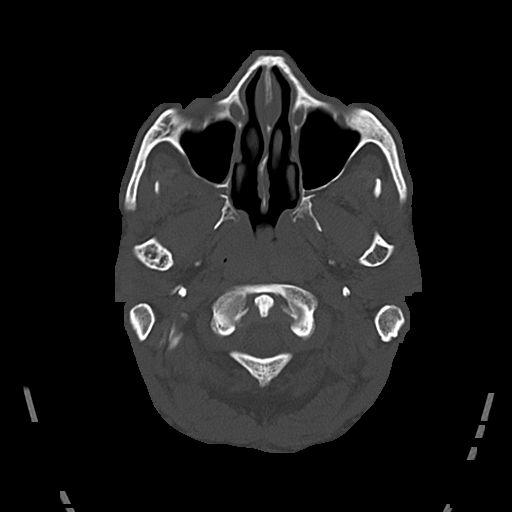
[im 17/85  bone]
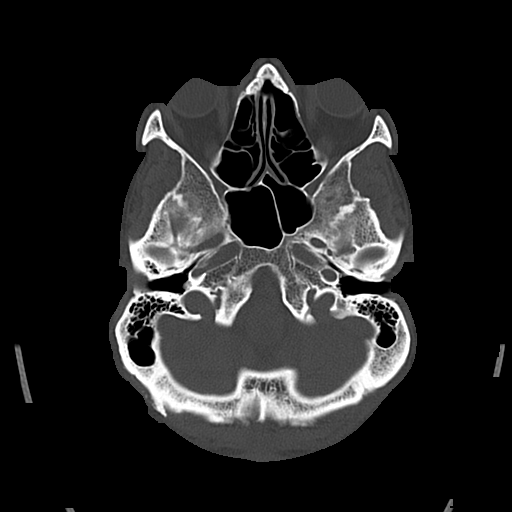
[im 26/85  bone]
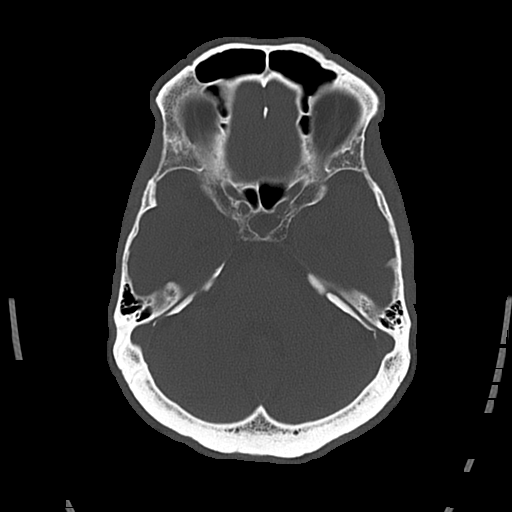

[Series 4: cor soft · coronal · 0.31mm/px · 3 of 69 slices shown]
[im 23/69  brain]
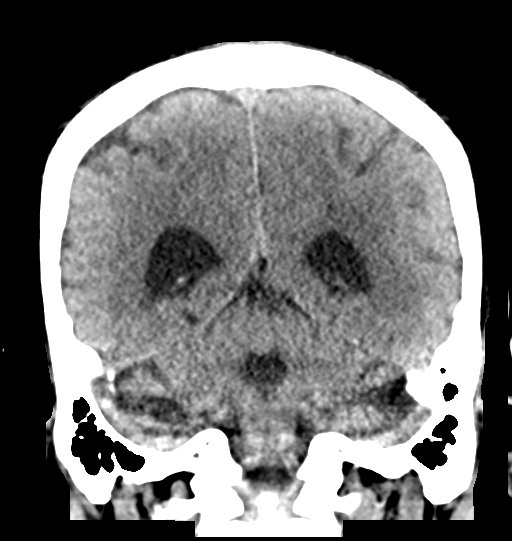
[im 31/69  brain]
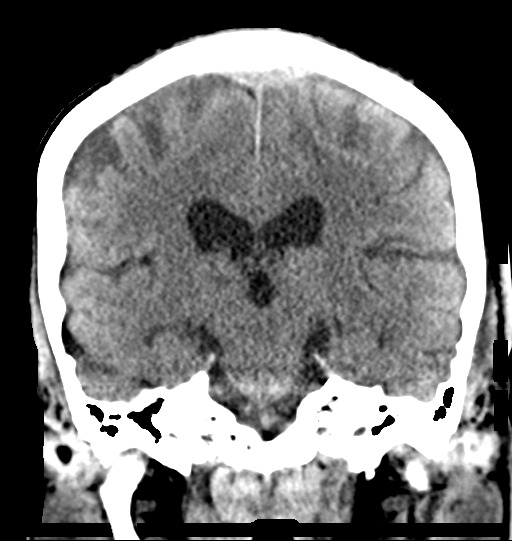
[im 38/69  brain]
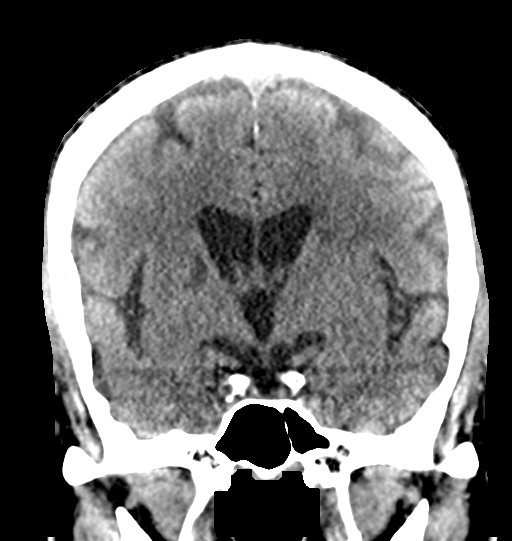

[Series 5: sag soft · sagittal · 0.33mm/px · 3 of 57 slices shown]
[im 19/57  brain]
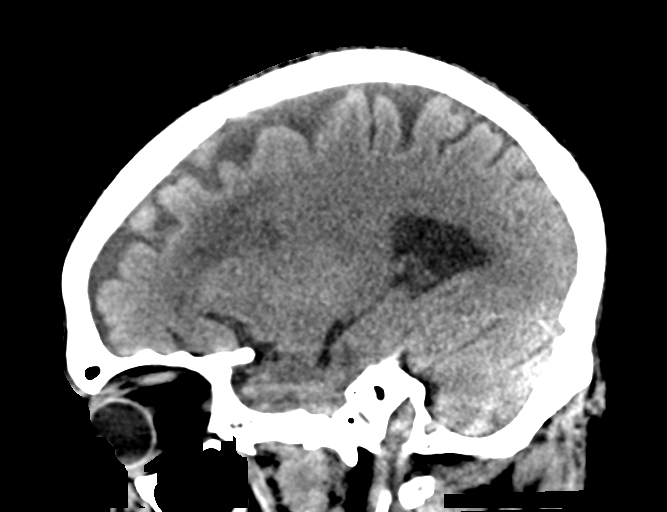
[im 29/57  brain]
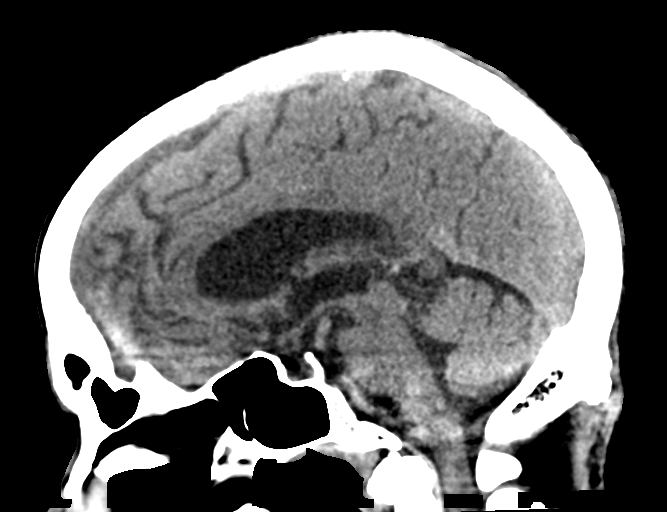
[im 38/57  brain]
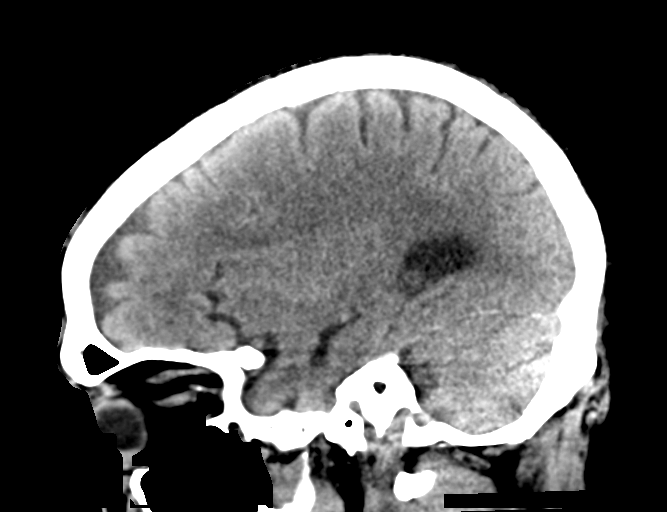

[16 of 47 positions shown; findings below may reference images not displayed]

FINDINGS: Brain: There is an interval infarct anteriorly in the left basal
ganglia which is chronic. Additional new lacunar infarcts in the
left corona radiata and superior left lentiform nucleus region are
less well defined and may be acute or subacute (series 2, images 18
and 20). Additional chronic infarcts are again seen involving the
right basal ganglia, thalami, and deep cerebral white matter
bilaterally. There is mild ex vacuo enlargement of the frontal horns
of both lateral ventricles. There is no evidence of acute large
territory infarct, intracranial hemorrhage, mass, midline shift, or
extra-axial fluid collection. There is mild cerebral atrophy. Mild
periventricular white matter hypodensities bilaterally are
compatible with chronic small vessel ischemic disease.

Vascular: Calcified atherosclerosis at the skull base. No hyperdense
vessel.

Skull: No fracture focal osseous lesion.

Sinuses/Orbits: Mild posterior left ethmoid air cell opacification.
Clear mastoid air cells. Unremarkable orbits.

Other: None.
IMPRESSION: 1. No evidence of acute large territory infarct or intracranial
hemorrhage.
2. New lacunar infarcts in the left corona radiata/superior basal
ganglia, possibly acute or subacute.
3. Chronic bilateral basal ganglia region infarcts bilaterally,
increased from 4401.

## 2019-02-13 ENCOUNTER — Encounter (HOSPITAL_COMMUNITY)
Admission: RE | Admit: 2019-02-13 | Discharge: 2019-02-13 | Disposition: A | Discharge status: Home / Self Care | Payer: Medicare Other | Source: Ambulatory Visit | Attending: Internal Medicine | Admitting: Internal Medicine

## 2019-02-13 ENCOUNTER — Encounter (HOSPITAL_COMMUNITY): Payer: Self-pay

## 2019-02-13 ENCOUNTER — Other Ambulatory Visit: Payer: Self-pay

## 2019-02-13 DIAGNOSIS — D649 Anemia, unspecified: Secondary | ICD-10-CM | POA: Diagnosis not present

## 2019-02-13 DIAGNOSIS — I639 Cerebral infarction, unspecified: Secondary | ICD-10-CM | POA: Diagnosis not present

## 2019-02-13 DIAGNOSIS — R278 Other lack of coordination: Secondary | ICD-10-CM | POA: Diagnosis not present

## 2019-02-13 DIAGNOSIS — N183 Chronic kidney disease, stage 3 unspecified: Secondary | ICD-10-CM | POA: Diagnosis not present

## 2019-02-13 IMAGING — DX DG CHEST 1V PORT
1 series · 1 of 1 positions shown · non-contrast
Comparison: 05/24/2017

CLINICAL DATA: Respiratory failure.

EXAM:
PORTABLE CHEST 1 VIEW

[chest ap]
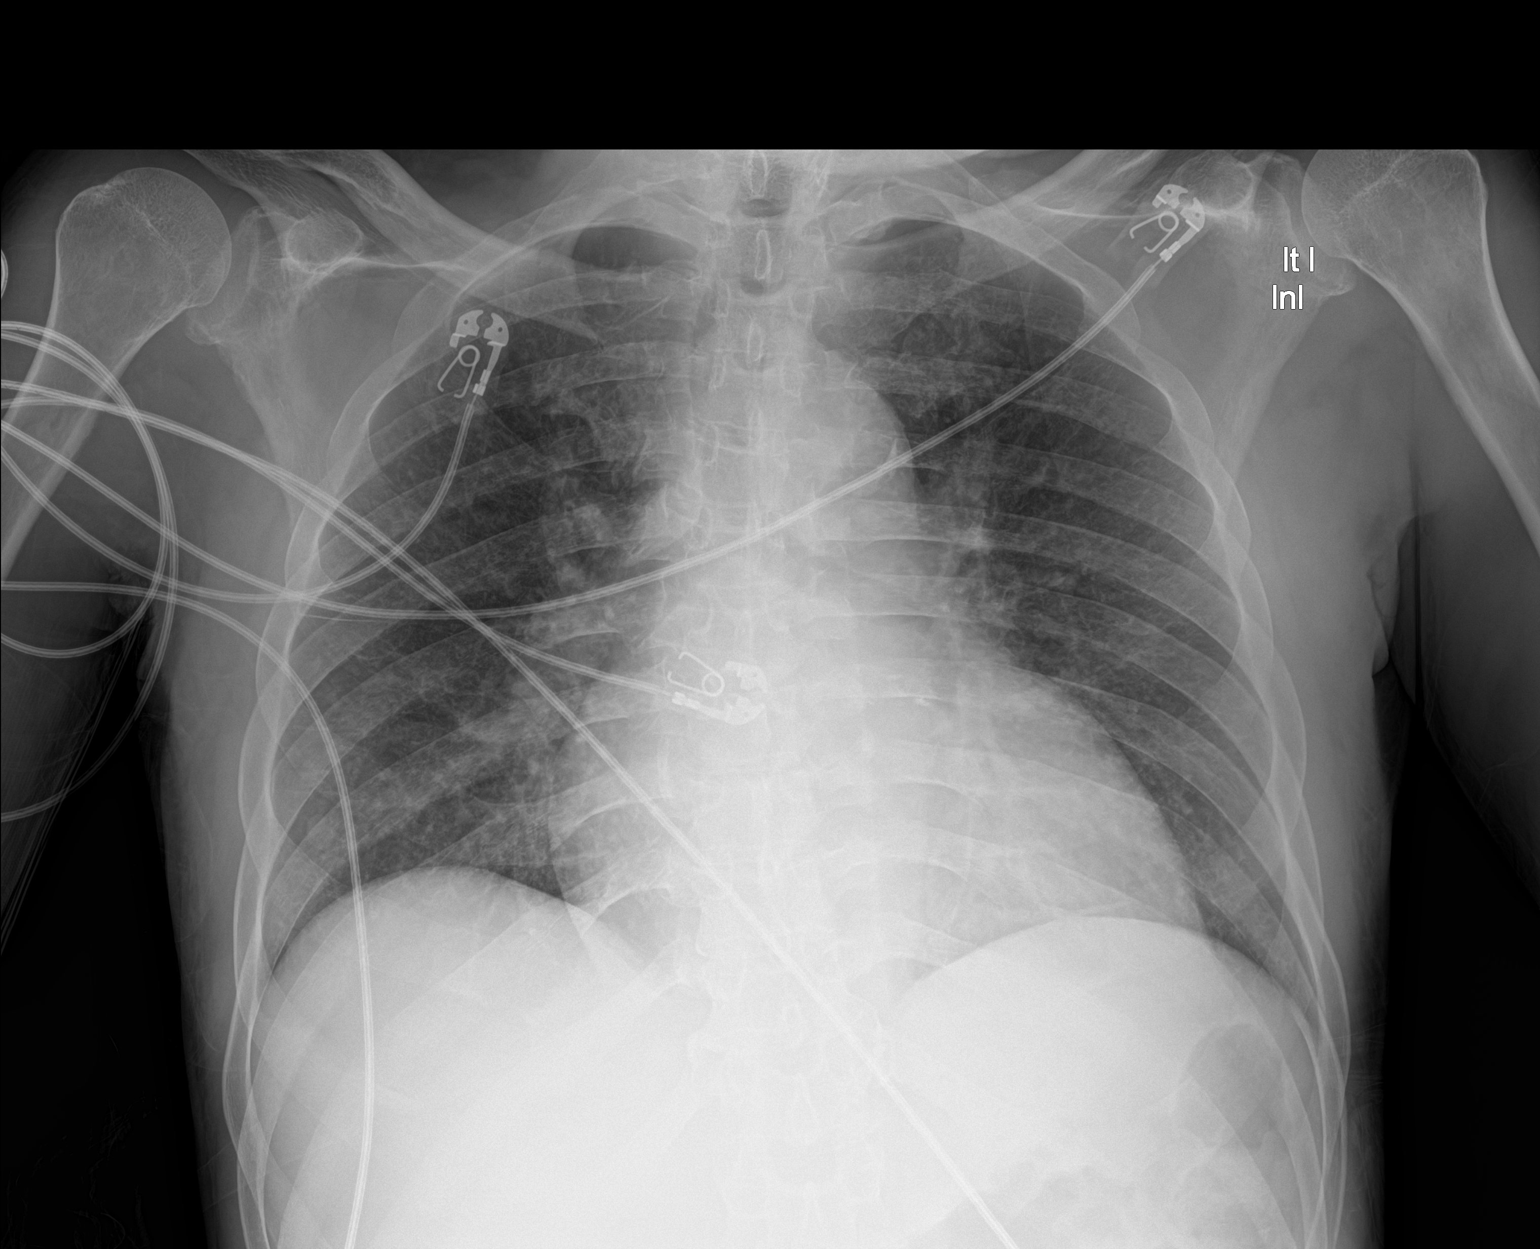

[1 of 1 positions shown; findings below may reference images not displayed]

FINDINGS: The cardiac silhouette remains enlarged. Aortic atherosclerosis is
noted. Interstitial coarsening is similar to the prior study. No
lobar consolidation, overt edema, sizable pleural effusion, or
pneumothorax is identified.
IMPRESSION: Cardiomegaly and chronic bronchitic changes without overt edema.

## 2019-02-13 MED ORDER — SODIUM CHLORIDE 0.9 % IV SOLN
510.0000 mg | Freq: Once | INTRAVENOUS | Status: AC
  Administered 2019-02-13: 12:00:00 510 mg via INTRAVENOUS
  Filled 2019-02-13: qty 510

## 2019-02-13 MED ORDER — SODIUM CHLORIDE 0.9 % IV SOLN
Freq: Once | INTRAVENOUS | Status: AC
  Administered 2019-02-13: 12:00:00 via INTRAVENOUS

## 2019-02-15 DIAGNOSIS — R278 Other lack of coordination: Secondary | ICD-10-CM | POA: Diagnosis not present

## 2019-02-15 DIAGNOSIS — F331 Major depressive disorder, recurrent, moderate: Secondary | ICD-10-CM | POA: Diagnosis not present

## 2019-02-15 DIAGNOSIS — F0151 Vascular dementia with behavioral disturbance: Secondary | ICD-10-CM | POA: Diagnosis not present

## 2019-02-15 DIAGNOSIS — I639 Cerebral infarction, unspecified: Secondary | ICD-10-CM | POA: Diagnosis not present

## 2019-02-15 DIAGNOSIS — F062 Psychotic disorder with delusions due to known physiological condition: Secondary | ICD-10-CM | POA: Diagnosis not present

## 2019-02-16 DIAGNOSIS — D649 Anemia, unspecified: Secondary | ICD-10-CM | POA: Diagnosis not present

## 2019-02-16 DIAGNOSIS — I639 Cerebral infarction, unspecified: Secondary | ICD-10-CM | POA: Diagnosis not present

## 2019-02-16 DIAGNOSIS — R278 Other lack of coordination: Secondary | ICD-10-CM | POA: Diagnosis not present

## 2019-02-20 ENCOUNTER — Other Ambulatory Visit: Payer: Self-pay

## 2019-02-20 ENCOUNTER — Encounter (HOSPITAL_COMMUNITY)
Admission: RE | Admit: 2019-02-20 | Discharge: 2019-02-20 | Disposition: A | Discharge status: Home / Self Care | Payer: Medicare Other | Source: Ambulatory Visit | Attending: Internal Medicine | Admitting: Internal Medicine

## 2019-02-20 ENCOUNTER — Encounter (HOSPITAL_COMMUNITY): Payer: Self-pay

## 2019-02-20 DIAGNOSIS — D649 Anemia, unspecified: Secondary | ICD-10-CM | POA: Diagnosis not present

## 2019-02-20 DIAGNOSIS — N183 Chronic kidney disease, stage 3 unspecified: Secondary | ICD-10-CM | POA: Diagnosis not present

## 2019-02-20 MED ORDER — SODIUM CHLORIDE 0.9 % IV SOLN
Freq: Once | INTRAVENOUS | Status: AC
  Administered 2019-02-20: 14:00:00 via INTRAVENOUS

## 2019-02-20 MED ORDER — SODIUM CHLORIDE 0.9 % IV SOLN
510.0000 mg | Freq: Once | INTRAVENOUS | Status: AC
  Administered 2019-02-20: 510 mg via INTRAVENOUS
  Filled 2019-02-20: qty 510

## 2019-03-01 DIAGNOSIS — I639 Cerebral infarction, unspecified: Secondary | ICD-10-CM | POA: Diagnosis not present

## 2019-03-01 DIAGNOSIS — I739 Peripheral vascular disease, unspecified: Secondary | ICD-10-CM | POA: Diagnosis not present

## 2019-03-01 DIAGNOSIS — I10 Essential (primary) hypertension: Secondary | ICD-10-CM | POA: Diagnosis not present

## 2019-03-01 DIAGNOSIS — U071 COVID-19: Secondary | ICD-10-CM | POA: Diagnosis not present

## 2019-03-01 DIAGNOSIS — I5032 Chronic diastolic (congestive) heart failure: Secondary | ICD-10-CM | POA: Diagnosis not present

## 2019-03-06 DIAGNOSIS — Z20828 Contact with and (suspected) exposure to other viral communicable diseases: Secondary | ICD-10-CM | POA: Diagnosis not present

## 2019-03-14 DIAGNOSIS — Z03818 Encounter for observation for suspected exposure to other biological agents ruled out: Secondary | ICD-10-CM | POA: Diagnosis not present

## 2019-03-14 DIAGNOSIS — Z20828 Contact with and (suspected) exposure to other viral communicable diseases: Secondary | ICD-10-CM | POA: Diagnosis not present

## 2019-03-15 DIAGNOSIS — F331 Major depressive disorder, recurrent, moderate: Secondary | ICD-10-CM | POA: Diagnosis not present

## 2019-03-15 DIAGNOSIS — F062 Psychotic disorder with delusions due to known physiological condition: Secondary | ICD-10-CM | POA: Diagnosis not present

## 2019-03-15 DIAGNOSIS — F0151 Vascular dementia with behavioral disturbance: Secondary | ICD-10-CM | POA: Diagnosis not present

## 2019-03-23 DIAGNOSIS — D649 Anemia, unspecified: Secondary | ICD-10-CM | POA: Diagnosis not present

## 2019-03-23 DIAGNOSIS — E559 Vitamin D deficiency, unspecified: Secondary | ICD-10-CM | POA: Diagnosis not present

## 2019-04-10 DIAGNOSIS — Z20828 Contact with and (suspected) exposure to other viral communicable diseases: Secondary | ICD-10-CM | POA: Diagnosis not present

## 2019-04-10 DIAGNOSIS — I639 Cerebral infarction, unspecified: Secondary | ICD-10-CM | POA: Diagnosis not present

## 2019-04-10 DIAGNOSIS — Z03818 Encounter for observation for suspected exposure to other biological agents ruled out: Secondary | ICD-10-CM | POA: Diagnosis not present

## 2019-04-11 DIAGNOSIS — F0281 Dementia in other diseases classified elsewhere with behavioral disturbance: Secondary | ICD-10-CM | POA: Diagnosis not present

## 2019-04-11 DIAGNOSIS — R293 Abnormal posture: Secondary | ICD-10-CM | POA: Diagnosis not present

## 2019-04-11 DIAGNOSIS — I69351 Hemiplegia and hemiparesis following cerebral infarction affecting right dominant side: Secondary | ICD-10-CM | POA: Diagnosis not present

## 2019-04-11 DIAGNOSIS — I639 Cerebral infarction, unspecified: Secondary | ICD-10-CM | POA: Diagnosis not present

## 2019-04-12 DIAGNOSIS — F0281 Dementia in other diseases classified elsewhere with behavioral disturbance: Secondary | ICD-10-CM | POA: Diagnosis not present

## 2019-04-12 DIAGNOSIS — I639 Cerebral infarction, unspecified: Secondary | ICD-10-CM | POA: Diagnosis not present

## 2019-04-12 DIAGNOSIS — I69351 Hemiplegia and hemiparesis following cerebral infarction affecting right dominant side: Secondary | ICD-10-CM | POA: Diagnosis not present

## 2019-04-12 DIAGNOSIS — R293 Abnormal posture: Secondary | ICD-10-CM | POA: Diagnosis not present

## 2019-04-14 DIAGNOSIS — R293 Abnormal posture: Secondary | ICD-10-CM | POA: Diagnosis not present

## 2019-04-14 DIAGNOSIS — F0281 Dementia in other diseases classified elsewhere with behavioral disturbance: Secondary | ICD-10-CM | POA: Diagnosis not present

## 2019-04-14 DIAGNOSIS — I639 Cerebral infarction, unspecified: Secondary | ICD-10-CM | POA: Diagnosis not present

## 2019-04-14 DIAGNOSIS — I69351 Hemiplegia and hemiparesis following cerebral infarction affecting right dominant side: Secondary | ICD-10-CM | POA: Diagnosis not present

## 2019-04-15 DIAGNOSIS — I639 Cerebral infarction, unspecified: Secondary | ICD-10-CM | POA: Diagnosis not present

## 2019-04-15 DIAGNOSIS — R293 Abnormal posture: Secondary | ICD-10-CM | POA: Diagnosis not present

## 2019-04-15 DIAGNOSIS — I69351 Hemiplegia and hemiparesis following cerebral infarction affecting right dominant side: Secondary | ICD-10-CM | POA: Diagnosis not present

## 2019-04-15 DIAGNOSIS — F0281 Dementia in other diseases classified elsewhere with behavioral disturbance: Secondary | ICD-10-CM | POA: Diagnosis not present

## 2019-04-16 DIAGNOSIS — R293 Abnormal posture: Secondary | ICD-10-CM | POA: Diagnosis not present

## 2019-04-16 DIAGNOSIS — I639 Cerebral infarction, unspecified: Secondary | ICD-10-CM | POA: Diagnosis not present

## 2019-04-16 DIAGNOSIS — I69351 Hemiplegia and hemiparesis following cerebral infarction affecting right dominant side: Secondary | ICD-10-CM | POA: Diagnosis not present

## 2019-04-16 DIAGNOSIS — F0281 Dementia in other diseases classified elsewhere with behavioral disturbance: Secondary | ICD-10-CM | POA: Diagnosis not present

## 2019-04-17 DIAGNOSIS — I639 Cerebral infarction, unspecified: Secondary | ICD-10-CM | POA: Diagnosis not present

## 2019-04-17 DIAGNOSIS — I69351 Hemiplegia and hemiparesis following cerebral infarction affecting right dominant side: Secondary | ICD-10-CM | POA: Diagnosis not present

## 2019-04-17 DIAGNOSIS — F0281 Dementia in other diseases classified elsewhere with behavioral disturbance: Secondary | ICD-10-CM | POA: Diagnosis not present

## 2019-04-17 DIAGNOSIS — Z20828 Contact with and (suspected) exposure to other viral communicable diseases: Secondary | ICD-10-CM | POA: Diagnosis not present

## 2019-04-17 DIAGNOSIS — R293 Abnormal posture: Secondary | ICD-10-CM | POA: Diagnosis not present

## 2019-04-17 DIAGNOSIS — Z03818 Encounter for observation for suspected exposure to other biological agents ruled out: Secondary | ICD-10-CM | POA: Diagnosis not present

## 2019-04-19 DIAGNOSIS — F0281 Dementia in other diseases classified elsewhere with behavioral disturbance: Secondary | ICD-10-CM | POA: Diagnosis not present

## 2019-04-19 DIAGNOSIS — F062 Psychotic disorder with delusions due to known physiological condition: Secondary | ICD-10-CM | POA: Diagnosis not present

## 2019-04-19 DIAGNOSIS — R293 Abnormal posture: Secondary | ICD-10-CM | POA: Diagnosis not present

## 2019-04-19 DIAGNOSIS — F331 Major depressive disorder, recurrent, moderate: Secondary | ICD-10-CM | POA: Diagnosis not present

## 2019-04-19 DIAGNOSIS — I639 Cerebral infarction, unspecified: Secondary | ICD-10-CM | POA: Diagnosis not present

## 2019-04-19 DIAGNOSIS — I69351 Hemiplegia and hemiparesis following cerebral infarction affecting right dominant side: Secondary | ICD-10-CM | POA: Diagnosis not present

## 2019-04-19 DIAGNOSIS — F0151 Vascular dementia with behavioral disturbance: Secondary | ICD-10-CM | POA: Diagnosis not present

## 2019-04-21 DIAGNOSIS — R293 Abnormal posture: Secondary | ICD-10-CM | POA: Diagnosis not present

## 2019-04-21 DIAGNOSIS — D649 Anemia, unspecified: Secondary | ICD-10-CM | POA: Diagnosis not present

## 2019-04-21 DIAGNOSIS — F0281 Dementia in other diseases classified elsewhere with behavioral disturbance: Secondary | ICD-10-CM | POA: Diagnosis not present

## 2019-04-21 DIAGNOSIS — I639 Cerebral infarction, unspecified: Secondary | ICD-10-CM | POA: Diagnosis not present

## 2019-04-21 DIAGNOSIS — I69351 Hemiplegia and hemiparesis following cerebral infarction affecting right dominant side: Secondary | ICD-10-CM | POA: Diagnosis not present

## 2019-04-22 DIAGNOSIS — R293 Abnormal posture: Secondary | ICD-10-CM | POA: Diagnosis not present

## 2019-04-22 DIAGNOSIS — I639 Cerebral infarction, unspecified: Secondary | ICD-10-CM | POA: Diagnosis not present

## 2019-04-22 DIAGNOSIS — F0281 Dementia in other diseases classified elsewhere with behavioral disturbance: Secondary | ICD-10-CM | POA: Diagnosis not present

## 2019-04-22 DIAGNOSIS — I69351 Hemiplegia and hemiparesis following cerebral infarction affecting right dominant side: Secondary | ICD-10-CM | POA: Diagnosis not present

## 2019-04-24 DIAGNOSIS — I69351 Hemiplegia and hemiparesis following cerebral infarction affecting right dominant side: Secondary | ICD-10-CM | POA: Diagnosis not present

## 2019-04-24 DIAGNOSIS — I639 Cerebral infarction, unspecified: Secondary | ICD-10-CM | POA: Diagnosis not present

## 2019-04-24 DIAGNOSIS — R293 Abnormal posture: Secondary | ICD-10-CM | POA: Diagnosis not present

## 2019-04-24 DIAGNOSIS — F0281 Dementia in other diseases classified elsewhere with behavioral disturbance: Secondary | ICD-10-CM | POA: Diagnosis not present

## 2019-04-25 DIAGNOSIS — I69351 Hemiplegia and hemiparesis following cerebral infarction affecting right dominant side: Secondary | ICD-10-CM | POA: Diagnosis not present

## 2019-04-25 DIAGNOSIS — F0281 Dementia in other diseases classified elsewhere with behavioral disturbance: Secondary | ICD-10-CM | POA: Diagnosis not present

## 2019-04-25 DIAGNOSIS — R293 Abnormal posture: Secondary | ICD-10-CM | POA: Diagnosis not present

## 2019-04-25 DIAGNOSIS — I639 Cerebral infarction, unspecified: Secondary | ICD-10-CM | POA: Diagnosis not present

## 2019-04-26 DIAGNOSIS — Z23 Encounter for immunization: Secondary | ICD-10-CM | POA: Diagnosis not present

## 2019-04-27 DIAGNOSIS — I639 Cerebral infarction, unspecified: Secondary | ICD-10-CM | POA: Diagnosis not present

## 2019-04-27 DIAGNOSIS — R293 Abnormal posture: Secondary | ICD-10-CM | POA: Diagnosis not present

## 2019-04-27 DIAGNOSIS — I69351 Hemiplegia and hemiparesis following cerebral infarction affecting right dominant side: Secondary | ICD-10-CM | POA: Diagnosis not present

## 2019-04-27 DIAGNOSIS — F0281 Dementia in other diseases classified elsewhere with behavioral disturbance: Secondary | ICD-10-CM | POA: Diagnosis not present

## 2019-04-28 DIAGNOSIS — I639 Cerebral infarction, unspecified: Secondary | ICD-10-CM | POA: Diagnosis not present

## 2019-04-28 DIAGNOSIS — F0281 Dementia in other diseases classified elsewhere with behavioral disturbance: Secondary | ICD-10-CM | POA: Diagnosis not present

## 2019-04-28 DIAGNOSIS — R293 Abnormal posture: Secondary | ICD-10-CM | POA: Diagnosis not present

## 2019-04-28 DIAGNOSIS — I69351 Hemiplegia and hemiparesis following cerebral infarction affecting right dominant side: Secondary | ICD-10-CM | POA: Diagnosis not present

## 2019-04-29 DIAGNOSIS — R293 Abnormal posture: Secondary | ICD-10-CM | POA: Diagnosis not present

## 2019-04-29 DIAGNOSIS — I639 Cerebral infarction, unspecified: Secondary | ICD-10-CM | POA: Diagnosis not present

## 2019-04-29 DIAGNOSIS — F0281 Dementia in other diseases classified elsewhere with behavioral disturbance: Secondary | ICD-10-CM | POA: Diagnosis not present

## 2019-04-29 DIAGNOSIS — I69351 Hemiplegia and hemiparesis following cerebral infarction affecting right dominant side: Secondary | ICD-10-CM | POA: Diagnosis not present

## 2019-04-30 DIAGNOSIS — I69351 Hemiplegia and hemiparesis following cerebral infarction affecting right dominant side: Secondary | ICD-10-CM | POA: Diagnosis not present

## 2019-04-30 DIAGNOSIS — I639 Cerebral infarction, unspecified: Secondary | ICD-10-CM | POA: Diagnosis not present

## 2019-04-30 DIAGNOSIS — R293 Abnormal posture: Secondary | ICD-10-CM | POA: Diagnosis not present

## 2019-04-30 DIAGNOSIS — F0281 Dementia in other diseases classified elsewhere with behavioral disturbance: Secondary | ICD-10-CM | POA: Diagnosis not present

## 2019-05-01 DIAGNOSIS — F0281 Dementia in other diseases classified elsewhere with behavioral disturbance: Secondary | ICD-10-CM | POA: Diagnosis not present

## 2019-05-01 DIAGNOSIS — R293 Abnormal posture: Secondary | ICD-10-CM | POA: Diagnosis not present

## 2019-05-01 DIAGNOSIS — I639 Cerebral infarction, unspecified: Secondary | ICD-10-CM | POA: Diagnosis not present

## 2019-05-01 DIAGNOSIS — I69351 Hemiplegia and hemiparesis following cerebral infarction affecting right dominant side: Secondary | ICD-10-CM | POA: Diagnosis not present

## 2019-05-01 DIAGNOSIS — Z20822 Contact with and (suspected) exposure to covid-19: Secondary | ICD-10-CM | POA: Diagnosis not present

## 2019-05-01 DIAGNOSIS — Z1152 Encounter for screening for COVID-19: Secondary | ICD-10-CM | POA: Diagnosis not present

## 2019-05-03 DIAGNOSIS — R293 Abnormal posture: Secondary | ICD-10-CM | POA: Diagnosis not present

## 2019-05-03 DIAGNOSIS — I69351 Hemiplegia and hemiparesis following cerebral infarction affecting right dominant side: Secondary | ICD-10-CM | POA: Diagnosis not present

## 2019-05-03 DIAGNOSIS — F0281 Dementia in other diseases classified elsewhere with behavioral disturbance: Secondary | ICD-10-CM | POA: Diagnosis not present

## 2019-05-03 DIAGNOSIS — I639 Cerebral infarction, unspecified: Secondary | ICD-10-CM | POA: Diagnosis not present

## 2019-05-05 DIAGNOSIS — I69351 Hemiplegia and hemiparesis following cerebral infarction affecting right dominant side: Secondary | ICD-10-CM | POA: Diagnosis not present

## 2019-05-05 DIAGNOSIS — R293 Abnormal posture: Secondary | ICD-10-CM | POA: Diagnosis not present

## 2019-05-05 DIAGNOSIS — F0281 Dementia in other diseases classified elsewhere with behavioral disturbance: Secondary | ICD-10-CM | POA: Diagnosis not present

## 2019-05-05 DIAGNOSIS — I639 Cerebral infarction, unspecified: Secondary | ICD-10-CM | POA: Diagnosis not present

## 2019-05-06 DIAGNOSIS — F0281 Dementia in other diseases classified elsewhere with behavioral disturbance: Secondary | ICD-10-CM | POA: Diagnosis not present

## 2019-05-06 DIAGNOSIS — I69351 Hemiplegia and hemiparesis following cerebral infarction affecting right dominant side: Secondary | ICD-10-CM | POA: Diagnosis not present

## 2019-05-06 DIAGNOSIS — I639 Cerebral infarction, unspecified: Secondary | ICD-10-CM | POA: Diagnosis not present

## 2019-05-06 DIAGNOSIS — R293 Abnormal posture: Secondary | ICD-10-CM | POA: Diagnosis not present

## 2019-05-07 DIAGNOSIS — I639 Cerebral infarction, unspecified: Secondary | ICD-10-CM | POA: Diagnosis not present

## 2019-05-07 DIAGNOSIS — F0281 Dementia in other diseases classified elsewhere with behavioral disturbance: Secondary | ICD-10-CM | POA: Diagnosis not present

## 2019-05-07 DIAGNOSIS — I69351 Hemiplegia and hemiparesis following cerebral infarction affecting right dominant side: Secondary | ICD-10-CM | POA: Diagnosis not present

## 2019-05-07 DIAGNOSIS — R293 Abnormal posture: Secondary | ICD-10-CM | POA: Diagnosis not present

## 2019-05-08 DIAGNOSIS — Z1152 Encounter for screening for COVID-19: Secondary | ICD-10-CM | POA: Diagnosis not present

## 2019-05-08 DIAGNOSIS — Z20822 Contact with and (suspected) exposure to covid-19: Secondary | ICD-10-CM | POA: Diagnosis not present

## 2019-05-08 DIAGNOSIS — R293 Abnormal posture: Secondary | ICD-10-CM | POA: Diagnosis not present

## 2019-05-08 DIAGNOSIS — I639 Cerebral infarction, unspecified: Secondary | ICD-10-CM | POA: Diagnosis not present

## 2019-05-08 DIAGNOSIS — I69351 Hemiplegia and hemiparesis following cerebral infarction affecting right dominant side: Secondary | ICD-10-CM | POA: Diagnosis not present

## 2019-05-08 DIAGNOSIS — F0281 Dementia in other diseases classified elsewhere with behavioral disturbance: Secondary | ICD-10-CM | POA: Diagnosis not present

## 2019-05-09 DIAGNOSIS — Z7952 Long term (current) use of systemic steroids: Secondary | ICD-10-CM | POA: Diagnosis not present

## 2019-05-09 DIAGNOSIS — H2513 Age-related nuclear cataract, bilateral: Secondary | ICD-10-CM | POA: Diagnosis not present

## 2019-05-15 DIAGNOSIS — Z20822 Contact with and (suspected) exposure to covid-19: Secondary | ICD-10-CM | POA: Diagnosis not present

## 2019-05-15 DIAGNOSIS — Z1152 Encounter for screening for COVID-19: Secondary | ICD-10-CM | POA: Diagnosis not present

## 2019-05-18 DIAGNOSIS — D649 Anemia, unspecified: Secondary | ICD-10-CM | POA: Diagnosis not present

## 2019-05-19 DIAGNOSIS — F062 Psychotic disorder with delusions due to known physiological condition: Secondary | ICD-10-CM | POA: Diagnosis not present

## 2019-05-19 DIAGNOSIS — F0151 Vascular dementia with behavioral disturbance: Secondary | ICD-10-CM | POA: Diagnosis not present

## 2019-05-19 DIAGNOSIS — F331 Major depressive disorder, recurrent, moderate: Secondary | ICD-10-CM | POA: Diagnosis not present

## 2019-05-22 DIAGNOSIS — Z20822 Contact with and (suspected) exposure to covid-19: Secondary | ICD-10-CM | POA: Diagnosis not present

## 2019-05-22 DIAGNOSIS — Z1152 Encounter for screening for COVID-19: Secondary | ICD-10-CM | POA: Diagnosis not present

## 2019-05-24 DIAGNOSIS — Z23 Encounter for immunization: Secondary | ICD-10-CM | POA: Diagnosis not present

## 2019-05-25 DIAGNOSIS — I739 Peripheral vascular disease, unspecified: Secondary | ICD-10-CM | POA: Diagnosis not present

## 2019-05-25 DIAGNOSIS — U071 COVID-19: Secondary | ICD-10-CM | POA: Diagnosis not present

## 2019-05-25 DIAGNOSIS — R634 Abnormal weight loss: Secondary | ICD-10-CM | POA: Diagnosis not present

## 2019-05-25 DIAGNOSIS — I639 Cerebral infarction, unspecified: Secondary | ICD-10-CM | POA: Diagnosis not present

## 2019-05-25 DIAGNOSIS — I10 Essential (primary) hypertension: Secondary | ICD-10-CM | POA: Diagnosis not present

## 2019-06-05 DIAGNOSIS — Z1152 Encounter for screening for COVID-19: Secondary | ICD-10-CM | POA: Diagnosis not present

## 2019-06-05 DIAGNOSIS — Z20822 Contact with and (suspected) exposure to covid-19: Secondary | ICD-10-CM | POA: Diagnosis not present

## 2019-06-08 DIAGNOSIS — I739 Peripheral vascular disease, unspecified: Secondary | ICD-10-CM | POA: Diagnosis not present

## 2019-06-08 DIAGNOSIS — B351 Tinea unguium: Secondary | ICD-10-CM | POA: Diagnosis not present

## 2019-06-16 DIAGNOSIS — F062 Psychotic disorder with delusions due to known physiological condition: Secondary | ICD-10-CM | POA: Diagnosis not present

## 2019-06-16 DIAGNOSIS — F331 Major depressive disorder, recurrent, moderate: Secondary | ICD-10-CM | POA: Diagnosis not present

## 2019-06-16 DIAGNOSIS — F0151 Vascular dementia with behavioral disturbance: Secondary | ICD-10-CM | POA: Diagnosis not present

## 2019-06-19 DIAGNOSIS — D649 Anemia, unspecified: Secondary | ICD-10-CM | POA: Diagnosis not present

## 2019-06-19 DIAGNOSIS — E785 Hyperlipidemia, unspecified: Secondary | ICD-10-CM | POA: Diagnosis not present

## 2019-06-19 DIAGNOSIS — Z79899 Other long term (current) drug therapy: Secondary | ICD-10-CM | POA: Diagnosis not present

## 2019-06-22 DIAGNOSIS — I69351 Hemiplegia and hemiparesis following cerebral infarction affecting right dominant side: Secondary | ICD-10-CM | POA: Diagnosis not present

## 2019-06-22 DIAGNOSIS — F0390 Unspecified dementia without behavioral disturbance: Secondary | ICD-10-CM | POA: Diagnosis not present

## 2019-06-22 DIAGNOSIS — R633 Feeding difficulties: Secondary | ICD-10-CM | POA: Diagnosis not present

## 2019-06-22 DIAGNOSIS — R278 Other lack of coordination: Secondary | ICD-10-CM | POA: Diagnosis not present

## 2019-06-22 DIAGNOSIS — R293 Abnormal posture: Secondary | ICD-10-CM | POA: Diagnosis not present

## 2019-06-23 DIAGNOSIS — F0390 Unspecified dementia without behavioral disturbance: Secondary | ICD-10-CM | POA: Diagnosis not present

## 2019-06-23 DIAGNOSIS — I739 Peripheral vascular disease, unspecified: Secondary | ICD-10-CM | POA: Diagnosis not present

## 2019-06-23 DIAGNOSIS — R627 Adult failure to thrive: Secondary | ICD-10-CM | POA: Diagnosis not present

## 2019-06-23 DIAGNOSIS — I5032 Chronic diastolic (congestive) heart failure: Secondary | ICD-10-CM | POA: Diagnosis not present

## 2019-06-23 DIAGNOSIS — R293 Abnormal posture: Secondary | ICD-10-CM | POA: Diagnosis not present

## 2019-06-23 DIAGNOSIS — I639 Cerebral infarction, unspecified: Secondary | ICD-10-CM | POA: Diagnosis not present

## 2019-06-23 DIAGNOSIS — R278 Other lack of coordination: Secondary | ICD-10-CM | POA: Diagnosis not present

## 2019-06-23 DIAGNOSIS — I69351 Hemiplegia and hemiparesis following cerebral infarction affecting right dominant side: Secondary | ICD-10-CM | POA: Diagnosis not present

## 2019-06-23 DIAGNOSIS — U071 COVID-19: Secondary | ICD-10-CM | POA: Diagnosis not present

## 2019-06-23 DIAGNOSIS — R633 Feeding difficulties: Secondary | ICD-10-CM | POA: Diagnosis not present

## 2019-06-24 DIAGNOSIS — F0390 Unspecified dementia without behavioral disturbance: Secondary | ICD-10-CM | POA: Diagnosis not present

## 2019-06-24 DIAGNOSIS — R293 Abnormal posture: Secondary | ICD-10-CM | POA: Diagnosis not present

## 2019-06-24 DIAGNOSIS — I69351 Hemiplegia and hemiparesis following cerebral infarction affecting right dominant side: Secondary | ICD-10-CM | POA: Diagnosis not present

## 2019-06-24 DIAGNOSIS — R278 Other lack of coordination: Secondary | ICD-10-CM | POA: Diagnosis not present

## 2019-06-24 DIAGNOSIS — R633 Feeding difficulties: Secondary | ICD-10-CM | POA: Diagnosis not present

## 2019-06-25 DIAGNOSIS — R633 Feeding difficulties: Secondary | ICD-10-CM | POA: Diagnosis not present

## 2019-06-25 DIAGNOSIS — R278 Other lack of coordination: Secondary | ICD-10-CM | POA: Diagnosis not present

## 2019-06-25 DIAGNOSIS — R293 Abnormal posture: Secondary | ICD-10-CM | POA: Diagnosis not present

## 2019-06-25 DIAGNOSIS — F0390 Unspecified dementia without behavioral disturbance: Secondary | ICD-10-CM | POA: Diagnosis not present

## 2019-06-25 DIAGNOSIS — I69351 Hemiplegia and hemiparesis following cerebral infarction affecting right dominant side: Secondary | ICD-10-CM | POA: Diagnosis not present

## 2019-06-27 DIAGNOSIS — F0390 Unspecified dementia without behavioral disturbance: Secondary | ICD-10-CM | POA: Diagnosis not present

## 2019-06-27 DIAGNOSIS — R633 Feeding difficulties: Secondary | ICD-10-CM | POA: Diagnosis not present

## 2019-06-27 DIAGNOSIS — I69351 Hemiplegia and hemiparesis following cerebral infarction affecting right dominant side: Secondary | ICD-10-CM | POA: Diagnosis not present

## 2019-06-27 DIAGNOSIS — R293 Abnormal posture: Secondary | ICD-10-CM | POA: Diagnosis not present

## 2019-06-27 DIAGNOSIS — R278 Other lack of coordination: Secondary | ICD-10-CM | POA: Diagnosis not present

## 2019-06-28 DIAGNOSIS — F0390 Unspecified dementia without behavioral disturbance: Secondary | ICD-10-CM | POA: Diagnosis not present

## 2019-06-28 DIAGNOSIS — R293 Abnormal posture: Secondary | ICD-10-CM | POA: Diagnosis not present

## 2019-06-28 DIAGNOSIS — I69351 Hemiplegia and hemiparesis following cerebral infarction affecting right dominant side: Secondary | ICD-10-CM | POA: Diagnosis not present

## 2019-06-28 DIAGNOSIS — R278 Other lack of coordination: Secondary | ICD-10-CM | POA: Diagnosis not present

## 2019-06-28 DIAGNOSIS — R633 Feeding difficulties: Secondary | ICD-10-CM | POA: Diagnosis not present

## 2019-06-30 DIAGNOSIS — R633 Feeding difficulties: Secondary | ICD-10-CM | POA: Diagnosis not present

## 2019-06-30 DIAGNOSIS — F0390 Unspecified dementia without behavioral disturbance: Secondary | ICD-10-CM | POA: Diagnosis not present

## 2019-06-30 DIAGNOSIS — I69351 Hemiplegia and hemiparesis following cerebral infarction affecting right dominant side: Secondary | ICD-10-CM | POA: Diagnosis not present

## 2019-06-30 DIAGNOSIS — R278 Other lack of coordination: Secondary | ICD-10-CM | POA: Diagnosis not present

## 2019-06-30 DIAGNOSIS — R293 Abnormal posture: Secondary | ICD-10-CM | POA: Diagnosis not present

## 2019-07-01 DIAGNOSIS — F0390 Unspecified dementia without behavioral disturbance: Secondary | ICD-10-CM | POA: Diagnosis not present

## 2019-07-01 DIAGNOSIS — R278 Other lack of coordination: Secondary | ICD-10-CM | POA: Diagnosis not present

## 2019-07-01 DIAGNOSIS — I69351 Hemiplegia and hemiparesis following cerebral infarction affecting right dominant side: Secondary | ICD-10-CM | POA: Diagnosis not present

## 2019-07-01 DIAGNOSIS — R633 Feeding difficulties: Secondary | ICD-10-CM | POA: Diagnosis not present

## 2019-07-01 DIAGNOSIS — R293 Abnormal posture: Secondary | ICD-10-CM | POA: Diagnosis not present

## 2019-07-03 DIAGNOSIS — R633 Feeding difficulties: Secondary | ICD-10-CM | POA: Diagnosis not present

## 2019-07-03 DIAGNOSIS — R278 Other lack of coordination: Secondary | ICD-10-CM | POA: Diagnosis not present

## 2019-07-03 DIAGNOSIS — R293 Abnormal posture: Secondary | ICD-10-CM | POA: Diagnosis not present

## 2019-07-03 DIAGNOSIS — F0390 Unspecified dementia without behavioral disturbance: Secondary | ICD-10-CM | POA: Diagnosis not present

## 2019-07-03 DIAGNOSIS — I69351 Hemiplegia and hemiparesis following cerebral infarction affecting right dominant side: Secondary | ICD-10-CM | POA: Diagnosis not present

## 2019-07-04 DIAGNOSIS — R293 Abnormal posture: Secondary | ICD-10-CM | POA: Diagnosis not present

## 2019-07-04 DIAGNOSIS — R633 Feeding difficulties: Secondary | ICD-10-CM | POA: Diagnosis not present

## 2019-07-04 DIAGNOSIS — R278 Other lack of coordination: Secondary | ICD-10-CM | POA: Diagnosis not present

## 2019-07-04 DIAGNOSIS — I69351 Hemiplegia and hemiparesis following cerebral infarction affecting right dominant side: Secondary | ICD-10-CM | POA: Diagnosis not present

## 2019-07-04 DIAGNOSIS — F0390 Unspecified dementia without behavioral disturbance: Secondary | ICD-10-CM | POA: Diagnosis not present

## 2019-07-05 DIAGNOSIS — R633 Feeding difficulties: Secondary | ICD-10-CM | POA: Diagnosis not present

## 2019-07-05 DIAGNOSIS — I69351 Hemiplegia and hemiparesis following cerebral infarction affecting right dominant side: Secondary | ICD-10-CM | POA: Diagnosis not present

## 2019-07-05 DIAGNOSIS — R278 Other lack of coordination: Secondary | ICD-10-CM | POA: Diagnosis not present

## 2019-07-05 DIAGNOSIS — R293 Abnormal posture: Secondary | ICD-10-CM | POA: Diagnosis not present

## 2019-07-05 DIAGNOSIS — F0390 Unspecified dementia without behavioral disturbance: Secondary | ICD-10-CM | POA: Diagnosis not present

## 2019-07-06 DIAGNOSIS — R278 Other lack of coordination: Secondary | ICD-10-CM | POA: Diagnosis not present

## 2019-07-06 DIAGNOSIS — R633 Feeding difficulties: Secondary | ICD-10-CM | POA: Diagnosis not present

## 2019-07-06 DIAGNOSIS — R293 Abnormal posture: Secondary | ICD-10-CM | POA: Diagnosis not present

## 2019-07-06 DIAGNOSIS — F0390 Unspecified dementia without behavioral disturbance: Secondary | ICD-10-CM | POA: Diagnosis not present

## 2019-07-06 DIAGNOSIS — I69351 Hemiplegia and hemiparesis following cerebral infarction affecting right dominant side: Secondary | ICD-10-CM | POA: Diagnosis not present

## 2019-07-07 DIAGNOSIS — R278 Other lack of coordination: Secondary | ICD-10-CM | POA: Diagnosis not present

## 2019-07-07 DIAGNOSIS — F0390 Unspecified dementia without behavioral disturbance: Secondary | ICD-10-CM | POA: Diagnosis not present

## 2019-07-07 DIAGNOSIS — I69351 Hemiplegia and hemiparesis following cerebral infarction affecting right dominant side: Secondary | ICD-10-CM | POA: Diagnosis not present

## 2019-07-07 DIAGNOSIS — R633 Feeding difficulties: Secondary | ICD-10-CM | POA: Diagnosis not present

## 2019-07-07 DIAGNOSIS — R293 Abnormal posture: Secondary | ICD-10-CM | POA: Diagnosis not present

## 2019-07-10 DIAGNOSIS — I5032 Chronic diastolic (congestive) heart failure: Secondary | ICD-10-CM | POA: Diagnosis not present

## 2019-07-10 DIAGNOSIS — I639 Cerebral infarction, unspecified: Secondary | ICD-10-CM | POA: Diagnosis not present

## 2019-07-10 DIAGNOSIS — I10 Essential (primary) hypertension: Secondary | ICD-10-CM | POA: Diagnosis not present

## 2019-07-10 DIAGNOSIS — I69351 Hemiplegia and hemiparesis following cerebral infarction affecting right dominant side: Secondary | ICD-10-CM | POA: Diagnosis not present

## 2019-07-11 DIAGNOSIS — I69351 Hemiplegia and hemiparesis following cerebral infarction affecting right dominant side: Secondary | ICD-10-CM | POA: Diagnosis not present

## 2019-07-11 DIAGNOSIS — R278 Other lack of coordination: Secondary | ICD-10-CM | POA: Diagnosis not present

## 2019-07-11 DIAGNOSIS — F0390 Unspecified dementia without behavioral disturbance: Secondary | ICD-10-CM | POA: Diagnosis not present

## 2019-07-11 DIAGNOSIS — R293 Abnormal posture: Secondary | ICD-10-CM | POA: Diagnosis not present

## 2019-07-11 DIAGNOSIS — R633 Feeding difficulties: Secondary | ICD-10-CM | POA: Diagnosis not present

## 2019-07-12 DIAGNOSIS — F0390 Unspecified dementia without behavioral disturbance: Secondary | ICD-10-CM | POA: Diagnosis not present

## 2019-07-12 DIAGNOSIS — R278 Other lack of coordination: Secondary | ICD-10-CM | POA: Diagnosis not present

## 2019-07-12 DIAGNOSIS — I69351 Hemiplegia and hemiparesis following cerebral infarction affecting right dominant side: Secondary | ICD-10-CM | POA: Diagnosis not present

## 2019-07-12 DIAGNOSIS — R293 Abnormal posture: Secondary | ICD-10-CM | POA: Diagnosis not present

## 2019-07-12 DIAGNOSIS — R633 Feeding difficulties: Secondary | ICD-10-CM | POA: Diagnosis not present

## 2019-07-13 DIAGNOSIS — R278 Other lack of coordination: Secondary | ICD-10-CM | POA: Diagnosis not present

## 2019-07-13 DIAGNOSIS — R293 Abnormal posture: Secondary | ICD-10-CM | POA: Diagnosis not present

## 2019-07-13 DIAGNOSIS — I69351 Hemiplegia and hemiparesis following cerebral infarction affecting right dominant side: Secondary | ICD-10-CM | POA: Diagnosis not present

## 2019-07-13 DIAGNOSIS — F0390 Unspecified dementia without behavioral disturbance: Secondary | ICD-10-CM | POA: Diagnosis not present

## 2019-07-14 DIAGNOSIS — F0390 Unspecified dementia without behavioral disturbance: Secondary | ICD-10-CM | POA: Diagnosis not present

## 2019-07-14 DIAGNOSIS — I69351 Hemiplegia and hemiparesis following cerebral infarction affecting right dominant side: Secondary | ICD-10-CM | POA: Diagnosis not present

## 2019-07-14 DIAGNOSIS — R293 Abnormal posture: Secondary | ICD-10-CM | POA: Diagnosis not present

## 2019-07-14 DIAGNOSIS — R278 Other lack of coordination: Secondary | ICD-10-CM | POA: Diagnosis not present

## 2019-07-15 DIAGNOSIS — R293 Abnormal posture: Secondary | ICD-10-CM | POA: Diagnosis not present

## 2019-07-15 DIAGNOSIS — R278 Other lack of coordination: Secondary | ICD-10-CM | POA: Diagnosis not present

## 2019-07-15 DIAGNOSIS — F0390 Unspecified dementia without behavioral disturbance: Secondary | ICD-10-CM | POA: Diagnosis not present

## 2019-07-15 DIAGNOSIS — I69351 Hemiplegia and hemiparesis following cerebral infarction affecting right dominant side: Secondary | ICD-10-CM | POA: Diagnosis not present

## 2019-07-17 DIAGNOSIS — R278 Other lack of coordination: Secondary | ICD-10-CM | POA: Diagnosis not present

## 2019-07-17 DIAGNOSIS — I69351 Hemiplegia and hemiparesis following cerebral infarction affecting right dominant side: Secondary | ICD-10-CM | POA: Diagnosis not present

## 2019-07-17 DIAGNOSIS — F0390 Unspecified dementia without behavioral disturbance: Secondary | ICD-10-CM | POA: Diagnosis not present

## 2019-07-17 DIAGNOSIS — R293 Abnormal posture: Secondary | ICD-10-CM | POA: Diagnosis not present

## 2019-07-18 DIAGNOSIS — R278 Other lack of coordination: Secondary | ICD-10-CM | POA: Diagnosis not present

## 2019-07-18 DIAGNOSIS — F0390 Unspecified dementia without behavioral disturbance: Secondary | ICD-10-CM | POA: Diagnosis not present

## 2019-07-18 DIAGNOSIS — I69351 Hemiplegia and hemiparesis following cerebral infarction affecting right dominant side: Secondary | ICD-10-CM | POA: Diagnosis not present

## 2019-07-18 DIAGNOSIS — R293 Abnormal posture: Secondary | ICD-10-CM | POA: Diagnosis not present

## 2019-07-19 DIAGNOSIS — R293 Abnormal posture: Secondary | ICD-10-CM | POA: Diagnosis not present

## 2019-07-19 DIAGNOSIS — F0390 Unspecified dementia without behavioral disturbance: Secondary | ICD-10-CM | POA: Diagnosis not present

## 2019-07-19 DIAGNOSIS — R278 Other lack of coordination: Secondary | ICD-10-CM | POA: Diagnosis not present

## 2019-07-19 DIAGNOSIS — I69351 Hemiplegia and hemiparesis following cerebral infarction affecting right dominant side: Secondary | ICD-10-CM | POA: Diagnosis not present

## 2019-07-20 DIAGNOSIS — F0390 Unspecified dementia without behavioral disturbance: Secondary | ICD-10-CM | POA: Diagnosis not present

## 2019-07-20 DIAGNOSIS — R278 Other lack of coordination: Secondary | ICD-10-CM | POA: Diagnosis not present

## 2019-07-20 DIAGNOSIS — R293 Abnormal posture: Secondary | ICD-10-CM | POA: Diagnosis not present

## 2019-07-20 DIAGNOSIS — I69351 Hemiplegia and hemiparesis following cerebral infarction affecting right dominant side: Secondary | ICD-10-CM | POA: Diagnosis not present

## 2019-07-21 DIAGNOSIS — F0390 Unspecified dementia without behavioral disturbance: Secondary | ICD-10-CM | POA: Diagnosis not present

## 2019-07-21 DIAGNOSIS — I69351 Hemiplegia and hemiparesis following cerebral infarction affecting right dominant side: Secondary | ICD-10-CM | POA: Diagnosis not present

## 2019-07-21 DIAGNOSIS — R278 Other lack of coordination: Secondary | ICD-10-CM | POA: Diagnosis not present

## 2019-07-21 DIAGNOSIS — R293 Abnormal posture: Secondary | ICD-10-CM | POA: Diagnosis not present

## 2019-07-23 DIAGNOSIS — R278 Other lack of coordination: Secondary | ICD-10-CM | POA: Diagnosis not present

## 2019-07-23 DIAGNOSIS — R293 Abnormal posture: Secondary | ICD-10-CM | POA: Diagnosis not present

## 2019-07-23 DIAGNOSIS — F0390 Unspecified dementia without behavioral disturbance: Secondary | ICD-10-CM | POA: Diagnosis not present

## 2019-07-23 DIAGNOSIS — I69351 Hemiplegia and hemiparesis following cerebral infarction affecting right dominant side: Secondary | ICD-10-CM | POA: Diagnosis not present

## 2019-07-24 DIAGNOSIS — R278 Other lack of coordination: Secondary | ICD-10-CM | POA: Diagnosis not present

## 2019-07-24 DIAGNOSIS — F0390 Unspecified dementia without behavioral disturbance: Secondary | ICD-10-CM | POA: Diagnosis not present

## 2019-07-24 DIAGNOSIS — I69351 Hemiplegia and hemiparesis following cerebral infarction affecting right dominant side: Secondary | ICD-10-CM | POA: Diagnosis not present

## 2019-07-24 DIAGNOSIS — D649 Anemia, unspecified: Secondary | ICD-10-CM | POA: Diagnosis not present

## 2019-07-24 DIAGNOSIS — R293 Abnormal posture: Secondary | ICD-10-CM | POA: Diagnosis not present

## 2019-07-26 DIAGNOSIS — F331 Major depressive disorder, recurrent, moderate: Secondary | ICD-10-CM | POA: Diagnosis not present

## 2019-07-26 DIAGNOSIS — R293 Abnormal posture: Secondary | ICD-10-CM | POA: Diagnosis not present

## 2019-07-26 DIAGNOSIS — F0390 Unspecified dementia without behavioral disturbance: Secondary | ICD-10-CM | POA: Diagnosis not present

## 2019-07-26 DIAGNOSIS — F5101 Primary insomnia: Secondary | ICD-10-CM | POA: Diagnosis not present

## 2019-07-26 DIAGNOSIS — R278 Other lack of coordination: Secondary | ICD-10-CM | POA: Diagnosis not present

## 2019-07-26 DIAGNOSIS — F0151 Vascular dementia with behavioral disturbance: Secondary | ICD-10-CM | POA: Diagnosis not present

## 2019-07-26 DIAGNOSIS — F200 Paranoid schizophrenia: Secondary | ICD-10-CM | POA: Diagnosis not present

## 2019-07-26 DIAGNOSIS — I69351 Hemiplegia and hemiparesis following cerebral infarction affecting right dominant side: Secondary | ICD-10-CM | POA: Diagnosis not present

## 2019-07-26 DIAGNOSIS — F062 Psychotic disorder with delusions due to known physiological condition: Secondary | ICD-10-CM | POA: Diagnosis not present

## 2019-07-27 DIAGNOSIS — R278 Other lack of coordination: Secondary | ICD-10-CM | POA: Diagnosis not present

## 2019-07-27 DIAGNOSIS — I69351 Hemiplegia and hemiparesis following cerebral infarction affecting right dominant side: Secondary | ICD-10-CM | POA: Diagnosis not present

## 2019-07-27 DIAGNOSIS — F0390 Unspecified dementia without behavioral disturbance: Secondary | ICD-10-CM | POA: Diagnosis not present

## 2019-07-27 DIAGNOSIS — R293 Abnormal posture: Secondary | ICD-10-CM | POA: Diagnosis not present

## 2019-07-29 DIAGNOSIS — R278 Other lack of coordination: Secondary | ICD-10-CM | POA: Diagnosis not present

## 2019-07-29 DIAGNOSIS — F0390 Unspecified dementia without behavioral disturbance: Secondary | ICD-10-CM | POA: Diagnosis not present

## 2019-07-29 DIAGNOSIS — R293 Abnormal posture: Secondary | ICD-10-CM | POA: Diagnosis not present

## 2019-07-29 DIAGNOSIS — I69351 Hemiplegia and hemiparesis following cerebral infarction affecting right dominant side: Secondary | ICD-10-CM | POA: Diagnosis not present

## 2019-07-30 DIAGNOSIS — F0390 Unspecified dementia without behavioral disturbance: Secondary | ICD-10-CM | POA: Diagnosis not present

## 2019-07-30 DIAGNOSIS — R278 Other lack of coordination: Secondary | ICD-10-CM | POA: Diagnosis not present

## 2019-07-30 DIAGNOSIS — I69351 Hemiplegia and hemiparesis following cerebral infarction affecting right dominant side: Secondary | ICD-10-CM | POA: Diagnosis not present

## 2019-07-30 DIAGNOSIS — R293 Abnormal posture: Secondary | ICD-10-CM | POA: Diagnosis not present

## 2019-07-31 DIAGNOSIS — R278 Other lack of coordination: Secondary | ICD-10-CM | POA: Diagnosis not present

## 2019-07-31 DIAGNOSIS — R293 Abnormal posture: Secondary | ICD-10-CM | POA: Diagnosis not present

## 2019-07-31 DIAGNOSIS — I69351 Hemiplegia and hemiparesis following cerebral infarction affecting right dominant side: Secondary | ICD-10-CM | POA: Diagnosis not present

## 2019-07-31 DIAGNOSIS — F0390 Unspecified dementia without behavioral disturbance: Secondary | ICD-10-CM | POA: Diagnosis not present

## 2019-08-01 DIAGNOSIS — F0390 Unspecified dementia without behavioral disturbance: Secondary | ICD-10-CM | POA: Diagnosis not present

## 2019-08-01 DIAGNOSIS — I69351 Hemiplegia and hemiparesis following cerebral infarction affecting right dominant side: Secondary | ICD-10-CM | POA: Diagnosis not present

## 2019-08-01 DIAGNOSIS — R293 Abnormal posture: Secondary | ICD-10-CM | POA: Diagnosis not present

## 2019-08-01 DIAGNOSIS — R278 Other lack of coordination: Secondary | ICD-10-CM | POA: Diagnosis not present

## 2019-08-02 DIAGNOSIS — R293 Abnormal posture: Secondary | ICD-10-CM | POA: Diagnosis not present

## 2019-08-02 DIAGNOSIS — R278 Other lack of coordination: Secondary | ICD-10-CM | POA: Diagnosis not present

## 2019-08-02 DIAGNOSIS — F0390 Unspecified dementia without behavioral disturbance: Secondary | ICD-10-CM | POA: Diagnosis not present

## 2019-08-02 DIAGNOSIS — I69351 Hemiplegia and hemiparesis following cerebral infarction affecting right dominant side: Secondary | ICD-10-CM | POA: Diagnosis not present

## 2019-08-08 DIAGNOSIS — I639 Cerebral infarction, unspecified: Secondary | ICD-10-CM | POA: Diagnosis not present

## 2019-08-08 DIAGNOSIS — I69391 Dysphagia following cerebral infarction: Secondary | ICD-10-CM | POA: Diagnosis not present

## 2019-08-08 DIAGNOSIS — I10 Essential (primary) hypertension: Secondary | ICD-10-CM | POA: Diagnosis not present

## 2019-08-08 DIAGNOSIS — M79606 Pain in leg, unspecified: Secondary | ICD-10-CM | POA: Diagnosis not present

## 2019-08-15 DIAGNOSIS — I739 Peripheral vascular disease, unspecified: Secondary | ICD-10-CM | POA: Diagnosis not present

## 2019-08-15 DIAGNOSIS — B351 Tinea unguium: Secondary | ICD-10-CM | POA: Diagnosis not present

## 2019-08-22 DIAGNOSIS — D649 Anemia, unspecified: Secondary | ICD-10-CM | POA: Diagnosis not present

## 2019-08-23 DIAGNOSIS — F0151 Vascular dementia with behavioral disturbance: Secondary | ICD-10-CM | POA: Diagnosis not present

## 2019-08-23 DIAGNOSIS — F200 Paranoid schizophrenia: Secondary | ICD-10-CM | POA: Diagnosis not present

## 2019-08-23 DIAGNOSIS — F331 Major depressive disorder, recurrent, moderate: Secondary | ICD-10-CM | POA: Diagnosis not present

## 2019-08-23 DIAGNOSIS — F5101 Primary insomnia: Secondary | ICD-10-CM | POA: Diagnosis not present

## 2019-08-23 DIAGNOSIS — F062 Psychotic disorder with delusions due to known physiological condition: Secondary | ICD-10-CM | POA: Diagnosis not present

## 2019-08-26 DIAGNOSIS — Z79899 Other long term (current) drug therapy: Secondary | ICD-10-CM | POA: Diagnosis not present

## 2019-08-26 DIAGNOSIS — I129 Hypertensive chronic kidney disease with stage 1 through stage 4 chronic kidney disease, or unspecified chronic kidney disease: Secondary | ICD-10-CM | POA: Diagnosis not present

## 2019-08-26 DIAGNOSIS — R509 Fever, unspecified: Secondary | ICD-10-CM | POA: Diagnosis not present

## 2019-08-26 DIAGNOSIS — I5032 Chronic diastolic (congestive) heart failure: Secondary | ICD-10-CM | POA: Diagnosis not present

## 2019-08-26 DIAGNOSIS — I639 Cerebral infarction, unspecified: Secondary | ICD-10-CM | POA: Diagnosis not present

## 2019-08-27 DIAGNOSIS — Z79899 Other long term (current) drug therapy: Secondary | ICD-10-CM | POA: Diagnosis not present

## 2019-08-28 ENCOUNTER — Other Ambulatory Visit: Payer: Self-pay

## 2019-08-28 ENCOUNTER — Inpatient Hospital Stay (HOSPITAL_COMMUNITY)
Admission: EM | Admit: 2019-08-28 | Discharge: 2019-09-03 | DRG: 682 | Disposition: A | Discharge status: Skilled Nursing Facility | Payer: Medicare Other | Source: Skilled Nursing Facility | Attending: Internal Medicine | Admitting: Internal Medicine

## 2019-08-28 ENCOUNTER — Emergency Department (HOSPITAL_COMMUNITY): Payer: Medicare Other

## 2019-08-28 ENCOUNTER — Encounter (HOSPITAL_COMMUNITY): Payer: Self-pay

## 2019-08-28 DIAGNOSIS — Z91018 Allergy to other foods: Secondary | ICD-10-CM

## 2019-08-28 DIAGNOSIS — Z681 Body mass index (BMI) 19 or less, adult: Secondary | ICD-10-CM

## 2019-08-28 DIAGNOSIS — E875 Hyperkalemia: Secondary | ICD-10-CM

## 2019-08-28 DIAGNOSIS — R823 Hemoglobinuria: Secondary | ICD-10-CM | POA: Diagnosis present

## 2019-08-28 DIAGNOSIS — F259 Schizoaffective disorder, unspecified: Secondary | ICD-10-CM | POA: Diagnosis present

## 2019-08-28 DIAGNOSIS — I160 Hypertensive urgency: Secondary | ICD-10-CM | POA: Diagnosis present

## 2019-08-28 DIAGNOSIS — Z20822 Contact with and (suspected) exposure to covid-19: Secondary | ICD-10-CM | POA: Diagnosis present

## 2019-08-28 DIAGNOSIS — R627 Adult failure to thrive: Secondary | ICD-10-CM | POA: Diagnosis not present

## 2019-08-28 DIAGNOSIS — Z8249 Family history of ischemic heart disease and other diseases of the circulatory system: Secondary | ICD-10-CM

## 2019-08-28 DIAGNOSIS — I639 Cerebral infarction, unspecified: Secondary | ICD-10-CM | POA: Diagnosis not present

## 2019-08-28 DIAGNOSIS — R1310 Dysphagia, unspecified: Secondary | ICD-10-CM | POA: Diagnosis present

## 2019-08-28 DIAGNOSIS — I10 Essential (primary) hypertension: Secondary | ICD-10-CM | POA: Diagnosis not present

## 2019-08-28 DIAGNOSIS — N179 Acute kidney failure, unspecified: Secondary | ICD-10-CM | POA: Diagnosis not present

## 2019-08-28 DIAGNOSIS — E869 Volume depletion, unspecified: Secondary | ICD-10-CM | POA: Diagnosis present

## 2019-08-28 DIAGNOSIS — N390 Urinary tract infection, site not specified: Secondary | ICD-10-CM | POA: Diagnosis present

## 2019-08-28 DIAGNOSIS — I6529 Occlusion and stenosis of unspecified carotid artery: Secondary | ICD-10-CM | POA: Diagnosis present

## 2019-08-28 DIAGNOSIS — I69351 Hemiplegia and hemiparesis following cerebral infarction affecting right dominant side: Secondary | ICD-10-CM | POA: Diagnosis not present

## 2019-08-28 DIAGNOSIS — E440 Moderate protein-calorie malnutrition: Secondary | ICD-10-CM | POA: Diagnosis present

## 2019-08-28 DIAGNOSIS — N184 Chronic kidney disease, stage 4 (severe): Secondary | ICD-10-CM | POA: Diagnosis present

## 2019-08-28 DIAGNOSIS — I739 Peripheral vascular disease, unspecified: Secondary | ICD-10-CM | POA: Diagnosis not present

## 2019-08-28 DIAGNOSIS — E785 Hyperlipidemia, unspecified: Secondary | ICD-10-CM | POA: Diagnosis present

## 2019-08-28 DIAGNOSIS — G9341 Metabolic encephalopathy: Secondary | ICD-10-CM

## 2019-08-28 DIAGNOSIS — R509 Fever, unspecified: Secondary | ICD-10-CM | POA: Diagnosis not present

## 2019-08-28 DIAGNOSIS — I130 Hypertensive heart and chronic kidney disease with heart failure and stage 1 through stage 4 chronic kidney disease, or unspecified chronic kidney disease: Secondary | ICD-10-CM | POA: Diagnosis not present

## 2019-08-28 DIAGNOSIS — N189 Chronic kidney disease, unspecified: Secondary | ICD-10-CM | POA: Diagnosis present

## 2019-08-28 DIAGNOSIS — R7881 Bacteremia: Secondary | ICD-10-CM | POA: Diagnosis present

## 2019-08-28 DIAGNOSIS — D649 Anemia, unspecified: Secondary | ICD-10-CM | POA: Diagnosis present

## 2019-08-28 DIAGNOSIS — D631 Anemia in chronic kidney disease: Secondary | ICD-10-CM | POA: Diagnosis present

## 2019-08-28 DIAGNOSIS — E162 Hypoglycemia, unspecified: Secondary | ICD-10-CM | POA: Diagnosis present

## 2019-08-28 DIAGNOSIS — F17210 Nicotine dependence, cigarettes, uncomplicated: Secondary | ICD-10-CM | POA: Diagnosis present

## 2019-08-28 DIAGNOSIS — B951 Streptococcus, group B, as the cause of diseases classified elsewhere: Secondary | ICD-10-CM | POA: Diagnosis present

## 2019-08-28 DIAGNOSIS — Z66 Do not resuscitate: Secondary | ICD-10-CM | POA: Diagnosis present

## 2019-08-28 DIAGNOSIS — Z7902 Long term (current) use of antithrombotics/antiplatelets: Secondary | ICD-10-CM

## 2019-08-28 DIAGNOSIS — N170 Acute kidney failure with tubular necrosis: Secondary | ICD-10-CM | POA: Diagnosis not present

## 2019-08-28 DIAGNOSIS — F0150 Vascular dementia without behavioral disturbance: Secondary | ICD-10-CM | POA: Diagnosis present

## 2019-08-28 DIAGNOSIS — U071 COVID-19: Secondary | ICD-10-CM | POA: Diagnosis not present

## 2019-08-28 DIAGNOSIS — Z79899 Other long term (current) drug therapy: Secondary | ICD-10-CM

## 2019-08-28 DIAGNOSIS — R9431 Abnormal electrocardiogram [ECG] [EKG]: Secondary | ICD-10-CM | POA: Diagnosis present

## 2019-08-28 DIAGNOSIS — R630 Anorexia: Secondary | ICD-10-CM | POA: Diagnosis present

## 2019-08-28 DIAGNOSIS — Z8616 Personal history of COVID-19: Secondary | ICD-10-CM

## 2019-08-28 DIAGNOSIS — I5032 Chronic diastolic (congestive) heart failure: Secondary | ICD-10-CM | POA: Diagnosis present

## 2019-08-28 HISTORY — DX: Adult failure to thrive: R62.7

## 2019-08-28 HISTORY — DX: Unspecified dementia, unspecified severity, without behavioral disturbance, psychotic disturbance, mood disturbance, and anxiety: F03.90

## 2019-08-28 HISTORY — DX: Unspecified psychosis not due to a substance or known physiological condition: F29

## 2019-08-28 HISTORY — DX: Metabolic encephalopathy: G93.41

## 2019-08-28 HISTORY — DX: Gastrointestinal hemorrhage, unspecified: K92.2

## 2019-08-28 HISTORY — DX: Dysphagia, unspecified: R13.10

## 2019-08-28 HISTORY — DX: Schizoaffective disorder, unspecified: F25.9

## 2019-08-28 HISTORY — DX: Peripheral vascular disease, unspecified: I73.9

## 2019-08-28 HISTORY — DX: Alcohol dependence, uncomplicated: F10.20

## 2019-08-28 HISTORY — DX: Unspecified cataract: H26.9

## 2019-08-28 HISTORY — DX: Heart failure, unspecified: I50.9

## 2019-08-28 HISTORY — DX: Altered mental status, unspecified: R41.82

## 2019-08-28 HISTORY — DX: Anorexia: R63.0

## 2019-08-28 LAB — URINALYSIS, ROUTINE W REFLEX MICROSCOPIC
Bilirubin Urine: NEGATIVE
Glucose, UA: 50 mg/dL — AB
Ketones, ur: NEGATIVE mg/dL
Nitrite: NEGATIVE
Protein, ur: 100 mg/dL — AB
Specific Gravity, Urine: 1.011 (ref 1.005–1.030)
WBC, UA: >50 WBC/hpf — ABNORMAL HIGH (ref 0–5)
pH: 7 (ref 5.0–8.0)

## 2019-08-28 LAB — COMPREHENSIVE METABOLIC PANEL
ALT: 12 U/L (ref 0–44)
AST: 15 U/L (ref 15–41)
Albumin: 3.7 g/dL (ref 3.5–5.0)
Alkaline Phosphatase: 79 U/L (ref 38–126)
Anion gap: 9 (ref 5–15)
BUN: 85 mg/dL — ABNORMAL HIGH (ref 8–23)
CO2: 27 mmol/L (ref 22–32)
Calcium: 8.8 mg/dL — ABNORMAL LOW (ref 8.9–10.3)
Chloride: 105 mmol/L (ref 98–111)
Creatinine, Ser: 4.05 mg/dL — ABNORMAL HIGH (ref 0.61–1.24)
GFR calc Af Amer: 17 mL/min — ABNORMAL LOW (ref >60)
GFR calc non Af Amer: 14 mL/min — ABNORMAL LOW (ref >60)
Glucose, Bld: 108 mg/dL — ABNORMAL HIGH (ref 70–99)
Potassium: 6.1 mmol/L — ABNORMAL HIGH (ref 3.5–5.1)
Sodium: 141 mmol/L (ref 135–145)
Total Bilirubin: 0.5 mg/dL (ref 0.3–1.2)
Total Protein: 7.8 g/dL (ref 6.5–8.1)

## 2019-08-28 LAB — CBC WITH DIFFERENTIAL/PLATELET
Abs Immature Granulocytes: 0.04 10*3/uL (ref 0.00–0.07)
Basophils Absolute: 0 10*3/uL (ref 0.0–0.1)
Basophils Relative: 0 %
Eosinophils Absolute: 0.5 10*3/uL (ref 0.0–0.5)
Eosinophils Relative: 6 %
HCT: 36.7 % — ABNORMAL LOW (ref 39.0–52.0)
Hemoglobin: 11.1 g/dL — ABNORMAL LOW (ref 13.0–17.0)
Immature Granulocytes: 1 %
Lymphocytes Relative: 19 %
Lymphs Abs: 1.4 10*3/uL (ref 0.7–4.0)
MCH: 26.4 pg (ref 26.0–34.0)
MCHC: 30.2 g/dL (ref 30.0–36.0)
MCV: 87.4 fL (ref 80.0–100.0)
Monocytes Absolute: 0.6 10*3/uL (ref 0.1–1.0)
Monocytes Relative: 8 %
Neutro Abs: 5 10*3/uL (ref 1.7–7.7)
Neutrophils Relative %: 66 %
Platelets: 262 10*3/uL (ref 150–400)
RBC: 4.2 MIL/uL — ABNORMAL LOW (ref 4.22–5.81)
RDW: 16.5 % — ABNORMAL HIGH (ref 11.5–15.5)
WBC: 7.6 10*3/uL (ref 4.0–10.5)
nRBC: 0 % (ref 0.0–0.2)

## 2019-08-28 LAB — GLUCOSE, CAPILLARY
Glucose-Capillary: 49 mg/dL — ABNORMAL LOW (ref 70–99)
Glucose-Capillary: 65 mg/dL — ABNORMAL LOW (ref 70–99)

## 2019-08-28 LAB — MAGNESIUM: Magnesium: 3 mg/dL — ABNORMAL HIGH (ref 1.7–2.4)

## 2019-08-28 LAB — CBG MONITORING, ED: Glucose-Capillary: 98 mg/dL (ref 70–99)

## 2019-08-28 LAB — SARS CORONAVIRUS 2 BY RT PCR (HOSPITAL ORDER, PERFORMED IN ~~LOC~~ HOSPITAL LAB): SARS Coronavirus 2: NEGATIVE

## 2019-08-28 MED ORDER — INSULIN ASPART 100 UNIT/ML IV SOLN
5.0000 [IU] | Freq: Once | INTRAVENOUS | Status: AC
  Administered 2019-08-28: 5 [IU] via INTRAVENOUS

## 2019-08-28 MED ORDER — ACETAMINOPHEN 650 MG RE SUPP: 650.0000 mg | Freq: Four times a day (QID) | RECTAL | Status: DC | PRN

## 2019-08-28 MED ORDER — DEXTROSE 10 % IV SOLN: INTRAVENOUS | Status: DC

## 2019-08-28 MED ORDER — ACETAMINOPHEN 325 MG PO TABS
650.0000 mg | ORAL_TABLET | Freq: Four times a day (QID) | ORAL | Status: DC | PRN
  Administered 2019-08-30 – 2019-09-01 (×2): 650 mg via ORAL
  Filled 2019-08-28 (×3): qty 2

## 2019-08-28 MED ORDER — HYDRALAZINE HCL 20 MG/ML IJ SOLN
20.0000 mg | Freq: Once | INTRAMUSCULAR | Status: AC
  Administered 2019-08-28: 20 mg via INTRAVENOUS
  Filled 2019-08-28: qty 1

## 2019-08-28 MED ORDER — SODIUM CHLORIDE 0.9 % IV BOLUS
500.0000 mL | Freq: Once | INTRAVENOUS | Status: AC
  Administered 2019-08-28: 500 mL via INTRAVENOUS

## 2019-08-28 MED ORDER — DEXTROSE 50 % IV SOLN
1.0000 | Freq: Once | INTRAVENOUS | Status: AC
  Administered 2019-08-28: 50 mL via INTRAVENOUS
  Filled 2019-08-28: qty 50

## 2019-08-28 MED ORDER — CALCIUM GLUCONATE 10 % IV SOLN
1.0000 g | Freq: Once | INTRAVENOUS | Status: AC
  Administered 2019-08-28: 1 g via INTRAVENOUS
  Filled 2019-08-28: qty 10

## 2019-08-28 MED ORDER — ONDANSETRON HCL 4 MG/2ML IJ SOLN: 4.0000 mg | Freq: Four times a day (QID) | INTRAMUSCULAR | Status: DC | PRN

## 2019-08-28 MED ORDER — SODIUM ZIRCONIUM CYCLOSILICATE 10 G PO PACK
10.0000 g | PACK | Freq: Every day | ORAL | Status: DC
  Administered 2019-08-28: 10 g via ORAL
  Filled 2019-08-28: qty 2
  Filled 2019-08-28: qty 1

## 2019-08-28 MED ORDER — ONDANSETRON HCL 4 MG PO TABS: 4.0000 mg | ORAL_TABLET | Freq: Four times a day (QID) | ORAL | Status: DC | PRN

## 2019-08-28 MED ORDER — SODIUM CHLORIDE 0.9 % IV SOLN
1.0000 g | Freq: Once | INTRAVENOUS | Status: AC
  Administered 2019-08-29: 1 g via INTRAVENOUS
  Filled 2019-08-28: qty 10

## 2019-08-28 MED ORDER — SODIUM CHLORIDE 0.45 % IV SOLN: INTRAVENOUS | Status: DC

## 2019-08-28 MED ORDER — SODIUM CHLORIDE 0.9 % IV SOLN
1.0000 g | INTRAVENOUS | Status: DC
  Administered 2019-08-29 – 2019-09-02 (×5): 1 g via INTRAVENOUS
  Filled 2019-08-28 (×5): qty 10

## 2019-08-28 NOTE — ED Triage Notes (Signed)
Per [employer], pt had a fever over the weekend and reports had blood work drawn.  Pt c/o hurting all over.  Staff reports pt's BUN 89, creatinine 4.52, and K 5.9.  Reports pt alert and oriented but gets confused at times at baseline.  Reports pt requires total care except for feeding.

## 2019-08-28 NOTE — H&P (Signed)
History and Physical    Lucas [name] VHQ:[id number] DOB: 1950/10/10 DOA: 08/28/2019  PCP: [name], MD   Patient coming from:  [place].  I have personally briefly reviewed patient's old medical records in [place]  Chief Complaint: Abnormal lab work.  HPI: Lucas [name] is a 69 y.o. male with medical history significant of dependence, history of AMS, anorexia, carotid artery stenosis, cataracts, chronic diastolic heart failure, stage IV chronic kidney disease, history of ischemic CVA, dementia, dysphagia, history of failure to thrive, history of GI bleed, hyperlipidemia, hypertension, history of metabolic encephalopathy, pro vascular disease, cocaine use, schizoaffective disorder who is brought to the emergency department from [facility] due to abnormal blood test and fever over the weekend.  He is unable to provide much history and we were unable to obtain further history from the nursing home staff.  He is able to answer simple questions and denies headache, dyspnea, chest pain, abdominal pain, but states he is hungry.  ED Course: Initial vital signs temperature 98 F, pulse 60, respirations 20, BP 190/73 mmHg and O2 sat 99% on room air.  The case was discussed with on-call nephrology by Dr. [name].  He was given calcium gluconate, dextrose, 5 units of NovoLog IV, 10 g of Lokelma p.o. and a 500 mL NS bolus.  Urinalysis showed a cloudy appearance with glucosuria 50 and proteinuria 100 mg/dL.  There was small hemoglobinuria, large leukocyte esterase, more than 50 WBC per hpf with rare bacteria.  CBC shows a white count of 7.6, hemoglobin 11.1 g/dL platelets 262.CMP shows a potassium of 6.1 mmol/L.  All other electrolytes are within normal range when calcium is corrected to albumin.  LFTs are normal.  Glucose 108, BUN 85, creatinine 4.05 mg/dL.  Baseline creatinine is usually under 3.0 mg/dL.  His chest radiograph did not show any acute cardiopulmonary  pathology.   Review of Systems: As per HPI otherwise all other systems reviewed and are negative.  Past Medical History:  Diagnosis Date  . Alcohol dependence ([place])   . Altered mental status   . Anorexia   . Carotid artery stenosis   . Cataract   . CHF (congestive heart failure) ([place])   . Chronic diastolic heart failure ([place])   . Chronic kidney disease, stage IV (severe) ([place])   . Cognitive deficits   . CVA (cerebral infarction) 11/2010   CT 8/12 : Sm ischemic infarct posterior limb of L internal capsule. Carotids nl. ECHO nl with EF 55-60%. R sided weakness.   . Dementia ([place])   . Dementia ([place])   . Dysphagia   . Failure to thrive in adult   . GI hemorrhage   . Hyperlipemia   . Hyperlipidemia 01/06/2011   Diagnosed during acute CVA hospitalization. On statin.  Marland Kitchen Hypertension 11/2010   Diagnosed during acute CVA hospitalization  . Metabolic encephalopathy   . Occlusion of anterior cerebral artery   . Peripheral vascular disease ([place])   . Polysubstance abuse ([place]) 01/06/2011   cocaine, tobacco, THC  . Psychotic disorder ([place])   . PVD (peripheral vascular disease) ([place])   . Right hemiplegia ([place])   . Schizoaffective disorder ([place])   . Stroke [facility])     Past Surgical History:  Procedure Laterality Date  . ESOPHAGOGASTRODUODENOSCOPY (EGD) WITH PROPOFOL N/A 06/25/2017   Dr. Oneida Alar: small hiatal hernia, gastritis/duodenititis. benign biopsy without H.pylori.     Social History  reports that he has been smoking cigarettes. He started smoking about 54  years ago. He has a 20.00 pack-year smoking history. He has never used smokeless tobacco. He reports current alcohol use of about 2.0 standard drinks of alcohol per week. He reports previous drug use. Drug: Cocaine.  Allergies  Allergen Reactions  . Pork-Derived Products Other (See Comments)    Patient does not eat pork products.    Family History  Problem Relation Age of Onset  . Hypertension Mother   . Hypertension Father     Prior to Admission medications   Medication Sig Start Date End Date Taking? Authorizing Provider  amLODipine (NORVASC) 10 MG tablet Take 1 tablet (10 mg total) by mouth daily. 06/12/14   Funches, Adriana Mccallum, MD  atorvastatin (LIPITOR) 40 MG tablet Take 1 tablet (40 mg total) by mouth daily at 6 PM. 06/02/17   Patrecia Pour, Christean Grief, MD  cloNIDine (CATAPRES - DOSED IN MG/24 HR) 0.2 mg/24hr patch Place 0.2 mg onto the skin once a week.    [provider]  cloNIDine (CATAPRES) 0.2 MG tablet Take 1 tablet (0.2 mg total) by mouth 2 (two) times daily. Patient taking differently: Take 0.1 mg by mouth 2 (two) times daily.  06/02/17   Doreatha Lew, MD  clopidogrel (PLAVIX) 75 MG tablet Take 1 tablet (75 mg total) by mouth daily. 06/03/17   Doreatha Lew, MD  dexamethasone (DECADRON) 4 MG tablet Take 4 mg by mouth 2 (two) times daily with a meal.    [provider]  divalproex (DEPAKOTE) 250 MG DR tablet Take 250 mg by mouth daily.    [provider]  hydrALAZINE (APRESOLINE) 100 MG tablet Take 100 mg by mouth 3 (three) times daily.    [provider]  OLANZapine (ZYPREXA) 5 MG tablet Take 5 mg by mouth at bedtime.    [provider]  pantoprazole (PROTONIX) 40 MG tablet Take 1 tablet (40 mg total) by mouth 2 (two) times daily before a meal. 06/25/17 06/25/18  Kathie Dike, MD  sertraline (ZOLOFT) 25 MG tablet Take 25 mg by mouth daily.     [provider]  vitamin C (ASCORBIC ACID) 250 MG tablet Take 250 mg by mouth daily.    [provider]    Physical Exam: Vitals:   08/28/19 2030 08/28/19 2100 08/28/19 2201 08/28/19 2305  BP: (!) 191/66 (!) 202/68 (!) 214/81 (!) 194/66  Pulse:   74 62  Resp: 16 10 16 20   Temp:   98 F (36.7 C) 98.9 F (37.2 C)  TempSrc:   Oral Oral  SpO2:   100% 100%  Weight:      Height:        Constitutional: NAD, calm, comfortable Eyes: PERRL, lids and conjunctivae normal ENMT: Mucous membranes are  mildly dry. Posterior pharynx clear of any exudate or lesions.  Neck: normal, supple, no masses, no thyromegaly Respiratory: clear to auscultation bilaterally, no wheezing, no crackles. Normal respiratory effort. No accessory muscle use.  Cardiovascular: Regular rate and rhythm, no murmurs / rubs / gallops. No extremity edema. 2+ pedal pulses. No carotid bruits.  Abdomen: Nondistended.  BS +. Soft, no tenderness, no masses palpated. No hepatosplenomegaly. .  Musculoskeletal: no clubbing / cyanosis. Good ROM, no contractures. Normal muscle tone.  Skin: no rashes, lesions, ulcers on very limited dermatological examination. Neurologic: CN 2-12 grossly intact.  Right-sided hemiparesis. Psychiatric: Normal judgment and insight. Alert and oriented x 3. Normal mood.   Labs on Admission: I have personally reviewed following labs and imaging studies  CBC: Recent  Labs  Lab 08/28/19 1935  WBC 7.6  NEUTROABS 5.0  HGB 11.1*  HCT 36.7*  MCV 87.4  PLT 101    Basic Metabolic Panel: Recent Labs  Lab 08/28/19 1935  NA 141  K 6.1*  CL 105  CO2 27  GLUCOSE 108*  BUN 85*  CREATININE 4.05*  CALCIUM 8.8*  MG 3.0*    GFR: Estimated Creatinine Clearance: 13.2 mL/min (A) (by C-G formula based on SCr of 4.05 mg/dL (H)).  Liver Function Tests: Recent Labs  Lab 08/28/19 1935  AST 15  ALT 12  ALKPHOS 79  BILITOT 0.5  PROT 7.8  ALBUMIN 3.7    Urine analysis:    Component Value Date/Time   COLORURINE YELLOW 08/28/2019 1847   APPEARANCEUR CLOUDY (A) 08/28/2019 1847   LABSPEC 1.011 08/28/2019 1847   PHURINE 7.0 08/28/2019 1847   GLUCOSEU 50 (A) 08/28/2019 1847   HGBUR SMALL (A) 08/28/2019 1847   BILIRUBINUR NEGATIVE 08/28/2019 1847   KETONESUR NEGATIVE 08/28/2019 1847   PROTEINUR 100 (A) 08/28/2019 1847   UROBILINOGEN 0.2 12/09/2010 1117   NITRITE NEGATIVE 08/28/2019 1847   LEUKOCYTESUR LARGE (A) 08/28/2019 1847    Radiological Exams on Admission: DG Chest Portable 1  View  Result Date: 08/28/2019 CLINICAL DATA:  Fever EXAM: PORTABLE CHEST 1 VIEW COMPARISON:  June 24, 2017 FINDINGS: The cardiomediastinal silhouette is enlarged. Aortic calcifications are again noted. The lungs appear essentially clear. There is no pneumothorax or focal infiltrate. No significant pleural effusion. IMPRESSION: No active disease. Electronically Signed   By: Constance Holster M.D.   On: 08/28/2019 20:19   05/25/2017 echocardiogram  Indications:   Abnormal EKG 794.31.   -------------------------------------------------------------------  History:  PMH:  Stroke. Risk factors: Polysubstance Abuse.  Hypertension. Dyslipidemia.   -------------------------------------------------------------------  Study Conclusions   - Left ventricle: The cavity size was normal. Wall thickness was  increased in a pattern of moderate LVH. Systolic function was  normal. The estimated ejection fraction was in the range of 55%  to 60%. Wall motion was normal; there were no regional wall  motion abnormalities. Features are consistent with a pseudonormal  left ventricular filling pattern, with concomitant abnormal  relaxation and increased filling pressure (grade 2 diastolic  dysfunction).  - Aortic valve: Trileaflet; mildly thickened, mildly calcified  leaflets. There was mild regurgitation.  - Mitral valve: Calcified annulus. There was mild regurgitation.  - Left atrium: The atrium was moderately dilated. Volume/bsa, ES,  (1-plane Simpson&'s, A2C): 44.4 ml/m^2.  - Tricuspid valve: There was mild regurgitation.  - Pulmonary arteries: Systolic pressure was mildly increased.   EKG: Independently reviewed.  Vent. rate 57 BPM PR interval * ms QRS duration 103 ms QT/QTc 446/435 ms P-R-T axes 67 48 65 Sinus rhythm Left ventricular hypertrophy Not significantly changed from last tracing.  Assessment/Plan Principal Problem:   AKI (acute kidney injury) ([place])   CKD  (chronic kidney disease) stage 4, GFR 15-29 ml/min (HCC) Observation/telemetry. Received a 500 mL NS bolus in the ED. Currently on dextrose 10% for hypoglycemia. Monitor intake and output. Follow renal function electrolytes. Nephrology will consult in a.m.  Active Problems:   Hyperkalemia Treated in ED. The patient developed hypoglycemia afterwards.    Acute lower UTI Ceftriaxone 1 g IVPB every 12 hours. Follow urine culture and sensitivity.    Hypoglycemia Was fasting since this morning. Given something to eat by the staff Continue dextrose 10% infusion. Follow-up CBG monitoring.    Hypertensive urgency Continue amlodipine 10 mg p.o. daily. Obtain MAR  from [place] to check antihypertensives. Monitor blood pressure. Parenteral antihypertensive as needed.    Hyperlipidemia with target LDL less than 100 Continue atorvastatin once med rec performed.    Normocytic anemia Check anemia panel. Monitor hematocrit and hemoglobin.   DVT prophylaxis: SCDs. Code Status:   Full code. Family Communication:   Disposition Plan:   Patient is from:  Home.  Anticipated DC to:  Home.  Anticipated DC date:  08/29/2019 or 08/30/2019.  Anticipated DC barriers: Clinical improvement  Consults called:  Nephrology Izell , MD) Admission status:  Observation/telemetry.   Severity of Illness: Medium severity.  Reubin Milan MD [facility]  How to contact the [facility] Attending or Consulting provider [place] or covering provider during after hours [place], for this patient?   1. Check the care team in [facility] and look for a) attending/consulting TRH provider listed and b) the [facility] team listed 2. Log into [email or web address] and use [place]'s universal password to access. If you do not have the password, please contact the hospital operator. 3. Locate the [facility] provider you are looking for under [facility] and page to a number that you can be directly reached. 4. If you  still have difficulty reaching the provider, please page the [facility] (Director on Call) for the Hospitalists listed on amion for assistance.  08/28/2019, 11:22 PM   This document was prepared using Dragon voice recognition software and may contain some unintended transcription errors

## 2019-08-28 NOTE — ED Provider Notes (Signed)
Emergency Department Provider Note   I have reviewed the triage vital signs and the nursing notes.   HISTORY  Chief Complaint Fever and abnormal lab   HPI Lucas [name] is a 69 y.o. male with PMH reviewed below including dementia, CKD, and CHF presents to the emergency department from his skilled nursing facility with worsening creatinine, hyperkalemia, and report of fever.  Staff unable to tell me the exact temperature or other associated symptoms.  Patient tells me that he is feeling well and not having pain. Reports that he continues to urinate and is unsure regarding fever.   Level 5 caveat: Prior CVA and history of dementia.   Past Medical History:  Diagnosis Date  . Alcohol dependence ([place])   . Altered mental status   . Anorexia   . Carotid artery stenosis   . Cataract   . CHF (congestive heart failure) ([place])   . Chronic diastolic heart failure ([place])   . Chronic kidney disease, stage IV (severe) ([place])   . Cognitive deficits   . CVA (cerebral infarction) 11/2010   CT 8/12 : Sm ischemic infarct posterior limb of L internal capsule. Carotids nl. ECHO nl with EF 55-60%. R sided weakness.   . Dementia ([place])   . Dementia ([place])   . Dysphagia   . Failure to thrive in adult   . GI hemorrhage   . Hyperlipemia   . Hyperlipidemia 01/06/2011   Diagnosed during acute CVA hospitalization. On statin.  Marland Kitchen Hypertension 11/2010   Diagnosed during acute CVA hospitalization  . Metabolic encephalopathy   . Occlusion of anterior cerebral artery   . Peripheral vascular disease ([place])   . Polysubstance abuse ([place]) 01/06/2011   cocaine, tobacco, THC  . Psychotic disorder ([place])   . PVD (peripheral vascular disease) ([place])   . Right hemiplegia ([place])   . Schizoaffective disorder ([place])   . Stroke [facility])     Patient Active Problem List   Diagnosis Date Noted  . Anemia 11/03/2017  . UGI bleed 06/24/2017  . E coli bacteremia 06/17/2017  . Altered mental status 06/15/2017  . Pressure  injury of skin 06/15/2017  . AKI (acute kidney injury) ([place]) 06/15/2017  . CKD (chronic kidney disease) stage 4, GFR 15-29 ml/min (HCC) 06/15/2017  . History of stroke 06/15/2017  . Acute lower UTI 06/15/2017  . Dehydration 06/15/2017  . Cocaine abuse ([place])   . Noncompliance w/medication treatment due to intermit use of medication   . Middle cerebral artery stenosis, right   . Stenosis of right carotid artery   . Cerebrovascular accident (CVA) ([place])   . 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 Smoker 06/12/2014  . Memory loss or impairment 02/13/2013  . Right shoulder pain 02/21/2011  . HTN (hypertension) 01/06/2011  . Hyperlipidemia with target LDL less than 100 01/06/2011  . Polysubstance abuse ([place]) 01/06/2011  . CVA (cerebral infarction) 01/06/2011  . Preventative health care 01/06/2011    Past Surgical History:  Procedure Laterality Date  . ESOPHAGOGASTRODUODENOSCOPY (EGD) WITH PROPOFOL N/A 06/25/2017   Dr. Oneida Alar: small hiatal hernia, gastritis/duodenititis. benign biopsy without H.pylori.     Allergies Pork-derived products  Family History  Problem Relation Age of Onset  . Hypertension Mother   . Hypertension Father     Social History Social History   Tobacco Use  . Smoking status: Current Some Day Smoker    Packs/day: 0.50    Years: 40.00    Pack years: 20.00    Types: Cigarettes  Start date: 01/02/1965  . Smokeless tobacco: Never Used  . Tobacco comment: 2-3 per week  Substance Use Topics  . Alcohol use: Yes    Alcohol/week: 2.0 standard drinks    Types: 2 Standard drinks or equivalent per week    Comment: beer  . Drug use: Not Currently    Types: Cocaine    Review of Systems  Constitutional: No fever/chills Eyes: No visual changes. ENT: No sore throat. Cardiovascular: Denies chest pain. Respiratory: Denies shortness of breath. Gastrointestinal: No abdominal pain.  No nausea, no vomiting.  No diarrhea.  No constipation. Genitourinary: Negative  for dysuria. Musculoskeletal: Negative for back pain. Skin: Negative for rash. Neurological: Negative for headaches, focal weakness or numbness.  10-point ROS otherwise negative.  ____________________________________________   PHYSICAL EXAM:  VITAL SIGNS: ED Triage Vitals  Enc Vitals Group     BP 08/28/19 1822 (!) 198/73     Pulse Rate 08/28/19 1822 60     Resp --      Temp 08/28/19 1822 98 F (36.7 C)     Temp Source 08/28/19 1822 Oral     SpO2 08/28/19 1822 99 %     Weight 08/28/19 1815 115 lb (52.2 kg)     Height 08/28/19 1815 5\' 5"  (1.651 m)   Constitutional: Alert. Well appearing and in no acute distress. Eyes: Conjunctivae are normal.  Head: Atraumatic. Nose: No congestion/rhinnorhea. Mouth/Throat: Mucous membranes are moist.   Neck: No stridor.   Cardiovascular: Normal rate, regular rhythm. Good peripheral circulation. Grossly normal heart sounds.   Respiratory: Normal respiratory effort.  No retractions. Lungs CTAB. Gastrointestinal: Soft and nontender. No distention.  Musculoskeletal: Contracted bilateral LEs.  Neurologic:  Normal speech and language.  Skin:  Skin is warm, dry and intact. No rash noted.  ____________________________________________   LABS (all labs ordered are listed, but only abnormal results are displayed)  Labs Reviewed  COMPREHENSIVE METABOLIC PANEL - Abnormal; Notable for the following components:      Result Value   Potassium 6.1 (*)    Glucose, Bld 108 (*)    BUN 85 (*)    Creatinine, Ser 4.05 (*)    Calcium 8.8 (*)    GFR calc non Af Amer 14 (*)    GFR calc Af Amer 17 (*)    All other components within normal limits  CBC WITH DIFFERENTIAL/PLATELET - Abnormal; Notable for the following components:   RBC 4.20 (*)    Hemoglobin 11.1 (*)    HCT 36.7 (*)    RDW 16.5 (*)    All other components within normal limits  URINALYSIS, ROUTINE W REFLEX MICROSCOPIC - Abnormal; Notable for the following components:   APPearance CLOUDY (*)     Glucose, UA 50 (*)    Hgb urine dipstick SMALL (*)    Protein, ur 100 (*)    Leukocytes,Ua LARGE (*)    WBC, UA >50 (*)    Bacteria, UA RARE (*)    All other components within normal limits  MAGNESIUM - Abnormal; Notable for the following components:   Magnesium 3.0 (*)    All other components within normal limits  CULTURE, BLOOD (ROUTINE X 2)  CULTURE, BLOOD (ROUTINE X 2)  SARS CORONAVIRUS 2 BY RT PCR (HOSPITAL ORDER, [place] [facility])  URINE CULTURE  HIV ANTIBODY (ROUTINE TESTING W REFLEX)  CBG MONITORING, ED  CBG MONITORING, ED  CBG MONITORING, ED   ____________________________________________  EKG   EKG Interpretation  Date/Time:  Monday  Aug 28 2019 18:45:17 EDT Ventricular Rate:  57 PR Interval:    QRS Duration: 103 QT Interval:  446 QTC Calculation: 435 R Axis:   48 Text Interpretation: Sinus rhythm Left ventricular hypertrophy Similar to Jan 2020 tracing No STEMI Confirmed by Nanda Quinton [phone]) on 08/28/2019 6:49:24 PM       ____________________________________________  RADIOLOGY  DG Chest Portable 1 View  Result Date: 08/28/2019 CLINICAL DATA:  Fever EXAM: PORTABLE CHEST 1 VIEW COMPARISON:  June 24, 2017 FINDINGS: The cardiomediastinal silhouette is enlarged. Aortic calcifications are again noted. The lungs appear essentially clear. There is no pneumothorax or focal infiltrate. No significant pleural effusion. IMPRESSION: No active disease. Electronically Signed   By: Constance Holster M.D.   On: 08/28/2019 20:19    ____________________________________________   PROCEDURES  Procedure(s) performed:   Procedures  CRITICAL CARE Performed by: Margette Fast Total critical care time: 35 minutes Critical care time was exclusive of separately billable procedures and treating other patients. Critical care was necessary to treat or prevent imminent or life-threatening deterioration. Critical care was time spent personally by me on  the following activities: development of treatment plan with patient and/or surrogate as well as nursing, discussions with consultants, evaluation of patient's response to treatment, examination of patient, obtaining history from patient or surrogate, ordering and performing treatments and interventions, ordering and review of laboratory studies, ordering and review of radiographic studies, pulse oximetry and re-evaluation of patient's condition.  Nanda Quinton, MD Emergency Medicine  ____________________________________________   INITIAL IMPRESSION / ASSESSMENT AND PLAN / ED COURSE  Pertinent labs & imaging results that were available during my care of the patient were reviewed by me and considered in my medical decision making (see chart for details).   Patient presents to the emergency department with report of worsening renal function, hyperkalemia, fever.  No clear source of infection at this time on my exam but will obtain blood cultures along with lab work, UA, chest x-ray, Covid test.  Patient is not vaccinated according to him.  Will obtain bladder scan as well.   Patient's labs here showing creatinine of 4.05 with elevated BUN and potassium of 6.1.  EKG interpreted as above showing slight prominence of T waves but otherwise normal intervals.  Have ordered Lokelma, calcium, insulin with D50 along with frequent CBG checks.  Spoke with Dr. Joelyn Oms who agrees with plan and will consult on the patient tomorrow.  He is okay with patient staying here at [facility]. No clear fever source here. No evidence of urinary retention to suspect obstructive cause of AKI. No fever source. Cultures sent. COVID negative.   Discussed patient's case with TRH to request admission. Patient and family (if present) updated with plan. Care transferred to [facility] service.  I reviewed all nursing notes, vitals, pertinent old records, EKGs, labs, imaging (as  available).  ____________________________________________  FINAL CLINICAL IMPRESSION(S) / ED DIAGNOSES  Final diagnoses:  AKI (acute kidney injury) ([place])  Hyperkalemia     MEDICATIONS GIVEN DURING THIS VISIT:  Medications  sodium zirconium cyclosilicate (LOKELMA) packet 10 g (10 g Oral Given 08/28/19 2143)  acetaminophen (TYLENOL) tablet 650 mg (has no administration in time range)    Or  acetaminophen (TYLENOL) suppository 650 mg (has no administration in time range)  ondansetron (ZOFRAN) tablet 4 mg (has no administration in time range)    Or  ondansetron (ZOFRAN) injection 4 mg (has no administration in time range)  0.45 % sodium chloride infusion (has no administration in  time range)  hydrALAZINE (APRESOLINE) injection 20 mg (has no administration in time range)  cefTRIAXone (ROCEPHIN) 1 g in sodium chloride 0.9 % 100 mL IVPB (has no administration in time range)  calcium gluconate inj 10% (1 g) URGENT USE ONLY! (1 g Intravenous Given 08/28/19 2152)  insulin aspart (novoLOG) injection 5 Units (5 Units Intravenous Given 08/28/19 2150)  dextrose 50 % solution 50 mL (50 mLs Intravenous Given 08/28/19 2146)  sodium chloride 0.9 % bolus 500 mL (500 mLs Intravenous New Bag/Given 08/28/19 2157)    Note:  This document was prepared using Dragon voice recognition software and may include unintentional dictation errors.  Nanda Quinton, MD, [facility] Emergency Medicine   Lasean Rahming, [place], MD 08/28/19 [phone]

## 2019-08-29 ENCOUNTER — Observation Stay (HOSPITAL_COMMUNITY): Payer: Medicare Other

## 2019-08-29 DIAGNOSIS — M25561 Pain in right knee: Secondary | ICD-10-CM | POA: Diagnosis not present

## 2019-08-29 DIAGNOSIS — M79673 Pain in unspecified foot: Secondary | ICD-10-CM | POA: Diagnosis not present

## 2019-08-29 DIAGNOSIS — I6601 Occlusion and stenosis of right middle cerebral artery: Secondary | ICD-10-CM | POA: Diagnosis not present

## 2019-08-29 DIAGNOSIS — N170 Acute kidney failure with tubular necrosis: Secondary | ICD-10-CM | POA: Diagnosis present

## 2019-08-29 DIAGNOSIS — R509 Fever, unspecified: Secondary | ICD-10-CM | POA: Diagnosis not present

## 2019-08-29 DIAGNOSIS — E785 Hyperlipidemia, unspecified: Secondary | ICD-10-CM | POA: Diagnosis present

## 2019-08-29 DIAGNOSIS — R627 Adult failure to thrive: Secondary | ICD-10-CM | POA: Diagnosis present

## 2019-08-29 DIAGNOSIS — I10 Essential (primary) hypertension: Secondary | ICD-10-CM

## 2019-08-29 DIAGNOSIS — R630 Anorexia: Secondary | ICD-10-CM | POA: Diagnosis present

## 2019-08-29 DIAGNOSIS — N179 Acute kidney failure, unspecified: Secondary | ICD-10-CM | POA: Diagnosis not present

## 2019-08-29 DIAGNOSIS — E162 Hypoglycemia, unspecified: Secondary | ICD-10-CM | POA: Diagnosis not present

## 2019-08-29 DIAGNOSIS — I160 Hypertensive urgency: Secondary | ICD-10-CM | POA: Diagnosis present

## 2019-08-29 DIAGNOSIS — R7881 Bacteremia: Secondary | ICD-10-CM | POA: Diagnosis present

## 2019-08-29 DIAGNOSIS — Z91018 Allergy to other foods: Secondary | ICD-10-CM | POA: Diagnosis not present

## 2019-08-29 DIAGNOSIS — G9340 Encephalopathy, unspecified: Secondary | ICD-10-CM | POA: Diagnosis not present

## 2019-08-29 DIAGNOSIS — R5381 Other malaise: Secondary | ICD-10-CM | POA: Diagnosis not present

## 2019-08-29 DIAGNOSIS — R823 Hemoglobinuria: Secondary | ICD-10-CM | POA: Diagnosis present

## 2019-08-29 DIAGNOSIS — D649 Anemia, unspecified: Secondary | ICD-10-CM | POA: Diagnosis not present

## 2019-08-29 DIAGNOSIS — L89890 Pressure ulcer of other site, unstageable: Secondary | ICD-10-CM | POA: Diagnosis not present

## 2019-08-29 DIAGNOSIS — E875 Hyperkalemia: Secondary | ICD-10-CM | POA: Diagnosis present

## 2019-08-29 DIAGNOSIS — I69391 Dysphagia following cerebral infarction: Secondary | ICD-10-CM | POA: Diagnosis not present

## 2019-08-29 DIAGNOSIS — F0150 Vascular dementia without behavioral disturbance: Secondary | ICD-10-CM | POA: Diagnosis present

## 2019-08-29 DIAGNOSIS — F331 Major depressive disorder, recurrent, moderate: Secondary | ICD-10-CM | POA: Diagnosis not present

## 2019-08-29 DIAGNOSIS — R279 Unspecified lack of coordination: Secondary | ICD-10-CM | POA: Diagnosis not present

## 2019-08-29 DIAGNOSIS — D631 Anemia in chronic kidney disease: Secondary | ICD-10-CM | POA: Diagnosis present

## 2019-08-29 DIAGNOSIS — N189 Chronic kidney disease, unspecified: Secondary | ICD-10-CM | POA: Diagnosis present

## 2019-08-29 DIAGNOSIS — Z20822 Contact with and (suspected) exposure to covid-19: Secondary | ICD-10-CM | POA: Diagnosis present

## 2019-08-29 DIAGNOSIS — F259 Schizoaffective disorder, unspecified: Secondary | ICD-10-CM | POA: Diagnosis present

## 2019-08-29 DIAGNOSIS — I6529 Occlusion and stenosis of unspecified carotid artery: Secondary | ICD-10-CM | POA: Diagnosis not present

## 2019-08-29 DIAGNOSIS — F39 Unspecified mood [affective] disorder: Secondary | ICD-10-CM | POA: Diagnosis not present

## 2019-08-29 DIAGNOSIS — Z66 Do not resuscitate: Secondary | ICD-10-CM | POA: Diagnosis present

## 2019-08-29 DIAGNOSIS — N184 Chronic kidney disease, stage 4 (severe): Secondary | ICD-10-CM

## 2019-08-29 DIAGNOSIS — I69351 Hemiplegia and hemiparesis following cerebral infarction affecting right dominant side: Secondary | ICD-10-CM | POA: Diagnosis not present

## 2019-08-29 DIAGNOSIS — N390 Urinary tract infection, site not specified: Secondary | ICD-10-CM

## 2019-08-29 DIAGNOSIS — F062 Psychotic disorder with delusions due to known physiological condition: Secondary | ICD-10-CM | POA: Diagnosis not present

## 2019-08-29 DIAGNOSIS — I5032 Chronic diastolic (congestive) heart failure: Secondary | ICD-10-CM | POA: Diagnosis present

## 2019-08-29 DIAGNOSIS — I739 Peripheral vascular disease, unspecified: Secondary | ICD-10-CM | POA: Diagnosis not present

## 2019-08-29 DIAGNOSIS — Z681 Body mass index (BMI) 19 or less, adult: Secondary | ICD-10-CM | POA: Diagnosis not present

## 2019-08-29 DIAGNOSIS — M25559 Pain in unspecified hip: Secondary | ICD-10-CM | POA: Diagnosis not present

## 2019-08-29 DIAGNOSIS — G9341 Metabolic encephalopathy: Secondary | ICD-10-CM | POA: Diagnosis present

## 2019-08-29 DIAGNOSIS — E440 Moderate protein-calorie malnutrition: Secondary | ICD-10-CM | POA: Diagnosis present

## 2019-08-29 DIAGNOSIS — R1310 Dysphagia, unspecified: Secondary | ICD-10-CM | POA: Diagnosis present

## 2019-08-29 DIAGNOSIS — E869 Volume depletion, unspecified: Secondary | ICD-10-CM | POA: Diagnosis present

## 2019-08-29 DIAGNOSIS — I130 Hypertensive heart and chronic kidney disease with heart failure and stage 1 through stage 4 chronic kidney disease, or unspecified chronic kidney disease: Secondary | ICD-10-CM | POA: Diagnosis not present

## 2019-08-29 DIAGNOSIS — F0281 Dementia in other diseases classified elsewhere with behavioral disturbance: Secondary | ICD-10-CM | POA: Diagnosis not present

## 2019-08-29 DIAGNOSIS — Z7401 Bed confinement status: Secondary | ICD-10-CM | POA: Diagnosis not present

## 2019-08-29 LAB — BLOOD CULTURE ID PANEL (REFLEXED)

## 2019-08-29 LAB — BASIC METABOLIC PANEL
Anion gap: 10 (ref 5–15)
BUN: 72 mg/dL — ABNORMAL HIGH (ref 8–23)
CO2: 23 mmol/L (ref 22–32)
Calcium: 8.4 mg/dL — ABNORMAL LOW (ref 8.9–10.3)
Chloride: 104 mmol/L (ref 98–111)
Creatinine, Ser: 3.66 mg/dL — ABNORMAL HIGH (ref 0.61–1.24)
GFR calc Af Amer: 19 mL/min — ABNORMAL LOW (ref >60)
GFR calc non Af Amer: 16 mL/min — ABNORMAL LOW (ref >60)
Glucose, Bld: 110 mg/dL — ABNORMAL HIGH (ref 70–99)
Potassium: 5 mmol/L (ref 3.5–5.1)
Sodium: 137 mmol/L (ref 135–145)

## 2019-08-29 LAB — VITAMIN B12: Vitamin B-12: 764 pg/mL (ref 180–914)

## 2019-08-29 LAB — RENAL FUNCTION PANEL
Albumin: 3 g/dL — ABNORMAL LOW (ref 3.5–5.0)
Anion gap: 11 (ref 5–15)
BUN: 79 mg/dL — ABNORMAL HIGH (ref 8–23)
CO2: 24 mmol/L (ref 22–32)
Calcium: 8.6 mg/dL — ABNORMAL LOW (ref 8.9–10.3)
Chloride: 105 mmol/L (ref 98–111)
Creatinine, Ser: 3.51 mg/dL — ABNORMAL HIGH (ref 0.61–1.24)
GFR calc Af Amer: 20 mL/min — ABNORMAL LOW (ref >60)
GFR calc non Af Amer: 17 mL/min — ABNORMAL LOW (ref >60)
Glucose, Bld: 102 mg/dL — ABNORMAL HIGH (ref 70–99)
Phosphorus: 4.6 mg/dL (ref 2.5–4.6)
Potassium: 5.5 mmol/L — ABNORMAL HIGH (ref 3.5–5.1)
Sodium: 140 mmol/L (ref 135–145)

## 2019-08-29 LAB — IRON AND TIBC
Iron: 57 ug/dL (ref 45–182)
Saturation Ratios: 30 % (ref 17.9–39.5)
TIBC: 190 ug/dL — ABNORMAL LOW (ref 250–450)
UIBC: 133 ug/dL

## 2019-08-29 LAB — GLUCOSE, CAPILLARY
Glucose-Capillary: 130 mg/dL — ABNORMAL HIGH (ref 70–99)
Glucose-Capillary: 81 mg/dL (ref 70–99)

## 2019-08-29 LAB — FERRITIN: Ferritin: 782 ng/mL — ABNORMAL HIGH (ref 24–336)

## 2019-08-29 LAB — RETICULOCYTES
Immature Retic Fract: 5.3 % (ref 2.3–15.9)
RBC.: 3.86 MIL/uL — ABNORMAL LOW (ref 4.22–5.81)
Retic Count, Absolute: 22.4 10*3/uL (ref 19.0–186.0)
Retic Ct Pct: 0.6 % (ref 0.4–3.1)

## 2019-08-29 LAB — HIV ANTIBODY (ROUTINE TESTING W REFLEX): HIV Screen 4th Generation wRfx: NONREACTIVE

## 2019-08-29 LAB — FOLATE: Folate: 15.2 ng/mL (ref >5.9)

## 2019-08-29 MED ORDER — SODIUM CHLORIDE 0.9 % IV SOLN: INTRAVENOUS | Status: DC

## 2019-08-29 MED ORDER — METOPROLOL TARTRATE 5 MG/5ML IV SOLN
5.0000 mg | Freq: Once | INTRAVENOUS | Status: AC
  Administered 2019-08-29: 5 mg via INTRAVENOUS
  Filled 2019-08-29: qty 5

## 2019-08-29 MED ORDER — SODIUM ZIRCONIUM CYCLOSILICATE 10 G PO PACK
10.0000 g | PACK | Freq: Two times a day (BID) | ORAL | Status: DC
  Administered 2019-08-29 – 2019-08-31 (×5): 10 g via ORAL
  Filled 2019-08-29 (×4): qty 1

## 2019-08-29 NOTE — Progress Notes (Signed)
PROGRESS NOTE    Lucas [name]  EGB:[id number] DOB: [birth year] DOA: 08/28/2019 PCP: [name], MD  Chief Complaint  Patient presents with  . Fever  . abnormal lab    Brief Narrative:  As per H&P written by Dr. Olevia Bowens on 08/28/2019 69 y.o. male with medical history significant of dependence, history of AMS, anorexia, carotid artery stenosis, cataracts, chronic diastolic heart failure, stage IV chronic kidney disease, history of ischemic CVA, dementia, dysphagia, history of failure to thrive, history of GI bleed, hyperlipidemia, hypertension, history of metabolic encephalopathy, pro vascular disease, cocaine use, schizoaffective disorder who is brought to the emergency department from [facility] due to abnormal blood test and fever over the weekend.  He is unable to provide much history and we were unable to obtain further history from the nursing home staff.  He is able to answer simple questions and denies headache, dyspnea, chest pain, abdominal pain, but states he is hungry.  ED Course: Initial vital signs temperature 98 F, pulse 60, respirations 20, BP 190/73 mmHg and O2 sat 99% on room air.  The case was discussed with on-call nephrology by Dr. [name].  He was given calcium gluconate, dextrose, 5 units of NovoLog IV, 10 g of Lokelma p.o. and a 500 mL NS bolus.  Urinalysis showed a cloudy appearance with glucosuria 50 and proteinuria 100 mg/dL.  There was small hemoglobinuria, large leukocyte esterase, more than 50 WBC per hpf with rare bacteria.  CBC shows a white count of 7.6, hemoglobin 11.1 g/dL platelets 262.CMP shows a potassium of 6.1 mmol/L.  All other electrolytes are within normal range when calcium is corrected to albumin.  LFTs are normal.  Glucose 108, BUN 85, creatinine 4.05 mg/dL.  Baseline creatinine is usually under 3.0 mg/dL.  His chest radiograph did not show any acute cardiopulmonary pathology.        Assessment & Plan: 1-acute encephalopathy -Appears to be  metabolic in nature in the setting of worsening renal function, mild anemia and acute UTI. -Continue antibiotics -Continue IV fluids -Follow renal function trend -Follow further recommendations by nephrology service. -Minimize the use of sedative medications and follow clinical response.    AKI (acute kidney injury) on chronic kidney disease a stage IV -Appears to be multifactorial in the setting of continued use of nephrotoxic agents prior to admission, prerenal azotemia, UTI and further progression of underlying renal failure. -Nephrology service is on board and will follow recommendation -Checking renal ultrasound to look for obstructive uropathy -Continue IV fluids -Follow renal function electrolytes trend.  HTN (hypertension) -Stable overall -Continue following vital signs and adjust antihypertensive regimen as required.  Hyperlipidemia with target LDL less than 100 -Continue statins.  Acute lower UTI -Continue Rocephin -Follow urine culture results.  Normocytic anemia -In the setting of chronic kidney disease -Hemoglobin appears stable -No signs of operative bleeding -Continue to follow trend.   DVT prophylaxis: SCDs Code Status: Full code. Family Communication: No family at bedside and unable to be reached over the phone. Disposition:   Status is: Inpatient  Dispo: The patient is from: SNF              Anticipated d/c is : Nursing facility.              Anticipated d/c date is: To be determined              Patient currently no medically stable in the setting of ongoing encephalopathy, worsening renal function requiring IV antibiotics while waiting  on culture sensitivity and speciation.     Consultants:   Nephrology service (Dr. Arty Baumgartner).   Procedures:  See below for x-ray reports  Antimicrobials:  Rocephin (08/28/19)  Subjective: Afebrile, no chest pain, no nausea, no vomiting.  Continues to be disoriented to everything except person; in no major  distress.  Decreasing urine output appreciated.  Objective: Vitals:   08/29/19 0020 08/29/19 0314 08/29/19 0631 08/29/19 1543  BP: (!) 176/65 (!) 141/76 (!) 167/69 (!) 167/82  Pulse: 67 62 (!) 59 69  Resp:  20 20 18   Temp: 99 F (37.2 C) 99.3 F (37.4 C) 99.3 F (37.4 C) 97.8 F (36.6 C)  TempSrc: Oral Oral Oral   SpO2: 100% 96% 97% 100%  Weight:      Height:        Intake/Output Summary (Last 24 hours) at 08/29/2019 1624 Last data filed at 08/29/2019 0641 Gross per 24 hour  Intake 370.64 ml  Output 500 ml  Net -129.36 ml   Filed Weights   08/28/19 1815  Weight: 52.2 kg    Examination: General exam: Appears calm and in no major distress; oriented x1 (unknown baseline).  Afebrile, no chest pain, no shortness of breath, no nausea or vomiting.  Chronically ill in appearance, frail and with dry mucous members on examination. Respiratory system: Clear to auscultation. Respiratory effort normal. Cardiovascular system: S1 & S2 heard, rate controlled, no rubs, no gallops, no murmurs.  No JVD. Gastrointestinal system: Abdomen is nondistended, soft and nontender. No organomegaly or masses felt. Normal bowel sounds heard. Central nervous system: Alert and oriented x1; following commands and with chronic right hemiparesis. Extremities: No cyanosis or clubbing. Skin: No rashes, no petechiae. Psychiatry: Mood & affect appropriate.     Data Reviewed: I have personally reviewed following labs and imaging studies  CBC: Recent Labs  Lab 08/28/19 1935  WBC 7.6  NEUTROABS 5.0  HGB 11.1*  HCT 36.7*  MCV 87.4  PLT 229    Basic Metabolic Panel: Recent Labs  Lab 08/28/19 1935 08/29/19 0806 08/29/19 1513  NA 141 140 137  K 6.1* 5.5* 5.0  CL 105 105 104  CO2 27 24 23   GLUCOSE 108* 102* 110*  BUN 85* 79* 72*  CREATININE 4.05* 3.51* 3.66*  CALCIUM 8.8* 8.6* 8.4*  MG 3.0*  --   --   PHOS  --  4.6  --     GFR: Estimated Creatinine Clearance: 14.7 mL/min (A) (by C-G formula  based on SCr of 3.66 mg/dL (H)).  Liver Function Tests: Recent Labs  Lab 08/28/19 1935 08/29/19 0806  AST 15  --   ALT 12  --   ALKPHOS 79  --   BILITOT 0.5  --   PROT 7.8  --   ALBUMIN 3.7 3.0*    CBG: Recent Labs  Lab 08/28/19 2235 08/28/19 2310 08/28/19 2325 08/29/19 0020 08/29/19 0220  GLUCAP 98 49* 65* 81 130*     Recent Results (from the past 240 hour(s))  SARS Coronavirus 2 by RT PCR (hospital order, performed in [facility] hospital lab) Nasopharyngeal Nasopharyngeal Swab     Status: None   Collection Time: 08/28/19  6:49 PM   Specimen: Nasopharyngeal Swab  Result Value Ref Range Status   SARS Coronavirus 2 NEGATIVE NEGATIVE Final    Comment: (NOTE) SARS-CoV-2 target nucleic acids are NOT DETECTED. The SARS-CoV-2 RNA is generally detectable in upper and lower respiratory specimens during the acute phase of infection. The lowest concentration of SARS-CoV-2  viral copies this assay can detect is 250 copies / mL. A negative result does not preclude SARS-CoV-2 infection and should not be used as the sole basis for treatment or other patient management decisions.  A negative result may occur with improper specimen collection / handling, submission of specimen other than nasopharyngeal swab, presence of viral mutation(s) within the areas targeted by this assay, and inadequate number of viral copies (<250 copies / mL). A negative result must be combined with clinical observations, patient history, and epidemiological information. Fact Sheet for Patients:   StrictlyIdeas.no Fact Sheet for Healthcare Providers: BankingDealers.co.za This test is not yet approved or cleared  by the [place] FDA and has been authorized for detection and/or diagnosis of SARS-CoV-2 by FDA under an Emergency Use Authorization (EUA).  This EUA will remain in effect (meaning this test can be used) for the duration of the COVID-19  declaration under Section 564(b)(1) of the Act, 21 U.S.C. section 360bbb-3(b)(1), unless the authorization is terminated or revoked sooner. Performed at [facility], [address], [place], [place] [address]   Culture, blood (routine x 2)     Status: None (Preliminary result)   Collection Time: 08/28/19  7:35 PM   Specimen: Right Antecubital; Blood  Result Value Ref Range Status   Specimen Description RIGHT ANTECUBITAL  Final   Special Requests   Final    BOTTLES DRAWN AEROBIC AND ANAEROBIC Blood Culture adequate volume   Culture   Final    NO GROWTH < 12 HOURS Performed at [facility], [address], [place], [place] [address]    Report Status PENDING  Incomplete  Culture, blood (routine x 2)     Status: None (Preliminary result)   Collection Time: 08/28/19  7:35 PM   Specimen: Right Antecubital; Blood  Result Value Ref Range Status   Specimen Description RIGHT ANTECUBITAL  Final   Special Requests   Final    BOTTLES DRAWN AEROBIC AND ANAEROBIC Blood Culture adequate volume   Culture   Final    NO GROWTH < 12 HOURS Performed at [facility], [address] [place], [place] [address]    Report Status PENDING  Incomplete     Radiology Studies: US RENAL  Result Date: 08/29/2019 CLINICAL DATA:  Acute renal failure. EXAM: RENAL / URINARY TRACT ULTRASOUND COMPLETE COMPARISON:  None. FINDINGS: Right Kidney: Renal measurements: 10.3 x 4.8 x 5.2 cm = volume: 136 mL. Echogenic parenchyma. No cyst, mass, stone or hydronephrosis. Left Kidney: Left kidney could not be identified by the technologist because of regional intestinal gas. Bladder: Collapsed, bladder catheter in place. Other: None. IMPRESSION: Right kidney is normal size but shows abnormal echogenicity consistent with renal parenchymal disease. No obstruction. Left kidney could not be found by the technologist due to extensive regional bowel gas. Electronically Signed   By: Nelson Chimes M.D.   On: 08/29/2019 12:46   DG Chest  Portable 1 View  Result Date: 08/28/2019 CLINICAL DATA:  Fever EXAM: PORTABLE CHEST 1 VIEW COMPARISON:  June 24, 2017 FINDINGS: The cardiomediastinal silhouette is enlarged. Aortic calcifications are again noted. The lungs appear essentially clear. There is no pneumothorax or focal infiltrate. No significant pleural effusion. IMPRESSION: No active disease. Electronically Signed   By: Constance Holster M.D.   On: 08/28/2019 20:19    Scheduled Meds: . sodium zirconium cyclosilicate  10 g Oral BID   Continuous Infusions: . sodium chloride 75 mL/hr at 08/29/19 1000  . cefTRIAXone (ROCEPHIN)  IV    .  dextrose 50 mL/hr at 08/29/19 0320     LOS: 0 days    Time spent: 30 minutes.    Barton Dubois, MD [facility]   To contact the attending provider between 7A-7P or the covering provider during after hours 7P-7A, please log into the web site [email or web address] and access using universal [place] password for that web site. If you do not have the password, please call the hospital operator.  08/29/2019, 4:24 PM

## 2019-08-29 NOTE — Plan of Care (Signed)

## 2019-08-29 NOTE — Consult Note (Signed)
Reason for Consult: AKI/CKD stage IV Referring Physician: Dyann Kief, MD  Lucas [name] is an 69 y.o. male with an extensive PMH significant for polysubstance abuse, chronic diastolic CHF, vascular dementia, h/o CVA with right hemiparesis, HLD, FTT, HTN, PAD, schizoaffective disorder, and CKD stage IV who was brought from [facility] after having a fever over the weekend and abnormal lab values with hyperkalemia and rising Scr.  In the ED he was noted to be confused/lethargic, hypertensive at 190/73.  Labs were notable for K of 6.1, BUN/Cr 85/4.05.  CXR without active disease.  He was given IVF's and admitted for further workup and we were consulted to further evaluate and manage his AKI/CKD.  The trend in Scr is seen below.  His hyperkalemia was treated in the ED with IV calcium, insulin/D50, lokelma and improved to 5.5 today.  His UA was notable for large leukocytes, +protein/blood/glucose and cultures sent.    He is a poor historian and we do not have any records from his previous hospitalizations or office visits as his care was in [place] in the past and apparently only recently came to [facility].  This HPI was obtained from review of the existing medical records.    Trend in Creatinine: Creatinine, Ser  Date/Time Value Ref Range Status  08/29/2019 08:06 AM 3.51 (H) 0.61 - 1.24 mg/dL Final  08/28/2019 07:35 PM 4.05 (H) 0.61 - 1.24 mg/dL Final  06/25/2017 02:41 AM 2.65 (H) 0.61 - 1.24 mg/dL Final  06/24/2017 10:17 AM 2.95 (H) 0.61 - 1.24 mg/dL Final  06/16/2017 09:51 AM 3.70 (H) 0.61 - 1.24 mg/dL Final  06/15/2017 10:09 AM 4.70 (H) 0.61 - 1.24 mg/dL Final  06/15/2017 09:55 AM 4.49 (H) 0.61 - 1.24 mg/dL Final  05/30/2017 06:27 AM 3.29 (H) 0.61 - 1.24 mg/dL Final  05/28/2017 05:31 AM 3.41 (H) 0.61 - 1.24 mg/dL Final  05/26/2017 11:52 PM 3.05 (H) 0.61 - 1.24 mg/dL Final  05/26/2017 05:54 AM 2.96 (H) 0.61 - 1.24 mg/dL Final  05/25/2017 02:51 AM 3.05 (H) 0.61 - 1.24 mg/dL Final   05/24/2017 12:44 PM 3.01 (H) 0.61 - 1.24 mg/dL Final  05/24/2017 10:16 AM 3.00 (H) 0.61 - 1.24 mg/dL Final  05/24/2017 09:54 AM 3.10 (H) 0.61 - 1.24 mg/dL Final  04/24/2015 09:42 AM 1.29 (H) 0.61 - 1.24 mg/dL Final  12/13/2010 06:00 AM 0.85 0.50 - 1.35 mg/dL Final  12/12/2010 06:45 AM 0.79 0.50 - 1.35 mg/dL Final  12/11/2010 06:40 AM 0.77 0.50 - 1.35 mg/dL Final  12/10/2010 05:54 AM 0.84 0.50 - 1.35 mg/dL Final  12/09/2010 02:55 PM 0.67 0.50 - 1.35 mg/dL Final  12/09/2010 11:25 AM 0.79 0.50 - 1.35 mg/dL Final  02/20/2010 08:46 PM 0.97 0.40 - 1.50 mg/dL Final    PMH:   Past Medical History:  Diagnosis Date  . Alcohol dependence ([place])   . Altered mental status   . Anorexia   . Carotid artery stenosis   . Cataract   . CHF (congestive heart failure) ([place])   . Chronic diastolic heart failure ([place])   . Chronic kidney disease, stage IV (severe) ([place])   . Cognitive deficits   . CVA (cerebral infarction) 11/2010   CT 8/12 : Sm ischemic infarct posterior limb of L internal capsule. Carotids nl. ECHO nl with EF 55-60%. R sided weakness.   . Dementia ([place])   . Dementia ([place])   . Dysphagia   . Failure to thrive in adult   . GI hemorrhage   . Hyperlipemia   .  Hyperlipidemia 01/06/2011   Diagnosed during acute CVA hospitalization. On statin.  Marland Kitchen Hypertension 11/2010   Diagnosed during acute CVA hospitalization  . Metabolic encephalopathy   . Occlusion of anterior cerebral artery   . Peripheral vascular disease ([place])   . Polysubstance abuse ([place]) 01/06/2011   cocaine, tobacco, THC  . Psychotic disorder ([place])   . PVD (peripheral vascular disease) ([place])   . Right hemiplegia ([place])   . Schizoaffective disorder ([place])   . Stroke [facility])     PSH:   Past Surgical History:  Procedure Laterality Date  . ESOPHAGOGASTRODUODENOSCOPY (EGD) WITH PROPOFOL N/A 06/25/2017   Dr. Oneida Alar: small hiatal hernia, gastritis/duodenititis. benign biopsy without H.pylori.     Allergies:  Allergies  Allergen  Reactions  . Pork-Derived Products Other (See Comments)    Patient does not eat pork products.    Medications:   Prior to Admission medications   Medication Sig Start Date End Date Taking? Authorizing Provider  amLODipine (NORVASC) 10 MG tablet Take 1 tablet (10 mg total) by mouth daily. 06/12/14   Funches, Adriana Mccallum, MD  atorvastatin (LIPITOR) 40 MG tablet Take 1 tablet (40 mg total) by mouth daily at 6 PM. 06/02/17   Patrecia Pour, Christean Grief, MD  cloNIDine (CATAPRES - DOSED IN MG/24 HR) 0.2 mg/24hr patch Place 0.2 mg onto the skin once a week.    [provider]  cloNIDine (CATAPRES) 0.2 MG tablet Take 1 tablet (0.2 mg total) by mouth 2 (two) times daily. Patient taking differently: Take 0.1 mg by mouth 2 (two) times daily.  06/02/17   Doreatha Lew, MD  clopidogrel (PLAVIX) 75 MG tablet Take 1 tablet (75 mg total) by mouth daily. 06/03/17   Doreatha Lew, MD  dexamethasone (DECADRON) 4 MG tablet Take 4 mg by mouth 2 (two) times daily with a meal.    [provider]  divalproex (DEPAKOTE) 250 MG DR tablet Take 250 mg by mouth daily.    [provider]  hydrALAZINE (APRESOLINE) 100 MG tablet Take 100 mg by mouth 3 (three) times daily.    [provider]  OLANZapine (ZYPREXA) 5 MG tablet Take 5 mg by mouth at bedtime.    [provider]  pantoprazole (PROTONIX) 40 MG tablet Take 1 tablet (40 mg total) by mouth 2 (two) times daily before a meal. 06/25/17 06/25/18  Kathie Dike, MD  sertraline (ZOLOFT) 25 MG tablet Take 25 mg by mouth daily.     [provider]  vitamin C (ASCORBIC ACID) 250 MG tablet Take 250 mg by mouth daily.    [provider]    Inpatient medications: . sodium zirconium cyclosilicate  10 g Oral Daily    Discontinued Meds:   Medications Discontinued During This Encounter  Medication Reason  . 0.45 % sodium chloride infusion     Social History:  reports that he has been smoking cigarettes. He started  smoking about 54 years ago. He has a 20.00 pack-year smoking history. He has never used smokeless tobacco. He reports current alcohol use of about 2.0 standard drinks of alcohol per week. He reports previous drug use. Drug: Cocaine.  Family History:   Family History  Problem Relation Age of Onset  . Hypertension Mother   . Hypertension Father     Review of systems not obtained due to patient factors. Weight change:   Intake/Output Summary (Last 24 hours) at 08/29/2019 0947 Last data filed at 08/29/2019 0641 Gross per 24 hour  Intake 370.64 ml  Output 500 ml  Net -129.36 ml   BP (!) 167/69 (BP Location: Right Arm)   Pulse (!) 59   Temp 99.3 F (37.4 C) (Oral)   Resp 20   Ht 5\' 5"  (1.651 m)   Wt 52.2 kg   SpO2 97%   BMI 19.14 kg/m  Vitals:   08/29/19 0018 08/29/19 0020 08/29/19 0314 08/29/19 0631  BP: (!) 176/65 (!) 176/65 (!) 141/76 (!) 167/69  Pulse: 69 67 62 (!) 59  Resp: 18  20 20   Temp: 99 F (37.2 C) 99 F (37.2 C) 99.3 F (37.4 C) 99.3 F (37.4 C)  TempSrc: Oral Oral Oral Oral  SpO2: 100% 100% 96% 97%  Weight:      Height:         General appearance: fatigued, no distress and slowed mentation Head: Normocephalic, without obvious abnormality, atraumatic Resp: clear to auscultation bilaterally Cardio: regular rate and rhythm, S1, S2 normal, no murmur, click, rub or gallop GI: soft, non-tender; bowel sounds normal; no masses,  no organomegaly Extremities: extremities normal, atraumatic, no cyanosis or edema Neuro:  right sided hemiparesis  Labs: Basic Metabolic Panel: Recent Labs  Lab 08/28/19 1935 08/29/19 0806  NA 141 140  K 6.1* 5.5*  CL 105 105  CO2 27 24  GLUCOSE 108* 102*  BUN 85* 79*  CREATININE 4.05* 3.51*  ALBUMIN 3.7 3.0*  CALCIUM 8.8* 8.6*  PHOS  --  4.6   Liver Function Tests: Recent Labs  Lab 08/28/19 1935 08/29/19 0806  AST 15  --   ALT 12  --   ALKPHOS 79  --   BILITOT 0.5  --   PROT 7.8  --   ALBUMIN 3.7 3.0*   No  results for input(s): LIPASE, AMYLASE in the last 168 hours. No results for input(s): AMMONIA in the last 168 hours. CBC: Recent Labs  Lab 08/28/19 1935  WBC 7.6  NEUTROABS 5.0  HGB 11.1*  HCT 36.7*  MCV 87.4  PLT 262   PT/INR: @LABRCNTIP (inr:5) Cardiac Enzymes: )No results for input(s): CKTOTAL, CKMB, CKMBINDEX, TROPONINI in the last 168 hours. CBG: Recent Labs  Lab 08/28/19 2235 08/28/19 2310 08/28/19 2325 08/29/19 0020 08/29/19 0220  GLUCAP 98 49* 65* 81 130*    Iron Studies:  Recent Labs  Lab 08/29/19 0443  IRON 57  TIBC 190*  FERRITIN 782*    Xrays/Other Studies: DG Chest Portable 1 View  Result Date: 08/28/2019 CLINICAL DATA:  Fever EXAM: PORTABLE CHEST 1 VIEW COMPARISON:  June 24, 2017 FINDINGS: The cardiomediastinal silhouette is enlarged. Aortic calcifications are again noted. The lungs appear essentially clear. There is no pneumothorax or focal infiltrate. No significant pleural effusion. IMPRESSION: No active disease. Electronically Signed   By: Constance Holster M.D.   On: 08/28/2019 20:19     Assessment/Plan: 1.  AKI/CKD stage IV- Uncertain etiology.  Possibly due to volume depletion vs BOO +/- infection.  Cannot get history of NSAIDs or COX-II I's.  1. Check FeNa, bladder scan, renal US, renal dose meds 2. Resume IVF's as he is not taking po and appears hypovolemic 3. No urgent indication for dialysis at this time 4. Need to find out his MAR from SNF 5. Hold off on acute GN workup for now.  2. Hyperkalemia- due to #1.  Improving with medical management.  redose lokelma and start IVF's as above.  Will recheck later today. 3. Fever- likely UTI given UA, cultures pending.  Started on ceftriaxone per primary 4. Hypoglycemia- on  IV D10 per primary 5. Hypertensive urgency- on amlodipine with improvement.  Need MAR from [facility]. 6. Anemia of CKD stage IV- stable 7. CKD stage IV- presumably due to HTN but also has history of cocaine abuse and  possible covid-19 infection in September 2020. 8. Chronic diastolic CHF- appears volume depleted on exam 9. FTT- noted on outside records for diagnosis but no office visits available for review 10. Moderate protein malnutrition- supplement and follow.  11. Disposition- recommend palliative care consult to help set goals/limits of care as he is full code and has been having FTT with vascular dementia and progressive CKD.   Governor [place] Jaslynne Dahan 08/29/2019, 9:47 AM

## 2019-08-29 NOTE — Progress Notes (Signed)
CRITICAL VALUE ALERT  Critical Value:  Blood cultures: Anaerobic and aerobic cultures both came back positive for gram negative cocci  Date & Time Notied:  08/29/2019  Provider Notified: Dr. Dyann Kief  Orders Received/Actions taken: 06:35pm Message to [facility]

## 2019-08-30 LAB — GLUCOSE, CAPILLARY
Glucose-Capillary: 106 mg/dL — ABNORMAL HIGH (ref 70–99)
Glucose-Capillary: 161 mg/dL — ABNORMAL HIGH (ref 70–99)
Glucose-Capillary: 89 mg/dL (ref 70–99)
Glucose-Capillary: 91 mg/dL (ref 70–99)
Glucose-Capillary: 93 mg/dL (ref 70–99)
Glucose-Capillary: 94 mg/dL (ref 70–99)

## 2019-08-30 LAB — URINE CULTURE: Culture: >=100000 — AB

## 2019-08-30 LAB — URINALYSIS, COMPLETE (UACMP) WITH MICROSCOPIC
Bilirubin Urine: NEGATIVE
Glucose, UA: NEGATIVE mg/dL
Hgb urine dipstick: NEGATIVE
Ketones, ur: NEGATIVE mg/dL
Leukocytes,Ua: NEGATIVE
Nitrite: NEGATIVE
Protein, ur: 100 mg/dL — AB
Specific Gravity, Urine: 1.011 (ref 1.005–1.030)
pH: 7 (ref 5.0–8.0)

## 2019-08-30 LAB — RENAL FUNCTION PANEL
Albumin: 3 g/dL — ABNORMAL LOW (ref 3.5–5.0)
Anion gap: 9 (ref 5–15)
BUN: 69 mg/dL — ABNORMAL HIGH (ref 8–23)
CO2: 22 mmol/L (ref 22–32)
Calcium: 8.4 mg/dL — ABNORMAL LOW (ref 8.9–10.3)
Chloride: 107 mmol/L (ref 98–111)
Creatinine, Ser: 3.46 mg/dL — ABNORMAL HIGH (ref 0.61–1.24)
GFR calc Af Amer: 20 mL/min — ABNORMAL LOW (ref >60)
GFR calc non Af Amer: 17 mL/min — ABNORMAL LOW (ref >60)
Glucose, Bld: 95 mg/dL (ref 70–99)
Phosphorus: 4 mg/dL (ref 2.5–4.6)
Potassium: 4.8 mmol/L (ref 3.5–5.1)
Sodium: 138 mmol/L (ref 135–145)

## 2019-08-30 LAB — CREATININE, URINE, RANDOM: Creatinine, Urine: 64.24 mg/dL

## 2019-08-30 LAB — SODIUM, URINE, RANDOM: Sodium, Ur: 92 mmol/L

## 2019-08-30 MED ORDER — LABETALOL HCL 5 MG/ML IV SOLN
10.0000 mg | Freq: Once | INTRAVENOUS | Status: AC
  Administered 2019-08-30: 10 mg via INTRAVENOUS
  Filled 2019-08-30: qty 4

## 2019-08-30 MED ORDER — FENTANYL CITRATE (PF) 100 MCG/2ML IJ SOLN
12.5000 ug | Freq: Once | INTRAMUSCULAR | Status: AC
  Administered 2019-08-30: 12.5 ug via INTRAVENOUS
  Filled 2019-08-30: qty 2

## 2019-08-30 MED ORDER — HYDROMORPHONE HCL 1 MG/ML IJ SOLN
0.5000 mg | INTRAMUSCULAR | Status: AC | PRN
  Administered 2019-08-30: 0.5 mg via INTRAVENOUS
  Filled 2019-08-30: qty 0.5

## 2019-08-30 MED ORDER — TRAMADOL HCL 50 MG PO TABS
50.0000 mg | ORAL_TABLET | Freq: Two times a day (BID) | ORAL | Status: AC | PRN
  Administered 2019-08-31: 50 mg via ORAL
  Filled 2019-08-30: qty 1

## 2019-08-30 MED ORDER — METHOCARBAMOL 500 MG PO TABS
500.0000 mg | ORAL_TABLET | Freq: Three times a day (TID) | ORAL | Status: DC | PRN
  Administered 2019-08-30 – 2019-09-01 (×2): 500 mg via ORAL
  Filled 2019-08-30 (×2): qty 1

## 2019-08-30 NOTE — Progress Notes (Signed)
PROGRESS NOTE    Lucas [name]  KGY:[id number] DOB: Mar 10, 1951 DOA: 08/28/2019 PCP: [name], MD  Chief Complaint  Patient presents with  . Fever  . abnormal lab    Brief Narrative:  As per H&P written by Dr. Olevia Bowens on 08/28/2019 69 y.o. male with medical history significant of dependence, history of AMS, anorexia, carotid artery stenosis, cataracts, chronic diastolic heart failure, stage IV chronic kidney disease, history of ischemic CVA, dementia, dysphagia, history of failure to thrive, history of GI bleed, hyperlipidemia, hypertension, history of metabolic encephalopathy, pro vascular disease, cocaine use, schizoaffective disorder who is brought to the emergency department from [facility] due to abnormal blood test and fever over the weekend.  He is unable to provide much history and we were unable to obtain further history from the nursing home staff.  He is able to answer simple questions and denies headache, dyspnea, chest pain, abdominal pain, but states he is hungry.  ED Course: Initial vital signs temperature 98 F, pulse 60, respirations 20, BP 190/73 mmHg and O2 sat 99% on room air.  The case was discussed with on-call nephrology by Dr. [name].  He was given calcium gluconate, dextrose, 5 units of NovoLog IV, 10 g of Lokelma p.o. and a 500 mL NS bolus.  Urinalysis showed a cloudy appearance with glucosuria 50 and proteinuria 100 mg/dL.  There was small hemoglobinuria, large leukocyte esterase, more than 50 WBC per hpf with rare bacteria.  CBC shows a white count of 7.6, hemoglobin 11.1 g/dL platelets 262.CMP shows a potassium of 6.1 mmol/L.  All other electrolytes are within normal range when calcium is corrected to albumin.  LFTs are normal.  Glucose 108, BUN 85, creatinine 4.05 mg/dL.  Baseline creatinine is usually under 3.0 mg/dL.  His chest radiograph did not show any acute cardiopulmonary pathology.        Assessment & Plan: 1-acute encephalopathy -Appears to be  metabolic in nature in the setting of worsening renal function, mild anemia and acute UTI. -Continue current antibiotics -Continue IV fluids -Follow renal function trend -Follow further recommendations by nephrology service. -Minimize the use of sedative medications and follow clinical response.    AKI (acute kidney injury) on chronic kidney disease a stage IV//hyperkalemia -Appears to be multifactorial in the setting of continued use of nephrotoxic agents prior to admission, prerenal azotemia, UTI and further progression of underlying renal failure. -Nephrology service is on board and will follow recommendation -Checking renal ultrasound to look for obstructive uropathy -Continue IV fluids -Follow renal function electrolytes trend; creatinine trending down appropriately. -Still completing work-up. -Continue to follow electrolytes trend.  HTN (hypertension) -Stable overall -Continue following vital signs and adjust antihypertensive regimen as required.  Hyperlipidemia with target LDL less than 100 -Continue statins.  DNR -Patient with DNR papers from facility prior to admission -Will respect her wishes  Acute lower UTI: Enterobacter/E. coli) -Continue Rocephin -Continue to follow-up sensitivity. -Maintain adequate hydration.  Normocytic anemia -In the setting of chronic kidney disease -Hemoglobin appears stable -No signs of operative bleeding -Continue to follow trend.   DVT prophylaxis: SCDs Code Status: DNR Family Communication: No family at bedside and unable to be reached over the phone. Disposition:   Status is: Inpatient  Dispo: The patient is from: SNF              Anticipated d/c is : Nursing facility.              Anticipated d/c date is: To be determined  Patient currently no medically stable in the setting of ongoing encephalopathy, IV antibiotics while waiting on culture sensitivity and also receiving IVF's in the setting of poor oral  intake.     Consultants:   Nephrology service (Dr. Arty Baumgartner).   Procedures:  See below for x-ray reports  Antimicrobials:  Rocephin (08/28/19)  Subjective: Low-grade temperature overnight; no chest pain, no nausea, no vomiting. Oriented x1 only. Able to follow  simple commands. Poor oral intake  Objective: Vitals:   08/29/19 1932 08/29/19 2056 08/30/19 0538 08/30/19 1436  BP:  (!) 173/72 (!) 159/66 (!) 175/80  Pulse:  81 61 77  Resp:  20 20 16   Temp:  99.3 F (37.4 C) 99.4 F (37.4 C) 97.6 F (36.4 C)  TempSrc:  Oral Oral Oral  SpO2: 100% 99% 99% 100%  Weight:      Height:        Intake/Output Summary (Last 24 hours) at 08/30/2019 1648 Last data filed at 08/30/2019 1500 Gross per 24 hour  Intake 1806.27 ml  Output 1253 ml  Net 553.27 ml   Filed Weights   08/28/19 1815  Weight: 52.2 kg    Examination: General exam: Alert, awake, oriented x 1; able to follow simple commands. Chronically ill in appearance, underweight and in no acute distress. Patient is afebrile. Denies chest pain, no nausea, no vomiting. Low-grade temperature overnight appreciated. Respiratory system: Good oxygen saturation on room air; no shortness of breath, no using accessory muscles. Cardiovascular system: Rate controlled, no rubs, no gallops, no JVD. Gastrointestinal system: Abdomen is nondistended, soft and nontender. No organomegaly or masses felt. Normal bowel sounds heard. Central nervous system: No new focal neurological deficits. Patient with chronic right-sided hemiparesis. Extremities: No cyanosis or clubbing. Skin: No rashes, no petechiae. Psychiatry: Overall with flat affect; no agitation.    Data Reviewed: I have personally reviewed following labs and imaging studies  CBC: Recent Labs  Lab 08/28/19 1935  WBC 7.6  NEUTROABS 5.0  HGB 11.1*  HCT 36.7*  MCV 87.4  PLT 485    Basic Metabolic Panel: Recent Labs  Lab 08/28/19 1935 08/29/19 0806 08/29/19 1513  08/30/19 0548  NA 141 140 137 138  K 6.1* 5.5* 5.0 4.8  CL 105 105 104 107  CO2 27 24 23 22   GLUCOSE 108* 102* 110* 95  BUN 85* 79* 72* 69*  CREATININE 4.05* 3.51* 3.66* 3.46*  CALCIUM 8.8* 8.6* 8.4* 8.4*  MG 3.0*  --   --   --   PHOS  --  4.6  --  4.0    GFR: Estimated Creatinine Clearance: 15.5 mL/min (A) (by C-G formula based on SCr of 3.46 mg/dL (H)).  Liver Function Tests: Recent Labs  Lab 08/28/19 1935 08/29/19 0806 08/30/19 0548  AST 15  --   --   ALT 12  --   --   ALKPHOS 79  --   --   BILITOT 0.5  --   --   PROT 7.8  --   --   ALBUMIN 3.7 3.0* 3.0*    CBG: Recent Labs  Lab 08/30/19 0026 08/30/19 0410 08/30/19 0747 08/30/19 1106 08/30/19 1626  GLUCAP 106* 93 91 94 161*     Recent Results (from the past 240 hour(s))  Urine culture     Status: Abnormal   Collection Time: 08/28/19  6:48 PM   Specimen: Urine, Clean Catch  Result Value Ref Range Status   Specimen Description   Final    URINE, CLEAN  CATCH Performed at [facility], [address] [place], [place] [address]    Special Requests   Final    NONE Performed at [facility], [address], [place], [place] [address]    Culture (A)  Final    >=100,000 COLONIES/mL GROUP B STREP(S.AGALACTIAE)ISOLATED TESTING AGAINST S. AGALACTIAE NOT ROUTINELY PERFORMED DUE TO PREDICTABILITY OF AMP/PEN/VAN SUSCEPTIBILITY. Performed at [facility], [place] [address], [place], [place] [address]    Report Status 08/30/2019 FINAL  Final  SARS Coronavirus 2 by RT PCR (hospital order, performed in [facility] hospital lab) Nasopharyngeal Nasopharyngeal Swab     Status: None   Collection Time: 08/28/19  6:49 PM   Specimen: Nasopharyngeal Swab  Result Value Ref Range Status   SARS Coronavirus 2 NEGATIVE NEGATIVE Final    Comment: (NOTE) SARS-CoV-2 target nucleic acids are NOT DETECTED. The SARS-CoV-2 RNA is generally detectable in upper and lower respiratory specimens during the acute phase of infection. The  lowest concentration of SARS-CoV-2 viral copies this assay can detect is 250 copies / mL. A negative result does not preclude SARS-CoV-2 infection and should not be used as the sole basis for treatment or other patient management decisions.  A negative result may occur with improper specimen collection / handling, submission of specimen other than nasopharyngeal swab, presence of viral mutation(s) within the areas targeted by this assay, and inadequate number of viral copies (<250 copies / mL). A negative result must be combined with clinical observations, patient history, and epidemiological information. Fact Sheet for Patients:   StrictlyIdeas.no Fact Sheet for Healthcare Providers: BankingDealers.co.za This test is not yet approved or cleared  by the [place] FDA and has been authorized for detection and/or diagnosis of SARS-CoV-2 by FDA under an Emergency Use Authorization (EUA).  This EUA will remain in effect (meaning this test can be used) for the duration of the COVID-19 declaration under Section 564(b)(1) of the Act, 21 U.S.C. section 360bbb-3(b)(1), unless the authorization is terminated or revoked sooner. Performed at [facility], [address], [place], [place] [address]   Culture, blood (routine x 2)     Status: None (Preliminary result)   Collection Time: 08/28/19  7:35 PM   Specimen: Right Antecubital; Blood  Result Value Ref Range Status   Specimen Description RIGHT ANTECUBITAL  Final   Special Requests   Final    BOTTLES DRAWN AEROBIC AND ANAEROBIC Blood Culture adequate volume   Culture   Final    NO GROWTH 2 DAYS Performed at [facility], [address], [place], [place] [address]    Report Status PENDING  Incomplete  Culture, blood (routine x 2)     Status: Abnormal (Preliminary result)   Collection Time: 08/28/19  7:35 PM   Specimen: BLOOD  Result Value Ref Range Status   Specimen Description   Final     BLOOD RIGHT ANTECUBITAL Performed at [facility], [place] [address], [place], [place] [address]    Special Requests   Final    BOTTLES DRAWN AEROBIC AND ANAEROBIC Blood Culture adequate volume Performed at [facility], [address] [place] , [place] [address]    Culture  Setup Time   Final    GRAM POSITIVE COCCI AEROBIC AND ANAEROBIC BOTTLES Gram Stain Report Called to,Read Back By and Verified With: PARSONS,L ON 08/29/19 AT 1830 BY LOY,C PERFORMED AT [facility] CRITICAL RESULT CALLED TO, READ BACK BY AND VERIFIED WITH: Coralie Common RN 08/29/19 2338 JDW    Culture (A)  Final    STAPHYLOCOCCUS SPECIES (COAGULASE NEGATIVE) THE SIGNIFICANCE OF ISOLATING THIS ORGANISM FROM A SINGLE SET OF BLOOD CULTURES WHEN MULTIPLE SETS ARE DRAWN IS UNCERTAIN. PLEASE NOTIFY THE MICROBIOLOGY DEPARTMENT WITHIN ONE WEEK IF SPECIATION AND SENSITIVITIES ARE REQUIRED. Performed at [facility], [place] [address] [place], [place] [address]    Report Status PENDING  Incomplete  Blood Culture ID Panel (Reflexed)     Status: Abnormal   Collection Time: 08/28/19  7:35 PM  Result Value Ref Range Status   Enterococcus species NOT DETECTED NOT DETECTED Final   Listeria monocytogenes NOT DETECTED NOT DETECTED Final   Staphylococcus species DETECTED (A) NOT DETECTED Final    Comment: Methicillin (oxacillin) susceptible coagulase negative staphylococcus. Possible blood culture contaminant (unless isolated from more than one blood culture draw or clinical case suggests pathogenicity). No antibiotic treatment is indicated for blood  culture contaminants. CRITICAL RESULT CALLED TO, READ BACK BY AND VERIFIED WITH: Coralie Common RN 08/29/19 2338 JDW    Staphylococcus aureus (BCID) NOT DETECTED NOT DETECTED Final   Methicillin resistance NOT DETECTED NOT DETECTED Final   Streptococcus species NOT DETECTED NOT DETECTED Final   Streptococcus agalactiae NOT DETECTED NOT DETECTED Final   Streptococcus pneumoniae NOT DETECTED NOT DETECTED  Final   Streptococcus pyogenes NOT DETECTED NOT DETECTED Final   Acinetobacter baumannii NOT DETECTED NOT DETECTED Final   Enterobacteriaceae species NOT DETECTED NOT DETECTED Final   Enterobacter cloacae complex NOT DETECTED NOT DETECTED Final   Escherichia coli NOT DETECTED NOT DETECTED Final   Klebsiella oxytoca NOT DETECTED NOT DETECTED Final   Klebsiella pneumoniae NOT DETECTED NOT DETECTED Final   Proteus species NOT DETECTED NOT DETECTED Final   Serratia marcescens NOT DETECTED NOT DETECTED Final   Haemophilus influenzae NOT DETECTED NOT DETECTED Final   Neisseria meningitidis NOT DETECTED NOT DETECTED Final   Pseudomonas aeruginosa NOT DETECTED NOT DETECTED Final   Candida albicans NOT DETECTED NOT DETECTED Final   Candida glabrata NOT DETECTED NOT DETECTED Final   Candida krusei NOT DETECTED NOT DETECTED Final   Candida parapsilosis NOT DETECTED NOT DETECTED Final   Candida tropicalis NOT DETECTED NOT DETECTED Final    Comment: Performed at [facility], [address], [place], [place] [address]     Radiology Studies: US RENAL  Result Date: 08/29/2019 CLINICAL DATA:  Acute renal failure. EXAM: RENAL / URINARY TRACT ULTRASOUND COMPLETE COMPARISON:  None. FINDINGS: Right Kidney: Renal measurements: 10.3 x 4.8 x 5.2 cm = volume: 136 mL. Echogenic parenchyma. No cyst, mass, stone or hydronephrosis. Left Kidney: Left kidney could not be identified by the technologist because of regional intestinal gas. Bladder: Collapsed, bladder catheter in place. Other: None. IMPRESSION: Right kidney is normal size but shows abnormal echogenicity consistent with renal parenchymal disease. No obstruction. Left kidney could not be found by the technologist due to extensive regional bowel gas. Electronically Signed   By: Nelson Chimes M.D.   On: 08/29/2019 12:46   DG Chest Portable 1 View  Result Date: 08/28/2019 CLINICAL DATA:  Fever EXAM: PORTABLE CHEST 1 VIEW COMPARISON:  June 24, 2017  FINDINGS: The cardiomediastinal silhouette is enlarged. Aortic calcifications are again noted. The lungs appear essentially clear. There is no pneumothorax or focal infiltrate. No significant pleural effusion. IMPRESSION: No active disease. Electronically Signed   By: Constance Holster M.D.   On: 08/28/2019 20:19    Scheduled Meds: . sodium zirconium cyclosilicate  10 g Oral BID   Continuous Infusions: .  sodium chloride 75 mL/hr at 08/30/19 1121  . cefTRIAXone (ROCEPHIN)  IV 1 g (08/29/19 2215)     LOS: 1 day    Time spent: 30 minutes.    Barton Dubois, MD [facility]   To contact the attending provider between 7A-7P or the covering provider during after hours 7P-7A, please log into the web site [email or web address] and access using universal [employer] password for that web site. If you do not have the password, please call the hospital operator.  08/30/2019, 4:48 PM

## 2019-08-30 NOTE — Progress Notes (Signed)
Patient ID: Lucas [name], male   DOB: 1951-02-08, 69 y.o.   MRN: [id number] S: No events overnight.  Blood cultures + for gram positive cocci, reflex also + for enterobacter and E. Coli.  Started on ceftriaxone since admission.  Also has 1250 cc of UOP overnight with improving Scr. O:BP (!) 159/66 (BP Location: Right Arm)   Pulse 61   Temp 99.4 F (37.4 C) (Oral)   Resp 20   Ht 5\' 5"  (1.651 m)   Wt 52.2 kg   SpO2 99%   BMI 19.14 kg/m   Intake/Output Summary (Last 24 hours) at 08/30/2019 0901 Last data filed at 08/30/2019 0530 Gross per 24 hour  Intake 1566.27 ml  Output 1250 ml  Net 316.27 ml   Intake/Output: I/O last 3 completed shifts: In: 1936.9 [I.V.:1836.9; IV Piggyback:100] Out: 1750 [Urine:1750]  Intake/Output this shift:  No intake/output data recorded. Weight change:  Gen: NAD CVS: RRR, no rub Resp: cta Abd: +BS, soft, NT/Nd Ext: no edema  Recent Labs  Lab 08/28/19 1935 08/29/19 0806 08/29/19 1513 08/30/19 0548  NA 141 140 137 138  K 6.1* 5.5* 5.0 4.8  CL 105 105 104 107  CO2 27 24 23 22   GLUCOSE 108* 102* 110* 95  BUN 85* 79* 72* 69*  CREATININE 4.05* 3.51* 3.66* 3.46*  ALBUMIN 3.7 3.0*  --  3.0*  CALCIUM 8.8* 8.6* 8.4* 8.4*  PHOS  --  4.6  --  4.0  AST 15  --   --   --   ALT 12  --   --   --    Liver Function Tests: Recent Labs  Lab 08/28/19 1935 08/29/19 0806 08/30/19 0548  AST 15  --   --   ALT 12  --   --   ALKPHOS 79  --   --   BILITOT 0.5  --   --   PROT 7.8  --   --   ALBUMIN 3.7 3.0* 3.0*   No results for input(s): LIPASE, AMYLASE in the last 168 hours. No results for input(s): AMMONIA in the last 168 hours. CBC: Recent Labs  Lab 08/28/19 1935  WBC 7.6  NEUTROABS 5.0  HGB 11.1*  HCT 36.7*  MCV 87.4  PLT 262   Cardiac Enzymes: No results for input(s): CKTOTAL, CKMB, CKMBINDEX, TROPONINI in the last 168 hours. CBG: Recent Labs  Lab 08/29/19 0020 08/29/19 0220 08/30/19 0026 08/30/19 0410 08/30/19 0747  GLUCAP 81 130*  106* 93 91    Iron Studies:  Recent Labs    08/29/19 0443  IRON 57  TIBC 190*  FERRITIN 782*   Studies/Results: US RENAL  Result Date: 08/29/2019 CLINICAL DATA:  Acute renal failure. EXAM: RENAL / URINARY TRACT ULTRASOUND COMPLETE COMPARISON:  None. FINDINGS: Right Kidney: Renal measurements: 10.3 x 4.8 x 5.2 cm = volume: 136 mL. Echogenic parenchyma. No cyst, mass, stone or hydronephrosis. Left Kidney: Left kidney could not be identified by the technologist because of regional intestinal gas. Bladder: Collapsed, bladder catheter in place. Other: None. IMPRESSION: Right kidney is normal size but shows abnormal echogenicity consistent with renal parenchymal disease. No obstruction. Left kidney could not be found by the technologist due to extensive regional bowel gas. Electronically Signed   By: Nelson Chimes M.D.   On: 08/29/2019 12:46   DG Chest Portable 1 View  Result Date: 08/28/2019 CLINICAL DATA:  Fever EXAM: PORTABLE CHEST 1 VIEW COMPARISON:  June 24, 2017 FINDINGS: The cardiomediastinal silhouette is enlarged. Aortic  calcifications are again noted. The lungs appear essentially clear. There is no pneumothorax or focal infiltrate. No significant pleural effusion. IMPRESSION: No active disease. Electronically Signed   By: Constance Holster M.D.   On: 08/28/2019 20:19   . sodium zirconium cyclosilicate  10 g Oral BID    BMET    Component Value Date/Time   NA 138 08/30/2019 0548   K 4.8 08/30/2019 0548   CL 107 08/30/2019 0548   CO2 22 08/30/2019 0548   GLUCOSE 95 08/30/2019 0548   BUN 69 (H) 08/30/2019 0548   CREATININE 3.46 (H) 08/30/2019 0548   CREATININE 0.99 08/22/2012 0948   CALCIUM 8.4 (L) 08/30/2019 0548   GFRNONAA 17 (L) 08/30/2019 0548   GFRNONAA 83 08/22/2012 0948   GFRAA 20 (L) 08/30/2019 0548   GFRAA >89 08/22/2012 0948   CBC    Component Value Date/Time   WBC 7.6 08/28/2019 1935   RBC 3.86 (L) 08/29/2019 0443   RBC 4.20 (L) 08/28/2019 1935   HGB 11.1 (L)  08/28/2019 1935   HCT 36.7 (L) 08/28/2019 1935   PLT 262 08/28/2019 1935   MCV 87.4 08/28/2019 1935   MCH 26.4 08/28/2019 1935   MCHC 30.2 08/28/2019 1935   RDW 16.5 (H) 08/28/2019 1935   LYMPHSABS 1.4 08/28/2019 1935   MONOABS 0.6 08/28/2019 1935   EOSABS 0.5 08/28/2019 1935   BASOSABS 0.0 08/28/2019 1935    Assessment/Plan: 1.  AKI/CKD stage IV- Uncertain etiology.  Possibly due to ischemic ATN/volume depletion vs BOO +/- infection.  Cannot get history of NSAIDs or COX-II I's.  1. FeNa pending, renal US without obstruction, renal dose meds 2. BUN/Cr and UOP improving with IVF's 3. Continue with IV NS as he is not taking po and appears hypovolemic 4. Need to find out his MAR from SNF 5. Hold off on acute GN workup for now.  2. Hyperkalemia- due to #1.  Improving with medical management.  continue to follow. 3. ID- pt with h/o Fever- Tmax 99.4 this am. Urine cultures no growth to date but blood cultures + for GPC in clusters, likely MSSE.  Started on ceftriaxone per primary but may want to narrow coverage.  Reflex also + for enterobacter and E. Coli.  Follow blood cultures and sensitivies 4. Hypoglycemia- on IV D10 per primary 5. Hypertensive urgency- on amlodipine with improvement.  Need MAR from [facility]. 6. Anemia of CKD stage IV- stable 7. CKD stage IV- presumably due to HTN but also has history of cocaine abuse and possible covid-19 infection in September 2020. 8. Chronic diastolic CHF- appears volume depleted on exam 9. FTT- noted on outside records for diagnosis but no office visits available for review 10. Moderate protein malnutrition- supplement and follow.  11. Disposition- recommend palliative care consult to help set goals/limits of care as he is full code and has been having FTT with vascular dementia and progressive CKD.  Donetta Potts, MD [employer] [phone]

## 2019-08-30 NOTE — Progress Notes (Signed)
MD notified of bp 210/80 and of pt c/o pain. New orders given and medication administered.

## 2019-08-30 NOTE — Evaluation (Signed)
Physical Therapy Evaluation Patient Details Name: Lucas [name] MRN: [id number] DOB: July 01, 1950 Today's Date: 08/30/2019   History of Present Illness  Lucas [name] is a 69 y.o. male with medical history significant of dependence, history of AMS, anorexia, carotid artery stenosis, cataracts, chronic diastolic heart failure, stage IV chronic kidney disease, history of ischemic CVA, dementia, dysphagia, history of failure to thrive, history of GI bleed, hyperlipidemia, hypertension, history of metabolic encephalopathy, pro vascular disease, cocaine use, schizoaffective disorder who is brought to the emergency department from [facility] due to abnormal blood test and fever over the weekend.  He is unable to provide much history and we were unable to obtain further history from the nursing home staff.  He is able to answer simple questions and denies headache, dyspnea, chest pain, abdominal pain, but states he is hungry.    Clinical Impression  Patient functioning at baseline which is total assist for transfers and nonambulatory.  Patient presents with severe BLE hamstring contractures and unable to sit up in bed without total assistance.  Plan:  Plan:  Patient discharged from physical therapy to care of nursing for out of bed as tolerated with use of mechanical lift for length of stay.    Follow Up Recommendations SNF;Supervision for mobility/OOB;Supervision - Intermittent    Equipment Recommendations  None recommended by PT    Recommendations for Other Services       Precautions / Restrictions Precautions Precautions: Fall Restrictions Weight Bearing Restrictions: No      Mobility  Bed Mobility Overal bed mobility: Needs Assistance Bed Mobility: Supine to Sit;Sit to Supine     Supine to sit: Max assist;Total assist Sit to supine: Max assist;Total assist   General bed mobility comments: unable to extend knees due to contractures, poor tolerance for attempting to sit up at  bedside  Transfers                    Ambulation/Gait                Stairs            Wheelchair Mobility    Modified Rankin (Stroke Patients Only)       Balance Overall balance assessment: Needs assistance Sitting-balance support: Feet unsupported;Bilateral upper extremity supported Sitting balance-Leahy Scale: Poor Sitting balance - Comments: unable to maintain sitting balance due to bilateral hamstring contractures                                     Pertinent Vitals/Pain Pain Assessment: Faces Faces Pain Scale: Hurts even more Pain Location: bilateral knees when attempting to extend knees Pain Descriptors / Indicators: Grimacing;Guarding;Sore Pain Intervention(s): Limited activity within patient's tolerance;Monitored during session;Repositioned    Home Living Family/patient expects to be discharged to:: Skilled nursing facility                      Prior Function Level of Independence: Needs assistance   Gait / Transfers Assistance Needed: non-ambulatory, assisted for transfers  ADL's / Homemaking Assistance Needed: assisted by SNF staff        Hand Dominance        Extremity/Trunk Assessment   Upper Extremity Assessment Upper Extremity Assessment: Generalized weakness;RUE deficits/detail;LUE deficits/detail RUE Deficits / Details: grossly 2/5 with limited shoulder ROM due to contracture RUE Sensation: decreased proprioception RUE Coordination: decreased fine motor;decreased gross motor LUE Deficits /  Details: grossly -4/5 LUE Sensation: WNL LUE Coordination: WNL    Lower Extremity Assessment Lower Extremity Assessment: RLE deficits/detail;LLE deficits/detail RLE Deficits / Details: grossly -2/5, hamstring contractures and unable to extend knee past 100 degrees RLE: Unable to fully assess due to pain RLE Sensation: WNL RLE Coordination: decreased fine motor;decreased gross motor LLE Deficits / Details:  grossly -2/5, hamstring contractures and unable to extend knee past 100 degrees LLE: Unable to fully assess due to pain LLE Sensation: decreased proprioception LLE Coordination: decreased fine motor;decreased gross motor    Cervical / Trunk Assessment Cervical / Trunk Assessment: Normal  Communication   Communication: No difficulties  Cognition Arousal/Alertness: Awake/alert Behavior During Therapy: WFL for tasks assessed/performed Overall Cognitive Status: History of cognitive impairments - at baseline                                 General Comments: patient answers questions with inconsistent information      General Comments      Exercises     Assessment/Plan    PT Assessment Patent does not need any further PT services  PT Problem List         PT Treatment Interventions      PT Goals (Current goals can be found in the Care Plan section)  Acute Rehab PT Goals Patient Stated Goal: return to long term care at SNF PT Goal Formulation: With patient Time For Goal Achievement: 08/30/19 Potential to Achieve Goals: Good    Frequency     Barriers to discharge        Co-evaluation               AM-PAC PT "6 Clicks" Mobility  Outcome Measure Help needed turning from your back to your side while in a flat bed without using bedrails?: A Lot Help needed moving from lying on your back to sitting on the side of a flat bed without using bedrails?: A Lot Help needed moving to and from a bed to a chair (including a wheelchair)?: Total Help needed standing up from a chair using your arms (e.g., wheelchair or bedside chair)?: Total Help needed to walk in hospital room?: Total Help needed climbing 3-5 steps with a railing? : Total 6 Click Score: 8    End of Session   Activity Tolerance: Patient tolerated treatment well;Patient limited by fatigue Patient left: in bed;with call bell/phone within reach Nurse Communication: Mobility status PT Visit  Diagnosis: Unsteadiness on feet (R26.81);Muscle weakness (generalized) (M62.81);Other abnormalities of gait and mobility (R26.89)    Time: 8309-4076 PT Time Calculation (min) (ACUTE ONLY): 21 min   Charges:   PT Evaluation $PT Eval Moderate Complexity: 1 Mod PT Treatments $Therapeutic Activity: 8-22 mins        11:08 AM, 08/30/19 Lonell Grandchild, MPT Physical Therapist with [facility] 336 [phone] office [phone] mobile phone

## 2019-08-30 NOTE — Progress Notes (Signed)
CRITICAL VALUE ALERT  Critical Value:  Blood cultures: 2/4 Gram positive cocci ID staphylococcus mecK negative  Date & Time Notied:  08/29/2019  Provider Notified: Mid-level   Orders Received/Actions taken: No new orders pt already has order for IV Rocephin

## 2019-08-30 NOTE — Progress Notes (Signed)
Pt cont to c/o bilateral lower leg pain. No swelling, redness or warmth noted to legs. Mid level notified with new orders in place. Medication administered. Call bell in reach.

## 2019-08-31 DIAGNOSIS — E875 Hyperkalemia: Secondary | ICD-10-CM

## 2019-08-31 DIAGNOSIS — I160 Hypertensive urgency: Secondary | ICD-10-CM

## 2019-08-31 DIAGNOSIS — N170 Acute kidney failure with tubular necrosis: Principal | ICD-10-CM

## 2019-08-31 LAB — RENAL FUNCTION PANEL
Albumin: 3.1 g/dL — ABNORMAL LOW (ref 3.5–5.0)
Anion gap: 12 (ref 5–15)
BUN: 54 mg/dL — ABNORMAL HIGH (ref 8–23)
CO2: 22 mmol/L (ref 22–32)
Calcium: 8.6 mg/dL — ABNORMAL LOW (ref 8.9–10.3)
Chloride: 108 mmol/L (ref 98–111)
Creatinine, Ser: 2.71 mg/dL — ABNORMAL HIGH (ref 0.61–1.24)
GFR calc Af Amer: 27 mL/min — ABNORMAL LOW (ref >60)
GFR calc non Af Amer: 23 mL/min — ABNORMAL LOW (ref >60)
Glucose, Bld: 97 mg/dL (ref 70–99)
Phosphorus: 5 mg/dL — ABNORMAL HIGH (ref 2.5–4.6)
Potassium: 4.3 mmol/L (ref 3.5–5.1)
Sodium: 142 mmol/L (ref 135–145)

## 2019-08-31 LAB — CBC
HCT: 28.7 % — ABNORMAL LOW (ref 39.0–52.0)
Hemoglobin: 9 g/dL — ABNORMAL LOW (ref 13.0–17.0)
MCH: 26.8 pg (ref 26.0–34.0)
MCHC: 31.4 g/dL (ref 30.0–36.0)
MCV: 85.4 fL (ref 80.0–100.0)
Platelets: 205 10*3/uL (ref 150–400)
RBC: 3.36 MIL/uL — ABNORMAL LOW (ref 4.22–5.81)
RDW: 15.9 % — ABNORMAL HIGH (ref 11.5–15.5)
WBC: 7.9 10*3/uL (ref 4.0–10.5)
nRBC: 0 % (ref 0.0–0.2)

## 2019-08-31 LAB — CULTURE, BLOOD (ROUTINE X 2): Special Requests: ADEQUATE

## 2019-08-31 LAB — GLUCOSE, CAPILLARY
Glucose-Capillary: 91 mg/dL (ref 70–99)
Glucose-Capillary: 91 mg/dL (ref 70–99)
Glucose-Capillary: 139 mg/dL — ABNORMAL HIGH (ref 70–99)
Glucose-Capillary: 101 mg/dL — ABNORMAL HIGH (ref 70–99)

## 2019-08-31 LAB — SARS CORONAVIRUS 2 BY RT PCR (HOSPITAL ORDER, PERFORMED IN ~~LOC~~ HOSPITAL LAB): SARS Coronavirus 2: NEGATIVE

## 2019-08-31 MED ORDER — OLANZAPINE 5 MG PO TABS
2.5000 mg | ORAL_TABLET | Freq: Every day | ORAL | Status: DC
  Administered 2019-08-31: 2.5 mg via ORAL
  Filled 2019-08-31: qty 1

## 2019-08-31 MED ORDER — AMLODIPINE BESYLATE 5 MG PO TABS
10.0000 mg | ORAL_TABLET | Freq: Every day | ORAL | Status: DC
  Administered 2019-08-31 – 2019-09-03 (×4): 10 mg via ORAL
  Filled 2019-08-31 (×4): qty 2

## 2019-08-31 MED ORDER — SERTRALINE HCL 50 MG PO TABS
50.0000 mg | ORAL_TABLET | Freq: Every day | ORAL | Status: DC
  Administered 2019-08-31 – 2019-09-01 (×2): 50 mg via ORAL
  Filled 2019-08-31 (×2): qty 1

## 2019-08-31 MED ORDER — CLONIDINE HCL 0.2 MG/24HR TD PTWK: 0.2000 mg | MEDICATED_PATCH | TRANSDERMAL | Status: DC

## 2019-08-31 MED ORDER — MIRTAZAPINE 15 MG PO TABS
7.5000 mg | ORAL_TABLET | Freq: Every day | ORAL | Status: DC
  Administered 2019-08-31: 7.5 mg via ORAL
  Filled 2019-08-31: qty 1

## 2019-08-31 MED ORDER — LABETALOL HCL 5 MG/ML IV SOLN
10.0000 mg | Freq: Once | INTRAVENOUS | Status: AC
  Administered 2019-08-31: 10 mg via INTRAVENOUS
  Filled 2019-08-31: qty 4

## 2019-08-31 MED ORDER — CLOPIDOGREL BISULFATE 75 MG PO TABS
75.0000 mg | ORAL_TABLET | Freq: Every day | ORAL | Status: DC
  Administered 2019-08-31 – 2019-09-03 (×4): 75 mg via ORAL
  Filled 2019-08-31 (×4): qty 1

## 2019-08-31 MED ORDER — DIVALPROEX SODIUM 125 MG PO CSDR
500.0000 mg | DELAYED_RELEASE_CAPSULE | Freq: Every day | ORAL | Status: DC
  Administered 2019-08-31 – 2019-09-03 (×4): 500 mg via ORAL
  Filled 2019-08-31 (×4): qty 4

## 2019-08-31 MED ORDER — GABAPENTIN 100 MG PO CAPS
100.0000 mg | ORAL_CAPSULE | Freq: Every day | ORAL | Status: DC
  Administered 2019-08-31 – 2019-09-01 (×2): 100 mg via ORAL
  Filled 2019-08-31 (×2): qty 1

## 2019-08-31 MED ORDER — ATORVASTATIN CALCIUM 40 MG PO TABS
40.0000 mg | ORAL_TABLET | Freq: Every day | ORAL | Status: DC
  Administered 2019-08-31 – 2019-09-03 (×4): 40 mg via ORAL
  Filled 2019-08-31 (×4): qty 1

## 2019-08-31 NOTE — Progress Notes (Signed)
PROGRESS NOTE  Lucas [name] MEQ:[id number] DOB: 1951/01/29 DOA: 08/28/2019 PCP: [name], MD  Brief History:  69 year old male with a history of vascular dementia, carotid stenosis, diastolic CHF, CKD stage IV, stroke, failure to thrive, hypertension, hyperlipidemia, GI bleed, schizoaffective disorder, cocaine use presenting from [facility] secondary to fever and abnormal blood work showing elevated serum creatinine.  The patient is unable to provide a significant history secondary to his dementia.  Review the records show that the patient has a serum creatinine of 4.52.  In the emergency department, the patient was awake and alert and able to answer simple questions, although he remained confused.  ED Course:Initial vital signs temperature 98 F, pulse 60, respirations 20, BP 190/73 mmHg and O2 sat 99% on room air.The case was discussed with on-call nephrology by Dr. [name]. He was given calcium gluconate, dextrose, 5 units of NovoLog IV, 10 g of Lokelma p.o. and a 500 mL NS bolus.  Urinalysis showed a cloudy appearance with glucosuria 50 and proteinuria 100 mg/dL. There was small hemoglobinuria, large leukocyte esterase, more than 50 WBC per hpf with rare bacteria. CBC shows a white count of 7.6, hemoglobin 11.1 g/dL platelets 262.CMP shows a potassium of 6.1 mmol/L. All other electrolytes are within normal range when calcium is corrected to albumin. LFTs are normal. Glucose 108, BUN 85, creatinine 4.05 mg/dL.   Assessment/Plan: Acute metabolic encephalopathy -Multifactorial including UTI, acute on chronic renal failure, electrolyte derangement, hypertensive encephalopathy, and medication effect -Mental status overall improved -Currently pleasantly confused which is the patient's likely baseline  Acute on chronic renal failure--CKD stage IV -Baseline creatinine 2.7-3.0 -Serum creatinine peaked at 4.52 -Secondary to volume depletion with possible ATN -Renal  ultrasound negative for hydronephrosis -Appreciate nephrology -Continue IV fluids -FeNa= 3.59%  Acute lower UTI -UA shows > 50 WBC -Urine culture with group B streptococcus -continue ceftriaxone  Bacteremia -CoNS one of two SETS -represents contaminant  Failure to thrive -Patient is only eating 25% of his meals -This appears to be the case even at [facility] -This will place him at high risk for recurrent acute on chronic renal failure -TSH -Serum W97 -Folic acid  Hypertensive urgency -Restart clonidine TTS 2 patch, amlodipine, hydralazine  Vascular dementia -Restart Plavix -Restart Remeron, olanzapine, Depakote, sertraline  Hyperlipidemia -Restart statin  Anemia of CKD -Baseline hemoglobin 9-10  Hyperkalemia -Improved with temporizing measures and Lokelma      Status is: Inpatient  Remains inpatient appropriate because:IV treatments appropriate due to intensity of illness or inability to take PO;  Continues to have poor po intake   Dispo: The patient is from: SNF              Anticipated d/c is to: SNF              Anticipated d/c date is: 1 day              Patient currently is not medically stable to d/c.         Family Communication:  No Family at bedside  Consultants:  renal  Code Status: DNR  DVT Prophylaxis:  [place] Heparin    Procedures: As Listed in Progress Note Above  Antibiotics: Ceftriaxone 5/18>>>     Subjective: Pt complains of leg pain.  Patient denies fevers, chills, headache, chest pain, dyspnea, nausea, vomiting, diarrhea, abdominal pain, dysuria, hematuria, hematochezia, and melena.   Objective: Vitals:   08/31/19 0059 08/31/19 0500 08/31/19  5176 08/31/19 1456  BP: (!) 182/68 (!) 190/74 (!) 198/69 (!) 156/81  Pulse: 69 62 67 73  Resp: 16 20 16 16   Temp: 98.7 F (37.1 C) 99.4 F (37.4 C) 98 F (36.7 C) 99.6 F (37.6 C)  TempSrc: Oral Oral Oral Oral  SpO2: 98% 97% 100% 100%  Weight:      Height:         Intake/Output Summary (Last 24 hours) at 08/31/2019 1539 Last data filed at 08/31/2019 0900 Gross per 24 hour  Intake 240 ml  Output 1700 ml  Net -1460 ml   Weight change:  Exam:   General:  Pt is alert, follows commands appropriately, not in acute distress  HEENT: No icterus, No thrush, No neck mass, [place]/AT  Cardiovascular: RRR, S1/S2, no rubs, no gallops  Respiratory: poor inspiratory effort, but CTA bilaterally, no wheezing, no crackles, no rhonchi  Abdomen: Soft/+BS, non tender, non distended, no guarding  Extremities: No edema, No lymphangitis, No petechiae, No rashes, no synovitis   Data Reviewed: I have personally reviewed following labs and imaging studies Basic Metabolic Panel: Recent Labs  Lab 08/28/19 1935 08/29/19 0806 08/29/19 1513 08/30/19 0548 08/31/19 0607  NA 141 140 137 138 142  K 6.1* 5.5* 5.0 4.8 4.3  CL 105 105 104 107 108  CO2 27 24 23 22 22   GLUCOSE 108* 102* 110* 95 97  BUN 85* 79* 72* 69* 54*  CREATININE 4.05* 3.51* 3.66* 3.46* 2.71*  CALCIUM 8.8* 8.6* 8.4* 8.4* 8.6*  MG 3.0*  --   --   --   --   PHOS  --  4.6  --  4.0 5.0*   Liver Function Tests: Recent Labs  Lab 08/28/19 1935 08/29/19 0806 08/30/19 0548 08/31/19 0607  AST 15  --   --   --   ALT 12  --   --   --   ALKPHOS 79  --   --   --   BILITOT 0.5  --   --   --   PROT 7.8  --   --   --   ALBUMIN 3.7 3.0* 3.0* 3.1*   No results for input(s): LIPASE, AMYLASE in the last 168 hours. No results for input(s): AMMONIA in the last 168 hours. Coagulation Profile: No results for input(s): INR, PROTIME in the last 168 hours. CBC: Recent Labs  Lab 08/28/19 1935 08/31/19 0607  WBC 7.6 7.9  NEUTROABS 5.0  --   HGB 11.1* 9.0*  HCT 36.7* 28.7*  MCV 87.4 85.4  PLT 262 205   Cardiac Enzymes: No results for input(s): CKTOTAL, CKMB, CKMBINDEX, TROPONINI in the last 168 hours. BNP: Invalid input(s): POCBNP CBG: Recent Labs  Lab 08/30/19 1626 08/30/19 2012 08/31/19 0358  08/31/19 0744 08/31/19 1135  GLUCAP 161* 89 91 91 139*   HbA1C: No results for input(s): HGBA1C in the last 72 hours. Urine analysis:    Component Value Date/Time   COLORURINE STRAW (A) 08/30/2019 1109   APPEARANCEUR CLEAR 08/30/2019 1109   LABSPEC 1.011 08/30/2019 1109   PHURINE 7.0 08/30/2019 1109   GLUCOSEU NEGATIVE 08/30/2019 1109   HGBUR NEGATIVE 08/30/2019 1109   [place] 08/30/2019 1109   KETONESUR NEGATIVE 08/30/2019 1109   PROTEINUR 100 (A) 08/30/2019 1109   UROBILINOGEN 0.2 12/09/2010 1117   NITRITE NEGATIVE 08/30/2019 1109   LEUKOCYTESUR NEGATIVE 08/30/2019 1109   Sepsis Labs: @LABRCNTIP (procalcitonin:4,lacticidven:4) ) Recent Results (from the past 240 hour(s))  Urine culture     Status:  Abnormal   Collection Time: 08/28/19  6:48 PM   Specimen: Urine, Clean Catch  Result Value Ref Range Status   Specimen Description   Final    URINE, CLEAN CATCH Performed at [facility], [address], [place], [place] [address]    Special Requests   Final    NONE Performed at [facility], [address], [place], [place] [address]    Culture (A)  Final    >=100,000 COLONIES/mL GROUP B STREP(S.AGALACTIAE)ISOLATED TESTING AGAINST S. AGALACTIAE NOT ROUTINELY PERFORMED DUE TO PREDICTABILITY OF AMP/PEN/VAN SUSCEPTIBILITY. Performed at [facility], [place] [address], [place], [place] [address]    Report Status 08/30/2019 FINAL  Final  SARS Coronavirus 2 by RT PCR (hospital order, performed in [facility] hospital lab) Nasopharyngeal Nasopharyngeal Swab     Status: None   Collection Time: 08/28/19  6:49 PM   Specimen: Nasopharyngeal Swab  Result Value Ref Range Status   SARS Coronavirus 2 NEGATIVE NEGATIVE Final    Comment: (NOTE) SARS-CoV-2 target nucleic acids are NOT DETECTED. The SARS-CoV-2 RNA is generally detectable in upper and lower respiratory specimens during the acute phase of infection. The lowest concentration of SARS-CoV-2 viral copies this  assay can detect is 250 copies / mL. A negative result does not preclude SARS-CoV-2 infection and should not be used as the sole basis for treatment or other patient management decisions.  A negative result may occur with improper specimen collection / handling, submission of specimen other than nasopharyngeal swab, presence of viral mutation(s) within the areas targeted by this assay, and inadequate number of viral copies (<250 copies / mL). A negative result must be combined with clinical observations, patient history, and epidemiological information. Fact Sheet for Patients:   StrictlyIdeas.no Fact Sheet for Healthcare Providers: BankingDealers.co.za This test is not yet approved or cleared  by the [place] FDA and has been authorized for detection and/or diagnosis of SARS-CoV-2 by FDA under an Emergency Use Authorization (EUA).  This EUA will remain in effect (meaning this test can be used) for the duration of the COVID-19 declaration under Section 564(b)(1) of the Act, 21 U.S.C. section 360bbb-3(b)(1), unless the authorization is terminated or revoked sooner. Performed at [facility], [address], [place], [place] [address]   Culture, blood (routine x 2)     Status: None (Preliminary result)   Collection Time: 08/28/19  7:35 PM   Specimen: Right Antecubital; Blood  Result Value Ref Range Status   Specimen Description RIGHT ANTECUBITAL  Final   Special Requests   Final    BOTTLES DRAWN AEROBIC AND ANAEROBIC Blood Culture adequate volume   Culture   Final    NO GROWTH 3 DAYS Performed at [facility], [address], [place], [place] [address]    Report Status PENDING  Incomplete  Culture, blood (routine x 2)     Status: Abnormal   Collection Time: 08/28/19  7:35 PM   Specimen: BLOOD  Result Value Ref Range Status   Specimen Description   Final    BLOOD RIGHT ANTECUBITAL Performed at [facility], [place] [address], [place], [place] [address]    Special Requests   Final    BOTTLES DRAWN AEROBIC AND ANAEROBIC Blood Culture adequate volume Performed at [facility], [address] [place],  [address]    Culture  Setup Time   Final    GRAM POSITIVE COCCI AEROBIC AND ANAEROBIC BOTTLES Gram Stain Report Called to,Read Back By and Verified With:  PARSONS,L ON 08/29/19 AT 1830 BY LOY,C PERFORMED AT [facility] CRITICAL RESULT CALLED TO, READ BACK BY AND VERIFIED WITH: Coralie Common RN 08/29/19 2338 JDW    Culture (A)  Final    STAPHYLOCOCCUS SPECIES (COAGULASE NEGATIVE) THE SIGNIFICANCE OF ISOLATING THIS ORGANISM FROM A SINGLE SET OF BLOOD CULTURES WHEN MULTIPLE SETS ARE DRAWN IS UNCERTAIN. PLEASE NOTIFY THE MICROBIOLOGY DEPARTMENT WITHIN ONE WEEK IF SPECIATION AND SENSITIVITIES ARE REQUIRED. Performed at [facility], [place] [address] [place], [place] [address]    Report Status 08/31/2019 FINAL  Final  Blood Culture ID Panel (Reflexed)     Status: Abnormal   Collection Time: 08/28/19  7:35 PM  Result Value Ref Range Status   Enterococcus species NOT DETECTED NOT DETECTED Final   Listeria monocytogenes NOT DETECTED NOT DETECTED Final   Staphylococcus species DETECTED (A) NOT DETECTED Final    Comment: Methicillin (oxacillin) susceptible coagulase negative staphylococcus. Possible blood culture contaminant (unless isolated from more than one blood culture draw or clinical case suggests pathogenicity). No antibiotic treatment is indicated for blood  culture contaminants. CRITICAL RESULT CALLED TO, READ BACK BY AND VERIFIED WITH: Coralie Common RN 08/29/19 2338 JDW    Staphylococcus aureus (BCID) NOT DETECTED NOT DETECTED Final   Methicillin resistance NOT DETECTED NOT DETECTED Final   Streptococcus species NOT DETECTED NOT DETECTED Final   Streptococcus agalactiae NOT DETECTED NOT DETECTED Final   Streptococcus pneumoniae NOT DETECTED NOT DETECTED Final   Streptococcus pyogenes NOT DETECTED NOT DETECTED Final    Acinetobacter baumannii NOT DETECTED NOT DETECTED Final   Enterobacteriaceae species NOT DETECTED NOT DETECTED Final   Enterobacter cloacae complex NOT DETECTED NOT DETECTED Final   Escherichia coli NOT DETECTED NOT DETECTED Final   Klebsiella oxytoca NOT DETECTED NOT DETECTED Final   Klebsiella pneumoniae NOT DETECTED NOT DETECTED Final   Proteus species NOT DETECTED NOT DETECTED Final   Serratia marcescens NOT DETECTED NOT DETECTED Final   Haemophilus influenzae NOT DETECTED NOT DETECTED Final   Neisseria meningitidis NOT DETECTED NOT DETECTED Final   Pseudomonas aeruginosa NOT DETECTED NOT DETECTED Final   Candida albicans NOT DETECTED NOT DETECTED Final   Candida glabrata NOT DETECTED NOT DETECTED Final   Candida krusei NOT DETECTED NOT DETECTED Final   Candida parapsilosis NOT DETECTED NOT DETECTED Final   Candida tropicalis NOT DETECTED NOT DETECTED Final    Comment: Performed at [facility], [address] [place], [place] [address]  SARS Coronavirus 2 by RT PCR (hospital order, performed in [facility] hospital lab) Nasopharyngeal Nasopharyngeal Swab     Status: None   Collection Time: 08/31/19 12:23 PM   Specimen: Nasopharyngeal Swab  Result Value Ref Range Status   SARS Coronavirus 2 NEGATIVE NEGATIVE Final    Comment: (NOTE) SARS-CoV-2 target nucleic acids are NOT DETECTED. The SARS-CoV-2 RNA is generally detectable in upper and lower respiratory specimens during the acute phase of infection. The lowest concentration of SARS-CoV-2 viral copies this assay can detect is 250 copies / mL. A negative result does not preclude SARS-CoV-2 infection and should not be used as the sole basis for treatment or other patient management decisions.  A negative result may occur with improper specimen collection / handling, submission of specimen other than nasopharyngeal swab, presence of viral mutation(s) within the areas targeted by this assay, and inadequate number of viral  copies (<250 copies / mL). A negative result must be combined with clinical observations, patient history, and epidemiological information. Fact Sheet for  Patients:   StrictlyIdeas.no Fact Sheet for Healthcare Providers: BankingDealers.co.za This test is not yet approved or cleared  by the [place] FDA and has been authorized for detection and/or diagnosis of SARS-CoV-2 by FDA under an Emergency Use Authorization (EUA).  This EUA will remain in effect (meaning this test can be used) for the duration of the COVID-19 declaration under Section 564(b)(1) of the Act, 21 U.S.C. section 360bbb-3(b)(1), unless the authorization is terminated or revoked sooner. Performed at [facility], [address] [place], [place] [address]      Scheduled Meds: . amLODipine  10 mg Oral Daily   Continuous Infusions: . sodium chloride 75 mL/hr at 08/31/19 0135  . cefTRIAXone (ROCEPHIN)  IV 1 g (08/30/19 2138)    Procedures/Studies: US RENAL  Result Date: 08/29/2019 CLINICAL DATA:  Acute renal failure. EXAM: RENAL / URINARY TRACT ULTRASOUND COMPLETE COMPARISON:  None. FINDINGS: Right Kidney: Renal measurements: 10.3 x 4.8 x 5.2 cm = volume: 136 mL. Echogenic parenchyma. No cyst, mass, stone or hydronephrosis. Left Kidney: Left kidney could not be identified by the technologist because of regional intestinal gas. Bladder: Collapsed, bladder catheter in place. Other: None. IMPRESSION: Right kidney is normal size but shows abnormal echogenicity consistent with renal parenchymal disease. No obstruction. Left kidney could not be found by the technologist due to extensive regional bowel gas. Electronically Signed   By: Nelson Chimes M.D.   On: 08/29/2019 12:46   DG Chest Portable 1 View  Result Date: 08/28/2019 CLINICAL DATA:  Fever EXAM: PORTABLE CHEST 1 VIEW COMPARISON:  June 24, 2017 FINDINGS: The cardiomediastinal silhouette is enlarged. Aortic calcifications  are again noted. The lungs appear essentially clear. There is no pneumothorax or focal infiltrate. No significant pleural effusion. IMPRESSION: No active disease. Electronically Signed   By: Constance Holster M.D.   On: 08/28/2019 20:19    Orson Eva, DO  [facility]  If 7PM-7AM, please contact night-coverage [email or web address] Password [email or web address] 08/31/2019, 3:39 PM   LOS: 2 days

## 2019-08-31 NOTE — Progress Notes (Addendum)
Patient ID: Lucas [name], male   DOB: [birth year], 69 y.o.   MRN: [id number] S: Still not eating or drinking.  BP elevated last night and this am. O:BP (!) 198/69 (BP Location: Left Arm)   Pulse 67   Temp 98 F (36.7 C) (Oral)   Resp 16   Ht 5\' 5"  (1.651 m)   Wt 52.2 kg   SpO2 100%   BMI 19.14 kg/m   Intake/Output Summary (Last 24 hours) at 08/31/2019 1135 Last data filed at 08/31/2019 0900 Gross per 24 hour  Intake 480 ml  Output 1703 ml  Net -1223 ml   Intake/Output: I/O last 3 completed shifts: In: 609.9 [P.O.:360; I.V.:149.9; IV Piggyback:100] Out: 2953 [Urine:2951; Stool:2]  Intake/Output this shift:  Total I/O In: 120 [P.O.:120] Out: -  Weight change:  Gen: NAD CVS: RRR no rub Resp: cta Abd: +BS, soft, Nt/Nd Ext: no edema  Recent Labs  Lab 08/28/19 1935 08/29/19 0806 08/29/19 1513 08/30/19 0548 08/31/19 0607  NA 141 140 137 138 142  K 6.1* 5.5* 5.0 4.8 4.3  CL 105 105 104 107 108  CO2 27 24 23 22 22   GLUCOSE 108* 102* 110* 95 97  BUN 85* 79* 72* 69* 54*  CREATININE 4.05* 3.51* 3.66* 3.46* 2.71*  ALBUMIN 3.7 3.0*  --  3.0* 3.1*  CALCIUM 8.8* 8.6* 8.4* 8.4* 8.6*  PHOS  --  4.6  --  4.0 5.0*  AST 15  --   --   --   --   ALT 12  --   --   --   --    Liver Function Tests: Recent Labs  Lab 08/28/19 1935 08/28/19 1935 08/29/19 0806 08/30/19 0548 08/31/19 0607  AST 15  --   --   --   --   ALT 12  --   --   --   --   ALKPHOS 79  --   --   --   --   BILITOT 0.5  --   --   --   --   PROT 7.8  --   --   --   --   ALBUMIN 3.7   < > 3.0* 3.0* 3.1*   < > = values in this interval not displayed.   No results for input(s): LIPASE, AMYLASE in the last 168 hours. No results for input(s): AMMONIA in the last 168 hours. CBC: Recent Labs  Lab 08/28/19 1935 08/31/19 0607  WBC 7.6 7.9  NEUTROABS 5.0  --   HGB 11.1* 9.0*  HCT 36.7* 28.7*  MCV 87.4 85.4  PLT 262 205   Cardiac Enzymes: No results for input(s): CKTOTAL, CKMB, CKMBINDEX, TROPONINI in the  last 168 hours. CBG: Recent Labs  Lab 08/30/19 1106 08/30/19 1626 08/30/19 2012 08/31/19 0358 08/31/19 0744  GLUCAP 94 161* 89 91 91    Iron Studies:  Recent Labs    08/29/19 0443  IRON 57  TIBC 190*  FERRITIN 782*   Studies/Results: US RENAL  Result Date: 08/29/2019 CLINICAL DATA:  Acute renal failure. EXAM: RENAL / URINARY TRACT ULTRASOUND COMPLETE COMPARISON:  None. FINDINGS: Right Kidney: Renal measurements: 10.3 x 4.8 x 5.2 cm = volume: 136 mL. Echogenic parenchyma. No cyst, mass, stone or hydronephrosis. Left Kidney: Left kidney could not be identified by the technologist because of regional intestinal gas. Bladder: Collapsed, bladder catheter in place. Other: None. IMPRESSION: Right kidney is normal size but shows abnormal echogenicity consistent with renal parenchymal disease. No obstruction.  Left kidney could not be found by the technologist due to extensive regional bowel gas. Electronically Signed   By: Nelson Chimes M.D.   On: 08/29/2019 12:46   . sodium zirconium cyclosilicate  10 g Oral BID    BMET    Component Value Date/Time   NA 142 08/31/2019 0607   K 4.3 08/31/2019 0607   CL 108 08/31/2019 0607   CO2 22 08/31/2019 0607   GLUCOSE 97 08/31/2019 0607   BUN 54 (H) 08/31/2019 0607   CREATININE 2.71 (H) 08/31/2019 0607   CREATININE 0.99 08/22/2012 0948   CALCIUM 8.6 (L) 08/31/2019 0607   GFRNONAA 23 (L) 08/31/2019 0607   GFRNONAA 83 08/22/2012 0948   GFRAA 27 (L) 08/31/2019 0607   GFRAA >89 08/22/2012 0948   CBC    Component Value Date/Time   WBC 7.9 08/31/2019 0607   RBC 3.36 (L) 08/31/2019 0607   HGB 9.0 (L) 08/31/2019 0607   HCT 28.7 (L) 08/31/2019 0607   PLT 205 08/31/2019 0607   MCV 85.4 08/31/2019 0607   MCH 26.8 08/31/2019 0607   MCHC 31.4 08/31/2019 0607   RDW 15.9 (H) 08/31/2019 0607   LYMPHSABS 1.4 08/28/2019 1935   MONOABS 0.6 08/28/2019 1935   EOSABS 0.5 08/28/2019 1935   BASOSABS 0.0 08/28/2019 1935     Assessment/Plan: 1. AKI/CKD stage IV- Uncertain etiology. Possibly due to ischemic ATN/volume depletion vs BOO +/- infection. Cannot get history of NSAIDs or COX-II I's.  1. renal US without obstruction, renal function improving with IVF's. 2. BUN/Cr and UOP improving with IVF's 3. Continue with IV NS as he is not taking po and appears hypovolemic 4. Need to find out his MAR from SNF 5. Hold off on acute GN workup for now. 2. Hyperkalemia- due to #1. Improving with medical management. will stop lokelma and continue to follow. 3. ID- pt with h/o Fever- Tmax 99.4 this am. Urine cultures + for group B strep but blood cultures + for GPC in clusters, MSSE.  Started on ceftriaxone per primary but may want to narrow coverage.  Follow blood cultures and sensitivies 4. Hypoglycemia- on IV D10 per primary 5. Hypertensive urgency- on amlodipine with improvement. Need MAR from [facility]. 6. Anemia of CKD stage IV- Hgb was 11 but dropped to 9.  Continue to follow and check for occult blood loss. 7. CKD stage IV- presumably due to HTN but also has history of cocaine abuse and possible covid-19 infection in September 2020. 8. Chronic diastolic CHF- appears volume depleted on exam 9. FTT- noted on outside records for diagnosis but no office visits available for review 10. Moderate protein malnutrition- supplement and follow.  11. Disposition- recommend palliative care consult to help set goals/limits of care as he is DNR and has been having FTT with vascular dementia and progressive CKD.  Donetta Potts, MD [employer] [phone]

## 2019-09-01 ENCOUNTER — Inpatient Hospital Stay (HOSPITAL_COMMUNITY): Payer: Medicare Other

## 2019-09-01 DIAGNOSIS — G9341 Metabolic encephalopathy: Secondary | ICD-10-CM

## 2019-09-01 LAB — TSH: TSH: 1.316 u[IU]/mL (ref 0.350–4.500)

## 2019-09-01 LAB — URINALYSIS, COMPLETE (UACMP) WITH MICROSCOPIC
Bilirubin Urine: NEGATIVE
Glucose, UA: NEGATIVE mg/dL
Ketones, ur: NEGATIVE mg/dL
Leukocytes,Ua: NEGATIVE
Nitrite: NEGATIVE
Protein, ur: >=300 mg/dL — AB
Specific Gravity, Urine: 1.012 (ref 1.005–1.030)
pH: 5 (ref 5.0–8.0)

## 2019-09-01 LAB — CBC WITH DIFFERENTIAL/PLATELET
Abs Immature Granulocytes: 0.05 10*3/uL (ref 0.00–0.07)
Basophils Absolute: 0 10*3/uL (ref 0.0–0.1)
Basophils Relative: 0 %
Eosinophils Absolute: 0.4 10*3/uL (ref 0.0–0.5)
Eosinophils Relative: 4 %
HCT: 31.5 % — ABNORMAL LOW (ref 39.0–52.0)
Hemoglobin: 9.5 g/dL — ABNORMAL LOW (ref 13.0–17.0)
Immature Granulocytes: 1 %
Lymphocytes Relative: 21 %
Lymphs Abs: 1.9 10*3/uL (ref 0.7–4.0)
MCH: 25.7 pg — ABNORMAL LOW (ref 26.0–34.0)
MCHC: 30.2 g/dL (ref 30.0–36.0)
MCV: 85.4 fL (ref 80.0–100.0)
Monocytes Absolute: 0.8 10*3/uL (ref 0.1–1.0)
Monocytes Relative: 8 %
Neutro Abs: 6 10*3/uL (ref 1.7–7.7)
Neutrophils Relative %: 66 %
Platelets: 233 10*3/uL (ref 150–400)
RBC: 3.69 MIL/uL — ABNORMAL LOW (ref 4.22–5.81)
RDW: 15.7 % — ABNORMAL HIGH (ref 11.5–15.5)
WBC: 9.1 10*3/uL (ref 4.0–10.5)
nRBC: 0 % (ref 0.0–0.2)

## 2019-09-01 LAB — RENAL FUNCTION PANEL
Albumin: 3.2 g/dL — ABNORMAL LOW (ref 3.5–5.0)
Anion gap: 11 (ref 5–15)
BUN: 41 mg/dL — ABNORMAL HIGH (ref 8–23)
CO2: 23 mmol/L (ref 22–32)
Calcium: 8.7 mg/dL — ABNORMAL LOW (ref 8.9–10.3)
Chloride: 108 mmol/L (ref 98–111)
Creatinine, Ser: 2.32 mg/dL — ABNORMAL HIGH (ref 0.61–1.24)
GFR calc Af Amer: 33 mL/min — ABNORMAL LOW (ref >60)
GFR calc non Af Amer: 28 mL/min — ABNORMAL LOW (ref >60)
Glucose, Bld: 92 mg/dL (ref 70–99)
Phosphorus: 4.6 mg/dL (ref 2.5–4.6)
Potassium: 4.1 mmol/L (ref 3.5–5.1)
Sodium: 142 mmol/L (ref 135–145)

## 2019-09-01 LAB — VITAMIN B12: Vitamin B-12: 778 pg/mL (ref 180–914)

## 2019-09-01 LAB — GLUCOSE, CAPILLARY: Glucose-Capillary: 95 mg/dL (ref 70–99)

## 2019-09-01 LAB — T4, FREE: Free T4: 0.96 ng/dL (ref 0.61–1.12)

## 2019-09-01 LAB — FOLATE: Folate: 29.4 ng/mL (ref >5.9)

## 2019-09-01 MED ORDER — CEFTRIAXONE SODIUM 1 G IJ SOLR
1.0000 g | Freq: Once | INTRAMUSCULAR | Status: DC
  Filled 2019-09-01: qty 10

## 2019-09-01 MED ORDER — CLONIDINE HCL 0.1 MG PO TABS
0.1000 mg | ORAL_TABLET | Freq: Three times a day (TID) | ORAL | Status: DC
  Administered 2019-09-01 – 2019-09-03 (×7): 0.1 mg via ORAL
  Filled 2019-09-01 (×7): qty 1

## 2019-09-01 MED ORDER — HYDRALAZINE HCL 25 MG PO TABS
100.0000 mg | ORAL_TABLET | Freq: Three times a day (TID) | ORAL | Status: DC
  Administered 2019-09-01 – 2019-09-03 (×8): 100 mg via ORAL
  Filled 2019-09-01 (×8): qty 4

## 2019-09-01 MED ORDER — CLONIDINE HCL 0.2 MG/24HR TD PTWK
0.2000 mg | MEDICATED_PATCH | TRANSDERMAL | Status: DC
  Administered 2019-09-01: 0.2 mg via TRANSDERMAL
  Filled 2019-09-01: qty 1

## 2019-09-01 MED ORDER — CARVEDILOL 3.125 MG PO TABS
6.2500 mg | ORAL_TABLET | Freq: Two times a day (BID) | ORAL | Status: DC
  Administered 2019-09-01 – 2019-09-03 (×5): 6.25 mg via ORAL
  Filled 2019-09-01 (×5): qty 2

## 2019-09-01 NOTE — Progress Notes (Signed)
Patient ID: Lucas [name], male   DOB: Apr 14, 1950, 69 y.o.   MRN: [id number] S: Remains pleasantly confused, no complaints. O:BP (!) 190/88 (BP Location: Left Arm)   Pulse 80   Temp 99.3 F (37.4 C) (Oral)   Resp 18   Ht 5\' 5"  (1.651 m)   Wt 52.2 kg   SpO2 100%   BMI 19.14 kg/m   Intake/Output Summary (Last 24 hours) at 09/01/2019 1041 Last data filed at 09/01/2019 0900 Gross per 24 hour  Intake 240 ml  Output 2600 ml  Net -2360 ml   Intake/Output: I/O last 3 completed shifts: In: 360 [P.O.:360] Out: 4300 [Urine:4300]  Intake/Output this shift:  No intake/output data recorded. Weight change:  Gen: NAD CVS: no rub Resp: cta Abd: +BS, soft, Nt/Nd Ext: no edema  Recent Labs  Lab 08/28/19 1935 08/29/19 0806 08/29/19 1513 08/30/19 0548 08/31/19 0607 09/01/19 0352  NA 141 140 137 138 142 142  K 6.1* 5.5* 5.0 4.8 4.3 4.1  CL 105 105 104 107 108 108  CO2 27 24 23 22 22 23   GLUCOSE 108* 102* 110* 95 97 92  BUN 85* 79* 72* 69* 54* 41*  CREATININE 4.05* 3.51* 3.66* 3.46* 2.71* 2.32*  ALBUMIN 3.7 3.0*  --  3.0* 3.1* 3.2*  CALCIUM 8.8* 8.6* 8.4* 8.4* 8.6* 8.7*  PHOS  --  4.6  --  4.0 5.0* 4.6  AST 15  --   --   --   --   --   ALT 12  --   --   --   --   --    Liver Function Tests: Recent Labs  Lab 08/28/19 1935 08/29/19 0806 08/30/19 0548 08/31/19 0607 09/01/19 0352  AST 15  --   --   --   --   ALT 12  --   --   --   --   ALKPHOS 79  --   --   --   --   BILITOT 0.5  --   --   --   --   PROT 7.8  --   --   --   --   ALBUMIN 3.7   < > 3.0* 3.1* 3.2*   < > = values in this interval not displayed.   No results for input(s): LIPASE, AMYLASE in the last 168 hours. No results for input(s): AMMONIA in the last 168 hours. CBC: Recent Labs  Lab 08/28/19 1935 08/31/19 0607 09/01/19 0352  WBC 7.6 7.9 9.1  NEUTROABS 5.0  --  6.0  HGB 11.1* 9.0* 9.5*  HCT 36.7* 28.7* 31.5*  MCV 87.4 85.4 85.4  PLT 262 205 233   Cardiac Enzymes: No results for input(s): CKTOTAL,  CKMB, CKMBINDEX, TROPONINI in the last 168 hours. CBG: Recent Labs  Lab 08/30/19 2012 08/31/19 0358 08/31/19 0744 08/31/19 1135 08/31/19 1622  GLUCAP 89 91 91 139* 101*    Iron Studies: No results for input(s): IRON, TIBC, TRANSFERRIN, FERRITIN in the last 72 hours. Studies/Results: No results found. Marland Kitchen amLODipine  10 mg Oral Daily  . atorvastatin  40 mg Oral q1800  . clopidogrel  75 mg Oral Daily  . divalproex  500 mg Oral Daily  . gabapentin  100 mg Oral Daily  . hydrALAZINE  100 mg Oral Q8H  . mirtazapine  7.5 mg Oral QHS  . OLANZapine  2.5 mg Oral QHS  . sertraline  50 mg Oral Daily    BMET    Component Value  Date/Time   NA 142 09/01/2019 0352   K 4.1 09/01/2019 0352   CL 108 09/01/2019 0352   CO2 23 09/01/2019 0352   GLUCOSE 92 09/01/2019 0352   BUN 41 (H) 09/01/2019 0352   CREATININE 2.32 (H) 09/01/2019 0352   CREATININE 0.99 08/22/2012 0948   CALCIUM 8.7 (L) 09/01/2019 0352   GFRNONAA 28 (L) 09/01/2019 0352   GFRNONAA 83 08/22/2012 0948   GFRAA 33 (L) 09/01/2019 0352   GFRAA >89 08/22/2012 0948   CBC    Component Value Date/Time   WBC 9.1 09/01/2019 0352   RBC 3.69 (L) 09/01/2019 0352   HGB 9.5 (L) 09/01/2019 0352   HCT 31.5 (L) 09/01/2019 0352   PLT 233 09/01/2019 0352   MCV 85.4 09/01/2019 0352   MCH 25.7 (L) 09/01/2019 0352   MCHC 30.2 09/01/2019 0352   RDW 15.7 (H) 09/01/2019 0352   LYMPHSABS 1.9 09/01/2019 0352   MONOABS 0.8 09/01/2019 0352   EOSABS 0.4 09/01/2019 0352   BASOSABS 0.0 09/01/2019 0352    Assessment/Plan: 1. AKI/CKD stage IV- Uncertain etiology. Possibly due toischemic ATN/volume depletion vs BOO +/- infection. Cannot get history of NSAIDs or COX-II I's.  1. renal USwithout obstruction, renal function improving with IVF's. 2. BUN/Cr and UOP improved to baseline with IVF's.   3. Will stop IVF's and follow renal function. 4. Nothing further to add and will sign off.  Please call with any questions or concerns.  Will need  follow up with is pcp after discharge and will arrange for f/u with our office after discharge.  He is a poor candidate for HD given his poor functional status and behavioral issues. 2. Hyperkalemia- due to #1. Improving with medical management.will stop lokelma and continue to follow. 3. ID- pt with h/oFever-Tmax 99.4 this am. Urinecultures + for group B strep but blood cultures + for GPC in clusters, MSSE.Started on ceftriaxone per primarybut may want to narrow coverage. Follow blood cultures and sensitivies 4. Hypoglycemia- on IV D10 per primary 5. Hypertensive urgency- on amlodipine with improvement. Need MAR [facility]. 6. Anemia of CKD stage IV- Hgb was 11 but dropped to 9.  Continue to follow and check for occult blood loss. 7. CKD stage IV- presumably due to HTN but also has history of cocaine abuse and possible covid-19 infection in September 2020. 8. Chronic diastolic CHF- appears volume depleted on exam 9. FTT- noted on outside records for diagnosis but no office visits available for review 10. Moderate protein malnutrition- supplement and follow.  11. Disposition- recommend palliative care consult to help set goals/limits of care as he is DNR and has been having FTT with vascular dementia and progressive CKD.  Donetta Potts, MD [employer] [phone]

## 2019-09-01 NOTE — TOC Progression Note (Signed)
Transition of Care [facility]) - Progression Note    Patient Details  Name: QUASHON JESUS MRN: [id number] Date of Birth: 02/22/1951  Transition of Care [facility]) CM/SW Contact  Boneta Lucks, RN Phone Number: 09/01/2019, 4:22 PM  Clinical Narrative:   Patient from [facility] MD requesting Palliative Consult. Per Hildred Alamin at [facility] they use [facility] for Palliative and new program provided to them.  Referral made, Vito Backers will review on Monday.

## 2019-09-01 NOTE — Progress Notes (Signed)
PROGRESS NOTE  Lucas [name] MLJ:[id number] DOB: 1950-10-25 DOA: 08/28/2019 PCP: [name], MD  Brief History:  69 year old male with a history of vascular dementia, carotid stenosis, diastolic CHF, CKD stage IV, stroke, failure to thrive, hypertension, hyperlipidemia, GI bleed, schizoaffective disorder, cocaine use presenting from [facility] secondary to fever and abnormal blood work showing elevated serum creatinine.  The patient is unable to provide a significant history secondary to his dementia.  Review the records show that the patient has a serum creatinine of 4.52.  In the emergency department, the patient was awake and alert and able to answer simple questions, although he remained confused.  ED Course:Initial vital signs temperature 98 F, pulse 60, respirations 20, BP 190/73 mmHg and O2 sat 99% on room air.The case was discussed with on-call nephrology by Dr. [name]. He was given calcium gluconate, dextrose, 5 units of NovoLog IV, 10 g of Lokelma p.o. and a 500 mL NS bolus.  Urinalysis showed a cloudy appearance with glucosuria 50 and proteinuria 100 mg/dL. There was small hemoglobinuria, large leukocyte esterase, more than 50 WBC per hpf with rare bacteria. CBC shows a white count of 7.6, hemoglobin 11.1 g/dL platelets 262.CMP shows a potassium of 6.1 mmol/L. All other electrolytes are within normal range when calcium is corrected to albumin. LFTs are normal. Glucose 108, BUN 85, creatinine 4.05 mg/dL.   Assessment/Plan: Acute metabolic encephalopathy -Multifactorial including UTI, acute on chronic renal failure, electrolyte derangement, hypertensive encephalopathy, and medication effect -09/01/19--more somnolent, but follows one step commands -d/c potentially hypnotic meds including olanzapine, mirtazipine, sertraline, robaxin -MR brain -check ammonia -TSH--1.316 -Serum H21--975 -Folic OITG--54.9 -repeat UA and culture  Acute on chronic renal  failure--CKD stage IV -Baseline creatinine 2.7-3.0 -Serum creatinine peaked at 4.52 -Secondary to volume depletion with possible ATN -Renal ultrasound negative for hydronephrosis -Appreciate nephrology -Continue IV fluids>>>saline lock on 09/01/19 -FeNa= 3.59%  Acute lower UTI -UA shows > 50 WBC -Urine culture with group B streptococcus -continue ceftriaxone=  Bacteremia -CoNS one of two SETS -represents contaminant  Failure to thrive -Patient is only eating 25% of his meals -This appears to be the case even at [facility] -This will place him at high risk for recurrent acute on chronic renal failure -TSH--1.316 -Serum I26--415 -Folic AXEN--40.7  Hypertensive urgency -Restart clonidine TTS 2 patch, amlodipine, hydralazine -start coreg  Vascular dementia -Restart Plavix -discontinue Remeron, olanzapine, Depakote, sertraline due to more somnolence -hx of stroke with right hemiparesis  Hyperlipidemia -Restart statin  Anemia of CKD -Baseline hemoglobin 9-10  Hyperkalemia -Improved with temporizing measures and Lokelma      Status is: Inpatient  Remains inpatient appropriate because:IV treatments appropriate due to intensity of illness or inability to take PO;  Continues to have poor po intake   Dispo: The patient is from: SNF  Anticipated d/c is to: SNF  Anticipated d/c date is: 1-2 days  Patient currently is not medically stable to d/c. More somnolent         Family Communication:  No Family at bedside  Consultants:  renal  Code Status: DNR  DVT Prophylaxis:  [place] Heparin    Procedures: As Listed in Progress Note Above  Antibiotics: Ceftriaxone 5/18>>>        Subjective: Pt is somnolent, but arouses and answers questions.  Denies f/c cp, sob, n/v/d, abd pain.    Objective: Vitals:   08/31/19 2229 09/01/19 0529 09/01/19 1329 09/01/19 1358  BP: (!) 188/73 Marland Kitchen)  190/88  (!) 200/70  Pulse: 66 80  (!) 110  Resp:  18 17 18   Temp:  99.3 F (37.4 C) 100 F (37.8 C) 99.4 F (37.4 C)  TempSrc:  Oral Oral Oral  SpO2:  100%  100%  Weight:      Height:        Intake/Output Summary (Last 24 hours) at 09/01/2019 1741 Last data filed at 09/01/2019 1325 Gross per 24 hour  Intake 120 ml  Output 3250 ml  Net -3130 ml   Weight change:  Exam:   General:  Pt is alert, follows commands appropriately, not in acute distress  HEENT: No icterus, No thrush, No neck mass, [place]/AT  Cardiovascular: RRR, S1/S2, no rubs, no gallops  Respiratory: bibasilar rales. No wheeze  Abdomen: Soft/+BS, non tender, non distended, no guarding  Extremities: No edema, No lymphangitis, No petechiae, No rashes, no synovitis   Data Reviewed: I have personally reviewed following labs and imaging studies Basic Metabolic Panel: Recent Labs  Lab 08/28/19 1935 08/28/19 1935 08/29/19 0806 08/29/19 1513 08/30/19 0548 08/31/19 0607 09/01/19 0352  NA 141   < > 140 137 138 142 142  K 6.1*   < > 5.5* 5.0 4.8 4.3 4.1  CL 105   < > 105 104 107 108 108  CO2 27   < > 24 23 22 22 23   GLUCOSE 108*   < > 102* 110* 95 97 92  BUN 85*   < > 79* 72* 69* 54* 41*  CREATININE 4.05*   < > 3.51* 3.66* 3.46* 2.71* 2.32*  CALCIUM 8.8*   < > 8.6* 8.4* 8.4* 8.6* 8.7*  MG 3.0*  --   --   --   --   --   --   PHOS  --   --  4.6  --  4.0 5.0* 4.6   < > = values in this interval not displayed.   Liver Function Tests: Recent Labs  Lab 08/28/19 1935 08/29/19 0806 08/30/19 0548 08/31/19 0607 09/01/19 0352  AST 15  --   --   --   --   ALT 12  --   --   --   --   ALKPHOS 79  --   --   --   --   BILITOT 0.5  --   --   --   --   PROT 7.8  --   --   --   --   ALBUMIN 3.7 3.0* 3.0* 3.1* 3.2*   No results for input(s): LIPASE, AMYLASE in the last 168 hours. No results for input(s): AMMONIA in the last 168 hours. Coagulation Profile: No results for input(s): INR, PROTIME in the last 168  hours. CBC: Recent Labs  Lab 08/28/19 1935 08/31/19 0607 09/01/19 0352  WBC 7.6 7.9 9.1  NEUTROABS 5.0  --  6.0  HGB 11.1* 9.0* 9.5*  HCT 36.7* 28.7* 31.5*  MCV 87.4 85.4 85.4  PLT 262 205 233   Cardiac Enzymes: No results for input(s): CKTOTAL, CKMB, CKMBINDEX, TROPONINI in the last 168 hours. BNP: Invalid input(s): POCBNP CBG: Recent Labs  Lab 08/30/19 2012 08/31/19 0358 08/31/19 0744 08/31/19 1135 08/31/19 1622  GLUCAP 89 91 91 139* 101*   HbA1C: No results for input(s): HGBA1C in the last 72 hours. Urine analysis:    Component Value Date/Time   COLORURINE STRAW (A) 08/30/2019 1109   APPEARANCEUR CLEAR 08/30/2019 1109   LABSPEC 1.011 08/30/2019 1109   PHURINE 7.0 08/30/2019  [place] 08/30/2019 [place] 08/30/2019 [place] 08/30/2019 [place] 08/30/2019 1109   PROTEINUR 100 (A) 08/30/2019 1109   UROBILINOGEN 0.2 12/09/2010 1117   NITRITE NEGATIVE 08/30/2019 1109   LEUKOCYTESUR NEGATIVE 08/30/2019 1109   Sepsis Labs: @LABRCNTIP (procalcitonin:4,lacticidven:4) ) Recent Results (from the past 240 hour(s))  Urine culture     Status: Abnormal   Collection Time: 08/28/19  6:48 PM   Specimen: Urine, Clean Catch  Result Value Ref Range Status   Specimen Description   Final    URINE, CLEAN CATCH Performed at [facility], [address] [place], [place] [address]    Special Requests   Final    NONE Performed at [facility], [address], [place], [place] [address]    Culture (A)  Final    >=100,000 COLONIES/mL GROUP B STREP(S.AGALACTIAE)ISOLATED TESTING AGAINST S. AGALACTIAE NOT ROUTINELY PERFORMED DUE TO PREDICTABILITY OF AMP/PEN/VAN SUSCEPTIBILITY. Performed at [facility], [place] [address] [place], [place] [address]    Report Status 08/30/2019 FINAL  Final  SARS Coronavirus 2 by RT PCR (hospital order, performed in [facility] hospital lab) Nasopharyngeal Nasopharyngeal Swab      Status: None   Collection Time: 08/28/19  6:49 PM   Specimen: Nasopharyngeal Swab  Result Value Ref Range Status   SARS Coronavirus 2 NEGATIVE NEGATIVE Final    Comment: (NOTE) SARS-CoV-2 target nucleic acids are NOT DETECTED. The SARS-CoV-2 RNA is generally detectable in upper and lower respiratory specimens during the acute phase of infection. The lowest concentration of SARS-CoV-2 viral copies this assay can detect is 250 copies / mL. A negative result does not preclude SARS-CoV-2 infection and should not be used as the sole basis for treatment or other patient management decisions.  A negative result may occur with improper specimen collection / handling, submission of specimen other than nasopharyngeal swab, presence of viral mutation(s) within the areas targeted by this assay, and inadequate number of viral copies (<250 copies / mL). A negative result must be combined with clinical observations, patient history, and epidemiological information. Fact Sheet for Patients:   StrictlyIdeas.no Fact Sheet for Healthcare Providers: BankingDealers.co.za This test is not yet approved or cleared  by the [place] FDA and has been authorized for detection and/or diagnosis of SARS-CoV-2 by FDA under an Emergency Use Authorization (EUA).  This EUA will remain in effect (meaning this test can be used) for the duration of the COVID-19 declaration under Section 564(b)(1) of the Act, 21 U.S.C. section 360bbb-3(b)(1), unless the authorization is terminated or revoked sooner. Performed at [facility], [address], [place], [place] [address]   Culture, blood (routine x 2)     Status: None (Preliminary result)   Collection Time: 08/28/19  7:35 PM   Specimen: Right Antecubital; Blood  Result Value Ref Range Status   Specimen Description RIGHT ANTECUBITAL  Final   Special Requests   Final    BOTTLES DRAWN AEROBIC AND ANAEROBIC Blood Culture  adequate volume   Culture   Final    NO GROWTH 4 DAYS Performed at [facility], [address] [place],  [address]    Report Status PENDING  Incomplete  Culture, blood (routine x 2)     Status: Abnormal   Collection Time: 08/28/19  7:35 PM   Specimen: BLOOD  Result Value Ref Range Status   Specimen Description   Final    BLOOD RIGHT ANTECUBITAL Performed at [facility], [place] [address] [place], [place] [address]    Special Requests   Final    BOTTLES DRAWN AEROBIC AND ANAEROBIC Blood Culture adequate volume Performed at [facility], [address] [place], [place] [address]    Culture  Setup Time   Final    GRAM POSITIVE COCCI AEROBIC AND ANAEROBIC BOTTLES Gram Stain Report Called to,Read Back By and Verified With: PARSONS,L ON 08/29/19 AT 1830 BY LOY,C PERFORMED AT [facility] CRITICAL RESULT CALLED TO, READ BACK BY AND VERIFIED WITH: Coralie Common RN 08/29/19 2338 JDW    Culture (A)  Final    STAPHYLOCOCCUS SPECIES (COAGULASE NEGATIVE) THE SIGNIFICANCE OF ISOLATING THIS ORGANISM FROM A SINGLE SET OF BLOOD CULTURES WHEN MULTIPLE SETS ARE DRAWN IS UNCERTAIN. PLEASE NOTIFY THE MICROBIOLOGY DEPARTMENT WITHIN ONE WEEK IF SPECIATION AND SENSITIVITIES ARE REQUIRED. Performed at [facility], [place] [address], [place], [place] [address]    Report Status 08/31/2019 FINAL  Final  Blood Culture ID Panel (Reflexed)     Status: Abnormal   Collection Time: 08/28/19  7:35 PM  Result Value Ref Range Status   Enterococcus species NOT DETECTED NOT DETECTED Final   Listeria monocytogenes NOT DETECTED NOT DETECTED Final   Staphylococcus species DETECTED (A) NOT DETECTED Final    Comment: Methicillin (oxacillin) susceptible coagulase negative staphylococcus. Possible blood culture contaminant (unless isolated from more than one blood culture draw or clinical case suggests pathogenicity). No antibiotic treatment is indicated for blood  culture contaminants. CRITICAL RESULT CALLED TO, READ BACK  BY AND VERIFIED WITH: Coralie Common RN 08/29/19 2338 JDW    Staphylococcus aureus (BCID) NOT DETECTED NOT DETECTED Final   Methicillin resistance NOT DETECTED NOT DETECTED Final   Streptococcus species NOT DETECTED NOT DETECTED Final   Streptococcus agalactiae NOT DETECTED NOT DETECTED Final   Streptococcus pneumoniae NOT DETECTED NOT DETECTED Final   Streptococcus pyogenes NOT DETECTED NOT DETECTED Final   Acinetobacter baumannii NOT DETECTED NOT DETECTED Final   Enterobacteriaceae species NOT DETECTED NOT DETECTED Final   Enterobacter cloacae complex NOT DETECTED NOT DETECTED Final   Escherichia coli NOT DETECTED NOT DETECTED Final   Klebsiella oxytoca NOT DETECTED NOT DETECTED Final   Klebsiella pneumoniae NOT DETECTED NOT DETECTED Final   Proteus species NOT DETECTED NOT DETECTED Final   Serratia marcescens NOT DETECTED NOT DETECTED Final   Haemophilus influenzae NOT DETECTED NOT DETECTED Final   Neisseria meningitidis NOT DETECTED NOT DETECTED Final   Pseudomonas aeruginosa NOT DETECTED NOT DETECTED Final   Candida albicans NOT DETECTED NOT DETECTED Final   Candida glabrata NOT DETECTED NOT DETECTED Final   Candida krusei NOT DETECTED NOT DETECTED Final   Candida parapsilosis NOT DETECTED NOT DETECTED Final   Candida tropicalis NOT DETECTED NOT DETECTED Final    Comment: Performed at [facility], [address], [place],  [address]  SARS Coronavirus 2 by RT PCR (hospital order, performed in [facility] hospital lab) Nasopharyngeal Nasopharyngeal Swab     Status: None   Collection Time: 08/31/19 12:23 PM   Specimen: Nasopharyngeal Swab  Result Value Ref Range Status   SARS Coronavirus 2 NEGATIVE NEGATIVE Final    Comment: (NOTE) SARS-CoV-2 target nucleic acids are NOT DETECTED. The SARS-CoV-2 RNA is generally detectable in upper and lower respiratory specimens during the acute phase of infection. The lowest concentration of SARS-CoV-2 viral copies this assay can detect  is 250 copies / mL. A negative result does not preclude SARS-CoV-2 infection and should  not be used as the sole basis for treatment or other patient management decisions.  A negative result may occur with improper specimen collection / handling, submission of specimen other than nasopharyngeal swab, presence of viral mutation(s) within the areas targeted by this assay, and inadequate number of viral copies (<250 copies / mL). A negative result must be combined with clinical observations, patient history, and epidemiological information. Fact Sheet for Patients:   StrictlyIdeas.no Fact Sheet for Healthcare Providers: BankingDealers.co.za This test is not yet approved or cleared  by the [place] FDA and has been authorized for detection and/or diagnosis of SARS-CoV-2 by FDA under an Emergency Use Authorization (EUA).  This EUA will remain in effect (meaning this test can be used) for the duration of the COVID-19 declaration under Section 564(b)(1) of the Act, 21 U.S.C. section 360bbb-3(b)(1), unless the authorization is terminated or revoked sooner. Performed at [facility], [address], [place], [place] [address]      Scheduled Meds: . amLODipine  10 mg Oral Daily  . atorvastatin  40 mg Oral q1800  . carvedilol  6.25 mg Oral BID WC  . cloNIDine  0.2 mg Transdermal Weekly  . cloNIDine  0.1 mg Oral TID  . clopidogrel  75 mg Oral Daily  . divalproex  500 mg Oral Daily  . gabapentin  100 mg Oral Daily  . hydrALAZINE  100 mg Oral Q8H  . mirtazapine  7.5 mg Oral QHS  . OLANZapine  2.5 mg Oral QHS  . sertraline  50 mg Oral Daily   Continuous Infusions: . sodium chloride 10 mL/hr at 09/01/19 1047  . cefTRIAXone (ROCEPHIN)  IV 1 g (08/31/19 2115)    Procedures/Studies: US RENAL  Result Date: 08/29/2019 CLINICAL DATA:  Acute renal failure. EXAM: RENAL / URINARY TRACT ULTRASOUND COMPLETE COMPARISON:  None. FINDINGS: Right Kidney:  Renal measurements: 10.3 x 4.8 x 5.2 cm = volume: 136 mL. Echogenic parenchyma. No cyst, mass, stone or hydronephrosis. Left Kidney: Left kidney could not be identified by the technologist because of regional intestinal gas. Bladder: Collapsed, bladder catheter in place. Other: None. IMPRESSION: Right kidney is normal size but shows abnormal echogenicity consistent with renal parenchymal disease. No obstruction. Left kidney could not be found by the technologist due to extensive regional bowel gas. Electronically Signed   By: Nelson Chimes M.D.   On: 08/29/2019 12:46   DG Chest Portable 1 View  Result Date: 08/28/2019 CLINICAL DATA:  Fever EXAM: PORTABLE CHEST 1 VIEW COMPARISON:  June 24, 2017 FINDINGS: The cardiomediastinal silhouette is enlarged. Aortic calcifications are again noted. The lungs appear essentially clear. There is no pneumothorax or focal infiltrate. No significant pleural effusion. IMPRESSION: No active disease. Electronically Signed   By: Constance Holster M.D.   On: 08/28/2019 20:19    Orson Eva, DO  [facility]  If 7PM-7AM, please contact night-coverage [email or web address] Password [facility] 09/01/2019, 5:41 PM   LOS: 3 days

## 2019-09-01 NOTE — Care Management Important Message (Signed)
Important Message  Patient Details  Name: Lucas [name] MRN: [id number] Date of Birth: 11/15/1950   Medicare Important Message Given:  Yes     Tommy Medal 09/01/2019, 11:39 AM

## 2019-09-02 DIAGNOSIS — N179 Acute kidney failure, unspecified: Secondary | ICD-10-CM

## 2019-09-02 LAB — RENAL FUNCTION PANEL
Albumin: 3.5 g/dL (ref 3.5–5.0)
Anion gap: 15 (ref 5–15)
BUN: 40 mg/dL — ABNORMAL HIGH (ref 8–23)
CO2: 18 mmol/L — ABNORMAL LOW (ref 22–32)
Calcium: 9 mg/dL (ref 8.9–10.3)
Chloride: 109 mmol/L (ref 98–111)
Creatinine, Ser: 2.5 mg/dL — ABNORMAL HIGH (ref 0.61–1.24)
GFR calc Af Amer: 30 mL/min — ABNORMAL LOW (ref >60)
GFR calc non Af Amer: 26 mL/min — ABNORMAL LOW (ref >60)
Glucose, Bld: 79 mg/dL (ref 70–99)
Phosphorus: 4.6 mg/dL (ref 2.5–4.6)
Potassium: 4.3 mmol/L (ref 3.5–5.1)
Sodium: 142 mmol/L (ref 135–145)

## 2019-09-02 LAB — CBC WITH DIFFERENTIAL/PLATELET
Abs Immature Granulocytes: 0.06 10*3/uL (ref 0.00–0.07)
Basophils Absolute: 0 10*3/uL (ref 0.0–0.1)
Basophils Relative: 0 %
Eosinophils Absolute: 0.2 10*3/uL (ref 0.0–0.5)
Eosinophils Relative: 2 %
HCT: 33.9 % — ABNORMAL LOW (ref 39.0–52.0)
Hemoglobin: 10.3 g/dL — ABNORMAL LOW (ref 13.0–17.0)
Immature Granulocytes: 1 %
Lymphocytes Relative: 10 %
Lymphs Abs: 1.3 10*3/uL (ref 0.7–4.0)
MCH: 26 pg (ref 26.0–34.0)
MCHC: 30.4 g/dL (ref 30.0–36.0)
MCV: 85.6 fL (ref 80.0–100.0)
Monocytes Absolute: 0.7 10*3/uL (ref 0.1–1.0)
Monocytes Relative: 5 %
Neutro Abs: 10.6 10*3/uL — ABNORMAL HIGH (ref 1.7–7.7)
Neutrophils Relative %: 82 %
Platelets: 277 10*3/uL (ref 150–400)
RBC: 3.96 MIL/uL — ABNORMAL LOW (ref 4.22–5.81)
RDW: 15.9 % — ABNORMAL HIGH (ref 11.5–15.5)
WBC: 12.9 10*3/uL — ABNORMAL HIGH (ref 4.0–10.5)
nRBC: 0 % (ref 0.0–0.2)

## 2019-09-02 LAB — CULTURE, BLOOD (ROUTINE X 2)
Culture: NO GROWTH
Special Requests: ADEQUATE

## 2019-09-02 LAB — AMMONIA: Ammonia: 30 umol/L (ref 9–35)

## 2019-09-02 NOTE — Progress Notes (Signed)
PROGRESS NOTE  Lucas [name]:[id number] DOB: 08-Apr-1951 DOA: 08/28/2019 PCP: [name], MD   Brief History: 69 year old male with a history of vascular dementia, carotid stenosis, diastolic CHF, CKD stage IV, stroke, failure to thrive, hypertension, hyperlipidemia, GI bleed, schizoaffective disorder, cocaine use presenting from [facility] secondary to fever and abnormal blood work showing elevated serum creatinine. The patient is unable to provide a significant history secondary to his dementia. Review the records show that the patient has a serum creatinine of 4.52. In the emergency department, the patient was awake and alert and able to answer simple questions,although he remained confused.  ED Course:Initial vital signs temperature 98 F, pulse 60, respirations 20, BP 190/73 mmHg and O2 sat 99% on room air.The case was discussed with on-call nephrology by Dr. [name]. He was given calcium gluconate, dextrose, 5 units of NovoLog IV, 10 g of Lokelma p.o. and a 500 mL NS bolus.  Urinalysis showed a cloudy appearance with glucosuria 50 and proteinuria 100 mg/dL. There was small hemoglobinuria, large leukocyte esterase, more than 50 WBC per hpf with rare bacteria. CBC shows a white count of 7.6, hemoglobin 11.1 g/dL platelets 262.CMP shows a potassium of 6.1 mmol/L. All other electrolytes are within normal range when calcium is corrected to albumin. LFTs are normal. Glucose 108, BUN 85, creatinine 4.05 mg/dL.  Assessment/Plan: Acute metabolic encephalopathy -Multifactorial including UTI, acute on chronic renal failure, electrolyte derangement, hypertensive encephalopathy, and medication effect -09/01/19--more somnolent, but follows one step commands -d/c potentially hypnotic meds including olanzapine, mirtazipine, sertraline, robaxin -09/02/19--more alert, pleasantly confused -MR brain--neg for acute findings -check ammonia--30 -TSH--1.316 -Serum  W11--914 -Folic NWGN--56.2 -repeat UA--no significant pyuria  Acute on chronic renal failure--CKD stage IV -Baseline creatinine 2.7-3.0 -Serum creatinine peaked at 4.52 -Secondary to volume depletion with possible ATN -Renal ultrasound negative for hydronephrosis -Appreciate nephrology -Continue IV fluids>>>saline lock on 09/01/19 -FeNa= 3.59%  Acute lower UTI -UA shows> 50 WBC -Urine culture with group B streptococcus -continue ceftriaxone  Bacteremia -CoNS one of two SETS -represents contaminant  Failure to thrive -Patient is only eating 25% of his meals -This appears to be the case even at [facility] -This will place him at high risk for recurrent acute on chronic renal failure -TSH--1.316 -Serum Z30--865 -Folic HQIO--96.2  Hypertensive urgency -Restart clonidine TTS 2 patch, amlodipine, hydralazine -started coreg  Vascular dementia -Restarted Plavix -discontinue Remeron, olanzapine, Depakote, sertraline due to more somnolence -hx of stroke with right hemiparesis  Hyperlipidemia -Restart statin  Anemia of CKD -Baseline hemoglobin9-10  Hyperkalemia -Improved with temporizing measures and Lokelma      Status is: Inpatient  Remains inpatient appropriate because:IV treatments appropriate due to intensity of illness or inability to take PO; Continues to have poor po intake   Dispo: The patient is from:SNF Anticipated d/c is to:SNF Anticipated d/c date is: 1-2 days Patient currentlyis not medically stable to d/c. More somnolent         Family Communication:NoFamily at bedside  Consultants:renal  Code Status: DNR  DVT Prophylaxis: [place] Heparin    Procedures: As Listed in Progress Note Above  Antibiotics: Ceftriaxone 5/18>>>    Subjective: Patient denies fevers, chills, headache, chest pain, dyspnea, nausea, vomiting, diarrhea, abdominal pain, dysuria,  hematuria, hematochezia, and melena.   Objective: Vitals:   09/01/19 1746 09/01/19 1950 09/02/19 0614 09/02/19 1342  BP: 126/60 (!) 164/66 (!) 187/84 (!) 160/65  Pulse:  67 91 67  Resp:  16  18 19  Temp:  99.8 F (37.7 C) 99.2 F (37.3 C) 98.3 F (36.8 C)  TempSrc:  Oral Oral Oral  SpO2:   100% 100%  Weight:      Height:        Intake/Output Summary (Last 24 hours) at 09/02/2019 1705 Last data filed at 09/02/2019 1300 Gross per 24 hour  Intake 300 ml  Output 425 ml  Net -125 ml   Weight change:  Exam:   General:  Pt is alert, follows commands appropriately, not in acute distress  HEENT: No icterus, No thrush, No neck mass, [place]/AT  Cardiovascular: RRR, S1/S2, no rubs, no gallops  Respiratory: bibasilar rales. No wheeze  Abdomen: Soft/+BS, non tender, non distended, no guarding  Extremities: No edema, No lymphangitis, No petechiae, No rashes, no synovitis;  Contracted LE   Data Reviewed: I have personally reviewed following labs and imaging studies Basic Metabolic Panel: Recent Labs  Lab 08/28/19 1935 08/28/19 1935 08/29/19 0806 08/29/19 0806 08/29/19 1513 08/30/19 0548 08/31/19 0607 09/01/19 0352 09/02/19 0510  NA 141   < > 140   < > 137 138 142 142 142  K 6.1*   < > 5.5*   < > 5.0 4.8 4.3 4.1 4.3  CL 105   < > 105   < > 104 107 108 108 109  CO2 27   < > 24   < > 23 22 22 23  18*  GLUCOSE 108*   < > 102*   < > 110* 95 97 92 79  BUN 85*   < > 79*   < > 72* 69* 54* 41* 40*  CREATININE 4.05*   < > 3.51*   < > 3.66* 3.46* 2.71* 2.32* 2.50*  CALCIUM 8.8*   < > 8.6*   < > 8.4* 8.4* 8.6* 8.7* 9.0  MG 3.0*  --   --   --   --   --   --   --   --   PHOS  --   --  4.6  --   --  4.0 5.0* 4.6 4.6   < > = values in this interval not displayed.   Liver Function Tests: Recent Labs  Lab 08/28/19 1935 08/28/19 1935 08/29/19 0806 08/30/19 0548 08/31/19 0607 09/01/19 0352 09/02/19 0510  AST 15  --   --   --   --   --   --   ALT 12  --   --   --   --   --   --     ALKPHOS 79  --   --   --   --   --   --   BILITOT 0.5  --   --   --   --   --   --   PROT 7.8  --   --   --   --   --   --   ALBUMIN 3.7   < > 3.0* 3.0* 3.1* 3.2* 3.5   < > = values in this interval not displayed.   No results for input(s): LIPASE, AMYLASE in the last 168 hours. Recent Labs  Lab 09/02/19 0510  AMMONIA 30   Coagulation Profile: No results for input(s): INR, PROTIME in the last 168 hours. CBC: Recent Labs  Lab 08/28/19 1935 08/31/19 0607 09/01/19 0352 09/02/19 0510  WBC 7.6 7.9 9.1 12.9*  NEUTROABS 5.0  --  6.0 10.6*  HGB 11.1* 9.0* 9.5* 10.3*  HCT 36.7* 28.7*  31.5* 33.9*  MCV 87.4 85.4 85.4 85.6  PLT 262 205 233 277   Cardiac Enzymes: No results for input(s): CKTOTAL, CKMB, CKMBINDEX, TROPONINI in the last 168 hours. BNP: Invalid input(s): POCBNP CBG: Recent Labs  Lab 08/31/19 0358 08/31/19 0744 08/31/19 1135 08/31/19 1622 09/01/19 1849  GLUCAP 91 91 139* 101* 95   HbA1C: No results for input(s): HGBA1C in the last 72 hours. Urine analysis:    Component Value Date/Time   COLORURINE YELLOW 09/01/2019 1900   APPEARANCEUR CLEAR 09/01/2019 1900   LABSPEC 1.012 09/01/2019 1900   PHURINE 5.0 09/01/2019 1900   GLUCOSEU NEGATIVE 09/01/2019 1900   HGBUR SMALL (A) 09/01/2019 1900   BILIRUBINUR NEGATIVE 09/01/2019 1900   KETONESUR NEGATIVE 09/01/2019 1900   PROTEINUR >=300 (A) 09/01/2019 1900   UROBILINOGEN 0.2 12/09/2010 1117   NITRITE NEGATIVE 09/01/2019 1900   LEUKOCYTESUR NEGATIVE 09/01/2019 1900   Sepsis Labs: @LABRCNTIP (procalcitonin:4,lacticidven:4) ) Recent Results (from the past 240 hour(s))  Urine culture     Status: Abnormal   Collection Time: 08/28/19  6:48 PM   Specimen: Urine, Clean Catch  Result Value Ref Range Status   Specimen Description   Final    URINE, CLEAN CATCH Performed at [facility], [address], [place], [place] [address]    Special Requests   Final    NONE Performed at [facility], [address],  [place], [place] [address]    Culture (A)  Final    >=100,000 COLONIES/mL GROUP B STREP(S.AGALACTIAE)ISOLATED TESTING AGAINST S. AGALACTIAE NOT ROUTINELY PERFORMED DUE TO PREDICTABILITY OF AMP/PEN/VAN SUSCEPTIBILITY. Performed at [facility], [place] [address] [place], [place] [address]    Report Status 08/30/2019 FINAL  Final  SARS Coronavirus 2 by RT PCR (hospital order, performed in [facility] hospital lab) Nasopharyngeal Nasopharyngeal Swab     Status: None   Collection Time: 08/28/19  6:49 PM   Specimen: Nasopharyngeal Swab  Result Value Ref Range Status   SARS Coronavirus 2 NEGATIVE NEGATIVE Final    Comment: (NOTE) SARS-CoV-2 target nucleic acids are NOT DETECTED. The SARS-CoV-2 RNA is generally detectable in upper and lower respiratory specimens during the acute phase of infection. The lowest concentration of SARS-CoV-2 viral copies this assay can detect is 250 copies / mL. A negative result does not preclude SARS-CoV-2 infection and should not be used as the sole basis for treatment or other patient management decisions.  A negative result may occur with improper specimen collection / handling, submission of specimen other than nasopharyngeal swab, presence of viral mutation(s) within the areas targeted by this assay, and inadequate number of viral copies (<250 copies / mL). A negative result must be combined with clinical observations, patient history, and epidemiological information. Fact Sheet for Patients:   StrictlyIdeas.no Fact Sheet for Healthcare Providers: BankingDealers.co.za This test is not yet approved or cleared  by the [place] FDA and has been authorized for detection and/or diagnosis of SARS-CoV-2 by FDA under an Emergency Use Authorization (EUA).  This EUA will remain in effect (meaning this test can be used) for the duration of the COVID-19 declaration under Section 564(b)(1) of the Act, 21  U.S.C. section 360bbb-3(b)(1), unless the authorization is terminated or revoked sooner. Performed at [facility], [address], [place],  [address]   Culture, blood (routine x 2)     Status: None   Collection Time: 08/28/19  7:35 PM   Specimen: Right Antecubital; Blood  Result Value Ref Range Status   Specimen Description RIGHT  ANTECUBITAL  Final   Special Requests   Final    BOTTLES DRAWN AEROBIC AND ANAEROBIC Blood Culture adequate volume   Culture   Final    NO GROWTH 5 DAYS Performed at [facility], [address], [place], [place] [address]    Report Status 09/02/2019 FINAL  Final  Culture, blood (routine x 2)     Status: Abnormal   Collection Time: 08/28/19  7:35 PM   Specimen: BLOOD  Result Value Ref Range Status   Specimen Description   Final    BLOOD RIGHT ANTECUBITAL Performed at [facility], [place] [address] [place], [place] [address]    Special Requests   Final    BOTTLES DRAWN AEROBIC AND ANAEROBIC Blood Culture adequate volume Performed at [facility], [address] [place], [place] [address]    Culture  Setup Time   Final    GRAM POSITIVE COCCI AEROBIC AND ANAEROBIC BOTTLES Gram Stain Report Called to,Read Back By and Verified With: PARSONS,L ON 08/29/19 AT 1830 BY LOY,C PERFORMED AT [facility] CRITICAL RESULT CALLED TO, READ BACK BY AND VERIFIED WITH: Coralie Common RN 08/29/19 2338 JDW    Culture (A)  Final    STAPHYLOCOCCUS SPECIES (COAGULASE NEGATIVE) THE SIGNIFICANCE OF ISOLATING THIS ORGANISM FROM A SINGLE SET OF BLOOD CULTURES WHEN MULTIPLE SETS ARE DRAWN IS UNCERTAIN. PLEASE NOTIFY THE MICROBIOLOGY DEPARTMENT WITHIN ONE WEEK IF SPECIATION AND SENSITIVITIES ARE REQUIRED. Performed at [facility], [place] [address], [place], [place] [address]    Report Status 08/31/2019 FINAL  Final  Blood Culture ID Panel (Reflexed)     Status: Abnormal   Collection Time: 08/28/19  7:35 PM  Result Value Ref Range Status   Enterococcus species NOT DETECTED NOT  DETECTED Final   Listeria monocytogenes NOT DETECTED NOT DETECTED Final   Staphylococcus species DETECTED (A) NOT DETECTED Final    Comment: Methicillin (oxacillin) susceptible coagulase negative staphylococcus. Possible blood culture contaminant (unless isolated from more than one blood culture draw or clinical case suggests pathogenicity). No antibiotic treatment is indicated for blood  culture contaminants. CRITICAL RESULT CALLED TO, READ BACK BY AND VERIFIED WITH: Coralie Common RN 08/29/19 2338 JDW    Staphylococcus aureus (BCID) NOT DETECTED NOT DETECTED Final   Methicillin resistance NOT DETECTED NOT DETECTED Final   Streptococcus species NOT DETECTED NOT DETECTED Final   Streptococcus agalactiae NOT DETECTED NOT DETECTED Final   Streptococcus pneumoniae NOT DETECTED NOT DETECTED Final   Streptococcus pyogenes NOT DETECTED NOT DETECTED Final   Acinetobacter baumannii NOT DETECTED NOT DETECTED Final   Enterobacteriaceae species NOT DETECTED NOT DETECTED Final   Enterobacter cloacae complex NOT DETECTED NOT DETECTED Final   Escherichia coli NOT DETECTED NOT DETECTED Final   Klebsiella oxytoca NOT DETECTED NOT DETECTED Final   Klebsiella pneumoniae NOT DETECTED NOT DETECTED Final   Proteus species NOT DETECTED NOT DETECTED Final   Serratia marcescens NOT DETECTED NOT DETECTED Final   Haemophilus influenzae NOT DETECTED NOT DETECTED Final   Neisseria meningitidis NOT DETECTED NOT DETECTED Final   Pseudomonas aeruginosa NOT DETECTED NOT DETECTED Final   Candida albicans NOT DETECTED NOT DETECTED Final   Candida glabrata NOT DETECTED NOT DETECTED Final   Candida krusei NOT DETECTED NOT DETECTED Final   Candida parapsilosis NOT DETECTED NOT DETECTED Final   Candida tropicalis NOT DETECTED NOT DETECTED Final    Comment: Performed at [facility], [address] [place],  [address]  SARS Coronavirus 2 by RT  PCR (hospital order, performed in [facility] hospital lab) Nasopharyngeal  Nasopharyngeal Swab     Status: None   Collection Time: 08/31/19 12:23 PM   Specimen: Nasopharyngeal Swab  Result Value Ref Range Status   SARS Coronavirus 2 NEGATIVE NEGATIVE Final    Comment: (NOTE) SARS-CoV-2 target nucleic acids are NOT DETECTED. The SARS-CoV-2 RNA is generally detectable in upper and lower respiratory specimens during the acute phase of infection. The lowest concentration of SARS-CoV-2 viral copies this assay can detect is 250 copies / mL. A negative result does not preclude SARS-CoV-2 infection and should not be used as the sole basis for treatment or other patient management decisions.  A negative result may occur with improper specimen collection / handling, submission of specimen other than nasopharyngeal swab, presence of viral mutation(s) within the areas targeted by this assay, and inadequate number of viral copies (<250 copies / mL). A negative result must be combined with clinical observations, patient history, and epidemiological information. Fact Sheet for Patients:   StrictlyIdeas.no Fact Sheet for Healthcare Providers: BankingDealers.co.za This test is not yet approved or cleared  by the [place] FDA and has been authorized for detection and/or diagnosis of SARS-CoV-2 by FDA under an Emergency Use Authorization (EUA).  This EUA will remain in effect (meaning this test can be used) for the duration of the COVID-19 declaration under Section 564(b)(1) of the Act, 21 U.S.C. section 360bbb-3(b)(1), unless the authorization is terminated or revoked sooner. Performed at [facility], [address] [place], [place] [address]      Scheduled Meds: . amLODipine  10 mg Oral Daily  . atorvastatin  40 mg Oral q1800  . carvedilol  6.25 mg Oral BID WC  . cefTRIAXone (ROCEPHIN) IM  1 g Intramuscular Once  . cloNIDine  0.2 mg Transdermal Weekly  . cloNIDine  0.1 mg Oral TID  . clopidogrel  75 mg Oral Daily    . divalproex  500 mg Oral Daily  . hydrALAZINE  100 mg Oral Q8H   Continuous Infusions: . sodium chloride 10 mL/hr at 09/01/19 1047  . cefTRIAXone (ROCEPHIN)  IV 1 g (09/01/19 2355)    Procedures/Studies: MR BRAIN WO CONTRAST  Result Date: 09/01/2019 CLINICAL DATA:  69 year old male with encephalopathy. Fever and acute renal failure. EXAM: MRI HEAD WITHOUT CONTRAST TECHNIQUE: Multiplanar, multiecho pulse sequences of the brain and surrounding structures were obtained without intravenous contrast. COMPARISON:  Brain MRI 05/26/2017. Head CT 04/17/2018. FINDINGS: Brain: Chronic lacunar infarcts in the right superior cerebellum, bilateral pons, bilateral thalami, bilateral basal ganglia and corona radiata. Associated chronic microhemorrhages in those areas, and occasionally elsewhere. No superimposed restricted diffusion to suggest acute infarction. No midline shift, mass effect, evidence of mass lesion, ventriculomegaly, extra-axial collection or acute intracranial hemorrhage. Cervicomedullary junction and pituitary are within normal limits. Additional Patchy and confluent bilateral cerebral white matter T2 and FLAIR hyperintensity. No definite new signal abnormality since 2019. Vascular: Major intracranial vascular flow voids are stable since 2019. Skull and upper cervical spine: Negative visible cervical spine. Visualized bone marrow signal is within normal limits. Sinuses/Orbits: Negative orbits. Mild paranasal sinus mucosal thickening is stable. Other: Mastoids remain clear. Visible internal auditory structures appear normal. Small chronic left scalp benign lipoma is stable ( series 8, image 14). IMPRESSION: 1. No acute intracranial abnormality. 2. Chronically advanced small vessel disease without significant progression since 2019. Electronically Signed   By: Genevie Ann M.D.   On: 09/01/2019 18:41   US RENAL  Result Date: 08/29/2019  CLINICAL DATA:  Acute renal failure. EXAM: RENAL / URINARY TRACT  ULTRASOUND COMPLETE COMPARISON:  None. FINDINGS: Right Kidney: Renal measurements: 10.3 x 4.8 x 5.2 cm = volume: 136 mL. Echogenic parenchyma. No cyst, mass, stone or hydronephrosis. Left Kidney: Left kidney could not be identified by the technologist because of regional intestinal gas. Bladder: Collapsed, bladder catheter in place. Other: None. IMPRESSION: Right kidney is normal size but shows abnormal echogenicity consistent with renal parenchymal disease. No obstruction. Left kidney could not be found by the technologist due to extensive regional bowel gas. Electronically Signed   By: Nelson Chimes M.D.   On: 08/29/2019 12:46   DG Chest Portable 1 View  Result Date: 08/28/2019 CLINICAL DATA:  Fever EXAM: PORTABLE CHEST 1 VIEW COMPARISON:  June 24, 2017 FINDINGS: The cardiomediastinal silhouette is enlarged. Aortic calcifications are again noted. The lungs appear essentially clear. There is no pneumothorax or focal infiltrate. No significant pleural effusion. IMPRESSION: No active disease. Electronically Signed   By: Constance Holster M.D.   On: 08/28/2019 20:19    Orson Eva, DO  [facility]  If 7PM-7AM, please contact night-coverage [email or web address] Password [email or web address] 09/02/2019, 5:05 PM   LOS: 4 days

## 2019-09-03 DIAGNOSIS — I6529 Occlusion and stenosis of unspecified carotid artery: Secondary | ICD-10-CM | POA: Diagnosis not present

## 2019-09-03 DIAGNOSIS — F0281 Dementia in other diseases classified elsewhere with behavioral disturbance: Secondary | ICD-10-CM | POA: Diagnosis not present

## 2019-09-03 DIAGNOSIS — M25559 Pain in unspecified hip: Secondary | ICD-10-CM | POA: Diagnosis not present

## 2019-09-03 DIAGNOSIS — I639 Cerebral infarction, unspecified: Secondary | ICD-10-CM | POA: Diagnosis not present

## 2019-09-03 DIAGNOSIS — M79673 Pain in unspecified foot: Secondary | ICD-10-CM | POA: Diagnosis not present

## 2019-09-03 DIAGNOSIS — R5381 Other malaise: Secondary | ICD-10-CM | POA: Diagnosis not present

## 2019-09-03 DIAGNOSIS — E785 Hyperlipidemia, unspecified: Secondary | ICD-10-CM | POA: Diagnosis not present

## 2019-09-03 DIAGNOSIS — I69391 Dysphagia following cerebral infarction: Secondary | ICD-10-CM | POA: Diagnosis not present

## 2019-09-03 DIAGNOSIS — F331 Major depressive disorder, recurrent, moderate: Secondary | ICD-10-CM | POA: Diagnosis not present

## 2019-09-03 DIAGNOSIS — M25561 Pain in right knee: Secondary | ICD-10-CM | POA: Diagnosis not present

## 2019-09-03 DIAGNOSIS — I739 Peripheral vascular disease, unspecified: Secondary | ICD-10-CM | POA: Diagnosis not present

## 2019-09-03 DIAGNOSIS — R627 Adult failure to thrive: Secondary | ICD-10-CM | POA: Diagnosis not present

## 2019-09-03 DIAGNOSIS — R4182 Altered mental status, unspecified: Secondary | ICD-10-CM | POA: Diagnosis not present

## 2019-09-03 DIAGNOSIS — E875 Hyperkalemia: Secondary | ICD-10-CM | POA: Diagnosis not present

## 2019-09-03 DIAGNOSIS — N179 Acute kidney failure, unspecified: Secondary | ICD-10-CM | POA: Diagnosis not present

## 2019-09-03 DIAGNOSIS — R279 Unspecified lack of coordination: Secondary | ICD-10-CM | POA: Diagnosis not present

## 2019-09-03 DIAGNOSIS — N184 Chronic kidney disease, stage 4 (severe): Secondary | ICD-10-CM | POA: Diagnosis not present

## 2019-09-03 DIAGNOSIS — F39 Unspecified mood [affective] disorder: Secondary | ICD-10-CM | POA: Diagnosis not present

## 2019-09-03 DIAGNOSIS — I10 Essential (primary) hypertension: Secondary | ICD-10-CM | POA: Diagnosis not present

## 2019-09-03 DIAGNOSIS — I6601 Occlusion and stenosis of right middle cerebral artery: Secondary | ICD-10-CM | POA: Diagnosis not present

## 2019-09-03 DIAGNOSIS — I69351 Hemiplegia and hemiparesis following cerebral infarction affecting right dominant side: Secondary | ICD-10-CM | POA: Diagnosis not present

## 2019-09-03 DIAGNOSIS — F062 Psychotic disorder with delusions due to known physiological condition: Secondary | ICD-10-CM | POA: Diagnosis not present

## 2019-09-03 DIAGNOSIS — D631 Anemia in chronic kidney disease: Secondary | ICD-10-CM | POA: Diagnosis not present

## 2019-09-03 DIAGNOSIS — L89890 Pressure ulcer of other site, unstageable: Secondary | ICD-10-CM | POA: Diagnosis not present

## 2019-09-03 DIAGNOSIS — L98499 Non-pressure chronic ulcer of skin of other sites with unspecified severity: Secondary | ICD-10-CM | POA: Diagnosis not present

## 2019-09-03 DIAGNOSIS — Z7401 Bed confinement status: Secondary | ICD-10-CM | POA: Diagnosis not present

## 2019-09-03 DIAGNOSIS — I5032 Chronic diastolic (congestive) heart failure: Secondary | ICD-10-CM | POA: Diagnosis not present

## 2019-09-03 LAB — URINE CULTURE: Culture: NO GROWTH

## 2019-09-03 LAB — RENAL FUNCTION PANEL
Albumin: 3.3 g/dL — ABNORMAL LOW (ref 3.5–5.0)
Anion gap: 12 (ref 5–15)
BUN: 45 mg/dL — ABNORMAL HIGH (ref 8–23)
CO2: 20 mmol/L — ABNORMAL LOW (ref 22–32)
Calcium: 8.6 mg/dL — ABNORMAL LOW (ref 8.9–10.3)
Chloride: 109 mmol/L (ref 98–111)
Creatinine, Ser: 2.65 mg/dL — ABNORMAL HIGH (ref 0.61–1.24)
GFR calc Af Amer: 28 mL/min — ABNORMAL LOW (ref >60)
GFR calc non Af Amer: 24 mL/min — ABNORMAL LOW (ref >60)
Glucose, Bld: 90 mg/dL (ref 70–99)
Phosphorus: 5.2 mg/dL — ABNORMAL HIGH (ref 2.5–4.6)
Potassium: 4.1 mmol/L (ref 3.5–5.1)
Sodium: 141 mmol/L (ref 135–145)

## 2019-09-03 LAB — CBC WITH DIFFERENTIAL/PLATELET
Abs Immature Granulocytes: 0.04 10*3/uL (ref 0.00–0.07)
Basophils Absolute: 0 10*3/uL (ref 0.0–0.1)
Basophils Relative: 0 %
Eosinophils Absolute: 0.2 10*3/uL (ref 0.0–0.5)
Eosinophils Relative: 2 %
HCT: 30.3 % — ABNORMAL LOW (ref 39.0–52.0)
Hemoglobin: 9.3 g/dL — ABNORMAL LOW (ref 13.0–17.0)
Immature Granulocytes: 1 %
Lymphocytes Relative: 16 %
Lymphs Abs: 1.4 10*3/uL (ref 0.7–4.0)
MCH: 26.1 pg (ref 26.0–34.0)
MCHC: 30.7 g/dL (ref 30.0–36.0)
MCV: 84.9 fL (ref 80.0–100.0)
Monocytes Absolute: 0.7 10*3/uL (ref 0.1–1.0)
Monocytes Relative: 8 %
Neutro Abs: 6.4 10*3/uL (ref 1.7–7.7)
Neutrophils Relative %: 73 %
Platelets: 250 10*3/uL (ref 150–400)
RBC: 3.57 MIL/uL — ABNORMAL LOW (ref 4.22–5.81)
RDW: 16.1 % — ABNORMAL HIGH (ref 11.5–15.5)
WBC: 8.7 10*3/uL (ref 4.0–10.5)
nRBC: 0 % (ref 0.0–0.2)

## 2019-09-03 LAB — VALPROIC ACID LEVEL: Valproic Acid Lvl: 12 ug/mL — ABNORMAL LOW (ref 50.0–100.0)

## 2019-09-03 MED ORDER — SODIUM CHLORIDE 0.9 % IV BOLUS
1000.0000 mL | Freq: Once | INTRAVENOUS | Status: AC
  Administered 2019-09-03: 1000 mL via INTRAVENOUS

## 2019-09-03 MED ORDER — CARVEDILOL 6.25 MG PO TABS: 6.2500 mg | ORAL_TABLET | Freq: Two times a day (BID) | ORAL | Status: AC

## 2019-09-03 MED ORDER — NORCO 5-325 MG PO TABS: 0.5000 | ORAL_TABLET | Freq: Three times a day (TID) | ORAL | 0 refills | Status: DC

## 2019-09-03 NOTE — Discharge Summary (Signed)
Physician Discharge Summary  CIRE CLUTE TWS:[id number] DOB: Aug 13, 1950 DOA: 08/28/2019  PCP: [name], MD  Admit date: 08/28/2019 Discharge date: 09/03/2019  Admitted From: SNF Disposition:  SNF  Recommendations for Outpatient Follow-up:  1. Follow up with PCP in 1-2 weeks 2. Please obtain BMP/CBC in one week 3. Please assist patient with all meals 4. Encourage fluid intake daily   Discharge Condition: Stable CODE STATUS: DNR Diet recommendation:Regular   Brief/Interim Summary: 69 year old male with a history of vascular dementia, carotid stenosis, diastolic CHF, CKD stage IV, stroke, failure to thrive, hypertension, hyperlipidemia, GI bleed, schizoaffective disorder, cocaine use presenting from [facility] secondary to fever and abnormal blood work showing elevated serum creatinine. The patient is unable to provide a significant history secondary to his dementia. Review the records show that the patient has a serum creatinine of 4.52. In the emergency department, the patient was awake and alert and able to answer simple questions,although he remained confused.  He was started on ceftriaxone and IVF with clinical improvement.  His po intake remained poor, but would improve with assistance with meals.  He is at risk for recurrent dehydration.  Multiple attempts were made to reach patient's family without success to try to discuss [place].  ED Course:Initial vital signs temperature 98 F, pulse 60, respirations 20, BP 190/73 mmHg and O2 sat 99% on room air.The case was discussed with on-call nephrology by Dr. [name]. He was given calcium gluconate, dextrose, 5 units of NovoLog IV, 10 g of Lokelma p.o. and a 500 mL NS bolus.  Urinalysis showed a cloudy appearance with glucosuria 50 and proteinuria 100 mg/dL. There was small hemoglobinuria, large leukocyte esterase, more than 50 WBC per hpf with rare bacteria. CBC shows a white count of 7.6, hemoglobin 11.1 g/dL  platelets 262.CMP shows a potassium of 6.1 mmol/L. All other electrolytes are within normal range when calcium is corrected to albumin. LFTs are normal. Glucose 108, BUN 85, creatinine 4.05 mg/dL.   Discharge Diagnoses:  Acute metabolic encephalopathy -Multifactorial including UTI, acute on chronic renal failure, electrolyte derangement, hypertensive encephalopathy, and medication effect -09/01/19--more somnolent, but follows one step commands -d/c potentially hypnotic meds including olanzapine, mirtazipine, sertraline, robaxin -09/02/19--more alert, pleasantly confused--likely patient baseline -MR brain--neg for acute findings -check ammonia--30 -TSH--1.316 -Serum G01--749 -Folic SWHQ--75.9 -repeat UA--no significant pyuria  Acute on chronic renal failure--CKD stage IV -Baseline creatinine 2.7-3.0 -Serum creatinine peaked at 4.52 -Secondary to volume depletion with possible ATN -Renal ultrasound negative for hydronephrosis -Appreciate nephrology -Continue IV fluids>>>saline lock on 09/01/19 -FeNa= 3.59% -serum creatinine 2.65 on day of d/c  Acute lower UTI -UA shows> 50 WBC -Urine culture with group B streptococcus -continue ceftriaxone--finished 5 days  Bacteremia -CoNS one of two SETS -represents contaminant  Failure to thrive -Patient is only eating 25% of his meals -This appears to be the case even at [facility] -This will place him at high risk for recurrent acute on chronic renal failure -TSH--1.316 -Serum F63--846 -Folic KZLD--35.7  Hypertensive urgency -Restart clonidine TTS 2 patch, amlodipine, hydralazine -started coreg  Vascular dementia -Restarted Plavix -discontinueRemeron, olanzapine, Depakote, sertraline due to more somnolence -hx of stroke with right hemiparesis  Hyperlipidemia -Restart statin  Anemia of CKD -Baseline hemoglobin9-10  Hyperkalemia -Improved with temporizing measures and Lokelma    Discharge  Instructions   Allergies as of 09/03/2019      Reactions   Pork-derived Products Other (See Comments)   Patient does not eat pork products.      Medication  List    STOP taking these medications   Baclofen 5 MG Tabs   cloNIDine 0.2 mg/24hr patch Commonly known as: CATAPRES - Dosed in mg/24 hr   dexamethasone 4 MG tablet Commonly known as: DECADRON   gabapentin 100 MG capsule Commonly known as: NEURONTIN   OLANZapine 5 MG tablet Commonly known as: ZYPREXA   pantoprazole 40 MG tablet Commonly known as: Protonix   sertraline 25 MG tablet Commonly known as: ZOLOFT   sodium bicarbonate 650 MG tablet   vitamin C 250 MG tablet Commonly known as: ASCORBIC ACID     TAKE these medications   acetaminophen 325 MG tablet Commonly known as: TYLENOL Take 650 mg by mouth every 4 (four) hours as needed.   AMINO ACIDS-PROTEIN HYDROLYS PO Take 30 mLs by mouth daily.   amLODipine 10 MG tablet Commonly known as: NORVASC Take 1 tablet (10 mg total) by mouth daily. What changed: how much to take   atorvastatin 40 MG tablet Commonly known as: LIPITOR Take 1 tablet (40 mg total) by mouth daily at 6 PM.   Calmoseptine 0.44-20.6 % Oint Generic drug: Menthol-Zinc Oxide Apply 1 application topically daily.   carvedilol 6.25 MG tablet Commonly known as: COREG Take 1 tablet (6.25 mg total) by mouth 2 (two) times daily with a meal.   cholestyramine light 4 g packet Commonly known as: PREVALITE Take 1 packet by mouth daily.   cloNIDine 0.2 MG tablet Commonly known as: CATAPRES Take 1 tablet (0.2 mg total) by mouth 2 (two) times daily. What changed:   how much to take  when to take this  reasons to take this   cloNIDine 0.2 mg/24hr patch Commonly known as: CATAPRES - Dosed in mg/24 hr Place 0.2 mg onto the skin once a week.   clopidogrel 75 MG tablet Commonly known as: PLAVIX Take 1 tablet (75 mg total) by mouth daily.   divalproex 250 MG DR tablet Commonly known  as: DEPAKOTE Take 250 mg by mouth daily.   divalproex 125 MG capsule Commonly known as: DEPAKOTE SPRINKLE Take 500 mg by mouth daily.   hydrALAZINE 100 MG tablet Commonly known as: APRESOLINE Take 100 mg by mouth 3 (three) times daily.   Minerin Creme Crea Apply 1 application topically in the morning and at bedtime.   mirtazapine 7.5 MG tablet Commonly known as: REMERON Take 7.5 mg by mouth at bedtime.   Multiple Vit/Minerals/No Iron Tabs Take 1 tablet by mouth daily.   Norco 5-325 MG tablet Generic drug: HYDROcodone-acetaminophen Take 0.5 tablets by mouth 3 (three) times daily.   omeprazole 40 MG capsule Commonly known as: PRILOSEC Take 40 mg by mouth 2 (two) times daily.       Allergies  Allergen Reactions   Pork-Derived Products Other (See Comments)    Patient does not eat pork products.    Consultations:  Renal   Procedures/Studies: MR BRAIN WO CONTRAST  Result Date: 09/01/2019 CLINICAL DATA:  69 year old male with encephalopathy. Fever and acute renal failure. EXAM: MRI HEAD WITHOUT CONTRAST TECHNIQUE: Multiplanar, multiecho pulse sequences of the brain and surrounding structures were obtained without intravenous contrast. COMPARISON:  Brain MRI 05/26/2017. Head CT 04/17/2018. FINDINGS: Brain: Chronic lacunar infarcts in the right superior cerebellum, bilateral pons, bilateral thalami, bilateral basal ganglia and corona radiata. Associated chronic microhemorrhages in those areas, and occasionally elsewhere. No superimposed restricted diffusion to suggest acute infarction. No midline shift, mass effect, evidence of mass lesion, ventriculomegaly, extra-axial collection or acute intracranial hemorrhage. Cervicomedullary junction and  pituitary are within normal limits. Additional Patchy and confluent bilateral cerebral white matter T2 and FLAIR hyperintensity. No definite new signal abnormality since 2019. Vascular: Major intracranial vascular flow voids are stable  since 2019. Skull and upper cervical spine: Negative visible cervical spine. Visualized bone marrow signal is within normal limits. Sinuses/Orbits: Negative orbits. Mild paranasal sinus mucosal thickening is stable. Other: Mastoids remain clear. Visible internal auditory structures appear normal. Small chronic left scalp benign lipoma is stable ( series 8, image 14). IMPRESSION: 1. No acute intracranial abnormality. 2. Chronically advanced small vessel disease without significant progression since 2019. Electronically Signed   By: Genevie Ann M.D.   On: 09/01/2019 18:41   US RENAL  Result Date: 08/29/2019 CLINICAL DATA:  Acute renal failure. EXAM: RENAL / URINARY TRACT ULTRASOUND COMPLETE COMPARISON:  None. FINDINGS: Right Kidney: Renal measurements: 10.3 x 4.8 x 5.2 cm = volume: 136 mL. Echogenic parenchyma. No cyst, mass, stone or hydronephrosis. Left Kidney: Left kidney could not be identified by the technologist because of regional intestinal gas. Bladder: Collapsed, bladder catheter in place. Other: None. IMPRESSION: Right kidney is normal size but shows abnormal echogenicity consistent with renal parenchymal disease. No obstruction. Left kidney could not be found by the technologist due to extensive regional bowel gas. Electronically Signed   By: Nelson Chimes M.D.   On: 08/29/2019 12:46   DG Chest Portable 1 View  Result Date: 08/28/2019 CLINICAL DATA:  Fever EXAM: PORTABLE CHEST 1 VIEW COMPARISON:  June 24, 2017 FINDINGS: The cardiomediastinal silhouette is enlarged. Aortic calcifications are again noted. The lungs appear essentially clear. There is no pneumothorax or focal infiltrate. No significant pleural effusion. IMPRESSION: No active disease. Electronically Signed   By: Constance Holster M.D.   On: 08/28/2019 20:19         Discharge Exam: Vitals:   09/03/19 0432 09/03/19 0752  BP: (!) 117/50 (!) 146/66  Pulse: 62 64  Resp: 16   Temp: 98.9 F (37.2 C)   SpO2: 98%    Vitals:    09/02/19 1342 09/02/19 2041 09/03/19 0432 09/03/19 0752  BP: (!) 160/65 (!) 158/62 (!) 117/50 (!) 146/66  Pulse: 67 (!) 50 62 64  Resp: 19 16 16    Temp: 98.3 F (36.8 C) 98.8 F (37.1 C) 98.9 F (37.2 C)   TempSrc: Oral Oral Oral   SpO2: 100%  98%   Weight:      Height:        General: Pt is alert, awake, not in acute distress Cardiovascular: RRR, S1/S2 +, no rubs, no gallops Respiratory: bibasilar rales. No wheeze Abdominal: Soft, NT, ND, bowel sounds + Extremities: no edema, no cyanosis   The results of significant diagnostics from this hospitalization (including imaging, microbiology, ancillary and laboratory) are listed below for reference.    Significant Diagnostic Studies: MR BRAIN WO CONTRAST  Result Date: 09/01/2019 CLINICAL DATA:  68 year old male with encephalopathy. Fever and acute renal failure. EXAM: MRI HEAD WITHOUT CONTRAST TECHNIQUE: Multiplanar, multiecho pulse sequences of the brain and surrounding structures were obtained without intravenous contrast. COMPARISON:  Brain MRI 05/26/2017. Head CT 04/17/2018. FINDINGS: Brain: Chronic lacunar infarcts in the right superior cerebellum, bilateral pons, bilateral thalami, bilateral basal ganglia and corona radiata. Associated chronic microhemorrhages in those areas, and occasionally elsewhere. No superimposed restricted diffusion to suggest acute infarction. No midline shift, mass effect, evidence of mass lesion, ventriculomegaly, extra-axial collection or acute intracranial hemorrhage. Cervicomedullary junction and pituitary are within normal limits. Additional Patchy and confluent bilateral  cerebral white matter T2 and FLAIR hyperintensity. No definite new signal abnormality since 2019. Vascular: Major intracranial vascular flow voids are stable since 2019. Skull and upper cervical spine: Negative visible cervical spine. Visualized bone marrow signal is within normal limits. Sinuses/Orbits: Negative orbits. Mild paranasal sinus  mucosal thickening is stable. Other: Mastoids remain clear. Visible internal auditory structures appear normal. Small chronic left scalp benign lipoma is stable ( series 8, image 14). IMPRESSION: 1. No acute intracranial abnormality. 2. Chronically advanced small vessel disease without significant progression since 2019. Electronically Signed   By: Genevie Ann M.D.   On: 09/01/2019 18:41   US RENAL  Result Date: 08/29/2019 CLINICAL DATA:  Acute renal failure. EXAM: RENAL / URINARY TRACT ULTRASOUND COMPLETE COMPARISON:  None. FINDINGS: Right Kidney: Renal measurements: 10.3 x 4.8 x 5.2 cm = volume: 136 mL. Echogenic parenchyma. No cyst, mass, stone or hydronephrosis. Left Kidney: Left kidney could not be identified by the technologist because of regional intestinal gas. Bladder: Collapsed, bladder catheter in place. Other: None. IMPRESSION: Right kidney is normal size but shows abnormal echogenicity consistent with renal parenchymal disease. No obstruction. Left kidney could not be found by the technologist due to extensive regional bowel gas. Electronically Signed   By: Nelson Chimes M.D.   On: 08/29/2019 12:46   DG Chest Portable 1 View  Result Date: 08/28/2019 CLINICAL DATA:  Fever EXAM: PORTABLE CHEST 1 VIEW COMPARISON:  June 24, 2017 FINDINGS: The cardiomediastinal silhouette is enlarged. Aortic calcifications are again noted. The lungs appear essentially clear. There is no pneumothorax or focal infiltrate. No significant pleural effusion. IMPRESSION: No active disease. Electronically Signed   By: Constance Holster M.D.   On: 08/28/2019 20:19     Microbiology: Recent Results (from the past 240 hour(s))  Urine culture     Status: Abnormal   Collection Time: 08/28/19  6:48 PM   Specimen: Urine, Clean Catch  Result Value Ref Range Status   Specimen Description   Final    URINE, CLEAN CATCH Performed at [facility], [address], [place], [place] [address]    Special Requests   Final     NONE Performed at [facility], [address] [place], [place] [address]    Culture (A)  Final    >=100,000 COLONIES/mL GROUP B STREP(S.AGALACTIAE)ISOLATED TESTING AGAINST S. AGALACTIAE NOT ROUTINELY PERFORMED DUE TO PREDICTABILITY OF AMP/PEN/VAN SUSCEPTIBILITY. Performed at [facility], [place] [address], [place], [place] [address]    Report Status 08/30/2019 FINAL  Final  SARS Coronavirus 2 by RT PCR (hospital order, performed in [facility] hospital lab) Nasopharyngeal Nasopharyngeal Swab     Status: None   Collection Time: 08/28/19  6:49 PM   Specimen: Nasopharyngeal Swab  Result Value Ref Range Status   SARS Coronavirus 2 NEGATIVE NEGATIVE Final    Comment: (NOTE) SARS-CoV-2 target nucleic acids are NOT DETECTED. The SARS-CoV-2 RNA is generally detectable in upper and lower respiratory specimens during the acute phase of infection. The lowest concentration of SARS-CoV-2 viral copies this assay can detect is 250 copies / mL. A negative result does not preclude SARS-CoV-2 infection and should not be used as the sole basis for treatment or other patient management decisions.  A negative result may occur with improper specimen collection / handling, submission of specimen other than nasopharyngeal swab, presence of viral mutation(s) within the areas targeted by this assay, and inadequate number of viral copies (<250 copies / mL). A negative result must be combined with  clinical observations, patient history, and epidemiological information. Fact Sheet for Patients:   StrictlyIdeas.no Fact Sheet for Healthcare Providers: BankingDealers.co.za This test is not yet approved or cleared  by the [place] FDA and has been authorized for detection and/or diagnosis of SARS-CoV-2 by FDA under an Emergency Use Authorization (EUA).  This EUA will remain in effect (meaning this test can be used) for the duration of the COVID-19 declaration  under Section 564(b)(1) of the Act, 21 U.S.C. section 360bbb-3(b)(1), unless the authorization is terminated or revoked sooner. Performed at [facility], [address], [place], [place] [address]   Culture, blood (routine x 2)     Status: None   Collection Time: 08/28/19  7:35 PM   Specimen: Right Antecubital; Blood  Result Value Ref Range Status   Specimen Description RIGHT ANTECUBITAL  Final   Special Requests   Final    BOTTLES DRAWN AEROBIC AND ANAEROBIC Blood Culture adequate volume   Culture   Final    NO GROWTH 5 DAYS Performed at [facility], [address], [place], [place] [address]    Report Status 09/02/2019 FINAL  Final  Culture, blood (routine x 2)     Status: Abnormal   Collection Time: 08/28/19  7:35 PM   Specimen: BLOOD  Result Value Ref Range Status   Specimen Description   Final    BLOOD RIGHT ANTECUBITAL Performed at [facility], [place] [address] [place], [place] [address]    Special Requests   Final    BOTTLES DRAWN AEROBIC AND ANAEROBIC Blood Culture adequate volume Performed at [facility], [address], [place], [place] [address]    Culture  Setup Time   Final    GRAM POSITIVE COCCI AEROBIC AND ANAEROBIC BOTTLES Gram Stain Report Called to,Read Back By and Verified With: PARSONS,L ON 08/29/19 AT 1830 BY LOY,C PERFORMED AT [facility] CRITICAL RESULT CALLED TO, READ BACK BY AND VERIFIED WITH: Coralie Common RN 08/29/19 2338 JDW    Culture (A)  Final    STAPHYLOCOCCUS SPECIES (COAGULASE NEGATIVE) THE SIGNIFICANCE OF ISOLATING THIS ORGANISM FROM A SINGLE SET OF BLOOD CULTURES WHEN MULTIPLE SETS ARE DRAWN IS UNCERTAIN. PLEASE NOTIFY THE MICROBIOLOGY DEPARTMENT WITHIN ONE WEEK IF SPECIATION AND SENSITIVITIES ARE REQUIRED. Performed at [facility], [place] [address] [place], [place] [address]    Report Status 08/31/2019 FINAL  Final  Blood Culture ID Panel (Reflexed)     Status: Abnormal   Collection Time: 08/28/19  7:35 PM  Result Value Ref Range Status    Enterococcus species NOT DETECTED NOT DETECTED Final   Listeria monocytogenes NOT DETECTED NOT DETECTED Final   Staphylococcus species DETECTED (A) NOT DETECTED Final    Comment: Methicillin (oxacillin) susceptible coagulase negative staphylococcus. Possible blood culture contaminant (unless isolated from more than one blood culture draw or clinical case suggests pathogenicity). No antibiotic treatment is indicated for blood  culture contaminants. CRITICAL RESULT CALLED TO, READ BACK BY AND VERIFIED WITH: Coralie Common RN 08/29/19 2338 JDW    Staphylococcus aureus (BCID) NOT DETECTED NOT DETECTED Final   Methicillin resistance NOT DETECTED NOT DETECTED Final   Streptococcus species NOT DETECTED NOT DETECTED Final   Streptococcus agalactiae NOT DETECTED NOT DETECTED Final   Streptococcus pneumoniae NOT DETECTED NOT DETECTED Final   Streptococcus pyogenes NOT DETECTED NOT DETECTED Final   Acinetobacter baumannii NOT DETECTED NOT DETECTED Final   Enterobacteriaceae species NOT DETECTED NOT DETECTED Final   Enterobacter cloacae complex NOT DETECTED NOT DETECTED Final  Escherichia coli NOT DETECTED NOT DETECTED Final   Klebsiella oxytoca NOT DETECTED NOT DETECTED Final   Klebsiella pneumoniae NOT DETECTED NOT DETECTED Final   Proteus species NOT DETECTED NOT DETECTED Final   Serratia marcescens NOT DETECTED NOT DETECTED Final   Haemophilus influenzae NOT DETECTED NOT DETECTED Final   Neisseria meningitidis NOT DETECTED NOT DETECTED Final   Pseudomonas aeruginosa NOT DETECTED NOT DETECTED Final   Candida albicans NOT DETECTED NOT DETECTED Final   Candida glabrata NOT DETECTED NOT DETECTED Final   Candida krusei NOT DETECTED NOT DETECTED Final   Candida parapsilosis NOT DETECTED NOT DETECTED Final   Candida tropicalis NOT DETECTED NOT DETECTED Final    Comment: Performed at [facility], [place] [address], [place], [place] [address]  SARS Coronavirus 2 by RT PCR (hospital order, performed in  [facility] hospital lab) Nasopharyngeal Nasopharyngeal Swab     Status: None   Collection Time: 08/31/19 12:23 PM   Specimen: Nasopharyngeal Swab  Result Value Ref Range Status   SARS Coronavirus 2 NEGATIVE NEGATIVE Final    Comment: (NOTE) SARS-CoV-2 target nucleic acids are NOT DETECTED. The SARS-CoV-2 RNA is generally detectable in upper and lower respiratory specimens during the acute phase of infection. The lowest concentration of SARS-CoV-2 viral copies this assay can detect is 250 copies / mL. A negative result does not preclude SARS-CoV-2 infection and should not be used as the sole basis for treatment or other patient management decisions.  A negative result may occur with improper specimen collection / handling, submission of specimen other than nasopharyngeal swab, presence of viral mutation(s) within the areas targeted by this assay, and inadequate number of viral copies (<250 copies / mL). A negative result must be combined with clinical observations, patient history, and epidemiological information. Fact Sheet for Patients:   StrictlyIdeas.no Fact Sheet for Healthcare Providers: BankingDealers.co.za This test is not yet approved or cleared  by the [place] FDA and has been authorized for detection and/or diagnosis of SARS-CoV-2 by FDA under an Emergency Use Authorization (EUA).  This EUA will remain in effect (meaning this test can be used) for the duration of the COVID-19 declaration under Section 564(b)(1) of the Act, 21 U.S.C. section 360bbb-3(b)(1), unless the authorization is terminated or revoked sooner. Performed at [facility], [address], [place], [place] [address]   Culture, Urine     Status: None   Collection Time: 09/01/19  7:00 PM   Specimen: Urine, Clean Catch  Result Value Ref Range Status   Specimen Description   Final    URINE, CLEAN CATCH Performed at [facility], [address]  [place], [place] [address]    Special Requests   Final    NONE Performed at [facility], [address], [place], [place] [address]    Culture   Final    NO GROWTH Performed at [facility], [place] [address] [place], [place] [address]    Report Status 09/03/2019 FINAL  Final     Labs: Basic Metabolic Panel: Recent Labs  Lab 08/28/19 1935 08/29/19 0806 08/30/19 0548 08/30/19 0548 08/31/19 0607 08/31/19 0607 09/01/19 0352 09/01/19 0352 09/02/19 0510 09/03/19 0557  NA 141   < > 138  --  142  --  142  --  142 141  K 6.1*   < > 4.8   < > 4.3   < > 4.1   < > 4.3 4.1  CL 105   < > 107  --  108  --  108  --  109 109  CO2 27   < > 22  --  22  --  23  --  18* 20*  GLUCOSE 108*   < > 95  --  97  --  92  --  79 90  BUN 85*   < > 69*  --  54*  --  41*  --  40* 45*  CREATININE 4.05*   < > 3.46*  --  2.71*  --  2.32*  --  2.50* 2.65*  CALCIUM 8.8*   < > 8.4*  --  8.6*  --  8.7*  --  9.0 8.6*  MG 3.0*  --   --   --   --   --   --   --   --   --   PHOS  --    < > 4.0  --  5.0*  --  4.6  --  4.6 5.2*   < > = values in this interval not displayed.   Liver Function Tests: Recent Labs  Lab 08/28/19 1935 08/29/19 0806 08/30/19 0548 08/31/19 0607 09/01/19 0352 09/02/19 0510 09/03/19 0557  AST 15  --   --   --   --   --   --   ALT 12  --   --   --   --   --   --   ALKPHOS 79  --   --   --   --   --   --   BILITOT 0.5  --   --   --   --   --   --   PROT 7.8  --   --   --   --   --   --   ALBUMIN 3.7   < > 3.0* 3.1* 3.2* 3.5 3.3*   < > = values in this interval not displayed.   No results for input(s): LIPASE, AMYLASE in the last 168 hours. Recent Labs  Lab 09/02/19 0510  AMMONIA 30   CBC: Recent Labs  Lab 08/28/19 1935 08/31/19 0607 09/01/19 0352 09/02/19 0510 09/03/19 0557  WBC 7.6 7.9 9.1 12.9* 8.7  NEUTROABS 5.0  --  6.0 10.6* 6.4  HGB 11.1* 9.0* 9.5* 10.3* 9.3*  HCT 36.7* 28.7* 31.5* 33.9* 30.3*  MCV 87.4 85.4 85.4 85.6 84.9  PLT 262 205 233 277 250   Cardiac  Enzymes: No results for input(s): CKTOTAL, CKMB, CKMBINDEX, TROPONINI in the last 168 hours. BNP: Invalid input(s): POCBNP CBG: Recent Labs  Lab 08/31/19 0358 08/31/19 0744 08/31/19 1135 08/31/19 1622 09/01/19 1849  GLUCAP 91 91 139* 101* 95    Time coordinating discharge:  36 minutes  Signed:  Orson Eva, DO [facility] Pager: [phone] 09/03/2019, 12:22 PM

## 2019-09-03 NOTE — Progress Notes (Signed)
Patient's IV has been removed. Nursing report given to Collet[facility] [place] [facility], with no furthur questions. EMS has been notified of patient's transport to [facility].

## 2019-09-03 NOTE — TOC Progression Note (Signed)
Transition of Care [facility]) - Progression Note    Patient Details  Name: Lucas [name] MRN: [id number] Date of Birth: Sep 10, 1950  Transition of Care [facility]) CM/SW [place], LCSW Phone Number: 09/03/2019, 12:55 PM  Clinical Narrative:   CSW in contact with [facility] to inquire if patient could transition back to facility while being followed by palliative care today. CSW spoke with supervisor on duty who states that she is looking into paper work for the patient and shall return my call shortly.   TOC team will continue to follow patient for discharge related needs  [place] Transitions of Care  Clinical Social Worker  Ph: [phone]         Expected Discharge Plan and Services           Expected Discharge Date: 09/03/19                                     Social Determinants of Health (SDOH) Interventions    Readmission Risk Interventions No flowsheet data found.

## 2019-09-08 DIAGNOSIS — I639 Cerebral infarction, unspecified: Secondary | ICD-10-CM | POA: Diagnosis not present

## 2019-09-08 DIAGNOSIS — L98499 Non-pressure chronic ulcer of skin of other sites with unspecified severity: Secondary | ICD-10-CM | POA: Diagnosis not present

## 2019-09-08 DIAGNOSIS — R627 Adult failure to thrive: Secondary | ICD-10-CM | POA: Diagnosis not present

## 2019-09-08 DIAGNOSIS — I5032 Chronic diastolic (congestive) heart failure: Secondary | ICD-10-CM | POA: Diagnosis not present

## 2019-09-08 DIAGNOSIS — R4182 Altered mental status, unspecified: Secondary | ICD-10-CM | POA: Diagnosis not present

## 2019-09-12 DIAGNOSIS — D649 Anemia, unspecified: Secondary | ICD-10-CM | POA: Diagnosis not present

## 2019-09-12 DIAGNOSIS — N189 Chronic kidney disease, unspecified: Secondary | ICD-10-CM | POA: Diagnosis not present

## 2019-09-20 DIAGNOSIS — F5101 Primary insomnia: Secondary | ICD-10-CM | POA: Diagnosis not present

## 2019-09-20 DIAGNOSIS — F200 Paranoid schizophrenia: Secondary | ICD-10-CM | POA: Diagnosis not present

## 2019-09-20 DIAGNOSIS — F062 Psychotic disorder with delusions due to known physiological condition: Secondary | ICD-10-CM | POA: Diagnosis not present

## 2019-09-20 DIAGNOSIS — F0151 Vascular dementia with behavioral disturbance: Secondary | ICD-10-CM | POA: Diagnosis not present

## 2019-09-20 DIAGNOSIS — F331 Major depressive disorder, recurrent, moderate: Secondary | ICD-10-CM | POA: Diagnosis not present

## 2019-09-21 DIAGNOSIS — Z79899 Other long term (current) drug therapy: Secondary | ICD-10-CM | POA: Diagnosis not present

## 2019-09-25 DIAGNOSIS — D649 Anemia, unspecified: Secondary | ICD-10-CM | POA: Diagnosis not present

## 2019-09-29 DIAGNOSIS — N184 Chronic kidney disease, stage 4 (severe): Secondary | ICD-10-CM | POA: Diagnosis not present

## 2019-09-29 DIAGNOSIS — F0151 Vascular dementia with behavioral disturbance: Secondary | ICD-10-CM | POA: Diagnosis not present

## 2019-09-29 DIAGNOSIS — Z515 Encounter for palliative care: Secondary | ICD-10-CM | POA: Diagnosis not present

## 2019-10-12 DIAGNOSIS — M79673 Pain in unspecified foot: Secondary | ICD-10-CM | POA: Diagnosis not present

## 2019-10-12 DIAGNOSIS — M25559 Pain in unspecified hip: Secondary | ICD-10-CM | POA: Diagnosis not present

## 2019-10-12 DIAGNOSIS — I5032 Chronic diastolic (congestive) heart failure: Secondary | ICD-10-CM | POA: Diagnosis not present

## 2019-10-12 DIAGNOSIS — F0151 Vascular dementia with behavioral disturbance: Secondary | ICD-10-CM | POA: Diagnosis not present

## 2019-10-12 DIAGNOSIS — R627 Adult failure to thrive: Secondary | ICD-10-CM | POA: Diagnosis not present

## 2019-10-12 DIAGNOSIS — I69391 Dysphagia following cerebral infarction: Secondary | ICD-10-CM | POA: Diagnosis not present

## 2019-10-12 DIAGNOSIS — I10 Essential (primary) hypertension: Secondary | ICD-10-CM | POA: Diagnosis not present

## 2019-10-12 DIAGNOSIS — D638 Anemia in other chronic diseases classified elsewhere: Secondary | ICD-10-CM | POA: Diagnosis not present

## 2019-10-12 DIAGNOSIS — F200 Paranoid schizophrenia: Secondary | ICD-10-CM | POA: Diagnosis not present

## 2019-10-12 DIAGNOSIS — I6529 Occlusion and stenosis of unspecified carotid artery: Secondary | ICD-10-CM | POA: Diagnosis not present

## 2019-10-12 DIAGNOSIS — F0281 Dementia in other diseases classified elsewhere with behavioral disturbance: Secondary | ICD-10-CM | POA: Diagnosis not present

## 2019-10-12 DIAGNOSIS — F39 Unspecified mood [affective] disorder: Secondary | ICD-10-CM | POA: Diagnosis not present

## 2019-10-12 DIAGNOSIS — M25561 Pain in right knee: Secondary | ICD-10-CM | POA: Diagnosis not present

## 2019-10-12 DIAGNOSIS — N184 Chronic kidney disease, stage 4 (severe): Secondary | ICD-10-CM | POA: Diagnosis not present

## 2019-10-12 DIAGNOSIS — F331 Major depressive disorder, recurrent, moderate: Secondary | ICD-10-CM | POA: Diagnosis not present

## 2019-10-12 DIAGNOSIS — I739 Peripheral vascular disease, unspecified: Secondary | ICD-10-CM | POA: Diagnosis not present

## 2019-10-12 DIAGNOSIS — I69351 Hemiplegia and hemiparesis following cerebral infarction affecting right dominant side: Secondary | ICD-10-CM | POA: Diagnosis not present

## 2019-10-12 DIAGNOSIS — E46 Unspecified protein-calorie malnutrition: Secondary | ICD-10-CM | POA: Diagnosis not present

## 2019-10-12 DIAGNOSIS — N189 Chronic kidney disease, unspecified: Secondary | ICD-10-CM | POA: Diagnosis not present

## 2019-10-12 DIAGNOSIS — D649 Anemia, unspecified: Secondary | ICD-10-CM | POA: Diagnosis not present

## 2019-10-12 DIAGNOSIS — I6601 Occlusion and stenosis of right middle cerebral artery: Secondary | ICD-10-CM | POA: Diagnosis not present

## 2019-10-12 DIAGNOSIS — I639 Cerebral infarction, unspecified: Secondary | ICD-10-CM | POA: Diagnosis not present

## 2019-10-12 DIAGNOSIS — L89890 Pressure ulcer of other site, unstageable: Secondary | ICD-10-CM | POA: Diagnosis not present

## 2019-10-12 DIAGNOSIS — F5101 Primary insomnia: Secondary | ICD-10-CM | POA: Diagnosis not present

## 2019-10-12 DIAGNOSIS — E785 Hyperlipidemia, unspecified: Secondary | ICD-10-CM | POA: Diagnosis not present

## 2019-10-12 DIAGNOSIS — F062 Psychotic disorder with delusions due to known physiological condition: Secondary | ICD-10-CM | POA: Diagnosis not present

## 2019-10-12 DIAGNOSIS — D631 Anemia in chronic kidney disease: Secondary | ICD-10-CM | POA: Diagnosis not present

## 2019-10-12 DIAGNOSIS — L89893 Pressure ulcer of other site, stage 3: Secondary | ICD-10-CM | POA: Diagnosis not present

## 2019-10-23 DIAGNOSIS — R627 Adult failure to thrive: Secondary | ICD-10-CM | POA: Diagnosis not present

## 2019-10-23 DIAGNOSIS — F0151 Vascular dementia with behavioral disturbance: Secondary | ICD-10-CM | POA: Diagnosis not present

## 2019-10-23 DIAGNOSIS — I5032 Chronic diastolic (congestive) heart failure: Secondary | ICD-10-CM | POA: Diagnosis not present

## 2019-10-23 DIAGNOSIS — I639 Cerebral infarction, unspecified: Secondary | ICD-10-CM | POA: Diagnosis not present

## 2019-10-23 DIAGNOSIS — I739 Peripheral vascular disease, unspecified: Secondary | ICD-10-CM | POA: Diagnosis not present

## 2019-10-23 DIAGNOSIS — I10 Essential (primary) hypertension: Secondary | ICD-10-CM | POA: Diagnosis not present

## 2019-11-08 DIAGNOSIS — E46 Unspecified protein-calorie malnutrition: Secondary | ICD-10-CM | POA: Diagnosis not present

## 2019-11-08 DIAGNOSIS — D638 Anemia in other chronic diseases classified elsewhere: Secondary | ICD-10-CM | POA: Diagnosis not present

## 2019-11-08 DIAGNOSIS — L89893 Pressure ulcer of other site, stage 3: Secondary | ICD-10-CM | POA: Diagnosis not present

## 2019-11-08 DIAGNOSIS — I10 Essential (primary) hypertension: Secondary | ICD-10-CM | POA: Diagnosis not present

## 2019-11-22 DIAGNOSIS — F062 Psychotic disorder with delusions due to known physiological condition: Secondary | ICD-10-CM | POA: Diagnosis not present

## 2019-11-22 DIAGNOSIS — F5101 Primary insomnia: Secondary | ICD-10-CM | POA: Diagnosis not present

## 2019-11-22 DIAGNOSIS — F200 Paranoid schizophrenia: Secondary | ICD-10-CM | POA: Diagnosis not present

## 2019-11-22 DIAGNOSIS — F0151 Vascular dementia with behavioral disturbance: Secondary | ICD-10-CM | POA: Diagnosis not present

## 2019-11-22 DIAGNOSIS — F331 Major depressive disorder, recurrent, moderate: Secondary | ICD-10-CM | POA: Diagnosis not present

## 2019-12-04 DIAGNOSIS — R627 Adult failure to thrive: Secondary | ICD-10-CM | POA: Diagnosis not present

## 2019-12-04 DIAGNOSIS — I739 Peripheral vascular disease, unspecified: Secondary | ICD-10-CM | POA: Diagnosis not present

## 2019-12-04 DIAGNOSIS — I5032 Chronic diastolic (congestive) heart failure: Secondary | ICD-10-CM | POA: Diagnosis not present

## 2019-12-04 DIAGNOSIS — D638 Anemia in other chronic diseases classified elsewhere: Secondary | ICD-10-CM | POA: Diagnosis not present

## 2019-12-19 DIAGNOSIS — R52 Pain, unspecified: Secondary | ICD-10-CM | POA: Diagnosis not present

## 2019-12-19 DIAGNOSIS — Z79899 Other long term (current) drug therapy: Secondary | ICD-10-CM | POA: Diagnosis not present

## 2019-12-19 DIAGNOSIS — G8929 Other chronic pain: Secondary | ICD-10-CM | POA: Diagnosis not present

## 2019-12-22 DIAGNOSIS — Z515 Encounter for palliative care: Secondary | ICD-10-CM | POA: Diagnosis not present

## 2019-12-22 DIAGNOSIS — F0151 Vascular dementia with behavioral disturbance: Secondary | ICD-10-CM | POA: Diagnosis not present

## 2019-12-22 DIAGNOSIS — N184 Chronic kidney disease, stage 4 (severe): Secondary | ICD-10-CM | POA: Diagnosis not present

## 2019-12-27 DIAGNOSIS — D649 Anemia, unspecified: Secondary | ICD-10-CM | POA: Diagnosis not present

## 2019-12-27 DIAGNOSIS — Z79899 Other long term (current) drug therapy: Secondary | ICD-10-CM | POA: Diagnosis not present

## 2020-01-03 DIAGNOSIS — F062 Psychotic disorder with delusions due to known physiological condition: Secondary | ICD-10-CM | POA: Diagnosis not present

## 2020-01-03 DIAGNOSIS — F200 Paranoid schizophrenia: Secondary | ICD-10-CM | POA: Diagnosis not present

## 2020-01-03 DIAGNOSIS — F0151 Vascular dementia with behavioral disturbance: Secondary | ICD-10-CM | POA: Diagnosis not present

## 2020-01-03 DIAGNOSIS — F331 Major depressive disorder, recurrent, moderate: Secondary | ICD-10-CM | POA: Diagnosis not present

## 2020-01-03 DIAGNOSIS — F5101 Primary insomnia: Secondary | ICD-10-CM | POA: Diagnosis not present

## 2020-01-26 DIAGNOSIS — D649 Anemia, unspecified: Secondary | ICD-10-CM | POA: Diagnosis not present

## 2020-01-30 DIAGNOSIS — G8929 Other chronic pain: Secondary | ICD-10-CM | POA: Diagnosis not present

## 2020-01-30 DIAGNOSIS — Z79899 Other long term (current) drug therapy: Secondary | ICD-10-CM | POA: Diagnosis not present

## 2020-01-30 DIAGNOSIS — N184 Chronic kidney disease, stage 4 (severe): Secondary | ICD-10-CM | POA: Diagnosis not present

## 2020-01-30 DIAGNOSIS — R52 Pain, unspecified: Secondary | ICD-10-CM | POA: Diagnosis not present

## 2020-01-31 DIAGNOSIS — D649 Anemia, unspecified: Secondary | ICD-10-CM | POA: Diagnosis not present

## 2020-02-07 DIAGNOSIS — F5101 Primary insomnia: Secondary | ICD-10-CM | POA: Diagnosis not present

## 2020-02-07 DIAGNOSIS — F062 Psychotic disorder with delusions due to known physiological condition: Secondary | ICD-10-CM | POA: Diagnosis not present

## 2020-02-07 DIAGNOSIS — F0151 Vascular dementia with behavioral disturbance: Secondary | ICD-10-CM | POA: Diagnosis not present

## 2020-02-07 DIAGNOSIS — F331 Major depressive disorder, recurrent, moderate: Secondary | ICD-10-CM | POA: Diagnosis not present

## 2020-02-15 DIAGNOSIS — I69351 Hemiplegia and hemiparesis following cerebral infarction affecting right dominant side: Secondary | ICD-10-CM | POA: Diagnosis not present

## 2020-02-19 DIAGNOSIS — I69351 Hemiplegia and hemiparesis following cerebral infarction affecting right dominant side: Secondary | ICD-10-CM | POA: Diagnosis not present

## 2020-02-20 DIAGNOSIS — D649 Anemia, unspecified: Secondary | ICD-10-CM | POA: Diagnosis not present

## 2020-02-26 DIAGNOSIS — N184 Chronic kidney disease, stage 4 (severe): Secondary | ICD-10-CM | POA: Diagnosis not present

## 2020-02-26 DIAGNOSIS — F0151 Vascular dementia with behavioral disturbance: Secondary | ICD-10-CM | POA: Diagnosis not present

## 2020-02-26 DIAGNOSIS — Z515 Encounter for palliative care: Secondary | ICD-10-CM | POA: Diagnosis not present

## 2020-03-13 DIAGNOSIS — F0151 Vascular dementia with behavioral disturbance: Secondary | ICD-10-CM | POA: Diagnosis not present

## 2020-03-13 DIAGNOSIS — F209 Schizophrenia, unspecified: Secondary | ICD-10-CM | POA: Diagnosis not present

## 2020-03-13 DIAGNOSIS — I10 Essential (primary) hypertension: Secondary | ICD-10-CM | POA: Diagnosis not present

## 2020-03-13 DIAGNOSIS — I5030 Unspecified diastolic (congestive) heart failure: Secondary | ICD-10-CM | POA: Diagnosis not present

## 2020-03-13 DIAGNOSIS — I69359 Hemiplegia and hemiparesis following cerebral infarction affecting unspecified side: Secondary | ICD-10-CM | POA: Diagnosis not present

## 2020-03-13 DIAGNOSIS — D649 Anemia, unspecified: Secondary | ICD-10-CM | POA: Diagnosis not present

## 2020-03-13 DIAGNOSIS — F331 Major depressive disorder, recurrent, moderate: Secondary | ICD-10-CM | POA: Diagnosis not present

## 2020-03-26 DIAGNOSIS — G8929 Other chronic pain: Secondary | ICD-10-CM | POA: Diagnosis not present

## 2020-03-26 DIAGNOSIS — Z79899 Other long term (current) drug therapy: Secondary | ICD-10-CM | POA: Diagnosis not present

## 2020-03-26 DIAGNOSIS — R52 Pain, unspecified: Secondary | ICD-10-CM | POA: Diagnosis not present

## 2020-08-21 ENCOUNTER — Other Ambulatory Visit (HOSPITAL_COMMUNITY): Payer: Self-pay | Admitting: Nephrology

## 2020-08-21 ENCOUNTER — Other Ambulatory Visit: Payer: Self-pay | Admitting: Nephrology

## 2020-08-21 DIAGNOSIS — E875 Hyperkalemia: Secondary | ICD-10-CM

## 2020-08-21 DIAGNOSIS — I5032 Chronic diastolic (congestive) heart failure: Secondary | ICD-10-CM

## 2020-08-21 DIAGNOSIS — Z79899 Other long term (current) drug therapy: Secondary | ICD-10-CM

## 2020-08-21 DIAGNOSIS — D638 Anemia in other chronic diseases classified elsewhere: Secondary | ICD-10-CM

## 2020-08-21 DIAGNOSIS — E872 Acidosis, unspecified: Secondary | ICD-10-CM

## 2020-08-21 DIAGNOSIS — N184 Chronic kidney disease, stage 4 (severe): Secondary | ICD-10-CM

## 2020-08-21 DIAGNOSIS — I129 Hypertensive chronic kidney disease with stage 1 through stage 4 chronic kidney disease, or unspecified chronic kidney disease: Secondary | ICD-10-CM

## 2020-09-02 ENCOUNTER — Other Ambulatory Visit: Payer: Self-pay

## 2020-09-02 ENCOUNTER — Ambulatory Visit (HOSPITAL_COMMUNITY)
Admission: RE | Admit: 2020-09-02 | Discharge: 2020-09-02 | Disposition: A | Discharge status: Home / Self Care | Payer: Medicare Other | Source: Ambulatory Visit | Attending: Nephrology | Admitting: Nephrology

## 2020-09-02 DIAGNOSIS — I129 Hypertensive chronic kidney disease with stage 1 through stage 4 chronic kidney disease, or unspecified chronic kidney disease: Secondary | ICD-10-CM | POA: Diagnosis present

## 2020-09-02 DIAGNOSIS — I5032 Chronic diastolic (congestive) heart failure: Secondary | ICD-10-CM | POA: Insufficient documentation

## 2020-09-02 DIAGNOSIS — N184 Chronic kidney disease, stage 4 (severe): Secondary | ICD-10-CM | POA: Diagnosis present

## 2020-09-02 DIAGNOSIS — D638 Anemia in other chronic diseases classified elsewhere: Secondary | ICD-10-CM | POA: Insufficient documentation

## 2020-09-02 DIAGNOSIS — Z79899 Other long term (current) drug therapy: Secondary | ICD-10-CM | POA: Insufficient documentation

## 2020-09-02 DIAGNOSIS — E875 Hyperkalemia: Secondary | ICD-10-CM | POA: Diagnosis present

## 2020-09-02 DIAGNOSIS — E872 Acidosis, unspecified: Secondary | ICD-10-CM

## 2020-12-11 ENCOUNTER — Encounter: Payer: Self-pay | Admitting: Vascular Surgery

## 2020-12-11 ENCOUNTER — Ambulatory Visit (INDEPENDENT_AMBULATORY_CARE_PROVIDER_SITE_OTHER): Payer: Medicare Other | Admitting: Vascular Surgery

## 2020-12-11 ENCOUNTER — Other Ambulatory Visit (HOSPITAL_COMMUNITY): Payer: Self-pay | Admitting: Vascular Surgery

## 2020-12-11 ENCOUNTER — Ambulatory Visit (INDEPENDENT_AMBULATORY_CARE_PROVIDER_SITE_OTHER): Payer: Medicare Other

## 2020-12-11 ENCOUNTER — Other Ambulatory Visit: Payer: Self-pay

## 2020-12-11 VITALS — BP 165/60 | HR 48 | Temp 97.0°F

## 2020-12-11 DIAGNOSIS — N19 Unspecified kidney failure: Secondary | ICD-10-CM | POA: Diagnosis not present

## 2020-12-11 DIAGNOSIS — N184 Chronic kidney disease, stage 4 (severe): Secondary | ICD-10-CM | POA: Diagnosis not present

## 2020-12-11 NOTE — Progress Notes (Signed)
Vascular and Vein Specialist of [place]  Patient name: Lucas [name] MRN: [id number] DOB: Aug 21, 1950 Sex: male  REASON FOR CONSULT: Discuss access for hemodialysis  HPI: Lucas [name] is a 70 y.o. male, who is here today for discussion of access for hemodialysis.  He has had progressive renal insufficiency and is now stage IV.  Most recent creatinine is 3.75.  He lives at [facility] facility.  He is chronically disabled.  Has a history of alcohol and polysubstance abuse.  Also history of schizoaffective disorder.  He reports that he is ambidextrous.  He does not have a pacemaker.  He is not on anticoagulation.  Past Medical History:  Diagnosis Date   Alcohol dependence ([place])    Altered mental status    Anorexia    Carotid artery stenosis    Cataract    CHF (congestive heart failure) (HCC)    Chronic diastolic heart failure (HCC)    Chronic kidney disease, stage IV (severe) (HCC)    Cognitive deficits    CVA (cerebral infarction) 11/2010   CT 8/12 : Sm ischemic infarct posterior limb of L internal capsule. Carotids nl. ECHO nl with EF 55-60%. R sided weakness.    Dementia ([place])    Dementia ([place])    Dysphagia    Failure to thrive in adult    GI hemorrhage    Hyperlipemia    Hyperlipidemia 01/06/2011   Diagnosed during acute CVA hospitalization. On statin.   Hypertension 11/2010   Diagnosed during acute CVA hospitalization   Metabolic encephalopathy    Occlusion of anterior cerebral artery    Peripheral vascular disease (HCC)    Polysubstance abuse ([place]) 01/06/2011   cocaine, tobacco, THC   Psychotic disorder ([place])    PVD (peripheral vascular disease) ([place])    Right hemiplegia (HCC)    Schizoaffective disorder ([place])    Stroke ([place])     Family History  Problem Relation Age of Onset   Hypertension Mother    Hypertension Father     SOCIAL HISTORY: Social History   Socioeconomic History   Marital status: Single    Spouse  name: Not on file   Number of children: Not on file   Years of education: Not on file   Highest education level: Not on file  Occupational History   Not on file  Tobacco Use   Smoking status: Some Days    Packs/day: 0.50    Years: 40.00    Pack years: 20.00    Types: Cigarettes    Start date: 01/02/1965   Smokeless tobacco: Never   Tobacco comments:    2-3 per week  Vaping Use   Vaping Use: Never used  Substance and Sexual Activity   Alcohol use: Yes    Alcohol/week: 2.0 standard drinks    Types: 2 Standard drinks or equivalent per week    Comment: beer   Drug use: Not Currently    Types: Cocaine   Sexual activity: Never  Other Topics Concern   Not on file  Social History Narrative   Not on file   Social Determinants of Health   Financial Resource Strain: Not on file  Food Insecurity: Not on file  Transportation Needs: Not on file  Physical Activity: Not on file  Stress: Not on file  Social Connections: Not on file  Intimate Partner Violence: Not on file    Allergies  Allergen Reactions   Pork-Derived Products Other (See Comments)    Patient does  not eat pork products.    Current Outpatient Medications  Medication Sig Dispense Refill   acetaminophen (TYLENOL) 325 MG tablet Take 650 mg by mouth every 4 (four) hours as needed.     AMINO ACIDS-PROTEIN HYDROLYS PO Take 30 mLs by mouth daily.     amLODipine (NORVASC) 10 MG tablet Take 1 tablet (10 mg total) by mouth daily. (Patient taking differently: Take 5 mg by mouth daily.) 90 tablet 3   atorvastatin (LIPITOR) 40 MG tablet Take 1 tablet (40 mg total) by mouth daily at 6 PM.     CALMOSEPTINE 0.44-20.6 % OINT Apply 1 application topically daily.      carvedilol (COREG) 6.25 MG tablet Take 1 tablet (6.25 mg total) by mouth 2 (two) times daily with a meal.     cholestyramine light (PREVALITE) 4 g packet Take 1 packet by mouth daily.     cloNIDine (CATAPRES - DOSED IN MG/24 HR) 0.2 mg/24hr patch Place 0.2 mg onto the  skin once a week.      cloNIDine (CATAPRES) 0.2 MG tablet Take 1 tablet (0.2 mg total) by mouth 2 (two) times daily. (Patient taking differently: Take 0.1 mg by mouth every 6 (six) hours as needed (blood pressure).)     clopidogrel (PLAVIX) 75 MG tablet Take 1 tablet (75 mg total) by mouth daily.     divalproex (DEPAKOTE SPRINKLE) 125 MG capsule Take 500 mg by mouth daily.      divalproex (DEPAKOTE) 250 MG DR tablet Take 250 mg by mouth daily.     hydrALAZINE (APRESOLINE) 100 MG tablet Take 100 mg by mouth 3 (three) times daily.     mirtazapine (REMERON) 7.5 MG tablet Take 7.5 mg by mouth at bedtime.     Multiple Vitamins-Minerals (MULTIPLE VIT/MINERALS/NO IRON) TABS Take 1 tablet by mouth daily.     NORCO 5-325 MG tablet Take 0.5 tablets by mouth 3 (three) times daily. 8 tablet 0   omeprazole (PRILOSEC) 40 MG capsule Take 40 mg by mouth 2 (two) times daily.     Skin Protectants, Misc. (MINERIN CREME) CREA Apply 1 application topically in the morning and at bedtime.      No current facility-administered medications for this visit.    REVIEW OF SYSTEMS:  '[X]'$  denotes positive finding, '[ ]'$  denotes negative finding Cardiac  Comments:  Chest pain or chest pressure:    Shortness of breath upon exertion:    Short of breath when lying flat:    Irregular heart rhythm:        Vascular    Pain in calf, thigh, or hip brought on by ambulation:    Pain in feet at night that wakes you up from your sleep:     Blood clot in your veins:    Leg swelling:         Pulmonary    Oxygen at home:    Productive cough:     Wheezing:         Neurologic    Sudden weakness in arms or legs:     Sudden numbness in arms or legs:     Sudden onset of difficulty speaking or slurred speech:    Temporary loss of vision in one eye:     Problems with dizziness:         Gastrointestinal    Blood in stool:     Vomited blood:         Genitourinary    Burning when urinating:  Blood in urine:         Psychiatric    Major depression:         Hematologic    Bleeding problems:    Problems with blood clotting too easily:        Skin    Rashes or ulcers:        Constitutional    Fever or chills:      PHYSICAL EXAM: Vitals:   12/11/20 1104  BP: (!) 165/60  Pulse: (!) 48  Temp: (!) 97 F (36.1 C)  TempSrc: Temporal  SpO2: 100%    GENERAL: The patient is a well-nourished male, in no acute distress. The vital signs are documented above. CARDIOVASCULAR: 2+ brachial and 2+ radial pulses bilaterally.  Very small surface veins bilaterally PULMONARY: There is good air exchange  MUSCULOSKELETAL: There are no major deformities or cyanosis. NEUROLOGIC: No focal weakness or paresthesias are detected. SKIN: There are no ulcers or rashes noted. PSYCHIATRIC: The patient has a normal affect.  DATA:  Duplex imaging today reveals extremely small cephalic and basilic veins bilaterally  MEDICAL ISSUES: Patient has very poor understanding of his disease process.  I did discuss the eventual need for hemodialysis.  He does not appear to be a fistula candidate.  He would require a left arm AV Gore-Tex graft as his initial dialysis access.  I spoke with Dr. Theador Hawthorne by telephone regarding this.  Since he is not a fistula candidate, we will defer graft placement until he is more eminently approaching need for dialysis access.   Rosetta Posner, MD FACS [facility] [phone] Pager [phone]  Note: Portions of this report may have been transcribed using voice recognition software.  Every effort has been made to ensure accuracy; however, inadvertent computerized transcription errors may still be present.

## 2021-02-07 ENCOUNTER — Other Ambulatory Visit: Payer: Self-pay

## 2021-02-07 ENCOUNTER — Encounter (HOSPITAL_COMMUNITY): Payer: Self-pay | Admitting: Emergency Medicine

## 2021-02-07 ENCOUNTER — Observation Stay (HOSPITAL_COMMUNITY)
Admission: EM | Admit: 2021-02-07 | Discharge: 2021-02-08 | Disposition: A | Discharge status: Other Acute Inpatient Hospital | Payer: Medicare Other | Attending: Family Medicine | Admitting: Family Medicine

## 2021-02-07 DIAGNOSIS — E785 Hyperlipidemia, unspecified: Secondary | ICD-10-CM | POA: Diagnosis present

## 2021-02-07 DIAGNOSIS — Z20822 Contact with and (suspected) exposure to covid-19: Secondary | ICD-10-CM | POA: Insufficient documentation

## 2021-02-07 DIAGNOSIS — Z79899 Other long term (current) drug therapy: Secondary | ICD-10-CM | POA: Diagnosis not present

## 2021-02-07 DIAGNOSIS — I130 Hypertensive heart and chronic kidney disease with heart failure and stage 1 through stage 4 chronic kidney disease, or unspecified chronic kidney disease: Secondary | ICD-10-CM | POA: Diagnosis not present

## 2021-02-07 DIAGNOSIS — I639 Cerebral infarction, unspecified: Secondary | ICD-10-CM | POA: Diagnosis present

## 2021-02-07 DIAGNOSIS — N170 Acute kidney failure with tubular necrosis: Secondary | ICD-10-CM | POA: Diagnosis not present

## 2021-02-07 DIAGNOSIS — F17210 Nicotine dependence, cigarettes, uncomplicated: Secondary | ICD-10-CM | POA: Diagnosis not present

## 2021-02-07 DIAGNOSIS — N179 Acute kidney failure, unspecified: Secondary | ICD-10-CM | POA: Diagnosis not present

## 2021-02-07 DIAGNOSIS — E875 Hyperkalemia: Principal | ICD-10-CM | POA: Diagnosis present

## 2021-02-07 DIAGNOSIS — D631 Anemia in chronic kidney disease: Secondary | ICD-10-CM | POA: Insufficient documentation

## 2021-02-07 DIAGNOSIS — I5032 Chronic diastolic (congestive) heart failure: Secondary | ICD-10-CM | POA: Diagnosis not present

## 2021-02-07 DIAGNOSIS — I129 Hypertensive chronic kidney disease with stage 1 through stage 4 chronic kidney disease, or unspecified chronic kidney disease: Secondary | ICD-10-CM | POA: Insufficient documentation

## 2021-02-07 DIAGNOSIS — E871 Hypo-osmolality and hyponatremia: Secondary | ICD-10-CM | POA: Diagnosis not present

## 2021-02-07 DIAGNOSIS — F0390 Unspecified dementia without behavioral disturbance: Secondary | ICD-10-CM | POA: Insufficient documentation

## 2021-02-07 DIAGNOSIS — N184 Chronic kidney disease, stage 4 (severe): Secondary | ICD-10-CM | POA: Diagnosis not present

## 2021-02-07 DIAGNOSIS — Z23 Encounter for immunization: Secondary | ICD-10-CM | POA: Insufficient documentation

## 2021-02-07 DIAGNOSIS — I10 Essential (primary) hypertension: Secondary | ICD-10-CM

## 2021-02-07 LAB — COMPREHENSIVE METABOLIC PANEL
ALT: 8 U/L (ref 0–44)
AST: 13 U/L — ABNORMAL LOW (ref 15–41)
Albumin: 3.7 g/dL (ref 3.5–5.0)
Alkaline Phosphatase: 50 U/L (ref 38–126)
Anion gap: 10 (ref 5–15)
BUN: 58 mg/dL — ABNORMAL HIGH (ref 8–23)
CO2: 21 mmol/L — ABNORMAL LOW (ref 22–32)
Calcium: 7.9 mg/dL — ABNORMAL LOW (ref 8.9–10.3)
Chloride: 110 mmol/L (ref 98–111)
Creatinine, Ser: 4.38 mg/dL — ABNORMAL HIGH (ref 0.61–1.24)
GFR, Estimated: 14 mL/min — ABNORMAL LOW (ref >60)
Glucose, Bld: 97 mg/dL (ref 70–99)
Potassium: 5.5 mmol/L — ABNORMAL HIGH (ref 3.5–5.1)
Sodium: 141 mmol/L (ref 135–145)
Total Bilirubin: 0 mg/dL — ABNORMAL LOW (ref 0.3–1.2)
Total Protein: 7.1 g/dL (ref 6.5–8.1)

## 2021-02-07 LAB — BLOOD GAS, VENOUS
Acid-base deficit: 1.5 mmol/L (ref 0.0–2.0)
Bicarbonate: 22.6 mmol/L (ref 20.0–28.0)
FIO2: 21
O2 Saturation: 76.9 %
Patient temperature: 36.4
pCO2, Ven: 42.5 mmHg — ABNORMAL LOW (ref 44.0–60.0)
pH, Ven: 7.355 (ref 7.250–7.430)
pO2, Ven: 45.6 mmHg — ABNORMAL HIGH (ref 32.0–45.0)

## 2021-02-07 LAB — CBC
HCT: 28 % — ABNORMAL LOW (ref 39.0–52.0)
Hemoglobin: 8.4 g/dL — ABNORMAL LOW (ref 13.0–17.0)
MCH: 27.5 pg (ref 26.0–34.0)
MCHC: 30 g/dL (ref 30.0–36.0)
MCV: 91.8 fL (ref 80.0–100.0)
Platelets: 153 10*3/uL (ref 150–400)
RBC: 3.05 MIL/uL — ABNORMAL LOW (ref 4.22–5.81)
RDW: 13.7 % (ref 11.5–15.5)
WBC: 5.7 10*3/uL (ref 4.0–10.5)
nRBC: 0 % (ref 0.0–0.2)

## 2021-02-07 LAB — BASIC METABOLIC PANEL
Anion gap: 9 (ref 5–15)
BUN: 57 mg/dL — ABNORMAL HIGH (ref 8–23)
CO2: 26 mmol/L (ref 22–32)
Calcium: 8.9 mg/dL (ref 8.9–10.3)
Chloride: 109 mmol/L (ref 98–111)
Creatinine, Ser: 4.4 mg/dL — ABNORMAL HIGH (ref 0.61–1.24)
GFR, Estimated: 14 mL/min — ABNORMAL LOW (ref >60)
Glucose, Bld: 70 mg/dL (ref 70–99)
Potassium: 4.7 mmol/L (ref 3.5–5.1)
Sodium: 144 mmol/L (ref 135–145)

## 2021-02-07 LAB — FERRITIN: Ferritin: 616 ng/mL — ABNORMAL HIGH (ref 24–336)

## 2021-02-07 LAB — HEPATITIS B SURFACE ANTIBODY,QUALITATIVE: Hep B S Ab: NONREACTIVE

## 2021-02-07 LAB — HEPATITIS B SURFACE ANTIGEN: Hepatitis B Surface Ag: NONREACTIVE

## 2021-02-07 LAB — IRON AND TIBC
Iron: 48 ug/dL (ref 45–182)
Saturation Ratios: 24 % (ref 17.9–39.5)
TIBC: 200 ug/dL — ABNORMAL LOW (ref 250–450)
UIBC: 152 ug/dL

## 2021-02-07 LAB — RESP PANEL BY RT-PCR (FLU A&B, COVID) ARPGX2
Influenza A by PCR: NEGATIVE
Influenza B by PCR: NEGATIVE
SARS Coronavirus 2 by RT PCR: NEGATIVE

## 2021-02-07 LAB — MAGNESIUM: Magnesium: 2.9 mg/dL — ABNORMAL HIGH (ref 1.7–2.4)

## 2021-02-07 LAB — PHOSPHORUS: Phosphorus: 3.5 mg/dL (ref 2.5–4.6)

## 2021-02-07 MED ORDER — CALCIUM GLUCONATE-NACL 1-0.675 GM/50ML-% IV SOLN
1.0000 g | Freq: Once | INTRAVENOUS | Status: AC
  Administered 2021-02-07: 1000 mg via INTRAVENOUS
  Filled 2021-02-07: qty 50

## 2021-02-07 MED ORDER — SODIUM POLYSTYRENE SULFONATE 15 GM/60ML PO SUSP
15.0000 g | Freq: Once | ORAL | Status: AC
  Administered 2021-02-07: 15 g via ORAL
  Filled 2021-02-07: qty 60

## 2021-02-07 MED ORDER — INSULIN ASPART 100 UNIT/ML IV SOLN
5.0000 [IU] | Freq: Once | INTRAVENOUS | Status: AC
Start: 2021-02-07 — End: 2021-02-07
  Administered 2021-02-07: 5 [IU] via INTRAVENOUS

## 2021-02-07 MED ORDER — PANTOPRAZOLE SODIUM 40 MG PO TBEC
40.0000 mg | DELAYED_RELEASE_TABLET | Freq: Every day | ORAL | Status: DC
  Administered 2021-02-07 – 2021-02-08 (×2): 40 mg via ORAL
  Filled 2021-02-07 (×2): qty 1

## 2021-02-07 MED ORDER — SODIUM BICARBONATE 650 MG PO TABS
650.0000 mg | ORAL_TABLET | Freq: Three times a day (TID) | ORAL | Status: DC
  Administered 2021-02-07 – 2021-02-08 (×4): 650 mg via ORAL
  Filled 2021-02-07 (×5): qty 1

## 2021-02-07 MED ORDER — ATORVASTATIN CALCIUM 40 MG PO TABS
40.0000 mg | ORAL_TABLET | Freq: Every day | ORAL | Status: DC
  Administered 2021-02-08: 40 mg via ORAL
  Filled 2021-02-07: qty 1

## 2021-02-07 MED ORDER — SODIUM CHLORIDE 0.9 % IV SOLN: INTRAVENOUS | Status: DC

## 2021-02-07 MED ORDER — DEXTROSE 50 % IV SOLN
1.0000 | Freq: Once | INTRAVENOUS | Status: AC
  Administered 2021-02-07: 50 mL via INTRAVENOUS
  Filled 2021-02-07: qty 50

## 2021-02-07 MED ORDER — CLOPIDOGREL BISULFATE 75 MG PO TABS
75.0000 mg | ORAL_TABLET | Freq: Every day | ORAL | Status: DC
  Administered 2021-02-08: 75 mg via ORAL
  Filled 2021-02-07: qty 1

## 2021-02-07 MED ORDER — HEPARIN SODIUM (PORCINE) 5000 UNIT/ML IJ SOLN
5000.0000 [IU] | Freq: Three times a day (TID) | INTRAMUSCULAR | Status: DC
Start: 2021-02-08 — End: 2021-02-09
  Filled 2021-02-07: qty 1

## 2021-02-07 MED ORDER — SODIUM BICARBONATE 8.4 % IV SOLN
50.0000 meq | Freq: Once | INTRAVENOUS | Status: AC
  Administered 2021-02-07: 50 meq via INTRAVENOUS

## 2021-02-07 MED ORDER — HYDRALAZINE HCL 20 MG/ML IJ SOLN
10.0000 mg | INTRAMUSCULAR | Status: DC | PRN
  Administered 2021-02-08 (×2): 10 mg via INTRAVENOUS
  Filled 2021-02-07 (×2): qty 1

## 2021-02-07 NOTE — Progress Notes (Addendum)
Discussed case with Drs. Prosperi and Elgergawy.  Pt was sent from his MD's office to start dialysis, however after reviewing his labs over the past week:   01/24/21:  Hgb 9.2, Cr 4.9, K 5.9 01/31/21:  Cr 5.2, K 6.5, Co2 18 and it was on these labs that he was sent to [facility] ED to initiate dialysis, however his labs today are improved with K 5.5, and Cr 4.38 and BUN 58.    Per discussion with Dr. Carlus Pavlov, he is without uremic symptoms or signs of volume overload.  There are no acute indications to initiate dialysis emergently, especially since his K and Cr have improved from previous results since 10/21 and can stay at [facility].   We can try to arrange for a tunneled HD catheter with Dr. Constance Haw to start HD either tomorrow or Monday depending upon the schedule.  Or if his K and Scr improve, he can have these arranged as an outpatient by his primary nephrologist.    He does have advanced CKD stage V and his renal function has been worsening over the past 2 months and will need SW to assist with placement for outpatient HD with [facility] after he has access placement.  I will see him at [facility] tomorrow.  Continue with current treatment and will start sodium bicarb for metabolic acidosis and hyperkalemia (which have both been chronic).

## 2021-02-07 NOTE — H&P (Signed)
TRH H&P   Patient Demographics:    Lucas [name], is a 70 y.o. male  MRN: [id number]   DOB - May 30, 1950  Admit Date - 02/07/2021  Outpatient Primary MD for the patient is Mal Amabile, Anthony Sar, MD  Referring MD/NP/PA: PA Peosperi  Outpatient Specialists: renal   Patient coming from: [facility]  Chief Complaint  Patient presents with   Abnormal Lab      HPI:    Lucas [name]  is a 70 y.o. 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, but none over last 6 years as he is living at skilled nursing facility, is living at [facility], patient was sent by his physician due to worsening renal function , patient himself denies any complaints, but he is with dementia at baseline and not a great historian, but he denies nausea, vomiting, urinary retention, shortness of breath, patient being followed by nephrology as an outpatient with underlining CKD stage V, by reviewing labs and reports from his outpatient nephrologist, does appear patient with recent potassium 6.5, and creatinine of 5.2 couple weeks ago, as well as some labs from SNF with potassium of 5.9, and creatinine of 4.8, currently patient had repeat labs done at nephrologist office, which prompted him to send him to ED for further evaluation. - in ED patient work-up was significant for potassium of 5.5, creatinine of 4.38, phosphorus level is normal at 3.5, BUN at 58, hemoglobin around baseline of 8.4, EKG showing peaked T waves, patient received Kayexalate, calcium gluconate, bicarb and via insulin with D50, [facility] hospital[facility] consulted to admit.     Review of systems:    In addition to the HPI above, No Fever-chills, No Headache, No changes with Vision or  hearing, No problems swallowing food or Liquids, No Chest pain, Cough or Shortness of Breath, No Abdominal pain, No Nausea or Vommitting, Bowel movements are regular, No Blood in stool or Urine, No dysuria, No new skin rashes or bruises, No new joints pains-aches,  No new weakness, tingling, numbness in any extremity, No recent weight gain or loss, No polyuria, polydypsia or polyphagia, No significant Mental Stressors.  A full 10 point Review of Systems was done, except as stated above, all other Review of Systems were negative.   With Past History of the following :    Past Medical History:  Diagnosis Date   Alcohol dependence ([place])    Altered mental status    Anorexia    Carotid artery stenosis    Cataract    CHF (congestive heart failure) (HCC)    Chronic diastolic heart failure (HCC)    Chronic kidney disease, stage IV (severe) (HCC)    Cognitive deficits    CVA (cerebral infarction) 11/2010   CT 8/12 : Sm ischemic  infarct posterior limb of L internal capsule. Carotids nl. ECHO nl with EF 55-60%. R sided weakness.    Dementia ([place])    Dementia ([place])    Dysphagia    Failure to thrive in adult    GI hemorrhage    Hyperlipemia    Hyperlipidemia 01/06/2011   Diagnosed during acute CVA hospitalization. On statin.   Hypertension 11/2010   Diagnosed during acute CVA hospitalization   Metabolic encephalopathy    Occlusion of anterior cerebral artery    Peripheral vascular disease (HCC)    Polysubstance abuse ([place]) 01/06/2011   cocaine, tobacco, THC   Psychotic disorder ([place])    PVD (peripheral vascular disease) ([place])    Right hemiplegia ([place])    Schizoaffective disorder ([place])    Stroke [facility])       Past Surgical History:  Procedure Laterality Date   ESOPHAGOGASTRODUODENOSCOPY (EGD) WITH PROPOFOL N/A 06/25/2017   Dr. Oneida Alar: small hiatal hernia, gastritis/duodenititis. benign biopsy without H.pylori.       Social History:     Social History   Tobacco Use    Smoking status: Some Days    Packs/day: 0.50    Years: 40.00    Pack years: 20.00    Types: Cigarettes    Start date: 01/02/1965   Smokeless tobacco: Never   Tobacco comments:    2-3 per week  Substance Use Topics   Alcohol use: Yes    Alcohol/week: 2.0 standard drinks    Types: 2 Standard drinks or equivalent per week    Comment: beer     Lives -[place]     Family History :     Family History  Problem Relation Age of Onset   Hypertension Mother    Hypertension Father      Home Medications:   Prior to Admission medications   Medication Sig Start Date End Date Taking? Authorizing Provider  acetaminophen (TYLENOL) 325 MG tablet Take 650 mg by mouth every 4 (four) hours as needed. 05/21/19   [provider]  AMINO ACIDS-PROTEIN HYDROLYS PO Take 30 mLs by mouth daily.    [provider]  amLODipine (NORVASC) 10 MG tablet Take 1 tablet (10 mg total) by mouth daily. Patient taking differently: Take 5 mg by mouth daily. 06/12/14   Funches, Adriana Mccallum, MD  atorvastatin (LIPITOR) 40 MG tablet Take 1 tablet (40 mg total) by mouth daily at 6 PM. 06/02/17   Patrecia Pour, Christean Grief, MD  CALMOSEPTINE 0.44-20.6 % OINT Apply 1 application topically daily.  05/18/19   [provider]  carvedilol (COREG) 6.25 MG tablet Take 1 tablet (6.25 mg total) by mouth 2 (two) times daily with a meal. 09/03/19   Tat, Shanon Brow, MD  cholestyramine light (PREVALITE) 4 g packet Take 1 packet by mouth daily. 07/27/19   [provider]  cloNIDine (CATAPRES - DOSED IN MG/24 HR) 0.2 mg/24hr patch Place 0.2 mg onto the skin once a week.  05/26/18   [provider]  cloNIDine (CATAPRES) 0.2 MG tablet Take 1 tablet (0.2 mg total) by mouth 2 (two) times daily. Patient taking differently: Take 0.1 mg by mouth every 6 (six) hours as needed (blood pressure). 06/02/17   Doreatha Lew, MD  clopidogrel (PLAVIX) 75 MG tablet Take 1 tablet (75 mg total) by mouth daily. 06/03/17   Doreatha Lew, MD  divalproex (DEPAKOTE SPRINKLE) 125 MG capsule Take 500 mg by mouth daily.     [provider]  divalproex (DEPAKOTE) 250 MG DR  tablet Take 250 mg by mouth daily.    [provider]  hydrALAZINE (APRESOLINE) 100 MG tablet Take 100 mg by mouth 3 (three) times daily.    [provider]  mirtazapine (REMERON) 7.5 MG tablet Take 7.5 mg by mouth at bedtime. 07/27/19   [provider]  Multiple Vitamins-Minerals (MULTIPLE VIT/MINERALS/NO IRON) TABS Take 1 tablet by mouth daily. 05/18/19   [provider]  NORCO 5-325 MG tablet Take 0.5 tablets by mouth 3 (three) times daily. 09/03/19   Orson Eva, MD  omeprazole (PRILOSEC) 40 MG capsule Take 40 mg by mouth 2 (two) times daily. 07/27/19   [provider]  Skin Protectants, Misc. (MINERIN CREME) CREA Apply 1 application topically in the morning and at bedtime.  05/21/19   [provider]     Allergies:     Allergies  Allergen Reactions   Pork-Derived Products Other (See Comments)    Patient does not eat pork products.     Physical Exam:   Vitals  Blood pressure (!) 183/64, pulse 62, resp. rate 16, SpO2 96 %.   1. General frail, elderly male, laying in bed in no apparent distress  2..  Cognition and insight, pleasant, cooperative, oriented x2   3. No F.N deficits, ALL C.Nerves Intact, Strength 5/5 all 4 extremities, Sensation intact all 4 extremities, Plantars down going.  4. Ears and Eyes appear Normal, Conjunctivae clear, PERRLA. Moist Oral Mucosa.  5. Supple Neck, No JVD, No cervical lymphadenopathy appriciated, No Carotid Bruits.  6. Symmetrical Chest wall movement, Good air movement bilaterally, CTAB.  7. RRR, No Gallops, Rubs or Murmurs, No Parasternal Heave.  8. Positive Bowel Sounds, Abdomen Soft, No tenderness, No organomegaly appriciated,No rebound -guarding or rigidity.  9.  No Cyanosis, Normal Skin Turgor, No Skin Rash or Bruise.  10. Good muscle  tone,  joints appear normal , no effusions, Normal ROM.  11. No Palpable Lymph Nodes in Neck or Axillae    Data Review:    CBC Recent Labs  Lab 02/07/21 1257  WBC 5.7  HGB 8.4*  HCT 28.0*  PLT 153  MCV 91.8  MCH 27.5  MCHC 30.0  RDW 13.7   ------------------------------------------------------------------------------------------------------------------  Chemistries  Recent Labs  Lab 02/07/21 1257  NA 141  K 5.5*  CL 110  CO2 21*  GLUCOSE 97  BUN 58*  CREATININE 4.38*  CALCIUM 7.9*  MG 2.9*  AST 13*  ALT 8  ALKPHOS 50  BILITOT 0.0*   ------------------------------------------------------------------------------------------------------------------ CrCl cannot be calculated (Unknown ideal weight.). ------------------------------------------------------------------------------------------------------------------ No results for input(s): TSH, T4TOTAL, T3FREE, THYROIDAB in the last 72 hours.  Invalid input(s): FREET3  Coagulation profile No results for input(s): INR, PROTIME in the last 168 hours. ------------------------------------------------------------------------------------------------------------------- No results for input(s): DDIMER in the last 72 hours. -------------------------------------------------------------------------------------------------------------------  Cardiac Enzymes No results for input(s): CKMB, TROPONINI, MYOGLOBIN in the last 168 hours.  Invalid input(s): CK ------------------------------------------------------------------------------------------------------------------    Component Value Date/Time   BNP 166.0 (H) 06/15/2017 0955     ---------------------------------------------------------------------------------------------------------------  Urinalysis    Component Value Date/Time   COLORURINE YELLOW 09/01/2019 1900   APPEARANCEUR CLEAR 09/01/2019 1900   LABSPEC 1.012 09/01/2019 1900   PHURINE 5.0 09/01/2019 1900    GLUCOSEU NEGATIVE 09/01/2019 1900   HGBUR SMALL (A) 09/01/2019 1900   BILIRUBINUR NEGATIVE 09/01/2019 1900   KETONESUR NEGATIVE 09/01/2019 1900   PROTEINUR >=300 (A) 09/01/2019 1900   UROBILINOGEN 0.2 12/09/2010 1117   NITRITE NEGATIVE 09/01/2019 1900   LEUKOCYTESUR NEGATIVE 09/01/2019  1900    ----------------------------------------------------------------------------------------------------------------   Imaging Results:    No results found.  My personal review of EKG: Rhythm NSR,  PR interval 152 ms QRS duration 101 ms QT/QTcB 469/441 ms P-R-T axes 53 53 59 Sinus rhythm Probable left ventricular hypertrophy peaked T wave No significant change since last tracing  Assessment & Plan:    Active Problems:   Hyperlipidemia with target LDL less than 100   Cerebrovascular accident (CVA) ([place])   Acute on chronic renal failure (HCC)   Hyperkalemia  .  AKI in CKD stage V with hyperkalemia -Patient is followed by Dr. Theador Hawthorne, he is with progressive renal failure, recent labs as an outpatient showing potassium of 5.9, creatinine 4.8, (baseline. -Give gentle IV hydration, discussed with Dr. [place], no indication for emergent hemodialysis, he will be evaluated officially by nephrology tomorrow regarding further recommendation -Patient with hyperkalemia, mild peaked T waves, he already received calcium gluconate, bicarbonate, D50 with IV insulin and Kayexalate, recheck BMP in 4 hours and monitor in telemetry.  Vascular  dementia - patient with impaired cognition and insight, but nothing acute, I have discussed with daughter he reports she was told that patient has dementia over last 2 years.. -Continue with Plavix  Sinus bradycardia -Heart rate currently in the 50s, he does not appear to be symptomatic, I will hold and the blockers for now.  Hypertension -Blood pressure significantly elevated, will add as needed hydralazine and continue with home  medications  Hyperlipidemia -Restart statin   Anemia of CKD -Hemoglobin 8.4, likely will benefit from Aranesp.   Hyperkalemia -Improved with temporizing measures and Lokelma     DVT Prophylaxis Heparin   AM Labs Ordered, also please review Full Orders  Family Communication: Admission, patients condition and plan of care including tests being ordered have been discussed with the patient and daughter by phoneWho indicate understanding and agree with the plan and Code Status.  Code Status Full  Likely DC to  back to [place] called: renal by ED    Admission status: observation    Time spent in minutes : 60 minutes   Phillips Climes M.D on 02/07/2021 at 3:43 PM   [facility] - Office  [phone]

## 2021-02-07 NOTE — ED Provider Notes (Addendum)
[facility] EMERGENCY DEPARTMENT Provider Note   CSN: [id number] Arrival date & time: 02/07/21  1224     History Chief Complaint  Patient presents with   Abnormal Lab    Lucas [name] is a 70 y.o. male with a past medical history of CVA, stroke, dementia, failure to thrive, hypertension, chronic kidney disease who was seen at his kidney clinic earlier today and sent for further evaluation due to hyponatremia, hyperkalemia.  Patient denies abdominal pain, dysuria, chest pain, shortness of breath, reports that he is not receiving dialysis in the past, however he has been speaking with his kidney clinic about going into that direction.  Patient is not able to provide much more history secondary to cognitive deficit.  Level 5 caveat: Dementia, cognitive deficit   Abnormal Lab     Past Medical History:  Diagnosis Date   Alcohol dependence ([place])    Altered mental status    Anorexia    Carotid artery stenosis    Cataract    CHF (congestive heart failure) (HCC)    Chronic diastolic heart failure (HCC)    Chronic kidney disease, stage IV (severe) (HCC)    Cognitive deficits    CVA (cerebral infarction) 11/2010   CT 8/12 : Sm ischemic infarct posterior limb of L internal capsule. Carotids nl. ECHO nl with EF 55-60%. R sided weakness.    Dementia ([place])    Dementia ([place])    Dysphagia    Failure to thrive in adult    GI hemorrhage    Hyperlipemia    Hyperlipidemia 01/06/2011   Diagnosed during acute CVA hospitalization. On statin.   Hypertension 11/2010   Diagnosed during acute CVA hospitalization   Metabolic encephalopathy    Occlusion of anterior cerebral artery    Peripheral vascular disease (HCC)    Polysubstance abuse ([place]) 01/06/2011   cocaine, tobacco, THC   Psychotic disorder ([place])    PVD (peripheral vascular disease) ([place])    Right hemiplegia ([place])    Schizoaffective disorder ([place])    Stroke [facility])     Patient Active Problem List   Diagnosis Date Noted   Acute  renal failure superimposed on stage 4 chronic kidney disease ([place]) 84/16/6063   Acute metabolic encephalopathy 01/60/1093   Hyperkalemia    Hypertensive urgency 08/29/2019   Acute on chronic renal failure ([place]) 08/29/2019   Hypoglycemia 08/28/2019   Normocytic anemia 11/03/2017   UGI bleed 06/24/2017   E coli bacteremia 06/17/2017   Altered mental status 06/15/2017   Pressure injury of skin 06/15/2017   AKI (acute kidney injury) ([place]) 06/15/2017   CKD (chronic kidney disease) stage 4, GFR 15-29 ml/min ([place]) 06/15/2017   History of stroke 06/15/2017   Acute lower UTI 06/15/2017   Dehydration 06/15/2017   Cocaine abuse ([place])    Noncompliance w/medication treatment due to intermit use of medication    Middle cerebral artery stenosis, right    Stenosis of right carotid artery    Cerebrovascular accident (CVA) ([place])    Malignant hypertension 05/24/2017   Smoker 06/12/2014   Memory loss or impairment 02/13/2013   Right shoulder pain 02/21/2011   HTN (hypertension) 01/06/2011   Hyperlipidemia with target LDL less than 100 01/06/2011   Polysubstance abuse ([place]) 01/06/2011   CVA (cerebral infarction) 01/06/2011   Preventative health care 01/06/2011    Past Surgical History:  Procedure Laterality Date   ESOPHAGOGASTRODUODENOSCOPY (EGD) WITH PROPOFOL N/A 06/25/2017   Dr. Oneida Alar: small hiatal hernia, gastritis/duodenititis. benign biopsy without H.pylori.  Family History  Problem Relation Age of Onset   Hypertension Mother    Hypertension Father     Social History   Tobacco Use   Smoking status: Some Days    Packs/day: 0.50    Years: 40.00    Pack years: 20.00    Types: Cigarettes    Start date: 01/02/1965   Smokeless tobacco: Never   Tobacco comments:    2-3 per week  Vaping Use   Vaping Use: Never used  Substance Use Topics   Alcohol use: Yes    Alcohol/week: 2.0 standard drinks    Types: 2 Standard drinks or equivalent per week    Comment: beer   Drug use:  Not Currently    Types: Cocaine    Home Medications Prior to Admission medications   Medication Sig Start Date End Date Taking? Authorizing Provider  ascorbic acid (VITAMIN C) 500 MG tablet Take 1 tablet (500 mg total) by mouth daily. 02/08/21  Yes Johnson, Clanford L, MD  calcium carbonate (TUMS) 500 MG chewable tablet Chew 2 tablets (400 mg of elemental calcium total) by mouth 3 (three) times daily. 02/08/21 02/08/22 Yes Johnson, Clanford L, MD  Cholecalciferol 25 MCG (1000 UT) CHEW Chew 1 tablet (1,000 Units total) by mouth daily. 02/08/21  Yes Johnson, Clanford L, MD  divalproex (DEPAKOTE) 500 MG DR tablet Take 1 tablet (500 mg total) by mouth at bedtime. For behaviors. 02/08/21  Yes Johnson, Clanford L, MD  epoetin alfa (EPOGEN) 2000 UNIT/ML injection Inject 1 mL (2,000 Units total) into the skin every 14 (fourteen) days. Give along with 4000 units = 6000 units 02/08/21  Yes Johnson, Clanford L, MD  epoetin alfa (EPOGEN) 4000 UNIT/ML injection Inject 1 mL (4,000 Units total) into the vein every 14 (fourteen) days. 02/08/21  Yes Johnson, Clanford L, MD  famotidine (PEPCID) 20 MG tablet Take 1 tablet (20 mg total) by mouth 2 (two) times daily. 02/08/21 02/08/22 Yes Johnson, Clanford L, MD  ferrous sulfate 325 (65 FE) MG EC tablet Take 1 tablet (325 mg total) by mouth daily with breakfast. 02/08/21 02/08/22 Yes Johnson, Clanford L, MD  Patiromer Sorbitex Calcium (VELTASSA) 16.8 g PACK Take 1 packet by mouth daily. 02/08/21  Yes Johnson, Clanford L, MD  sertraline (ZOLOFT) 100 MG tablet Take 1.5 tablets (150 mg total) by mouth daily. 02/08/21 02/08/22 Yes Johnson, Clanford L, MD  acetaminophen (TYLENOL) 325 MG tablet Take 650 mg by mouth every 4 (four) hours as needed. 05/21/19   [provider]  AMINO ACIDS-PROTEIN HYDROLYS PO Take 30 mLs by mouth daily.    [provider]  amLODipine (NORVASC) 10 MG tablet Take 1 tablet (10 mg total) by mouth daily. 02/08/21   Johnson, Clanford  L, MD  atorvastatin (LIPITOR) 40 MG tablet Take 1 tablet (40 mg total) by mouth daily at 6 PM. 06/02/17   Patrecia Pour, Christean Grief, MD  CALMOSEPTINE 0.44-20.6 % OINT Apply 1 application topically daily.  05/18/19   [provider]  carvedilol (COREG) 6.25 MG tablet Take 1 tablet (6.25 mg total) by mouth 2 (two) times daily with a meal. 09/03/19   Tat, Shanon Brow, MD  cholestyramine light (PREVALITE) 4 g packet Take 1 packet by mouth daily. 07/27/19   [provider]  cloNIDine (CATAPRES - DOSED IN MG/24 HR) 0.2 mg/24hr patch Place 0.2 mg onto the skin once a week.  05/26/18   [provider]  cloNIDine (CATAPRES) 0.2 MG tablet Take 1 tablet (0.2 mg total) by mouth  2 (two) times daily. Patient taking differently: Take 0.1 mg by mouth every 6 (six) hours as needed (blood pressure). 06/02/17   Doreatha Lew, MD  clopidogrel (PLAVIX) 75 MG tablet Take 1 tablet (75 mg total) by mouth daily. 06/03/17   Doreatha Lew, MD  divalproex (DEPAKOTE SPRINKLE) 125 MG capsule Take 500 mg by mouth daily.     [provider]  divalproex (DEPAKOTE) 125 MG DR tablet Take 1 tablet (125 mg total) by mouth daily. Take with 250 mg = 375 mg 02/08/21   Johnson, Clanford L, MD  divalproex (DEPAKOTE) 250 MG DR tablet Take 250 mg by mouth daily.    [provider]  hydrALAZINE (APRESOLINE) 100 MG tablet Take 100 mg by mouth 3 (three) times daily.    [provider]  mirtazapine (REMERON) 7.5 MG tablet Take 7.5 mg by mouth at bedtime. 07/27/19   [provider]  Multiple Vitamins-Minerals (MULTIPLE VIT/MINERALS/NO IRON) TABS Take 1 tablet by mouth daily. 05/18/19   [provider]  NORCO 5-325 MG tablet Take 0.5 tablets by mouth 3 (three) times daily. 09/03/19   Orson Eva, MD  omeprazole (PRILOSEC) 40 MG capsule Take 40 mg by mouth 2 (two) times daily. 07/27/19   [provider]  Skin Protectants, Misc. (MINERIN CREME) CREA Apply 1 application topically in  the morning and at bedtime.  05/21/19   [provider]  sodium bicarbonate 325 MG tablet Take 2 tablets (650 mg total) by mouth 2 (two) times daily. 02/08/21   Murlean Iba, MD    Allergies    Pork-derived products  Review of Systems   Review of Systems  Reason unable to perform ROS: Dementia, cognitive deficit.   Physical Exam Updated Vital Signs BP (!) 151/52   Pulse 69   Temp 98.3 F (36.8 C) (Oral)   Resp 17   Ht 5\' 5"  (1.651 m)   Wt 49.3 kg   SpO2 100%   BMI 18.09 kg/m   Physical Exam Vitals and nursing note reviewed.  Constitutional:      General: He is not in acute distress.    Appearance: Normal appearance. He is ill-appearing.     Comments: Somewhat ill-appearing, slow to respond man sitting on bed in no acute distress.  HENT:     Head: Normocephalic and atraumatic.  Eyes:     General:        Right eye: No discharge.        Left eye: No discharge.  Cardiovascular:     Rate and Rhythm: Normal rate and regular rhythm.     Heart sounds: No murmur heard.   No friction rub. No gallop.  Pulmonary:     Effort: Pulmonary effort is normal.     Breath sounds: Normal breath sounds.  Abdominal:     General: Bowel sounds are normal.     Palpations: Abdomen is soft.     Comments: Neurolyse tenderness to palpation of abdomen, patient unable to localize.  No rebound, rigidity, guarding, no signs of hemorrhage on the abdominal wall.  Skin:    General: Skin is warm and dry.     Capillary Refill: Capillary refill takes less than 2 seconds.     Comments: Patient does not have an AV fistula.  Neurological:     Mental Status: He is alert and oriented to person, place, and time.  Psychiatric:        Mood and Affect: Mood normal.  Behavior: Behavior normal.    ED Results / Procedures / Treatments   Labs (all labs ordered are listed, but only abnormal results are displayed) Labs Reviewed  CBC - Abnormal; Notable for the following components:       Result Value   RBC 3.05 (*)    Hemoglobin 8.4 (*)    HCT 28.0 (*)    All other components within normal limits  COMPREHENSIVE METABOLIC PANEL - Abnormal; Notable for the following components:   Potassium 5.5 (*)    CO2 21 (*)    BUN 58 (*)    Creatinine, Ser 4.38 (*)    Calcium 7.9 (*)    AST 13 (*)    Total Bilirubin 0.0 (*)    GFR, Estimated 14 (*)    All other components within normal limits  MAGNESIUM - Abnormal; Notable for the following components:   Magnesium 2.9 (*)    All other components within normal limits  BLOOD GAS, VENOUS - Abnormal; Notable for the following components:   pCO2, Ven 42.5 (*)    pO2, Ven 45.6 (*)    All other components within normal limits  IRON AND TIBC - Abnormal; Notable for the following components:   TIBC 200 (*)    All other components within normal limits  FERRITIN - Abnormal; Notable for the following components:   Ferritin 616 (*)    All other components within normal limits  BASIC METABOLIC PANEL - Abnormal; Notable for the following components:   BUN 57 (*)    Creatinine, Ser 4.40 (*)    GFR, Estimated 14 (*)    All other components within normal limits  RENAL FUNCTION PANEL - Abnormal; Notable for the following components:   Chloride 112 (*)    BUN 56 (*)    Creatinine, Ser 4.44 (*)    Calcium 8.4 (*)    GFR, Estimated 14 (*)    All other components within normal limits  CBC - Abnormal; Notable for the following components:   RBC 3.48 (*)    Hemoglobin 9.6 (*)    HCT 31.6 (*)    All other components within normal limits  RESP PANEL BY RT-PCR (FLU A&B, COVID) ARPGX2  PHOSPHORUS  HEPATITIS B SURFACE ANTIGEN  HEPATITIS B SURFACE ANTIBODY,QUALITATIVE  HEPATITIS B CORE ANTIBODY, IGM  HEPATITIS C ANTIBODY  GLUCOSE, CAPILLARY  URINALYSIS, COMPLETE (UACMP) WITH MICROSCOPIC  CREATININE, URINE, RANDOM  SODIUM, URINE, RANDOM  HIV ANTIBODY (ROUTINE TESTING W REFLEX)    EKG EKG Interpretation  Date/Time:  Friday February 07 2021 12:43:50 EDT Ventricular Rate:  53 PR Interval:  152 QRS Duration: 101 QT Interval:  469 QTC Calculation: 441 R Axis:   53 Text Interpretation: Sinus rhythm Probable left ventricular hypertrophy peaked T wave No significant change since last tracing Confirmed by Varney Biles [phone]) on 02/07/2021 1:47:54 PM  Radiology No results found.  Procedures .Critical Care Performed by: Anselmo Pickler, PA-C Authorized by: Anselmo Pickler, PA-C   Critical care provider statement:    Critical care time (minutes):  34   Critical care was necessary to treat or prevent imminent or life-threatening deterioration of the following conditions: hyper K requiring IV medication.   Critical care was time spent personally by me on the following activities:  Blood draw for specimens, ordering and performing treatments and interventions, discussions with consultants, re-evaluation of patient's condition, review of old charts, examination of patient, evaluation of patient's response to treatment and interpretation of cardiac output measurements  Care discussed with: admitting provider     Medications Ordered in ED Medications  atorvastatin (LIPITOR) tablet 40 mg (has no administration in time range)  pantoprazole (PROTONIX) EC tablet 40 mg (40 mg Oral Given 02/08/21 1008)  clopidogrel (PLAVIX) tablet 75 mg (75 mg Oral Given 02/08/21 1008)  heparin injection 5,000 Units (5,000 Units Subcutaneous Not Given 02/08/21 1325)  sodium bicarbonate tablet 650 mg (650 mg Oral Given 02/08/21 1547)  0.9 %  sodium chloride infusion ( Intravenous Stopped 02/08/21 1548)  hydrALAZINE (APRESOLINE) injection 10 mg (10 mg Intravenous Given 02/08/21 1008)  Chlorhexidine Gluconate Cloth 2 % PADS 6 each (6 each Topical Given 02/08/21 1008)  feeding supplement (NEPRO CARB STEADY) liquid 237 mL (237 mLs Oral New Bag/Given 02/08/21 1402)  sodium polystyrene (KAYEXALATE) 15 GM/60ML suspension 15 g (15 g Oral Given  02/07/21 1521)  calcium gluconate 1 g/ 50 mL sodium chloride IVPB (0 mg Intravenous Stopped 02/07/21 1614)  insulin aspart (novoLOG) injection 5 Units (5 Units Intravenous Given 02/07/21 1616)    And  dextrose 50 % solution 50 mL (50 mLs Intravenous Given 02/07/21 1616)  sodium bicarbonate injection 50 mEq (50 mEq Intravenous Given 02/07/21 1616)  influenza vaccine adjuvanted (FLUAD) injection 0.5 mL (0.5 mLs Intramuscular Given 02/08/21 1549)    ED Course  I have reviewed the triage vital signs and the nursing notes.  Pertinent labs & imaging results that were available during my care of the patient were reviewed by me and considered in my medical decision making (see chart for details).    MDM Rules/Calculators/A&P                         I discussed this case with my attending physician who cosigned this note including patient's presenting symptoms, physical exam, and planned diagnostics and interventions. Attending physician stated agreement with plan or made changes to plan which were implemented.   Attending physician assessed patient at bedside.   Baseline somewhat unresponsive with cognitive deficit, history of previous strokes.  Patient was sent over by nephrologist for abnormal labs, hypernatremia, hyperkalemia.  Patient does have initial lab work consistent for potassium of 5.5, elevated BUN, creatinine.  Creatinine is 4.38 today up from 2.651-year ago.  Initial lab work also significant for anemia, hemoglobin of 8.4.  Spoke with Dr. Marval Regal regarding patient disposition in context of hyperkalemia, worsening kidney function.  Patient does not currently have access for dialysis.  Dr. Marval Regal was able to review patient's charts from [facility] kidney and saw that he has been being treated for hyperkalemia, it was worsened to 6.5 a few weeks ago, he has been being treated with [facility] which he is not tolerating very well.  Some minimal improvement of kidney function from  creatinine of 5 a few weeks ago, however still significant decrease from baseline.  Dr. Marval Regal who wants to temporize patient's hyperkalemia, admit, and arrange for dialysis on [name].  Spoke with hospitalist who wants to confirm and discussed the plan with Dr. Marval Regal prior to admission.  Patient continues to show stable EKG findings, and denies any symptoms including nausea, vomiting.  Calcium gluconate, sodium bicarb, insulin with dextrose, as well as Kayexalate were started to temporize potassium.  Patient is admitted at this time. Final Clinical Impression(s) / ED Diagnoses Final diagnoses:  Hyperkalemia    Rx / DC Orders ED Discharge Orders          Ordered    amLODipine (NORVASC) 10 MG  tablet  Daily        02/08/21 1428    Cholecalciferol 25 MCG (1000 UT) CHEW  Daily        02/08/21 1428    divalproex (DEPAKOTE) 125 MG DR tablet  Daily,   Status:  Discontinued        02/08/21 1428    divalproex (DEPAKOTE) 500 MG DR tablet  Daily at bedtime        02/08/21 1428    sodium bicarbonate 325 MG tablet  2 times daily        02/08/21 1428    ascorbic acid (VITAMIN C) 500 MG tablet  Daily        02/08/21 1428    calcium carbonate (TUMS) 500 MG chewable tablet  3 times daily        02/08/21 1428    famotidine (PEPCID) 20 MG tablet  2 times daily        02/08/21 1428    ferrous sulfate 325 (65 FE) MG EC tablet  Daily with breakfast        02/08/21 1428    sertraline (ZOLOFT) 100 MG tablet  Daily        02/08/21 1428    Patiromer Sorbitex Calcium (VELTASSA) 16.8 g PACK  Daily        02/08/21 1428    epoetin alfa (EPOGEN) 2000 UNIT/ML injection  Every 14 days        02/08/21 1428    epoetin alfa (EPOGEN) 4000 UNIT/ML injection  Every 14 days        02/08/21 1428    divalproex (DEPAKOTE) 125 MG DR tablet  Daily        02/08/21 1430             Barnett Elzey, [place] H, PA-C 02/07/21 1540    Emmitte Surgeon, [place], PA-C 02/08/21 [place], [place], MD 02/09/21  [phone]

## 2021-02-07 NOTE — ED Notes (Signed)
Changed pt into gown and cleaned pt. Placed a male wick on him. As well, given a warm blanket to pt.

## 2021-02-07 NOTE — ED Triage Notes (Signed)
Pt from Drs office via [place]. Pt states his Dr told him he needed dialysis due to his abnormal blood work. Pt denies pain.

## 2021-02-08 ENCOUNTER — Encounter (HOSPITAL_COMMUNITY): Payer: Self-pay | Admitting: Internal Medicine

## 2021-02-08 DIAGNOSIS — N170 Acute kidney failure with tubular necrosis: Secondary | ICD-10-CM | POA: Diagnosis not present

## 2021-02-08 DIAGNOSIS — E785 Hyperlipidemia, unspecified: Secondary | ICD-10-CM | POA: Diagnosis not present

## 2021-02-08 DIAGNOSIS — E875 Hyperkalemia: Secondary | ICD-10-CM | POA: Diagnosis not present

## 2021-02-08 DIAGNOSIS — N184 Chronic kidney disease, stage 4 (severe): Secondary | ICD-10-CM | POA: Diagnosis not present

## 2021-02-08 LAB — HEPATITIS C ANTIBODY: HCV Ab: NONREACTIVE

## 2021-02-08 LAB — CBC
HCT: 31.6 % — ABNORMAL LOW (ref 39.0–52.0)
Hemoglobin: 9.6 g/dL — ABNORMAL LOW (ref 13.0–17.0)
MCH: 27.6 pg (ref 26.0–34.0)
MCHC: 30.4 g/dL (ref 30.0–36.0)
MCV: 90.8 fL (ref 80.0–100.0)
Platelets: 179 10*3/uL (ref 150–400)
RBC: 3.48 MIL/uL — ABNORMAL LOW (ref 4.22–5.81)
RDW: 13.9 % (ref 11.5–15.5)
WBC: 6.3 10*3/uL (ref 4.0–10.5)
nRBC: 0 % (ref 0.0–0.2)

## 2021-02-08 LAB — GLUCOSE, CAPILLARY: Glucose-Capillary: 78 mg/dL (ref 70–99)

## 2021-02-08 LAB — RENAL FUNCTION PANEL
Albumin: 3.6 g/dL (ref 3.5–5.0)
Anion gap: 7 (ref 5–15)
BUN: 56 mg/dL — ABNORMAL HIGH (ref 8–23)
CO2: 25 mmol/L (ref 22–32)
Calcium: 8.4 mg/dL — ABNORMAL LOW (ref 8.9–10.3)
Chloride: 112 mmol/L — ABNORMAL HIGH (ref 98–111)
Creatinine, Ser: 4.44 mg/dL — ABNORMAL HIGH (ref 0.61–1.24)
GFR, Estimated: 14 mL/min — ABNORMAL LOW (ref >60)
Glucose, Bld: 85 mg/dL (ref 70–99)
Phosphorus: 3.9 mg/dL (ref 2.5–4.6)
Potassium: 4.9 mmol/L (ref 3.5–5.1)
Sodium: 144 mmol/L (ref 135–145)

## 2021-02-08 LAB — HIV ANTIBODY (ROUTINE TESTING W REFLEX): HIV Screen 4th Generation wRfx: NONREACTIVE

## 2021-02-08 LAB — HEPATITIS B CORE ANTIBODY, IGM: Hep B C IgM: NONREACTIVE

## 2021-02-08 MED ORDER — DIVALPROEX SODIUM 125 MG PO DR TAB: 125.0000 mg | DELAYED_RELEASE_TABLET | Freq: Every day | ORAL | Status: DC

## 2021-02-08 MED ORDER — FERROUS SULFATE 325 (65 FE) MG PO TBEC: 325.0000 mg | DELAYED_RELEASE_TABLET | Freq: Every day | ORAL | 3 refills | Status: DC

## 2021-02-08 MED ORDER — SERTRALINE HCL 100 MG PO TABS: 150.0000 mg | ORAL_TABLET | Freq: Every day | ORAL | 2 refills | Status: DC

## 2021-02-08 MED ORDER — CHOLECALCIFEROL 25 MCG (1000 UT) PO CHEW: 1.0000 | CHEWABLE_TABLET | Freq: Every day | ORAL | Status: DC

## 2021-02-08 MED ORDER — EPOGEN 4000 UNIT/ML IJ SOLN: 4000.0000 [IU] | INTRAMUSCULAR | Status: DC

## 2021-02-08 MED ORDER — CHLORHEXIDINE GLUCONATE CLOTH 2 % EX PADS
6.0000 | MEDICATED_PAD | Freq: Every day | CUTANEOUS | Status: DC
  Administered 2021-02-08: 6 via TOPICAL

## 2021-02-08 MED ORDER — NEPRO/CARBSTEADY PO LIQD
237.0000 mL | Freq: Two times a day (BID) | ORAL | Status: DC
  Administered 2021-02-08 (×2): 237 mL via ORAL

## 2021-02-08 MED ORDER — ASCORBIC ACID 500 MG PO TABS: 500.0000 mg | ORAL_TABLET | Freq: Every day | ORAL | Status: AC

## 2021-02-08 MED ORDER — AMLODIPINE BESYLATE 10 MG PO TABS: 10.0000 mg | ORAL_TABLET | Freq: Every day | ORAL | 3 refills | Status: DC

## 2021-02-08 MED ORDER — INFLUENZA VAC A&B SA ADJ QUAD 0.5 ML IM PRSY
0.5000 mL | PREFILLED_SYRINGE | INTRAMUSCULAR | Status: AC
  Administered 2021-02-08: 0.5 mL via INTRAMUSCULAR
  Filled 2021-02-08: qty 0.5

## 2021-02-08 MED ORDER — CALCIUM CARBONATE ANTACID 500 MG PO CHEW: 2.0000 | CHEWABLE_TABLET | Freq: Three times a day (TID) | ORAL | 3 refills | Status: AC

## 2021-02-08 MED ORDER — DIVALPROEX SODIUM 500 MG PO DR TAB: 500.0000 mg | DELAYED_RELEASE_TABLET | Freq: Every day | ORAL | Status: DC

## 2021-02-08 MED ORDER — FAMOTIDINE 20 MG PO TABS: 20.0000 mg | ORAL_TABLET | Freq: Two times a day (BID) | ORAL | 1 refills | Status: AC

## 2021-02-08 MED ORDER — SODIUM BICARBONATE 325 MG PO TABS: 650.0000 mg | ORAL_TABLET | Freq: Two times a day (BID) | ORAL | Status: DC

## 2021-02-08 MED ORDER — DIVALPROEX SODIUM 125 MG PO DR TAB: 250.0000 mg | DELAYED_RELEASE_TABLET | Freq: Every day | ORAL | Status: DC

## 2021-02-08 MED ORDER — EPOGEN 2000 UNIT/ML IJ SOLN: 2000.0000 [IU] | INTRAMUSCULAR | Status: DC

## 2021-02-08 MED ORDER — VELTASSA 16.8 G PO PACK: 1.0000 | PACK | Freq: Every day | ORAL | Status: AC

## 2021-02-08 NOTE — Progress Notes (Signed)
Report called to [employer] at [facility]. Discharge paperwork reviewed and placed in packet for EMS to give to facility upon transport.

## 2021-02-08 NOTE — Progress Notes (Signed)
PROGRESS NOTE   Lucas [name]  WJX:[id number] DOB: 01-Apr-1951 DOA: 02/07/2021 PCP: [name], MD   Chief Complaint  Patient presents with   Abnormal Lab   Level of care: Telemetry  Brief Admission History:  70 y.o. 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, but none over last 6 years as he is living at skilled nursing facility, is living at [facility], patient was sent by his physician due to worsening renal function , patient himself denies any complaints, but he is with dementia at baseline and not a great historian, but he denies nausea, vomiting, urinary retention, shortness of breath, patient being followed by nephrology as an outpatient with underlining CKD stage V, by reviewing labs and reports from his outpatient nephrologist, does appear patient with recent potassium 6.5, and creatinine of 5.2 couple weeks ago, as well as some labs from SNF with potassium of 5.9, and creatinine of 4.8, currently patient had repeat labs done at nephrologist office, which prompted him to send him to ED for further evaluation.  Assessment & Plan:   Active Problems:   Hyperlipidemia with target LDL less than 100   Cerebrovascular accident (CVA) ([place])   Acute on chronic renal failure (HCC)   Hyperkalemia   Stage V CKD  - labs stable, no uremic symptoms at this time - nephrology following  - no urgent indication for HD at this time  Hyperkalemia  - treated and resolved  Metabolic acidosis  - pt started on sodium bicarbonate with good results  Sinus bradycardia - stable on telemetry, asymptomatic  Essential hypertension - poorly controlled - still waiting for home meds to be reconciled, sent new request to have meds reconciled (pt is LTR at [facility])  Hyperlipidemia - atorvastatin 40 mg daily    DVT prophylaxis: SQ heparin  Code Status: full  Family Communication: none present during rounds Disposition: return to [facility]  Status is: Observation  The patient remains OBS appropriate and will d/c before 2 midnights.   Consultants:  Nephrology   Procedures:  N/a  Antimicrobials:  N/a    Subjective: Pt pleasantly confused.  No specific complaints.    Objective: Vitals:   02/08/21 0900 02/08/21 1000 02/08/21 1005 02/08/21 1030  BP: (!) 208/54 (!) 197/59 (!) 198/59 (!) 150/59  Pulse: 63 62 60 66  Resp: 18 17 15 17   Temp:      TempSrc:      SpO2: 100% 100% 100% 99%  Weight:      Height:        Intake/Output Summary (Last 24 hours) at 02/08/2021 1127 Last data filed at 02/08/2021 0914 Gross per 24 hour  Intake 880.62 ml  Output --  Net 880.62 ml   Filed Weights   02/08/21 0100  Weight: 49.3 kg    Examination:  General exam: with dementia, pleasantly confused, appears chronically ill, Appears calm and comfortable  Respiratory system: Clear to auscultation. Respiratory effort normal. Cardiovascular system: normal S1 & S2 heard. No JVD, murmurs, rubs, gallops or clicks. No pedal edema. Gastrointestinal system: Abdomen is nondistended, soft and nontender. No organomegaly or masses felt. Normal bowel sounds heard. Central nervous system: Alert and oriented. No focal neurological deficits. Extremities: Symmetric 5 x 5 power. Skin: No rashes, lesions or ulcers Psychiatry: Judgement and insight appear poor. Mood & affect appropriate.   Data Reviewed: I  have personally reviewed following labs and imaging studies  CBC: Recent Labs  Lab 02/07/21 1257 02/08/21 0804  WBC 5.7 6.3  HGB 8.4* 9.6*  HCT 28.0* 31.6*  MCV 91.8 90.8  PLT 153 322    Basic Metabolic Panel: Recent Labs  Lab 02/07/21 1257 02/07/21 1949 02/08/21 0804  NA 141 144 144  K 5.5* 4.7 4.9  CL 110 109 112*  CO2 21* 26 25  GLUCOSE 97 70 85  BUN 58* 57* 56*  CREATININE 4.38*  4.40* 4.44*  CALCIUM 7.9* 8.9 8.4*  MG 2.9*  --   --   PHOS 3.5  --  3.9    GFR: Estimated Creatinine Clearance: 11.1 mL/min (A) (by C-G formula based on SCr of 4.44 mg/dL (H)).  Liver Function Tests: Recent Labs  Lab 02/07/21 1257 02/08/21 0804  AST 13*  --   ALT 8  --   ALKPHOS 50  --   BILITOT 0.0*  --   PROT 7.1  --   ALBUMIN 3.7 3.6    CBG: Recent Labs  Lab 02/07/21 2117  GLUCAP 78    Recent Results (from the past 240 hour(s))  Resp Panel by RT-PCR (Flu A&B, Covid) Nasopharyngeal Swab     Status: None   Collection Time: 02/07/21  4:23 PM   Specimen: Nasopharyngeal Swab; Nasopharyngeal(NP) swabs in vial transport medium  Result Value Ref Range Status   SARS Coronavirus 2 by RT PCR NEGATIVE NEGATIVE Final    Comment: (NOTE) SARS-CoV-2 target nucleic acids are NOT DETECTED.  The SARS-CoV-2 RNA is generally detectable in upper respiratory specimens during the acute phase of infection. The lowest concentration of SARS-CoV-2 viral copies this assay can detect is 138 copies/mL. A negative result does not preclude SARS-Cov-2 infection and should not be used as the sole basis for treatment or other patient management decisions. A negative result may occur with  improper specimen collection/handling, submission of specimen other than nasopharyngeal swab, presence of viral mutation(s) within the areas targeted by this assay, and inadequate number of viral copies(<138 copies/mL). A negative result must be combined with clinical observations, patient history, and epidemiological information. The expected result is Negative.  Fact Sheet for Patients:  EntrepreneurPulse.com.au  Fact Sheet for Healthcare Providers:  IncredibleEmployment.be  This test is no t yet approved or cleared by the [place] FDA and  has been authorized for detection and/or diagnosis of SARS-CoV-2 by FDA under an Emergency Use Authorization (EUA). This EUA  will remain  in effect (meaning this test can be used) for the duration of the COVID-19 declaration under Section 564(b)(1) of the Act, 21 U.S.C.section 360bbb-3(b)(1), unless the authorization is terminated  or revoked sooner.       Influenza A by PCR NEGATIVE NEGATIVE Final   Influenza B by PCR NEGATIVE NEGATIVE Final    Comment: (NOTE) The Xpert Xpress SARS-CoV-2/FLU/RSV plus assay is intended as an aid in the diagnosis of influenza from Nasopharyngeal swab specimens and should not be used as a sole basis for treatment. Nasal washings and aspirates are unacceptable for Xpert Xpress SARS-CoV-2/FLU/RSV testing.  Fact Sheet for Patients: EntrepreneurPulse.com.au  Fact Sheet for Healthcare Providers: IncredibleEmployment.be  This test is not yet approved or cleared by the [place] FDA and has been authorized for detection and/or diagnosis of SARS-CoV-2 by FDA under an Emergency Use Authorization (EUA). This EUA will remain in effect (meaning this test can be used) for the duration of the COVID-19 declaration under Section 564(b)(1) of the Act,  21 U.S.C. section 360bbb-3(b)(1), unless the authorization is terminated or revoked.  Performed at [facility], [address], [place], [place] [address]      Radiology Studies: No results found.  Scheduled Meds:  atorvastatin  40 mg Oral q1800   Chlorhexidine Gluconate Cloth  6 each Topical Daily   clopidogrel  75 mg Oral Daily   feeding supplement (NEPRO CARB STEADY)  237 mL Oral BID BM   heparin  5,000 Units Subcutaneous Q8H   [START ON 02/09/2021] influenza vaccine adjuvanted  0.5 mL Intramuscular Tomorrow-1000   pantoprazole  40 mg Oral Daily   sodium bicarbonate  650 mg Oral TID   Continuous Infusions:  sodium chloride 50 mL/hr at 02/08/21 0646     LOS: 0 days   Time spent: 35 mins   Sonni Barse Wynetta Emery, MD How to contact the [facility] Attending or Consulting provider [place] or  covering provider during after hours [place], for this patient?  Check the care team in [facility] and look for a) attending/consulting TRH provider listed and b) the [facility] team listed Log into [email or web address] and use [place]'s universal password to access. If you do not have the password, please contact the hospital operator. Locate the [facility] provider you are looking for under [facility] and page to a number that you can be directly reached. If you still have difficulty reaching the provider, please page the [facility] (Director on Call) for the Hospitalists listed on amion for assistance.  02/08/2021, 11:27 AM

## 2021-02-08 NOTE — Plan of Care (Signed)
Care plan reviewed.

## 2021-02-08 NOTE — TOC Transition Note (Signed)
Transition of Care [facility]) - CM/SW Discharge Note   Patient Details  Name: Lucas [name] MRN: [id number] Date of Birth: 12-Oct-1950  [facility] CM/SW Contact:  Natasha Bence, LCSW Phone Number: 02/08/2021, 3:21 PM   Clinical Narrative:    CSW notified of patient's readiness for discharge. CSW contacted Melissa with [facility]. Melissa agreeable to take patient back. CSW faxed fl2, dc summary and completed med necessity. CSW also called EMS. Nurse to call report. TOC signing off.    Final next level of care: Long Term Nursing Home Barriers to Discharge: Barriers Resolved   Patient Goals and CMS Choice Patient states their goals for this hospitalization and ongoing recovery are:: Return to LTC CMS Medicare.gov Compare Post Acute Care list provided to:: Patient    Discharge Placement                [facility] Name of family member notified: Sullivan, Jacuinde (Sister)   [phone] Patient and family notified of of transfer: 02/08/21  Discharge Plan and Services                                     Social Determinants of Health (SDOH) Interventions     Readmission Risk Interventions No flowsheet data found.

## 2021-02-08 NOTE — Progress Notes (Signed)
Patient requires assistance with eating, this RN fed patient his breakfast this AM, good appetite noted, consumed 100% of his breakfast

## 2021-02-08 NOTE — Consult Note (Addendum)
Reason for Consult: AKI/CKD stage IV-V Referring Physician: Wynetta Emery, MD  Lucas [name] is an 70 y.o. male has a PMH significant for CVA, vascular dementia, carotid stenosis, chronic diastolic CHF, HTN, HLD, h/o GI bleed, schizoaffective disorder, remote cocaine/alcohol abuse, and CKD stage IV-V (followed by Dr. Theador Hawthorne) who was sent from his SNF (where he has lived for the past 6 years) due to abnormal lab tests.  He was seen on 02/07/21 and they reviewed labs from 01/31/21 which were notable for K 6.5 and Cr 5.2 and when they asked the SNF to repeat labs and were told they could not he was sent to [facility] ED for further evaluation and to start HD.  However labs from 02/07/21 in the ED were notable for K 5.5, BUN 58, Cr 4.38.  He was admitted for observation.  We were consulted to further evaluate his AKI/CKD stage IV-V.  He denies any N/V/D, anorexia, dysgeusia, lower extremity edema, shortness of breath, orthopnea, or PND.  The trend in Scr is seen below.  He has longstanding hyperkalemia due to noncompliance with low K diet despite daily Lokelma.  His Scr was 3.6 in September and rose to 5.2 but since admission has stabilized at 4.4 and K is normal at 4.7, BUN 57, CO2 26, and Hgb 9.6.  Trend in Creatinine:  Creatinine, Ser  Date/Time Value Ref Range Status  02/08/2021 08:04 AM 4.44 (H) 0.61 - 1.24 mg/dL Final  02/07/2021 07:49 PM 4.40 (H) 0.61 - 1.24 mg/dL Final  02/07/2021 12:57 PM 4.38 (H) 0.61 - 1.24 mg/dL Final  01/31/2021 5.2 (H)    01/24/2021 4.9 (H)    12/25/2020 3.6 (H)    10/24/2020 3.75 (H)    09/03/2019 05:57 AM 2.65 (H) 0.61 - 1.24 mg/dL Final  09/02/2019 05:10 AM 2.50 (H) 0.61 - 1.24 mg/dL Final  09/01/2019 03:52 AM 2.32 (H) 0.61 - 1.24 mg/dL Final  08/31/2019 06:07 AM 2.71 (H) 0.61 - 1.24 mg/dL Final  08/30/2019 05:48 AM 3.46 (H) 0.61 - 1.24 mg/dL Final  08/29/2019 03:13 PM 3.66 (H) 0.61 - 1.24 mg/dL Final  08/29/2019 08:06 AM 3.51 (H) 0.61 - 1.24 mg/dL Final  08/28/2019 07:35  PM 4.05 (H) 0.61 - 1.24 mg/dL Final  06/25/2017 02:41 AM 2.65 (H) 0.61 - 1.24 mg/dL Final  06/24/2017 10:17 AM 2.95 (H) 0.61 - 1.24 mg/dL Final  06/16/2017 09:51 AM 3.70 (H) 0.61 - 1.24 mg/dL Final  06/15/2017 10:09 AM 4.70 (H) 0.61 - 1.24 mg/dL Final  06/15/2017 09:55 AM 4.49 (H) 0.61 - 1.24 mg/dL Final  05/30/2017 06:27 AM 3.29 (H) 0.61 - 1.24 mg/dL Final  05/28/2017 05:31 AM 3.41 (H) 0.61 - 1.24 mg/dL Final  05/26/2017 11:52 PM 3.05 (H) 0.61 - 1.24 mg/dL Final  05/26/2017 05:54 AM 2.96 (H) 0.61 - 1.24 mg/dL Final  05/25/2017 02:51 AM 3.05 (H) 0.61 - 1.24 mg/dL Final  05/24/2017 12:44 PM 3.01 (H) 0.61 - 1.24 mg/dL Final  05/24/2017 10:16 AM 3.00 (H) 0.61 - 1.24 mg/dL Final  05/24/2017 09:54 AM 3.10 (H) 0.61 - 1.24 mg/dL Final  04/24/2015 09:42 AM 1.29 (H) 0.61 - 1.24 mg/dL Final  12/13/2010 06:00 AM 0.85 0.50 - 1.35 mg/dL Final  12/12/2010 06:45 AM 0.79 0.50 - 1.35 mg/dL Final  12/11/2010 06:40 AM 0.77 0.50 - 1.35 mg/dL Final  12/10/2010 05:54 AM 0.84 0.50 - 1.35 mg/dL Final  12/09/2010 02:55 PM 0.67 0.50 - 1.35 mg/dL Final  12/09/2010 11:25 AM 0.79 0.50 - 1.35 mg/dL Final  02/20/2010 08:46 PM  0.97 0.40 - 1.50 mg/dL Final    PMH:   Past Medical History:  Diagnosis Date   Alcohol dependence ([place])    Altered mental status    Anorexia    Carotid artery stenosis    Cataract    CHF (congestive heart failure) (HCC)    Chronic diastolic heart failure (HCC)    Chronic kidney disease, stage IV (severe) (HCC)    Cognitive deficits    CVA (cerebral infarction) 11/2010   CT 8/12 : Sm ischemic infarct posterior limb of L internal capsule. Carotids nl. ECHO nl with EF 55-60%. R sided weakness.    Dementia ([place])    Dementia ([place])    Dysphagia    Failure to thrive in adult    GI hemorrhage    Hyperlipemia    Hyperlipidemia 01/06/2011   Diagnosed during acute CVA hospitalization. On statin.   Hypertension 11/2010   Diagnosed during acute CVA hospitalization   Metabolic encephalopathy     Occlusion of anterior cerebral artery    Peripheral vascular disease (HCC)    Polysubstance abuse ([place]) 01/06/2011   cocaine, tobacco, THC   Psychotic disorder ([place])    PVD (peripheral vascular disease) ([place])    Right hemiplegia (HCC)    Schizoaffective disorder ([place])    Stroke (HCC)     PSH:   Past Surgical History:  Procedure Laterality Date   ESOPHAGOGASTRODUODENOSCOPY (EGD) WITH PROPOFOL N/A 06/25/2017   Dr. Oneida Alar: small hiatal hernia, gastritis/duodenititis. benign biopsy without H.pylori.     Allergies:  Allergies  Allergen Reactions   Pork-Derived Products Other (See Comments)    Patient does not eat pork products.    Medications:   Prior to Admission medications   Medication Sig Start Date End Date Taking? Authorizing Provider  acetaminophen (TYLENOL) 325 MG tablet Take 650 mg by mouth every 4 (four) hours as needed. 05/21/19   [provider]  AMINO ACIDS-PROTEIN HYDROLYS PO Take 30 mLs by mouth daily.    [provider]  amLODipine (NORVASC) 10 MG tablet Take 1 tablet (10 mg total) by mouth daily. Patient taking differently: Take 5 mg by mouth daily. 06/12/14   Funches, Adriana Mccallum, MD  atorvastatin (LIPITOR) 40 MG tablet Take 1 tablet (40 mg total) by mouth daily at 6 PM. 06/02/17   Patrecia Pour, Christean Grief, MD  CALMOSEPTINE 0.44-20.6 % OINT Apply 1 application topically daily.  05/18/19   [provider]  carvedilol (COREG) 6.25 MG tablet Take 1 tablet (6.25 mg total) by mouth 2 (two) times daily with a meal. 09/03/19   Tat, Shanon Brow, MD  cholestyramine light (PREVALITE) 4 g packet Take 1 packet by mouth daily. 07/27/19   [provider]  cloNIDine (CATAPRES - DOSED IN MG/24 HR) 0.2 mg/24hr patch Place 0.2 mg onto the skin once a week.  05/26/18   [provider]  cloNIDine (CATAPRES) 0.2 MG tablet Take 1 tablet (0.2 mg total) by mouth 2 (two) times daily. Patient taking differently: Take 0.1 mg by mouth every 6 (six) hours as needed (blood  pressure). 06/02/17   Doreatha Lew, MD  clopidogrel (PLAVIX) 75 MG tablet Take 1 tablet (75 mg total) by mouth daily. 06/03/17   Doreatha Lew, MD  divalproex (DEPAKOTE SPRINKLE) 125 MG capsule Take 500 mg by mouth daily.     [provider]  divalproex (DEPAKOTE) 250 MG DR tablet Take 250 mg by mouth daily.    [provider]  hydrALAZINE (APRESOLINE) 100 MG tablet Take 100  mg by mouth 3 (three) times daily.    [provider]  mirtazapine (REMERON) 7.5 MG tablet Take 7.5 mg by mouth at bedtime. 07/27/19   [provider]  Multiple Vitamins-Minerals (MULTIPLE VIT/MINERALS/NO IRON) TABS Take 1 tablet by mouth daily. 05/18/19   [provider]  NORCO 5-325 MG tablet Take 0.5 tablets by mouth 3 (three) times daily. 09/03/19   Orson Eva, MD  omeprazole (PRILOSEC) 40 MG capsule Take 40 mg by mouth 2 (two) times daily. 07/27/19   [provider]  Skin Protectants, Misc. (MINERIN CREME) CREA Apply 1 application topically in the morning and at bedtime.  05/21/19   [provider]    Inpatient medications:  atorvastatin  40 mg Oral q1800   Chlorhexidine Gluconate Cloth  6 each Topical Daily   clopidogrel  75 mg Oral Daily   feeding supplement (NEPRO CARB STEADY)  237 mL Oral BID BM   heparin  5,000 Units Subcutaneous Q8H   [START ON 02/09/2021] influenza vaccine adjuvanted  0.5 mL Intramuscular Tomorrow-1000   pantoprazole  40 mg Oral Daily   sodium bicarbonate  650 mg Oral TID    Discontinued Meds:  There are no discontinued medications.  Social History:  reports that he has been smoking cigarettes. He started smoking about 56 years ago. He has a 20.00 pack-year smoking history. He has never used smokeless tobacco. He reports current alcohol use of about 2.0 standard drinks per week. He reports that he does not currently use drugs after having used the following drugs: Cocaine.  Family History:   Family History  Problem  Relation Age of Onset   Hypertension Mother    Hypertension Father     A comprehensive review of systems was negative. Weight change:   Intake/Output Summary (Last 24 hours) at 02/08/2021 1337 Last data filed at 02/08/2021 0914 Gross per 24 hour  Intake 880.62 ml  Output --  Net 880.62 ml   BP (!) 128/48   Pulse 71   Temp 98.3 F (36.8 C) (Oral)   Resp 14   Ht 5\' 5"  (1.651 m)   Wt 49.3 kg   SpO2 99%   BMI 18.09 kg/m  Vitals:   02/08/21 1030 02/08/21 1130 02/08/21 1133 02/08/21 1200  BP: (!) 150/59 (!) 145/43  (!) 128/48  Pulse: 66 72  71  Resp: 17 15  14   Temp:   98.3 F (36.8 C)   TempSrc:   Oral   SpO2: 99% 99%  99%  Weight:      Height:         General appearance: no distress and slowed mentation Head: Normocephalic, without obvious abnormality, atraumatic Resp: clear to auscultation bilaterally Cardio: regular rate and rhythm, S1, S2 normal, no murmur, click, rub or gallop GI: soft, non-tender; bowel sounds normal; no masses,  no organomegaly Extremities: extremities normal, atraumatic, no cyanosis or edema Neuro: no asterixis  Labs: Basic Metabolic Panel: Recent Labs  Lab 02/07/21 1257 02/07/21 1949 02/08/21 0804  NA 141 144 144  K 5.5* 4.7 4.9  CL 110 109 112*  CO2 21* 26 25  GLUCOSE 97 70 85  BUN 58* 57* 56*  CREATININE 4.38* 4.40* 4.44*  ALBUMIN 3.7  --  3.6  CALCIUM 7.9* 8.9 8.4*  PHOS 3.5  --  3.9   Liver Function Tests: Recent Labs  Lab 02/07/21 1257 02/08/21 0804  AST 13*  --   ALT 8  --   ALKPHOS 50  --  BILITOT 0.0*  --   PROT 7.1  --   ALBUMIN 3.7 3.6   No results for input(s): LIPASE, AMYLASE in the last 168 hours. No results for input(s): AMMONIA in the last 168 hours. CBC: Recent Labs  Lab 02/07/21 1257 02/08/21 0804  WBC 5.7 6.3  HGB 8.4* 9.6*  HCT 28.0* 31.6*  MCV 91.8 90.8  PLT 153 179   PT/INR: @LABRCNTIP (inr:5) Cardiac Enzymes: )No results for input(s): CKTOTAL, CKMB, CKMBINDEX, TROPONINI in the last  168 hours. CBG: Recent Labs  Lab 02/07/21 2117  GLUCAP 78    Iron Studies:  Recent Labs  Lab 02/07/21 1621  IRON 48  TIBC 200*  FERRITIN 616*    Xrays/Other Studies: No results found.   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  He is currently not uremic and no indication for urgent HD.  We will not have the ability to place a [facility] this week as Dr. Constance Haw will be gone.  I recommend discharge back to [place] and arrange for outpatient placement of a [facility] and initiation of HD if Dr. Theador Hawthorne still feels that this is indicated. HTN - continue with home meds Hyperkalemia - due to poor compliance with low K diet at SNF.  Continue with daily lokelma 10 gms tid. Anemia of CKD stage V- stable for now and resume ESA per his primary nephrologist. Metabolic acidosis - continue with sodium bicarb. Disposition - ok for discharge to SNF with the above plans to be arranged by his outpatient nephrologist as an outpatient and outpatient dialysis arrangements.   Governor Rooks Bueford Arp 02/08/2021, 1:37 PM   I have paged Dr. Candiss Norse of [place] who is on call this weekend for Dr. Theador Hawthorne to give an update.

## 2021-02-08 NOTE — Discharge Instructions (Addendum)
PLEASE CHECK RENAL FUNCTION PANEL ON 02/11/21 AND 02/14/21 AND REPORT RESULTS TO DR. Theador Hawthorne (NEPHROLOGY)    IMPORTANT INFORMATION: PAY CLOSE ATTENTION   PHYSICIAN DISCHARGE INSTRUCTIONS  Follow with Primary care provider  [name], MD  and other consultants as instructed by your Hospitalist Physician  [place] IF SYMPTOMS COME BACK, WORSEN OR NEW PROBLEM DEVELOPS   Please note: You were cared for by a hospitalist during your hospital stay. Every effort will be made to forward records to your primary care provider.  You can request that your primary care provider send for your hospital records if they have not received them.  Once you are discharged, your primary care physician will handle any further medical issues. Please note that NO REFILLS for any discharge medications will be authorized once you are discharged, as it is imperative that you return to your primary care physician (or establish a relationship with a primary care physician if you do not have one) for your post hospital discharge needs so that they can reassess your need for medications and monitor your lab values.  Please get a complete blood count and chemistry panel checked by your Primary MD at your next visit, and again as instructed by your Primary MD.  Get Medicines reviewed and adjusted: Please take all your medications with you for your next visit with your Primary MD  Laboratory/radiological data: Please request your Primary MD to go over all hospital tests and procedure/radiological results at the follow up, please ask your primary care provider to get all Hospital records sent to his/her office.  In some cases, they will be blood work, cultures and biopsy results pending at the time of your discharge. Please request that your primary care provider follow up on these results.  If you are diabetic, please bring your blood sugar readings with you to your follow up  appointment with primary care.    Please call and make your follow up appointments as soon as possible.    Also Note the following: If you experience worsening of your admission symptoms, develop shortness of breath, life threatening emergency, suicidal or homicidal thoughts you must seek medical attention immediately by calling 911 or calling your MD immediately  if symptoms less severe.  You must read complete instructions/literature along with all the possible adverse reactions/side effects for all the Medicines you take and that have been prescribed to you. Take any new Medicines after you have completely understood and accpet all the possible adverse reactions/side effects.   Do not drive when taking Pain medications or sleeping medications (Benzodiazepines)  Do not take more than prescribed Pain, Sleep and Anxiety Medications. It is not advisable to combine anxiety,sleep and pain medications without talking with your primary care practitioner  Special Instructions: If you have smoked or chewed Tobacco  in the last 2 yrs please stop smoking, stop any regular Alcohol  and or any Recreational drug use.  Wear Seat belts while driving.  Do not drive if taking any narcotic, mind altering or controlled substances or recreational drugs or alcohol.

## 2021-02-08 NOTE — Progress Notes (Signed)
Patient is refusing lab draw this AM.

## 2021-02-08 NOTE — Discharge Summary (Addendum)
Physician Discharge Summary  Lucas [name] MBT:[id number] DOB: [birth year] DOA: 02/07/2021  PCP: [name], MD NEPHROLOGIST: DR. Marlise Eves Admit date: 02/07/2021 Discharge date: 02/08/2021  Admitted From: [facility]  Disposition: [facility]   Recommendations for Outpatient Follow-up:  Follow up with NEPHROLOGIST IN 1 WEEK Please obtain renal function panel on 11/1, 11/4 and report results to nephrologist   Discharge Condition: STABLE   DIET: renal   Brief Hospitalization Summary: Please see all hospital notes, images, labs for full details of the hospitalization. ADMISSION HPI:   Lucas [name]  is a 70 y.o. 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, but none over last 6 years as he is living at skilled nursing facility, is living at [facility] patient was sent by his physician due to worsening renal function , patient himself denies any complaints, but he is with dementia at baseline and not a great historian, but he denies nausea, vomiting, urinary retention, shortness of breath, patient being followed by nephrology as an outpatient with underlining CKD stage V, by reviewing labs and reports from his outpatient nephrologist, does appear patient with recent potassium 6.5, and creatinine of 5.2 couple weeks ago, as well as some labs from SNF with potassium of 5.9, and creatinine of 4.8, currently patient had repeat labs done at nephrologist office, which prompted him to send him to ED for further evaluation. - in ED patient work-up was significant for potassium of 5.5, creatinine of 4.38, phosphorus level is normal at 3.5, BUN at 58, hemoglobin around baseline of 8.4, EKG showing peaked T waves, patient received Kayexalate, calcium gluconate, bicarb and via insulin with D50, [facility] consulted  to admit.   HOSPITAL COURSE:  Patient was admitted for to the hospital under observation as there was some concern that he might be nearing the need to start hemodialysis.  Fortunately his labs have stabilized and he is not volume overloaded he has no uremic symptoms and there was no need to urgently start hemodialysis at this time.  He was seen by our nephrologist Dr. Marval Regal who evaluated the patient and felt that he did not need urgent immediate dialysis at this time.  His labs have been monitored and have been stable. His hyperkalemia has resolved.  We had thought about placing a TDC in the hospital however the general surgeon that does the [facility] placements will not be available this week and because is not an urgent need nephrology felt that he can have these things arranged outpatient because he has a primary nephrologist Dr. Theador Hawthorne with [facility].  Patient should follow-up with Dr. Theador Hawthorne next week.  The patient should have a repeat renal function panel tested on 11/1 AND 11/4 and the results should be sent to the nephrologist for review.  Patient is stable to discharge back to skilled nursing facility.  No changes have been made to the regular home medications.  Discharge Diagnoses:  Active Problems:   Hyperlipidemia with target LDL less than 100   Cerebrovascular accident (CVA) ([place])   Acute on chronic renal failure (HCC)   Hyperkalemia  Discharge Instructions:  Allergies as of 02/08/2021       Reactions   Pork-derived Products Other (See Comments)   Patient does not eat pork products.        Medication List     STOP taking  these medications    acetaminophen 325 MG tablet Commonly known as: TYLENOL   AMINO ACIDS-PROTEIN HYDROLYS PO   Calmoseptine 0.44-20.6 % Oint Generic drug: Menthol-Zinc Oxide   cholestyramine light 4 g packet Commonly known as: PREVALITE   cloNIDine 0.2 MG tablet Commonly known as: CATAPRES   cloNIDine 0.2 mg/24hr  patch Commonly known as: CATAPRES - Dosed in mg/24 hr   divalproex 125 MG capsule Commonly known as: DEPAKOTE SPRINKLE Replaced by: divalproex 500 MG DR tablet You also have another medication with the same name that you need to continue taking as instructed.   mirtazapine 7.5 MG tablet Commonly known as: REMERON   Multiple Vit/Minerals/No Iron Tabs   Norco 5-325 MG tablet Generic drug: HYDROcodone-acetaminophen   omeprazole 40 MG capsule Commonly known as: PRILOSEC       TAKE these medications    amLODipine 10 MG tablet Commonly known as: NORVASC Take 1 tablet (10 mg total) by mouth daily. What changed: how much to take   ascorbic acid 500 MG tablet Commonly known as: VITAMIN C Take 1 tablet (500 mg total) by mouth daily.   atorvastatin 40 MG tablet Commonly known as: LIPITOR Take 1 tablet (40 mg total) by mouth daily at 6 PM.   calcium carbonate 500 MG chewable tablet Commonly known as: Tums Chew 2 tablets (400 mg of elemental calcium total) by mouth 3 (three) times daily.   carvedilol 6.25 MG tablet Commonly known as: COREG Take 1 tablet (6.25 mg total) by mouth 2 (two) times daily with a meal.   Cholecalciferol 25 MCG (1000 UT) Chew Chew 1 tablet (1,000 Units total) by mouth daily.   clopidogrel 75 MG tablet Commonly known as: PLAVIX Take 1 tablet (75 mg total) by mouth daily.   divalproex 250 MG DR tablet Commonly known as: DEPAKOTE Take 250 mg by mouth daily. What changed:  Another medication with the same name was added. Make sure you understand how and when to take each. Another medication with the same name was removed. Continue taking this medication, and follow the directions you see here.   divalproex 500 MG DR tablet Commonly known as: DEPAKOTE Take 1 tablet (500 mg total) by mouth at bedtime. For behaviors. What changed: You were already taking a medication with the same name, and this prescription was added. Make sure you understand how and  when to take each. Replaces: divalproex 125 MG capsule   divalproex 125 MG DR tablet Commonly known as: DEPAKOTE Take 1 tablet (125 mg total) by mouth daily. Take with 250 mg = 375 mg What changed: You were already taking a medication with the same name, and this prescription was added. Make sure you understand how and when to take each.   Epogen 2000 UNIT/ML injection Generic drug: epoetin alfa Inject 1 mL (2,000 Units total) into the skin every 14 (fourteen) days. Give along with 4000 units = 6000 units   Epogen 4000 UNIT/ML injection Generic drug: epoetin alfa Inject 1 mL (4,000 Units total) into the vein every 14 (fourteen) days.   famotidine 20 MG tablet Commonly known as: PEPCID Take 1 tablet (20 mg total) by mouth 2 (two) times daily.   ferrous sulfate 325 (65 FE) MG EC tablet Take 1 tablet (325 mg total) by mouth daily with breakfast.   hydrALAZINE 100 MG tablet Commonly known as: APRESOLINE Take 100 mg by mouth 3 (three) times daily.   Minerin Creme Crea Apply 1 application topically in the morning and at bedtime.  sertraline 100 MG tablet Commonly known as: Zoloft Take 1.5 tablets (150 mg total) by mouth daily.   sodium bicarbonate 325 MG tablet Take 2 tablets (650 mg total) by mouth 2 (two) times daily.   Veltassa 16.8 g Pack Generic drug: Patiromer Sorbitex Calcium Take 1 packet by mouth daily.        Follow-up Information     [name], MD. Schedule an appointment as soon as possible for a visit.   Specialty: Internal Medicine Why: Hospital Follow Up Contact information: [address] [place] [address] [phone]         Liana Gerold, MD. Schedule an appointment as soon as possible for a visit in 1 week(s).   Specialty: Nephrology Why: Hospital Follow Up Contact information: [address] [address] [phone]                Allergies  Allergen Reactions   Pork-Derived Products  Other (See Comments)    Patient does not eat pork products.   Allergies as of 02/08/2021       Reactions   Pork-derived Products Other (See Comments)   Patient does not eat pork products.        Medication List     STOP taking these medications    acetaminophen 325 MG tablet Commonly known as: TYLENOL   AMINO ACIDS-PROTEIN HYDROLYS PO   Calmoseptine 0.44-20.6 % Oint Generic drug: Menthol-Zinc Oxide   cholestyramine light 4 g packet Commonly known as: PREVALITE   cloNIDine 0.2 MG tablet Commonly known as: CATAPRES   cloNIDine 0.2 mg/24hr patch Commonly known as: CATAPRES - Dosed in mg/24 hr   divalproex 125 MG capsule Commonly known as: DEPAKOTE SPRINKLE Replaced by: divalproex 500 MG DR tablet You also have another medication with the same name that you need to continue taking as instructed.   mirtazapine 7.5 MG tablet Commonly known as: REMERON   Multiple Vit/Minerals/No Iron Tabs   Norco 5-325 MG tablet Generic drug: HYDROcodone-acetaminophen   omeprazole 40 MG capsule Commonly known as: PRILOSEC       TAKE these medications    amLODipine 10 MG tablet Commonly known as: NORVASC Take 1 tablet (10 mg total) by mouth daily. What changed: how much to take   ascorbic acid 500 MG tablet Commonly known as: VITAMIN C Take 1 tablet (500 mg total) by mouth daily.   atorvastatin 40 MG tablet Commonly known as: LIPITOR Take 1 tablet (40 mg total) by mouth daily at 6 PM.   calcium carbonate 500 MG chewable tablet Commonly known as: Tums Chew 2 tablets (400 mg of elemental calcium total) by mouth 3 (three) times daily.   carvedilol 6.25 MG tablet Commonly known as: COREG Take 1 tablet (6.25 mg total) by mouth 2 (two) times daily with a meal.   Cholecalciferol 25 MCG (1000 UT) Chew Chew 1 tablet (1,000 Units total) by mouth daily.   clopidogrel 75 MG tablet Commonly known as: PLAVIX Take 1 tablet (75 mg total) by mouth daily.   divalproex 250 MG  DR tablet Commonly known as: DEPAKOTE Take 250 mg by mouth daily. What changed:  Another medication with the same name was added. Make sure you understand how and when to take each. Another medication with the same name was removed. Continue taking this medication, and follow the directions you see here.   divalproex 500 MG DR tablet Commonly known as: DEPAKOTE Take 1 tablet (500 mg total) by mouth at  bedtime. For behaviors. What changed: You were already taking a medication with the same name, and this prescription was added. Make sure you understand how and when to take each. Replaces: divalproex 125 MG capsule   divalproex 125 MG DR tablet Commonly known as: DEPAKOTE Take 1 tablet (125 mg total) by mouth daily. Take with 250 mg = 375 mg What changed: You were already taking a medication with the same name, and this prescription was added. Make sure you understand how and when to take each.   Epogen 2000 UNIT/ML injection Generic drug: epoetin alfa Inject 1 mL (2,000 Units total) into the skin every 14 (fourteen) days. Give along with 4000 units = 6000 units   Epogen 4000 UNIT/ML injection Generic drug: epoetin alfa Inject 1 mL (4,000 Units total) into the vein every 14 (fourteen) days.   famotidine 20 MG tablet Commonly known as: PEPCID Take 1 tablet (20 mg total) by mouth 2 (two) times daily.   ferrous sulfate 325 (65 FE) MG EC tablet Take 1 tablet (325 mg total) by mouth daily with breakfast.   hydrALAZINE 100 MG tablet Commonly known as: APRESOLINE Take 100 mg by mouth 3 (three) times daily.   Minerin Creme Crea Apply 1 application topically in the morning and at bedtime.   sertraline 100 MG tablet Commonly known as: Zoloft Take 1.5 tablets (150 mg total) by mouth daily.   sodium bicarbonate 325 MG tablet Take 2 tablets (650 mg total) by mouth 2 (two) times daily.   Veltassa 16.8 g Pack Generic drug: Patiromer Sorbitex Calcium Take 1 packet by mouth daily.         Procedures/Studies: No results found.   Discharge Exam: Vitals:   02/08/21 1133 02/08/21 1200  BP:  (!) 128/48  Pulse:  71  Resp:  14  Temp: 98.3 F (36.8 C)   SpO2:  99%   Vitals:   02/08/21 1030 02/08/21 1130 02/08/21 1133 02/08/21 1200  BP: (!) 150/59 (!) 145/43  (!) 128/48  Pulse: 66 72  71  Resp: 17 15  14   Temp:   98.3 F (36.8 C)   TempSrc:   Oral   SpO2: 99% 99%  99%  Weight:      Height:       General exam: with dementia, pleasantly confused, appears chronically ill, Appears calm and comfortable  Respiratory system: Clear to auscultation. Respiratory effort normal. Cardiovascular system: normal S1 & S2 heard. No JVD, murmurs, rubs, gallops or clicks. No pedal edema. Gastrointestinal system: Abdomen is nondistended, soft and nontender. No organomegaly or masses felt. Normal bowel sounds heard. Central nervous system: Alert and oriented. No focal neurological deficits. Extremities: Symmetric 5 x 5 power. Skin: No rashes, lesions or ulcers Psychiatry: Judgement and insight appear poor. Mood & affect appropriate.    The results of significant diagnostics from this hospitalization (including imaging, microbiology, ancillary and laboratory) are listed below for reference.     Microbiology: Recent Results (from the past 240 hour(s))  Resp Panel by RT-PCR (Flu A&B, Covid) Nasopharyngeal Swab     Status: None   Collection Time: 02/07/21  4:23 PM   Specimen: Nasopharyngeal Swab; Nasopharyngeal(NP) swabs in vial transport medium  Result Value Ref Range Status   SARS Coronavirus 2 by RT PCR NEGATIVE NEGATIVE Final    Comment: (NOTE) SARS-CoV-2 target nucleic acids are NOT DETECTED.  The SARS-CoV-2 RNA is generally detectable in upper respiratory specimens during the acute phase of infection. The lowest concentration of SARS-CoV-2 viral  copies this assay can detect is 138 copies/mL. A negative result does not preclude SARS-Cov-2 infection and should not be  used as the sole basis for treatment or other patient management decisions. A negative result may occur with  improper specimen collection/handling, submission of specimen other than nasopharyngeal swab, presence of viral mutation(s) within the areas targeted by this assay, and inadequate number of viral copies(<138 copies/mL). A negative result must be combined with clinical observations, patient history, and epidemiological information. The expected result is Negative.  Fact Sheet for Patients:  EntrepreneurPulse.com.au  Fact Sheet for Healthcare Providers:  IncredibleEmployment.be  This test is no t yet approved or cleared by the [place] FDA and  has been authorized for detection and/or diagnosis of SARS-CoV-2 by FDA under an Emergency Use Authorization (EUA). This EUA will remain  in effect (meaning this test can be used) for the duration of the COVID-19 declaration under Section 564(b)(1) of the Act, 21 U.S.C.section 360bbb-3(b)(1), unless the authorization is terminated  or revoked sooner.       Influenza A by PCR NEGATIVE NEGATIVE Final   Influenza B by PCR NEGATIVE NEGATIVE Final    Comment: (NOTE) The Xpert Xpress SARS-CoV-2/FLU/RSV plus assay is intended as an aid in the diagnosis of influenza from Nasopharyngeal swab specimens and should not be used as a sole basis for treatment. Nasal washings and aspirates are unacceptable for Xpert Xpress SARS-CoV-2/FLU/RSV testing.  Fact Sheet for Patients: EntrepreneurPulse.com.au  Fact Sheet for Healthcare Providers: IncredibleEmployment.be  This test is not yet approved or cleared by the [place] FDA and has been authorized for detection and/or diagnosis of SARS-CoV-2 by FDA under an Emergency Use Authorization (EUA). This EUA will remain in effect (meaning this test can be used) for the duration of the COVID-19 declaration under Section  564(b)(1) of the Act, 21 U.S.C. section 360bbb-3(b)(1), unless the authorization is terminated or revoked.  Performed at [facility], [address], [place], [place] [address]      Labs: BNP (last 3 results) No results for input(s): BNP in the last 8760 hours. Basic Metabolic Panel: Recent Labs  Lab 02/07/21 1257 02/07/21 1949 02/08/21 0804  NA 141 144 144  K 5.5* 4.7 4.9  CL 110 109 112*  CO2 21* 26 25  GLUCOSE 97 70 85  BUN 58* 57* 56*  CREATININE 4.38* 4.40* 4.44*  CALCIUM 7.9* 8.9 8.4*  MG 2.9*  --   --   PHOS 3.5  --  3.9   Liver Function Tests: Recent Labs  Lab 02/07/21 1257 02/08/21 0804  AST 13*  --   ALT 8  --   ALKPHOS 50  --   BILITOT 0.0*  --   PROT 7.1  --   ALBUMIN 3.7 3.6   No results for input(s): LIPASE, AMYLASE in the last 168 hours. No results for input(s): AMMONIA in the last 168 hours. CBC: Recent Labs  Lab 02/07/21 1257 02/08/21 0804  WBC 5.7 6.3  HGB 8.4* 9.6*  HCT 28.0* 31.6*  MCV 91.8 90.8  PLT 153 179   Cardiac Enzymes: No results for input(s): CKTOTAL, CKMB, CKMBINDEX, TROPONINI in the last 168 hours. BNP: Invalid input(s): POCBNP CBG: Recent Labs  Lab 02/07/21 2117  GLUCAP 78   D-Dimer No results for input(s): DDIMER in the last 72 hours. Hgb A1c No results for input(s): HGBA1C in the last 72 hours. Lipid Profile No results for input(s): CHOL, HDL, LDLCALC, TRIG, CHOLHDL, LDLDIRECT in the last 72 hours. Thyroid function  studies No results for input(s): TSH, T4TOTAL, T3FREE, THYROIDAB in the last 72 hours.  Invalid input(s): FREET3 Anemia work up Recent Labs    02/07/21 1621  FERRITIN 616*  TIBC 200*  IRON 48   Urinalysis    Component Value Date/Time   COLORURINE YELLOW 09/01/2019 1900   APPEARANCEUR CLEAR 09/01/2019 1900   LABSPEC 1.012 09/01/2019 1900   PHURINE 5.0 09/01/2019 1900   GLUCOSEU NEGATIVE 09/01/2019 1900   HGBUR SMALL (A) 09/01/2019 1900   BILIRUBINUR NEGATIVE 09/01/2019 1900    KETONESUR NEGATIVE 09/01/2019 1900   PROTEINUR >=300 (A) 09/01/2019 1900   UROBILINOGEN 0.2 12/09/2010 1117   NITRITE NEGATIVE 09/01/2019 1900   LEUKOCYTESUR NEGATIVE 09/01/2019 1900   Sepsis Labs Invalid input(s): PROCALCITONIN,  WBC,  LACTICIDVEN Microbiology Recent Results (from the past 240 hour(s))  Resp Panel by RT-PCR (Flu A&B, Covid) Nasopharyngeal Swab     Status: None   Collection Time: 02/07/21  4:23 PM   Specimen: Nasopharyngeal Swab; Nasopharyngeal(NP) swabs in vial transport medium  Result Value Ref Range Status   SARS Coronavirus 2 by RT PCR NEGATIVE NEGATIVE Final    Comment: (NOTE) SARS-CoV-2 target nucleic acids are NOT DETECTED.  The SARS-CoV-2 RNA is generally detectable in upper respiratory specimens during the acute phase of infection. The lowest concentration of SARS-CoV-2 viral copies this assay can detect is 138 copies/mL. A negative result does not preclude SARS-Cov-2 infection and should not be used as the sole basis for treatment or other patient management decisions. A negative result may occur with  improper specimen collection/handling, submission of specimen other than nasopharyngeal swab, presence of viral mutation(s) within the areas targeted by this assay, and inadequate number of viral copies(<138 copies/mL). A negative result must be combined with clinical observations, patient history, and epidemiological information. The expected result is Negative.  Fact Sheet for Patients:  EntrepreneurPulse.com.au  Fact Sheet for Healthcare Providers:  IncredibleEmployment.be  This test is no t yet approved or cleared by the [place] FDA and  has been authorized for detection and/or diagnosis of SARS-CoV-2 by FDA under an Emergency Use Authorization (EUA). This EUA will remain  in effect (meaning this test can be used) for the duration of the COVID-19 declaration under Section 564(b)(1) of the Act,  21 U.S.C.section 360bbb-3(b)(1), unless the authorization is terminated  or revoked sooner.       Influenza A by PCR NEGATIVE NEGATIVE Final   Influenza B by PCR NEGATIVE NEGATIVE Final    Comment: (NOTE) The Xpert Xpress SARS-CoV-2/FLU/RSV plus assay is intended as an aid in the diagnosis of influenza from Nasopharyngeal swab specimens and should not be used as a sole basis for treatment. Nasal washings and aspirates are unacceptable for Xpert Xpress SARS-CoV-2/FLU/RSV testing.  Fact Sheet for Patients: EntrepreneurPulse.com.au  Fact Sheet for Healthcare Providers: IncredibleEmployment.be  This test is not yet approved or cleared by the [place] FDA and has been authorized for detection and/or diagnosis of SARS-CoV-2 by FDA under an Emergency Use Authorization (EUA). This EUA will remain in effect (meaning this test can be used) for the duration of the COVID-19 declaration under Section 564(b)(1) of the Act, 21 U.S.C. section 360bbb-3(b)(1), unless the authorization is terminated or revoked.  Performed at [facility], [address] [place], [place] [address]    Time coordinating discharge:   SIGNED:  Irwin Brakeman, MD  [facility] 02/08/2021, 2:33 PM How to contact the [facility] Attending or Consulting provider [place] or covering provider during after hours  7P -7A, for this patient?  Check the care team in [facility] and look for a) attending/consulting [facility] provider listed and b) the [facility] team listed Log into [email or web address] and use [place]'s universal password to access. If you do not have the password, please contact the hospital operator. Locate the [facility] provider you are looking for under [facility] and page to a number that you can be directly reached. If you still have difficulty reaching the provider, please page the [facility] (Director on Call) for the Hospitalists listed on amion for assistance.

## 2021-02-08 NOTE — NC FL2 (Signed)
[place] LEVEL OF CARE SCREENING TOOL     IDENTIFICATION  Patient Name: Lucas [name] Birthdate: April 13, 1951 Sex: male Admission Date (Current Location): 02/07/2021  [place] and [place] Number:  [facility] [id number] [place] and Address:  [facility] [address], [place]      Provider Number: [id number]  Attending Physician Name and Address:  Murlean Iba, MD  Relative Name and Phone Number:  Jaevin, Medearis (Sister)   [phone]    Current Level of Care: Hospital Recommended Level of Care: Other (Comment) (LTC) Prior Approval Number:    Date Approved/Denied:   PASRR Number:    Discharge Plan: Other (Comment) (LTC)    Current Diagnoses: Patient Active Problem List   Diagnosis Date Noted   Acute renal failure superimposed on stage 4 chronic kidney disease ([place]) 30/86/5784   Acute metabolic encephalopathy 69/62/9528   Hyperkalemia    Hypertensive urgency 08/29/2019   Acute on chronic renal failure ([place]) 08/29/2019   Hypoglycemia 08/28/2019   Normocytic anemia 11/03/2017   UGI bleed 06/24/2017   E coli bacteremia 06/17/2017   Altered mental status 06/15/2017   Pressure injury of skin 06/15/2017   AKI (acute kidney injury) ([place]) 06/15/2017   CKD (chronic kidney disease) stage 4, GFR 15-29 ml/min ([place]) 06/15/2017   History of stroke 06/15/2017   Acute lower UTI 06/15/2017   Dehydration 06/15/2017   Cocaine abuse ([place])    Noncompliance w/medication treatment due to intermit use of medication    Middle cerebral artery stenosis, right    Stenosis of right carotid artery    Cerebrovascular accident (CVA) ([place])    Malignant hypertension 05/24/2017   Smoker 06/12/2014   Memory loss or impairment 02/13/2013   Right shoulder pain 02/21/2011   HTN (hypertension) 01/06/2011   Hyperlipidemia with target LDL less than 100 01/06/2011   Polysubstance abuse ([place]) 01/06/2011   CVA (cerebral infarction) 01/06/2011    Preventative health care 01/06/2011    [place] RESPIRATION BLADDER Height & Weight     Self  Normal Incontinent Weight: 108 lb 11 oz (49.3 kg) Height:  5\' 5"  (165.1 cm)  BEHAVIORAL SYMPTOMS/MOOD NEUROLOGICAL BOWEL NUTRITION STATUS      Incontinent Diet (Diet renal with fluid restriction Fluid restriction: 1200 mL Fluid; Room service appropriate? Yes; Fluid consistency: Thin)  AMBULATORY STATUS COMMUNICATION OF NEEDS Skin     Verbally Normal                       Personal Care Assistance Level of Assistance  Bathing, Feeding, Dressing Bathing Assistance: Maximum assistance Feeding assistance: Maximum assistance Dressing Assistance: Maximum assistance     Functional Limitations Info  Sight, Hearing, Speech Sight Info: Adequate Hearing Info: Adequate Speech Info: Impaired    SPECIAL CARE FACTORS FREQUENCY                       Contractures Contractures Info: Not present    Additional Factors Info  Code Status, Allergies Code Status Info: Full Allergies Info: Pork-derived Products           Current Medications (02/08/2021):  This is the current hospital active medication list Current Facility-Administered Medications  Medication Dose Route Frequency Provider Last Rate Last Admin   0.9 %  sodium chloride infusion   Intravenous Continuous Elgergawy, Silver Huguenin, MD 50 mL/hr at 02/08/21 0646 Infusion Verify at 02/08/21 0646   atorvastatin (LIPITOR) tablet 40 mg  40 mg Oral q1800 Elgergawy,  Silver Huguenin, MD       Chlorhexidine Gluconate Cloth 2 % PADS 6 each  6 each Topical Daily Elgergawy, Silver Huguenin, MD   6 each at 02/08/21 1008   clopidogrel (PLAVIX) tablet 75 mg  75 mg Oral Daily Elgergawy, Silver Huguenin, MD   75 mg at 02/08/21 1008   feeding supplement (NEPRO CARB STEADY) liquid 237 mL  237 mL Oral BID BM Elgergawy, Silver Huguenin, MD   237 mL at 02/08/21 1402   heparin injection 5,000 Units  5,000 Units Subcutaneous Q8H Elgergawy, Silver Huguenin, MD       hydrALAZINE  (APRESOLINE) injection 10 mg  10 mg Intravenous Q4H PRN Elgergawy, Silver Huguenin, MD   10 mg at 02/08/21 1008   [START ON 02/09/2021] influenza vaccine adjuvanted (FLUAD) injection 0.5 mL  0.5 mL Intramuscular Tomorrow-1000 Johnson, Clanford L, MD       pantoprazole (PROTONIX) EC tablet 40 mg  40 mg Oral Daily Elgergawy, Silver Huguenin, MD   40 mg at 02/08/21 1008   sodium bicarbonate tablet 650 mg  650 mg Oral TID Donato Heinz, MD   650 mg at 02/08/21 1008     Discharge Medications: Please see discharge summary for a list of discharge medications.  Relevant Imaging Results:  Relevant Lab Results:   Additional Information PT SSN [id number]  Natasha Bence, LCSW

## 2021-07-01 ENCOUNTER — Other Ambulatory Visit (HOSPITAL_COMMUNITY): Payer: Self-pay | Admitting: Nephrology

## 2021-07-01 DIAGNOSIS — N184 Chronic kidney disease, stage 4 (severe): Secondary | ICD-10-CM

## 2021-07-01 DIAGNOSIS — R809 Proteinuria, unspecified: Secondary | ICD-10-CM

## 2021-07-01 DIAGNOSIS — N170 Acute kidney failure with tubular necrosis: Secondary | ICD-10-CM

## 2021-07-18 ENCOUNTER — Ambulatory Visit (HOSPITAL_COMMUNITY): Payer: Medicare Other | Attending: Nephrology

## 2021-07-18 ENCOUNTER — Encounter (HOSPITAL_COMMUNITY): Payer: Self-pay

## 2021-12-25 ENCOUNTER — Inpatient Hospital Stay (HOSPITAL_COMMUNITY)
Admission: EM | Admit: 2021-12-25 | Discharge: 2021-12-28 | DRG: 682 | Disposition: A | Discharge status: Skilled Nursing Facility | Payer: Medicare Other | Source: Skilled Nursing Facility | Attending: Internal Medicine | Admitting: Internal Medicine

## 2021-12-25 ENCOUNTER — Encounter (HOSPITAL_COMMUNITY): Payer: Self-pay | Admitting: Emergency Medicine

## 2021-12-25 ENCOUNTER — Other Ambulatory Visit: Payer: Self-pay

## 2021-12-25 ENCOUNTER — Emergency Department (HOSPITAL_COMMUNITY): Payer: Medicare Other

## 2021-12-25 DIAGNOSIS — F0390 Unspecified dementia without behavioral disturbance: Secondary | ICD-10-CM | POA: Diagnosis present

## 2021-12-25 DIAGNOSIS — F17210 Nicotine dependence, cigarettes, uncomplicated: Secondary | ICD-10-CM | POA: Diagnosis present

## 2021-12-25 DIAGNOSIS — N179 Acute kidney failure, unspecified: Principal | ICD-10-CM | POA: Diagnosis present

## 2021-12-25 DIAGNOSIS — I739 Peripheral vascular disease, unspecified: Secondary | ICD-10-CM | POA: Diagnosis present

## 2021-12-25 DIAGNOSIS — I63312 Cerebral infarction due to thrombosis of left middle cerebral artery: Secondary | ICD-10-CM | POA: Diagnosis not present

## 2021-12-25 DIAGNOSIS — F259 Schizoaffective disorder, unspecified: Secondary | ICD-10-CM | POA: Diagnosis present

## 2021-12-25 DIAGNOSIS — E875 Hyperkalemia: Secondary | ICD-10-CM | POA: Diagnosis present

## 2021-12-25 DIAGNOSIS — I130 Hypertensive heart and chronic kidney disease with heart failure and stage 1 through stage 4 chronic kidney disease, or unspecified chronic kidney disease: Secondary | ICD-10-CM | POA: Diagnosis present

## 2021-12-25 DIAGNOSIS — Z7902 Long term (current) use of antithrombotics/antiplatelets: Secondary | ICD-10-CM

## 2021-12-25 DIAGNOSIS — G9341 Metabolic encephalopathy: Secondary | ICD-10-CM | POA: Diagnosis present

## 2021-12-25 DIAGNOSIS — I5032 Chronic diastolic (congestive) heart failure: Secondary | ICD-10-CM | POA: Diagnosis present

## 2021-12-25 DIAGNOSIS — I10 Essential (primary) hypertension: Secondary | ICD-10-CM

## 2021-12-25 DIAGNOSIS — I69351 Hemiplegia and hemiparesis following cerebral infarction affecting right dominant side: Secondary | ICD-10-CM | POA: Diagnosis not present

## 2021-12-25 DIAGNOSIS — K922 Gastrointestinal hemorrhage, unspecified: Secondary | ICD-10-CM | POA: Diagnosis present

## 2021-12-25 DIAGNOSIS — Z79899 Other long term (current) drug therapy: Secondary | ICD-10-CM | POA: Diagnosis not present

## 2021-12-25 DIAGNOSIS — Z66 Do not resuscitate: Secondary | ICD-10-CM | POA: Diagnosis present

## 2021-12-25 DIAGNOSIS — E785 Hyperlipidemia, unspecified: Secondary | ICD-10-CM | POA: Diagnosis present

## 2021-12-25 DIAGNOSIS — Z8249 Family history of ischemic heart disease and other diseases of the circulatory system: Secondary | ICD-10-CM | POA: Diagnosis not present

## 2021-12-25 DIAGNOSIS — E870 Hyperosmolality and hypernatremia: Secondary | ICD-10-CM | POA: Diagnosis present

## 2021-12-25 DIAGNOSIS — D631 Anemia in chronic kidney disease: Secondary | ICD-10-CM | POA: Diagnosis present

## 2021-12-25 DIAGNOSIS — I639 Cerebral infarction, unspecified: Secondary | ICD-10-CM | POA: Diagnosis present

## 2021-12-25 DIAGNOSIS — E872 Acidosis, unspecified: Secondary | ICD-10-CM | POA: Diagnosis present

## 2021-12-25 DIAGNOSIS — Z515 Encounter for palliative care: Secondary | ICD-10-CM | POA: Diagnosis not present

## 2021-12-25 DIAGNOSIS — N184 Chronic kidney disease, stage 4 (severe): Secondary | ICD-10-CM | POA: Diagnosis present

## 2021-12-25 DIAGNOSIS — N170 Acute kidney failure with tubular necrosis: Principal | ICD-10-CM

## 2021-12-25 DIAGNOSIS — E860 Dehydration: Secondary | ICD-10-CM | POA: Diagnosis present

## 2021-12-25 DIAGNOSIS — K219 Gastro-esophageal reflux disease without esophagitis: Secondary | ICD-10-CM | POA: Diagnosis present

## 2021-12-25 DIAGNOSIS — Z91014 Allergy to mammalian meats: Secondary | ICD-10-CM | POA: Diagnosis not present

## 2021-12-25 DIAGNOSIS — E861 Hypovolemia: Secondary | ICD-10-CM | POA: Diagnosis present

## 2021-12-25 LAB — BASIC METABOLIC PANEL
Anion gap: 8 (ref 5–15)
BUN: 154 mg/dL — ABNORMAL HIGH (ref 8–23)
CO2: 22 mmol/L (ref 22–32)
Calcium: 8.9 mg/dL (ref 8.9–10.3)
Chloride: 125 mmol/L — ABNORMAL HIGH (ref 98–111)
Creatinine, Ser: 13.1 mg/dL — ABNORMAL HIGH (ref 0.61–1.24)
GFR, Estimated: 4 mL/min — ABNORMAL LOW (ref >60)
Glucose, Bld: 114 mg/dL — ABNORMAL HIGH (ref 70–99)
Potassium: 6.1 mmol/L — ABNORMAL HIGH (ref 3.5–5.1)
Sodium: 155 mmol/L — ABNORMAL HIGH (ref 135–145)

## 2021-12-25 LAB — CBC WITH DIFFERENTIAL/PLATELET
Abs Immature Granulocytes: 0.05 10*3/uL (ref 0.00–0.07)
Basophils Absolute: 0 10*3/uL (ref 0.0–0.1)
Basophils Relative: 0 %
Eosinophils Absolute: 0.3 10*3/uL (ref 0.0–0.5)
Eosinophils Relative: 4 %
HCT: 21.7 % — ABNORMAL LOW (ref 39.0–52.0)
Hemoglobin: 6.4 g/dL — CL (ref 13.0–17.0)
Immature Granulocytes: 1 %
Lymphocytes Relative: 17 %
Lymphs Abs: 1.5 10*3/uL (ref 0.7–4.0)
MCH: 26.6 pg (ref 26.0–34.0)
MCHC: 29.5 g/dL — ABNORMAL LOW (ref 30.0–36.0)
MCV: 90 fL (ref 80.0–100.0)
Monocytes Absolute: 0.7 10*3/uL (ref 0.1–1.0)
Monocytes Relative: 8 %
Neutro Abs: 6.3 10*3/uL (ref 1.7–7.7)
Neutrophils Relative %: 70 %
Platelets: 122 10*3/uL — ABNORMAL LOW (ref 150–400)
RBC: 2.41 MIL/uL — ABNORMAL LOW (ref 4.22–5.81)
RDW: 17.2 % — ABNORMAL HIGH (ref 11.5–15.5)
WBC: 8.9 10*3/uL (ref 4.0–10.5)
nRBC: 0 % (ref 0.0–0.2)

## 2021-12-25 LAB — POC OCCULT BLOOD, ED: Fecal Occult Bld: POSITIVE — AB

## 2021-12-25 LAB — MAGNESIUM: Magnesium: 3.5 mg/dL — ABNORMAL HIGH (ref 1.7–2.4)

## 2021-12-25 LAB — PREPARE RBC (CROSSMATCH)

## 2021-12-25 LAB — PHOSPHORUS: Phosphorus: 6.1 mg/dL — ABNORMAL HIGH (ref 2.5–4.6)

## 2021-12-25 MED ORDER — CALCIUM GLUCONATE-NACL 1-0.675 GM/50ML-% IV SOLN
INTRAVENOUS | Status: AC
  Filled 2021-12-25: qty 50

## 2021-12-25 MED ORDER — PANTOPRAZOLE SODIUM 40 MG PO TBEC
40.0000 mg | DELAYED_RELEASE_TABLET | Freq: Every day | ORAL | Status: DC
  Administered 2021-12-25 – 2021-12-28 (×2): 40 mg via ORAL
  Filled 2021-12-25 (×5): qty 1

## 2021-12-25 MED ORDER — ACETAMINOPHEN 325 MG PO TABS
650.0000 mg | ORAL_TABLET | Freq: Four times a day (QID) | ORAL | Status: DC | PRN
  Administered 2021-12-26 – 2021-12-28 (×2): 650 mg via ORAL
  Filled 2021-12-25: qty 2

## 2021-12-25 MED ORDER — FAMOTIDINE 20 MG PO TABS
20.0000 mg | ORAL_TABLET | Freq: Every day | ORAL | Status: DC
  Administered 2021-12-25 – 2021-12-26 (×2): 20 mg via ORAL
  Filled 2021-12-25 (×2): qty 1

## 2021-12-25 MED ORDER — SODIUM CHLORIDE 0.9 % IV SOLN: 10.0000 mL/h | Freq: Once | INTRAVENOUS | Status: DC

## 2021-12-25 MED ORDER — VITAMIN D 25 MCG (1000 UNIT) PO TABS
2000.0000 [IU] | ORAL_TABLET | Freq: Every day | ORAL | Status: DC
  Administered 2021-12-25 – 2021-12-28 (×2): 2000 [IU] via ORAL
  Filled 2021-12-25 (×4): qty 2

## 2021-12-25 MED ORDER — VALPROATE SODIUM 250 MG/5ML PO SOLN
500.0000 mg | Freq: Three times a day (TID) | ORAL | Status: DC
  Administered 2021-12-25 – 2021-12-28 (×5): 500 mg via ORAL
  Filled 2021-12-25 (×12): qty 10

## 2021-12-25 MED ORDER — SODIUM ZIRCONIUM CYCLOSILICATE 5 G PO PACK
10.0000 g | PACK | Freq: Once | ORAL | Status: AC
  Administered 2021-12-25: 10 g via ORAL
  Filled 2021-12-25: qty 2

## 2021-12-25 MED ORDER — CLOPIDOGREL BISULFATE 75 MG PO TABS
75.0000 mg | ORAL_TABLET | Freq: Every day | ORAL | Status: DC
  Administered 2021-12-25 – 2021-12-28 (×2): 75 mg via ORAL
  Filled 2021-12-25 (×4): qty 1

## 2021-12-25 MED ORDER — LACTATED RINGERS IV SOLN: INTRAVENOUS | Status: DC

## 2021-12-25 MED ORDER — SODIUM BICARBONATE 650 MG PO TABS
650.0000 mg | ORAL_TABLET | Freq: Three times a day (TID) | ORAL | Status: DC
  Administered 2021-12-25 (×2): 650 mg via ORAL
  Filled 2021-12-25 (×2): qty 1

## 2021-12-25 MED ORDER — PANTOPRAZOLE SODIUM 40 MG IV SOLR
40.0000 mg | Freq: Once | INTRAVENOUS | Status: AC
  Administered 2021-12-25: 40 mg via INTRAVENOUS
  Filled 2021-12-25: qty 10

## 2021-12-25 MED ORDER — CALCIUM GLUCONATE-NACL 1-0.675 GM/50ML-% IV SOLN
1.0000 g | Freq: Once | INTRAVENOUS | Status: AC
  Administered 2021-12-25: 1000 mg via INTRAVENOUS
  Filled 2021-12-25: qty 50

## 2021-12-25 MED ORDER — MIRTAZAPINE 15 MG PO TABS
7.5000 mg | ORAL_TABLET | Freq: Every day | ORAL | Status: DC
  Administered 2021-12-25 – 2021-12-26 (×2): 7.5 mg via ORAL
  Filled 2021-12-25 (×2): qty 1

## 2021-12-25 MED ORDER — CALCIUM CARBONATE ANTACID 500 MG PO CHEW
2.0000 | CHEWABLE_TABLET | Freq: Three times a day (TID) | ORAL | Status: DC
  Administered 2021-12-25 – 2021-12-28 (×5): 400 mg via ORAL
  Filled 2021-12-25 (×7): qty 2

## 2021-12-25 MED ORDER — FUROSEMIDE 10 MG/ML IJ SOLN
20.0000 mg | Freq: Once | INTRAMUSCULAR | Status: AC
  Administered 2021-12-25: 20 mg via INTRAVENOUS
  Filled 2021-12-25: qty 2

## 2021-12-25 MED ORDER — ORAL CARE MOUTH RINSE: 15.0000 mL | OROMUCOSAL | Status: DC | PRN

## 2021-12-25 MED ORDER — ACETAMINOPHEN 650 MG RE SUPP
650.0000 mg | Freq: Four times a day (QID) | RECTAL | Status: DC | PRN
  Administered 2021-12-28: 650 mg via RECTAL
  Filled 2021-12-25 (×2): qty 1

## 2021-12-25 MED ORDER — ONDANSETRON HCL 4 MG PO TABS: 4.0000 mg | ORAL_TABLET | Freq: Four times a day (QID) | ORAL | Status: DC | PRN

## 2021-12-25 MED ORDER — ONDANSETRON HCL 4 MG/2ML IJ SOLN: 4.0000 mg | Freq: Four times a day (QID) | INTRAMUSCULAR | Status: DC | PRN

## 2021-12-25 MED ORDER — ATORVASTATIN CALCIUM 40 MG PO TABS
40.0000 mg | ORAL_TABLET | Freq: Every day | ORAL | Status: DC
  Administered 2021-12-25: 40 mg via ORAL
  Filled 2021-12-25: qty 1

## 2021-12-25 MED ORDER — SERTRALINE HCL 50 MG PO TABS
50.0000 mg | ORAL_TABLET | Freq: Every day | ORAL | Status: DC
  Administered 2021-12-25 – 2021-12-28 (×2): 50 mg via ORAL
  Filled 2021-12-25 (×4): qty 1

## 2021-12-25 MED ORDER — SODIUM CHLORIDE 0.9 % IV BOLUS
2000.0000 mL | Freq: Once | INTRAVENOUS | Status: AC
  Administered 2021-12-25: 2000 mL via INTRAVENOUS

## 2021-12-25 MED ORDER — CARVEDILOL 3.125 MG PO TABS
6.2500 mg | ORAL_TABLET | Freq: Two times a day (BID) | ORAL | Status: DC
  Administered 2021-12-25 – 2021-12-28 (×4): 6.25 mg via ORAL
  Filled 2021-12-25 (×6): qty 2

## 2021-12-25 MED ORDER — RISPERIDONE 0.5 MG PO TABS
0.2500 mg | ORAL_TABLET | Freq: Two times a day (BID) | ORAL | Status: DC
  Administered 2021-12-25 – 2021-12-28 (×4): 0.25 mg via ORAL
  Filled 2021-12-25 (×6): qty 1

## 2021-12-25 NOTE — ED Provider Notes (Signed)
[facility] Provider Note   CSN: [id number] Arrival date & time: 12/25/21  1142     History {Add pertinent medical, surgical, social history, OB history to HPI:1} No chief complaint on file.   Lucas [name] is a 71 y.o. male.  Patient with history of stroke congestive heart failure dementia.  He was at the nursing home and they checked some labs and his potassium was high and his hemoglobin was low   Weakness      Home Medications Prior to Admission medications   Medication Sig Start Date End Date Taking? Authorizing Provider  acetaminophen (TYLENOL) 325 MG tablet Take 650 mg by mouth in the morning, at noon, and at bedtime.   Yes [provider]  amLODipine (NORVASC) 10 MG tablet Take 1 tablet (10 mg total) by mouth daily. 02/08/21  Yes Johnson, Clanford L, MD  ascorbic acid (VITAMIN C) 500 MG tablet Take 1 tablet (500 mg total) by mouth daily. 02/08/21  Yes Johnson, Clanford L, MD  atorvastatin (LIPITOR) 40 MG tablet Take 1 tablet (40 mg total) by mouth daily at 6 PM. 06/02/17  Yes Patrecia Pour, Christean Grief, MD  calcium carbonate (TUMS) 500 MG chewable tablet Chew 2 tablets (400 mg of elemental calcium total) by mouth 3 (three) times daily. 02/08/21 02/08/22 Yes Johnson, Clanford L, MD  carvedilol (COREG) 6.25 MG tablet Take 1 tablet (6.25 mg total) by mouth 2 (two) times daily with a meal. 09/03/19  Yes Tat, Shanon Brow, MD  Cholecalciferol 50 MCG (2000 UT) TABS Take 1 tablet by mouth daily.   Yes [provider]  clopidogrel (PLAVIX) 75 MG tablet Take 1 tablet (75 mg total) by mouth daily. 06/03/17  Yes Patrecia Pour, Christean Grief, MD  famotidine (PEPCID) 20 MG tablet Take 1 tablet (20 mg total) by mouth 2 (two) times daily. 02/08/21 02/08/22 Yes Johnson, Clanford L, MD  ferrous sulfate 325 (65 FE) MG EC tablet Take 1 tablet (325 mg total) by mouth daily with breakfast. 02/08/21 02/08/22 Yes Johnson, Clanford L, MD  hydrALAZINE (APRESOLINE) 100 MG tablet Take  100 mg by mouth 3 (three) times daily.   Yes [provider]  mirtazapine (REMERON) 15 MG tablet Take 0.5 tablets by mouth at bedtime.   Yes [provider]  Patiromer Sorbitex Calcium (VELTASSA) 16.8 g PACK Take 1 packet by mouth daily. 02/08/21  Yes Johnson, Clanford L, MD  risperiDONE (RISPERDAL) 0.25 MG tablet Take 0.25 mg by mouth 2 (two) times daily.   Yes [provider]  sertraline (ZOLOFT) 100 MG tablet Take 1.5 tablets (150 mg total) by mouth daily. Patient taking differently: Take 50 mg by mouth daily. 02/08/21 02/08/22 Yes Johnson, Eldridge Dace, MD  Skin Protectants, Misc. (MINERIN CREME) CREA Apply 1 application  topically every 12 (twelve) hours as needed (skin hydration). 05/21/19  Yes [provider]  sodium bicarbonate 325 MG tablet Take 2 tablets (650 mg total) by mouth 2 (two) times daily. Patient taking differently: Take 650 mg by mouth 3 (three) times daily. 02/08/21  Yes Johnson, Clanford L, MD  Valproate Sodium (DEPAKENE) 250 MG/5ML SOLN solution Take 10 mLs by mouth 3 (three) times daily.   Yes [provider]      Allergies    Pork-derived products    Review of Systems   Review of Systems  Neurological:  Positive for weakness.    Physical Exam Updated Vital Signs BP 136/63   Pulse 70   Temp 98.5 F (36.9 C) (Oral)  Resp 19   Ht 5\' 5"  (1.651 m)   Wt 47.6 kg   SpO2 99%   BMI 17.47 kg/m  Physical Exam  ED Results / Procedures / Treatments   Labs (all labs ordered are listed, but only abnormal results are displayed) Labs Reviewed  CBC WITH DIFFERENTIAL/PLATELET - Abnormal; Notable for the following components:      Result Value   RBC 2.41 (*)    Hemoglobin 6.4 (*)    HCT 21.7 (*)    MCHC 29.5 (*)    RDW 17.2 (*)    Platelets 122 (*)    All other components within normal limits  BASIC METABOLIC PANEL - Abnormal; Notable for the following components:   Sodium 155 (*)    Potassium 6.1 (*)    Chloride 125 (*)     Glucose, Bld 114 (*)    BUN 154 (*)    Creatinine, Ser 13.10 (*)    GFR, Estimated 4 (*)    All other components within normal limits  POC OCCULT BLOOD, ED - Abnormal; Notable for the following components:   Fecal Occult Bld POSITIVE (*)    All other components within normal limits  OCCULT BLOOD X 1 CARD TO LAB, STOOL  SODIUM, URINE, RANDOM  OSMOLALITY, URINE  MAGNESIUM  PHOSPHORUS  TYPE AND SCREEN  PREPARE RBC (CROSSMATCH)    EKG EKG Interpretation  Date/Time:  Thursday December 25 2021 11:51:46 EDT Ventricular Rate:  76 PR Interval:  64 QRS Duration: 110 QT Interval:  404 QTC Calculation: 455 R Axis:   65 Text Interpretation: Sinus or ectopic atrial rhythm Short PR interval Probable lateral infarct, old Minimal ST elevation, anterior leads Baseline wander in lead(s) V3 V6 Confirmed by Milton Ferguson [phone]) on 12/25/2021 12:23:17 PM  Radiology No results found.  Procedures Procedures  {Document cardiac monitor, telemetry assessment procedure when appropriate:1}  Medications Ordered in ED Medications  0.9 %  sodium chloride infusion (has no administration in time range)  sodium zirconium cyclosilicate (LOKELMA) packet 10 g (has no administration in time range)  atorvastatin (LIPITOR) tablet 40 mg (has no administration in time range)  carvedilol (COREG) tablet 6.25 mg (has no administration in time range)  calcium carbonate (TUMS - dosed in mg elemental calcium) chewable tablet 400 mg of elemental calcium (has no administration in time range)  clopidogrel (PLAVIX) tablet 75 mg (has no administration in time range)  cholecalciferol (VITAMIN D3) 25 MCG (1000 UNIT) tablet 2,000 Units (has no administration in time range)  famotidine (PEPCID) tablet 20 mg (has no administration in time range)  mirtazapine (REMERON) tablet 7.5 mg (has no administration in time range)  risperiDONE (RISPERDAL) tablet 0.25 mg (has no administration in time range)  Valproate Sodium  (DEPAKENE) solution 500 mg (has no administration in time range)  sertraline (ZOLOFT) tablet 50 mg (has no administration in time range)  sodium bicarbonate tablet 650 mg (has no administration in time range)  lactated ringers infusion (has no administration in time range)  furosemide (LASIX) injection 20 mg (has no administration in time range)  acetaminophen (TYLENOL) tablet 650 mg (has no administration in time range)    Or  acetaminophen (TYLENOL) suppository 650 mg (has no administration in time range)  ondansetron (ZOFRAN) tablet 4 mg (has no administration in time range)    Or  ondansetron (ZOFRAN) injection 4 mg (has no administration in time range)  pantoprazole (PROTONIX) EC tablet 40 mg (has no administration in time range)  pantoprazole (PROTONIX) injection 40  mg (40 mg Intravenous Given 12/25/21 1413)  sodium chloride 0.9 % bolus 2,000 mL (2,000 mLs Intravenous New Bag/Given 12/25/21 1413)    ED Course/ Medical Decision Making/ A&P  CRITICAL CARE Performed by: Milton Ferguson Total critical care time: 40 minutes Critical care time was exclusive of separately billable procedures and treating other patients. Critical care was necessary to treat or prevent imminent or life-threatening deterioration. Critical care was time spent personally by me on the following activities: development of treatment plan with patient and/or surrogate as well as nursing, discussions with consultants, evaluation of patient's response to treatment, examination of patient, obtaining history from patient or surrogate, ordering and performing treatments and interventions, ordering and review of laboratory studies, ordering and review of radiographic studies, pulse oximetry and re-evaluation of patient's condition.                          Medical Decision Making Amount and/or Complexity of Data Reviewed Labs: ordered. Radiology: ordered.  Risk Prescription drug management. Decision regarding  hospitalization.  Patient with significant renal failure.  He also has anemia with positive Hemoccult.  I have consulted nephrology and they recommended hydration and kidney ultrasound.  They will consult on the patient.  Patient will be admitted to medicine  {Document critical care time when appropriate:1} {Document review of labs and clinical decision tools ie heart score, Chads2Vasc2 etc:1}  {Document your independent review of radiology images, and any outside records:1} {Document your discussion with family members, caretakers, and with consultants:1} {Document social determinants of health affecting pt's care:1} {Document your decision making why or why not admission, treatments were needed:1} Final Clinical Impression(s) / ED Diagnoses Final diagnoses:  Acute renal failure with tubular necrosis ([place])    Rx / DC Orders ED Discharge Orders     None

## 2021-12-25 NOTE — Assessment & Plan Note (Signed)
-  In the setting of worsening renal failure -Improved/resolved with fluid resuscitation and the use of Lokelma.

## 2021-12-25 NOTE — ED Notes (Signed)
Pt unable to sign for MSE, verbalized understanding  

## 2021-12-25 NOTE — H&P (Signed)
History and Physical    Patient: Lucas [name] GYI:[id number] DOB: July 04, 1950 DOA: 12/25/2021 DOS: the patient was seen and examined on 12/25/2021 PCP: System, Provider Not In  Patient coming from: SNF  Chief Complaint: No chief complaint on file.  HPI: Lucas [name] is a 71 y.o. male with medical history significant of chronic kidney disease stage IV, diastolic heart failure, hyperlipidemia, hypertension, gastroesophageal reflux disease, schizoaffective disorder and prior history of stroke; who was brought in from his skilled nursing facility secondary to abnormal labs.  There is also report from the facility of patient expressing no feeling good, but unable to elaborate on his specific symptoms.  Blood work at outside hospital demonstrating elevated potassium, worsening renal function and a hemoglobin of 7.  Patient reports decreased oral intake and nausea.  Patient denies chest pain, shortness of breath, vomiting, fever, chills, dysuria or hematuria; there was no hematochezia, any new focal deficits or any other complaints.  In the ED work-up demonstrates hemoglobin of 6.4, white blood cells 8.9 and platelet counts 350 K; basic metabolic panel with sodium at 155, potassium 6.1, chloride 125, BUN 154 and creatinine 13.10.  Nephrology service was consulted with recommendation for renal ultrasound, osmolality, urinalysis, initiation of fluid resuscitation, Lokelma, blood transfusion and supportive care.  They will see patient in consultation.  TR[facility] has been called to place patient in the hospital for further evaluation and management.  Review of Systems: As mentioned in the history of present illness. All other systems reviewed and are negative.  Past Medical History:  Diagnosis Date   Alcohol dependence ([place])    Altered mental status    Anorexia    Carotid artery stenosis    Cataract    CHF (congestive heart failure) ([facility])    Chronic diastolic heart failure ([facility])    Chronic kidney  disease, stage IV (severe) ([facility])    Cognitive deficits    CVA (cerebral infarction) 11/2010   CT 8/12 : Sm ischemic infarct posterior limb of L internal capsule. Carotids nl. ECHO nl with EF 55-60%. R sided weakness.    Dementia ([place])    Dementia ([place])    Dysphagia    Failure to thrive in adult    GI hemorrhage    Hyperlipemia    Hyperlipidemia 01/06/2011   Diagnosed during acute CVA hospitalization. On statin.   Hypertension 11/2010   Diagnosed during acute CVA hospitalization   Metabolic encephalopathy    Occlusion of anterior cerebral artery    Peripheral vascular disease ([facility])    Polysubstance abuse ([place]) 01/06/2011   cocaine, tobacco, THC   Psychotic disorder ([place])    PVD (peripheral vascular disease) ([place])    Right hemiplegia ([place])    Schizoaffective disorder ([place])    Stroke [facility])    Past Surgical History:  Procedure Laterality Date   ESOPHAGOGASTRODUODENOSCOPY (EGD) WITH PROPOFOL N/A 06/25/2017   Dr. Oneida Alar: small hiatal hernia, gastritis/duodenititis. benign biopsy without H.pylori.    Social History:  reports that he has been smoking cigarettes. He started smoking about 57 years ago. He has a 20.00 pack-year smoking history. He has never used smokeless tobacco. He reports current alcohol use of about 2.0 standard drinks of alcohol per week. He reports that he does not currently use drugs after having used the following drugs: Cocaine.  Allergies  Allergen Reactions   Pork-Derived Products Other (See Comments)    Patient does not eat pork products.    Family History  Problem Relation Age of Onset  Hypertension Mother    Hypertension Father     Prior to Admission medications   Medication Sig Start Date End Date Taking? Authorizing Provider  acetaminophen (TYLENOL) 325 MG tablet Take 650 mg by mouth in the morning, at noon, and at bedtime.   Yes [provider]  amLODipine (NORVASC) 10 MG tablet Take 1 tablet (10 mg total) by mouth daily. 02/08/21  Yes  Johnson, Clanford L, MD  ascorbic acid (VITAMIN C) 500 MG tablet Take 1 tablet (500 mg total) by mouth daily. 02/08/21  Yes Johnson, Clanford L, MD  atorvastatin (LIPITOR) 40 MG tablet Take 1 tablet (40 mg total) by mouth daily at 6 PM. 06/02/17  Yes Patrecia Pour, Christean Grief, MD  calcium carbonate (TUMS) 500 MG chewable tablet Chew 2 tablets (400 mg of elemental calcium total) by mouth 3 (three) times daily. 02/08/21 02/08/22 Yes Johnson, Clanford L, MD  carvedilol (COREG) 6.25 MG tablet Take 1 tablet (6.25 mg total) by mouth 2 (two) times daily with a meal. 09/03/19  Yes Tat, Shanon Brow, MD  Cholecalciferol 50 MCG (2000 UT) TABS Take 1 tablet by mouth daily.   Yes [provider]  clopidogrel (PLAVIX) 75 MG tablet Take 1 tablet (75 mg total) by mouth daily. 06/03/17  Yes Patrecia Pour, Christean Grief, MD  famotidine (PEPCID) 20 MG tablet Take 1 tablet (20 mg total) by mouth 2 (two) times daily. 02/08/21 02/08/22 Yes Johnson, Clanford L, MD  ferrous sulfate 325 (65 FE) MG EC tablet Take 1 tablet (325 mg total) by mouth daily with breakfast. 02/08/21 02/08/22 Yes Johnson, Clanford L, MD  hydrALAZINE (APRESOLINE) 100 MG tablet Take 100 mg by mouth 3 (three) times daily.   Yes [provider]  mirtazapine (REMERON) 15 MG tablet Take 0.5 tablets by mouth at bedtime.   Yes [provider]  Patiromer Sorbitex Calcium (VELTASSA) 16.8 g PACK Take 1 packet by mouth daily. 02/08/21  Yes Johnson, Clanford L, MD  risperiDONE (RISPERDAL) 0.25 MG tablet Take 0.25 mg by mouth 2 (two) times daily.   Yes [provider]  sertraline (ZOLOFT) 100 MG tablet Take 1.5 tablets (150 mg total) by mouth daily. Patient taking differently: Take 50 mg by mouth daily. 02/08/21 02/08/22 Yes Johnson, Eldridge Dace, MD  Skin Protectants, Misc. (MINERIN CREME) CREA Apply 1 application  topically every 12 (twelve) hours as needed (skin hydration). 05/21/19  Yes [provider]  sodium bicarbonate 325 MG tablet Take 2  tablets (650 mg total) by mouth 2 (two) times daily. Patient taking differently: Take 650 mg by mouth 3 (three) times daily. 02/08/21  Yes Johnson, Clanford L, MD  Valproate Sodium (DEPAKENE) 250 MG/5ML SOLN solution Take 10 mLs by mouth 3 (three) times daily.   Yes [provider]    Physical Exam: Vitals:   12/25/21 1153 12/25/21 1200 12/25/21 1230 12/25/21 1300  BP:  (!) 125/48 (!) 128/55 136/63  Pulse:  73 65 70  Resp:  15 13 19   Temp:      TempSrc:      SpO2:  100% 100% 99%  Weight: 47.6 kg     Height: 5\' 5"  (1.651 m)      General exam: Alert, awake, oriented x 1; no chest pain, no nausea or vomiting.  Able to follow very simple commands. Respiratory system: Clear to auscultation. Respiratory effort normal.  Good saturation on room air. Cardiovascular system:RRR. No rubs or gallops. Gastrointestinal system: Abdomen is nondistended, soft and nontender. No organomegaly or masses felt. Normal  bowel sounds heard. Central nervous system: No new focal deficits; oriented x1. Extremities: No cyanosis or clubbing. Skin: No petechiae. Psychiatry: Judgement and insight appear impaired secondary to dementia.  Data Reviewed: Basic metabolic panel: Sodium 160, potassium 6.1, chloride 125, CO2 22, glucose 114, BUN 154, creatinine 13.10, calcium 9.9, GFR 4. CBC: WBCs 8.9, hemoglobin 6.4, platelet count 122 K Occult blood card positive. Renal ultrasound: Pending   Assessment and Plan: * Acute renal failure superimposed on stage 4 chronic kidney disease, unspecified acute renal failure type ([place]) - In the setting of prerenal azotemia and dehydration versus further progression of underlying renal failure. -Patient with stage IV chronic kidney disease at baseline -Nephrology service has been consulted -Given elevated potassium and Lokelma, sodium bicarbonate and calcium gluconate provided -Fluid resuscitation will be given, renal ultrasound will be assessed -Patient admitted to  telemetry bed -Appears to be a very poor candidate for hemodialysis -at High risk for decompensation and further complications; patient is DNR.  Schizoaffective disorder ([place]) - Continue supportive care -Resume home mood stabilizing agents. -Follow clinical response.  Acute metabolic encephalopathy - Possible from dehydration, uremia with worsening renal function and decrease hemoglobin level causing hypoperfusion to the brain. -Patient will receive blood transfusion -Provide fluid resuscitation -Follow nephrology service recommendation regarding any further intervention for his kidney function. -Consult reorientation and supportive care will be provided.  Hyperkalemia - In the setting of worsening renal failure -Provide fluid resuscitation, start treatment with calcium gluconate, Lokelma and sodium bicarbonate -Follow electrolytes trend -Telemetry monitoring.  Cerebrovascular accident (CVA) ([place]) - No new focal deficits appreciated -Continue risk factor modification. -Continue the use of aspirin for secondary prevention.  Hyperlipidemia with target LDL less than 100 - Continue statins.  HTN (hypertension) - Stable overall -Continue current antihypertensive agent -Follow-up vital signs. -Avoid hypotension      Advance Care Planning:   Code Status: DNR   Consults: Nephrology service  Family Communication: No family at bedside.  Severity of Illness: The appropriate patient status for this patient is INPATIENT. Inpatient status is judged to be reasonable and necessary in order to provide the required intensity of service to ensure the patient's safety. The patient's presenting symptoms, physical exam findings, and initial radiographic and laboratory data in the context of their chronic comorbidities is felt to place them at high risk for further clinical deterioration. Furthermore, it is not anticipated that the patient will be medically stable for discharge from the  hospital within 2 midnights of admission.   * I certify that at the point of admission it is my clinical judgment that the patient will require inpatient hospital care spanning beyond 2 midnights from the point of admission due to high intensity of service, high risk for further deterioration and high frequency of surveillance required.*  Author: Barton Dubois, MD 12/25/2021 2:44 PM  For on call review [email or web address].

## 2021-12-25 NOTE — Assessment & Plan Note (Signed)
-  In the setting of prerenal azotemia and dehydration versus further progression of underlying renal failure. -Patient with stage IV chronic kidney disease at baseline. -After long discussion with daughter and niece patient apparently was already involved in palliative care as an outpatient to be followed making at nursing home.  Patient has expressed in the past no further dialysis or intervention.  This is something that has been ongoing for the last 8-73-month according to the niece. -After receiving this information and further discussing with family members decision was made to finalize stabilization for just 24 hours while arranging patient to go back to nursing home with palliative care following him up. -Appreciate assistance and recommendations by nephrology service -Renal ultrasound demonstrated no hydronephrosis. -Plan is for comfort care and palliative management at discharge. -Patient is DNR.

## 2021-12-25 NOTE — ED Triage Notes (Addendum)
Pt BIB by [facility] from [facility] for abnormal labs, per facility pt has low RBC (2.55) , Hgb (7.0) and potassium (8.0); staff reported to EMS that pt has not looked good all week but was unable to describe symptoms, per EMS v/s stable other than BP of 102/36 en route, triage BP 141/93, pt reports low HGB is WNL for him

## 2021-12-25 NOTE — Assessment & Plan Note (Signed)
-  Possible from dehydration, uremia with worsening renal function, hypernatremia and decrease hemoglobin level causing hypoperfusion to the brain. -Status post 2 units PRBCs with good response in his hemoglobin. -patient will be transfer back to nursing home with palliative care management. -No future hospitalization -Comfort care and symptomatic management only.

## 2021-12-25 NOTE — ED Notes (Signed)
ED TO INPATIENT HANDOFF REPORT  ED Nurse Name and Phone #:   S Name/Age/Gender Lucas [name] 71 y.o. male Room/Bed: APA02/APA02  Code Status   Code Status: Prior  Home/SNF/Other [facility] Patient oriented to: self and place Is this baseline? Yes   Triage Complete: Triage complete  Chief Complaint Acute renal failure superimposed on stage 4 chronic kidney disease, unspecified acute renal failure type ([place]) [N17.9, N18.4]  Triage Note Pt BIB by [facility] from [facility] for abnormal labs, per facility pt has low RBC (2.55) , Hgb (7.0) and potassium (8.0); staff reported to EMS that pt has not looked good all week but was unable to describe symptoms, per EMS v/s stable other than BP of 102/36 en route, triage BP 141/93, pt reports low HGB is WNL for him    Allergies Allergies  Allergen Reactions   Pork-Derived Products Other (See Comments)    Patient does not eat pork products.    Level of Care/Admitting Diagnosis ED Disposition     ED Disposition  Admit   Condition  --   [place]: [facility] [[id number]]  Level of Care: Telemetry [5]  Covid Evaluation: Asymptomatic - no recent exposure (last 10 days) testing not required  Diagnosis: Acute renal failure superimposed on stage 4 chronic kidney disease, unspecified acute renal failure type [facility]) [[id number]]  Admitting Physician: Barton Dubois [[id number]]  Attending Physician: Barton Dubois [[id number]]  Certification:: I certify this patient will need inpatient services for at least 2 midnights  Estimated Length of Stay: 3          B Medical/Surgery History Past Medical History:  Diagnosis Date   Alcohol dependence ([place])    Altered mental status    Anorexia    Carotid artery stenosis    Cataract    CHF (congestive heart failure) (HCC)    Chronic diastolic heart failure (HCC)    Chronic kidney disease, stage IV (severe) (HCC)    Cognitive deficits    CVA (cerebral infarction) 11/2010   CT 8/12 :  Sm ischemic infarct posterior limb of L internal capsule. Carotids nl. ECHO nl with EF 55-60%. R sided weakness.    Dementia ([place])    Dementia ([place])    Dysphagia    Failure to thrive in adult    GI hemorrhage    Hyperlipemia    Hyperlipidemia 01/06/2011   Diagnosed during acute CVA hospitalization. On statin.   Hypertension 11/2010   Diagnosed during acute CVA hospitalization   Metabolic encephalopathy    Occlusion of anterior cerebral artery    Peripheral vascular disease (HCC)    Polysubstance abuse ([place]) 01/06/2011   cocaine, tobacco, THC   Psychotic disorder ([place])    PVD (peripheral vascular disease) ([place])    Right hemiplegia ([place])    Schizoaffective disorder ([place])    Stroke [facility])    Past Surgical History:  Procedure Laterality Date   ESOPHAGOGASTRODUODENOSCOPY (EGD) WITH PROPOFOL N/A 06/25/2017   Dr. Oneida Alar: small hiatal hernia, gastritis/duodenititis. benign biopsy without H.pylori.      A IV Location/Drains/Wounds Patient Lines/Drains/Airways Status     Active Line/Drains/Airways     Name Placement date Placement time Site Days   Peripheral IV 12/25/21 Left;Posterior Hand 12/25/21  1222  Hand  less than 1   External Urinary Catheter 12/25/21  1200  --  less than 1            Intake/Output Last 24 hours No intake or output data in the  24 hours ending 12/25/21 1437  Labs/Imaging Results for orders placed or performed during the hospital encounter of 12/25/21 (from the past 48 hour(s))  CBC with Differential     Status: Abnormal   Collection Time: 12/25/21 12:22 PM  Result Value Ref Range   WBC 8.9 4.0 - 10.5 K/uL   RBC 2.41 (L) 4.22 - 5.81 MIL/uL   Hemoglobin 6.4 (LL) 13.0 - 17.0 g/dL    Comment: This critical result has verified and been called to Anniece Bleiler B. by Sharlot Gowda on 09 14 2023 at 1322, and has been read back.    HCT 21.7 (L) 39.0 - 52.0 %   MCV 90.0 80.0 - 100.0 fL   MCH 26.6 26.0 - 34.0 pg   MCHC 29.5 (L) 30.0 - 36.0 g/dL   RDW 17.2 (H)  11.5 - 15.5 %   Platelets 122 (L) 150 - 400 K/uL   nRBC 0.0 0.0 - 0.2 %   Neutrophils Relative % 70 %   Neutro Abs 6.3 1.7 - 7.7 K/uL   Lymphocytes Relative 17 %   Lymphs Abs 1.5 0.7 - 4.0 K/uL   Monocytes Relative 8 %   Monocytes Absolute 0.7 0.1 - 1.0 K/uL   Eosinophils Relative 4 %   Eosinophils Absolute 0.3 0.0 - 0.5 K/uL   Basophils Relative 0 %   Basophils Absolute 0.0 0.0 - 0.1 K/uL   WBC Morphology MORPHOLOGY UNREMARKABLE    RBC Morphology SCHISTOCYTES    Smear Review MORPHOLOGY UNREMARKABLE    Immature Granulocytes 1 %   Abs Immature Granulocytes 0.05 0.00 - 0.07 K/uL   Schistocytes PRESENT    Target Cells PRESENT     Comment: Performed at [facility], [address] [place], [place] [address]  Basic metabolic panel     Status: Abnormal   Collection Time: 12/25/21 12:22 PM  Result Value Ref Range   Sodium 155 (H) 135 - 145 mmol/L   Potassium 6.1 (H) 3.5 - 5.1 mmol/L   Chloride 125 (H) 98 - 111 mmol/L   CO2 22 22 - 32 mmol/L   Glucose, Bld 114 (H) 70 - 99 mg/dL    Comment: Glucose reference range applies only to samples taken after fasting for at least 8 hours.   BUN 154 (H) 8 - 23 mg/dL    Comment: RESULTS CONFIRMED BY MANUAL DILUTION   Creatinine, Ser 13.10 (H) 0.61 - 1.24 mg/dL   Calcium 8.9 8.9 - 10.3 mg/dL   GFR, Estimated 4 (L) >60 mL/min    Comment: (NOTE) Calculated using the CKD-EPI Creatinine Equation (2021)    Anion gap 8 5 - 15    Comment: Performed at [facility], [address], [place], [place] [address]  POC occult blood, ED     Status: Abnormal   Collection Time: 12/25/21  2:20 PM  Result Value Ref Range   Fecal Occult Bld POSITIVE (A) NEGATIVE   No results found.  Pending Labs Unresulted Labs (From admission, onward)     Start     Ordered   12/25/21 1405  Sodium, urine, random  Once,   URGENT        12/25/21 1404   12/25/21 1405  Osmolality, urine  Once,   URGENT        12/25/21 1404   12/25/21 1347  Prepare RBC (crossmatch)  (Adult  Blood Administration - PRBC)  Once,   R       Question Answer Comment  # of Units 2 units  Transfusion Indications Actively Bleeding / GI Bleed   Number of Units to Keep Ahead 2 units ahead   Keep ahead indications Active Bleeding/GI Bleed   If emergent release call blood bank Not emergent release      12/25/21 1348   12/25/21 1344  Type and screen  Once,   STAT        12/25/21 1343   12/25/21 1229  Occult blood card to lab, stool  Once,   URGENT        12/25/21 1228            Vitals/Pain Today's Vitals   12/25/21 1153 12/25/21 1200 12/25/21 1230 12/25/21 1300  BP:  (!) 125/48 (!) 128/55 136/63  Pulse:  73 65 70  Resp:  15 13 19   Temp:      TempSrc:      SpO2:  100% 100% 99%  Weight: 105 lb (47.6 kg)     Height: 5\' 5"  (1.651 m)     PainSc: 0-No pain       Isolation Precautions No active isolations  Medications Medications  0.9 %  sodium chloride infusion (has no administration in time range)  sodium zirconium cyclosilicate (LOKELMA) packet 10 g (has no administration in time range)  atorvastatin (LIPITOR) tablet 40 mg (has no administration in time range)  carvedilol (COREG) tablet 6.25 mg (has no administration in time range)  calcium carbonate (TUMS - dosed in mg elemental calcium) chewable tablet 400 mg of elemental calcium (has no administration in time range)  clopidogrel (PLAVIX) tablet 75 mg (has no administration in time range)  cholecalciferol (VITAMIN D3) 25 MCG (1000 UNIT) tablet 2,000 Units (has no administration in time range)  famotidine (PEPCID) tablet 20 mg (has no administration in time range)  mirtazapine (REMERON) tablet 7.5 mg (has no administration in time range)  risperiDONE (RISPERDAL) tablet 0.25 mg (has no administration in time range)  Valproate Sodium (DEPAKENE) solution 500 mg (has no administration in time range)  sertraline (ZOLOFT) tablet 50 mg (has no administration in time range)  sodium bicarbonate tablet 650 mg (has no  administration in time range)  lactated ringers infusion (has no administration in time range)  furosemide (LASIX) injection 20 mg (has no administration in time range)  pantoprazole (PROTONIX) injection 40 mg (40 mg Intravenous Given 12/25/21 1413)  sodium chloride 0.9 % bolus 2,000 mL (2,000 mLs Intravenous New Bag/Given 12/25/21 1413)    Mobility non-ambulatory Moderate fall risk   Focused Assessments    R Recommendations: See Admitting Provider Note  Report given to:   Additional Notes:

## 2021-12-25 NOTE — Assessment & Plan Note (Signed)
-  No new focal deficits appreciated -Continue risk factor modification. -Continue the use of aspirin for secondary prevention. -Ultimate goal of care is comfort and palliative management.

## 2021-12-25 NOTE — Assessment & Plan Note (Signed)
-  Stable overall -Continue current antihypertensive agents, as long as patient is able to take them by mouth. -Plan is for him to be discharged back to skilled nursing facility with palliative care follow-up and comfort management.

## 2021-12-25 NOTE — Assessment & Plan Note (Signed)
-  Continue statins.

## 2021-12-25 NOTE — Assessment & Plan Note (Signed)
-  Continue supportive care -Resume home mood stabilizing agents. -Plan is for comfort care and symptomatic management.

## 2021-12-26 DIAGNOSIS — I5032 Chronic diastolic (congestive) heart failure: Secondary | ICD-10-CM | POA: Diagnosis not present

## 2021-12-26 DIAGNOSIS — G9341 Metabolic encephalopathy: Secondary | ICD-10-CM | POA: Diagnosis not present

## 2021-12-26 DIAGNOSIS — E872 Acidosis, unspecified: Secondary | ICD-10-CM | POA: Diagnosis present

## 2021-12-26 DIAGNOSIS — I63312 Cerebral infarction due to thrombosis of left middle cerebral artery: Secondary | ICD-10-CM | POA: Diagnosis not present

## 2021-12-26 DIAGNOSIS — N179 Acute kidney failure, unspecified: Secondary | ICD-10-CM | POA: Diagnosis not present

## 2021-12-26 LAB — RENAL FUNCTION PANEL
Albumin: 3.1 g/dL — ABNORMAL LOW (ref 3.5–5.0)
Anion gap: 10 (ref 5–15)
BUN: 126 mg/dL — ABNORMAL HIGH (ref 8–23)
CO2: 18 mmol/L — ABNORMAL LOW (ref 22–32)
Calcium: 8.5 mg/dL — ABNORMAL LOW (ref 8.9–10.3)
Chloride: 124 mmol/L — ABNORMAL HIGH (ref 98–111)
Creatinine, Ser: 11.51 mg/dL — ABNORMAL HIGH (ref 0.61–1.24)
GFR, Estimated: 4 mL/min — ABNORMAL LOW (ref >60)
Glucose, Bld: 82 mg/dL (ref 70–99)
Phosphorus: 6 mg/dL — ABNORMAL HIGH (ref 2.5–4.6)
Potassium: 5.9 mmol/L — ABNORMAL HIGH (ref 3.5–5.1)
Sodium: 152 mmol/L — ABNORMAL HIGH (ref 135–145)

## 2021-12-26 LAB — CBC
HCT: 31 % — ABNORMAL LOW (ref 39.0–52.0)
Hemoglobin: 9.9 g/dL — ABNORMAL LOW (ref 13.0–17.0)
MCH: 28.1 pg (ref 26.0–34.0)
MCHC: 31.9 g/dL (ref 30.0–36.0)
MCV: 88.1 fL (ref 80.0–100.0)
Platelets: 105 10*3/uL — ABNORMAL LOW (ref 150–400)
RBC: 3.52 MIL/uL — ABNORMAL LOW (ref 4.22–5.81)
RDW: 16.1 % — ABNORMAL HIGH (ref 11.5–15.5)
WBC: 7.4 10*3/uL (ref 4.0–10.5)
nRBC: 0 % (ref 0.0–0.2)

## 2021-12-26 MED ORDER — SODIUM BICARBONATE 650 MG PO TABS
1300.0000 mg | ORAL_TABLET | Freq: Two times a day (BID) | ORAL | Status: DC
  Administered 2021-12-26 – 2021-12-28 (×2): 1300 mg via ORAL
  Filled 2021-12-26 (×4): qty 2

## 2021-12-26 MED ORDER — SODIUM CHLORIDE 0.45 % IV SOLN: INTRAVENOUS | Status: DC

## 2021-12-26 MED ORDER — PATIROMER SORBITEX CALCIUM 8.4 G PO PACK: 8.4000 g | PACK | Freq: Every day | ORAL | Status: DC

## 2021-12-26 MED ORDER — SODIUM ZIRCONIUM CYCLOSILICATE 10 G PO PACK
10.0000 g | PACK | Freq: Every day | ORAL | Status: DC
  Administered 2021-12-28: 10 g via ORAL
  Filled 2021-12-26 (×2): qty 1

## 2021-12-26 MED ORDER — SODIUM ZIRCONIUM CYCLOSILICATE 10 G PO PACK
10.0000 g | PACK | Freq: Once | ORAL | Status: AC
  Administered 2021-12-26: 10 g via ORAL
  Filled 2021-12-26: qty 1

## 2021-12-26 NOTE — Care Management Important Message (Signed)
Important Message  Patient Details  Name: Lucas [name] MRN: [id number] Date of Birth: 08-Jun-1950   Medicare Important Message Given:  Yes     Tommy Medal 12/26/2021, 11:47 AM

## 2021-12-26 NOTE — Assessment & Plan Note (Signed)
-   PhosLo initiated by nephrology service. -Will continue as long as patient able to tolerate by mouth -Main plan is for comfort care.

## 2021-12-26 NOTE — Progress Notes (Signed)
Progress Note   Patient: Lucas [name] VWU:[id number] DOB: January 14, 1951 DOA: 12/25/2021     1 DOS: the patient was seen and examined on 12/26/2021   Brief hospital course: CHUN SELLEN is a 71 y.o. male with medical history significant of chronic kidney disease stage IV, diastolic heart failure, hyperlipidemia, hypertension, gastroesophageal reflux disease, schizoaffective disorder and prior history of stroke; who was brought in from his skilled nursing facility secondary to abnormal labs.  There is also report from the facility of patient expressing no feeling good, but unable to elaborate on his specific symptoms.  Blood work at outside hospital demonstrating elevated potassium, worsening renal function and a hemoglobin of 7.  Patient reports decreased oral intake and nausea.   Patient denies chest pain, shortness of breath, vomiting, fever, chills, dysuria or hematuria; there was no hematochezia, any new focal deficits or any other complaints.   In the ED work-up demonstrates hemoglobin of 6.4, white blood cells 8.9 and platelet counts 295 K; basic metabolic panel with sodium at 155, potassium 6.1, chloride 125, BUN 154 and creatinine 13.10.  Nephrology service was consulted with recommendation for renal ultrasound, osmolality, urinalysis, initiation of fluid resuscitation, Lokelma, blood transfusion and supportive care.  They will see patient in consultation.  TRH has been called to place patient in the hospital for further evaluation and management.  Assessment and Plan: * Acute renal failure superimposed on stage 4 chronic kidney disease, unspecified acute renal failure type ([place]) - In the setting of prerenal azotemia and dehydration versus further progression of underlying renal failure. -Patient with stage IV chronic kidney disease at baseline -Continue to follow nephrology service recommendations -Renal ultrasound demonstrating no hydronephrosis. -IV fluids rate and type adjusted by  nephrology. Milly Jakob improved but is still elevated; planning to continue treatment with Lokelma. -Continue to follow renal function trend -Not a great candidate for hemodialysis therapy.  Hopefully will be avoided.  Palliative care has been consulted and will discuss with patient's daughter over the weekend further goals of care and advance directives. -at High risk for decompensation and further complications; patient is DNR.  Chronic diastolic CHF (congestive heart failure) (HCC) - Stable and compensated -Patient currently dry -Continue holding diuretics -Continue fluid resuscitation -Follow strict I's and O's.  Hyperphosphatemia - PhosLo initiated by nephrology service -Continue to follow electrolytes trend. -PTH has been ordered  Metabolic acidosis - In the setting of renal failure -Continue adjusted dose of sodium bicarbonate.  Schizoaffective disorder ([place]) -Continue supportive care -Resume home mood stabilizing agents. -Follow clinical response.  Acute metabolic encephalopathy -Possible from dehydration, uremia with worsening renal function, hypernatremia and decrease hemoglobin level causing hypoperfusion to the brain. -Status post 2 units PRBCs with good response in his hemoglobin -IV fluids will be continued, type and rate adjusted by nephrology to further help with hypernatremia. -BUN and creatinine improving [place] with fluid resuscitation.  Patient making more urine. -Continue to follow clinical response. -Continue constant reorientation.  Hyperkalemia -In the setting of worsening renal failure -Continue fluid resuscitation and the use of [place] telemetry monitoring -Follow nephrology service recommendations.   Cerebrovascular accident (CVA) ([place]) -No new focal deficits appreciated -Continue risk factor modification. -Continue the use of aspirin for secondary prevention.  Hyperlipidemia with target LDL less than 100 -Continue statins.  HTN  (hypertension) -Stable overall -Continue current antihypertensive agents -Follow vital signs.    Subjective:  -Afebrile, no chest pain, no nausea, no vomiting.  In no major distress currently.  No overnight events.  Physical  Exam: Vitals:   12/26/21 0257 12/26/21 0500 12/26/21 1300 12/26/21 1446  BP: 129/62  (!) 154/73 (!) 183/60  Pulse: 70  69 91  Resp: 16  (!) 22 16  Temp: 97.9 F (36.6 C)  98.4 F (36.9 C) 98.6 F (37 C)  TempSrc: Oral  Axillary Oral  SpO2: 99%     Weight:  49.4 kg    Height:       General exam: Alert, awake, oriented x 1; able to follow very simple commands only.  No chest pain, no nausea, no vomiting, no shortness of breath.  Chronically ill, frail and cachectic. Respiratory system: Clear to auscultation. Respiratory effort normal.  Good saturation on room air. Cardiovascular system:RRR. No rubs or gallops; no JVD. Gastrointestinal system: Abdomen is nondistended, soft and nontender. No organomegaly or masses felt. Normal bowel sounds heard. Central nervous system: No new focal deficits appreciated. Extremities: No cyanosis or clubbing. Skin: No petechiae. Psychiatry: Judgement and insight appear impaired secondary to underlying dementia.  Data Reviewed: Renal function panel: Sodium 152, potassium 5.9, chloride 124, bicarb 18, BUN 126, creatinine 11.51, GFR 4, phosphorus 6.0, calcium 8.5.  Anion gap 10 CBC: WBC 7.4, hemoglobin 9.9 (status posttransfusion), platelets count 105K. Magnesium 3.5.  Family Communication: No family at bedside.  Daughter updated over the phone. Disposition: Status is: Inpatient Remains inpatient appropriate because: Requiring IV treatment and further management. Is acute on chronic renal failure.   Planned Discharge Destination: Skilled nursing facility long-term resident.   Author: Barton Dubois, MD 12/26/2021 6:10 PM  For on call review [email or web address].

## 2021-12-26 NOTE — Assessment & Plan Note (Addendum)
-  Stable and compensated -Continue holding diuretics at this point. -Continue fluid resuscitation -Follow strict I's and O's. -Family goal of care comfort management and palliative.

## 2021-12-26 NOTE — Assessment & Plan Note (Signed)
-  In the setting of renal failure -Continue adjusted dose of sodium bicarbonate daily as long as he can take oral medications. -Overall plan is for comfort care.Marland Kitchen

## 2021-12-26 NOTE — TOC Initial Note (Addendum)
Transition of Care [facility]) - Initial/Assessment Note    Patient Details  Name: Lucas [name] MRN: [id number] Date of Birth: Jul 10, 1950  Transition of Care [facility]) CM/SW Contact:    Ihor Gully, LCSW Phone Number: 12/26/2021, 12:28 PM  Clinical Narrative:                 Patient is a long-term care resident at [facility]. Per Melissa at [facility], he can return at d/c. He feeds himself and is maximum assistance. He requires assistance from staff or a hoyer lift for transfers.   Expected Discharge Plan: Skilled Nursing Facility Barriers to Discharge: Continued Medical Work up   Patient Goals and CMS Choice Patient states their goals for this hospitalization and ongoing recovery are:: patient is a long term care resident at [facility]      Expected Discharge Plan and Services Expected Discharge Plan: [place]                                              Prior Living Arrangements/Services                       Activities of Daily Living   ADL Screening (condition at time of admission) Patient's cognitive ability adequate to safely complete daily activities?: Yes Is the patient deaf or have difficulty hearing?: No Does the patient have difficulty seeing, even when wearing glasses/contacts?: No Does the patient have difficulty concentrating, remembering, or making decisions?: No Patient able to express need for assistance with ADLs?: No Does the patient have difficulty dressing or bathing?: Yes Independently performs ADLs?: No Communication: Independent Dressing (OT): Dependent Is this a change from baseline?: Pre-admission baseline Grooming: Dependent Is this a change from baseline?: Pre-admission baseline Feeding: Dependent Is this a change from baseline?: Pre-admission baseline Bathing: Dependent Is this a change from baseline?: Pre-admission baseline Toileting: Dependent Is this a change from baseline?: Pre-admission  baseline In/Out Bed: Dependent Is this a change from baseline?: Pre-admission baseline Walks in Home: Dependent Is this a change from baseline?: Pre-admission baseline Does the patient have difficulty walking or climbing stairs?: Yes Weakness of Legs: Both Weakness of Arms/Hands: Both  Permission Sought/Granted                  Emotional Assessment       Orientation: : Oriented to Self Alcohol / Substance Use: Not Applicable Psych Involvement: No (comment)  Admission diagnosis:  Acute renal failure with tubular necrosis (HCC) [N17.0] Acute renal failure superimposed on stage 4 chronic kidney disease, unspecified acute renal failure type ([place]) [N17.9, N18.4] Patient Active Problem List   Diagnosis Date Noted   Acute renal failure superimposed on stage 4 chronic kidney disease, unspecified acute renal failure type ([place]) 12/25/2021   Schizoaffective disorder ([place]) 12/25/2021   Acute renal failure superimposed on stage 4 chronic kidney disease ([place]) 48/18/5631   Acute metabolic encephalopathy 49/70/2637   Hyperkalemia    Hypertensive urgency 08/29/2019   Acute on chronic renal failure ([place]) 08/29/2019   Hypoglycemia 08/28/2019   Normocytic anemia 11/03/2017   UGI bleed 06/24/2017   E coli bacteremia 06/17/2017   Altered mental status 06/15/2017   Pressure injury of skin 06/15/2017   AKI (acute kidney injury) ([place]) 06/15/2017   CKD (chronic kidney disease) stage 4, GFR 15-29 ml/min ([place]) 06/15/2017   History  of stroke 06/15/2017   Acute lower UTI 06/15/2017   Dehydration 06/15/2017   Cocaine abuse ([place])    Noncompliance w/medication treatment due to intermit use of medication    Middle cerebral artery stenosis, right    Stenosis of right carotid artery    Cerebrovascular accident (CVA) ([place])    Malignant hypertension 05/24/2017   Smoker 06/12/2014   Memory loss or impairment 02/13/2013   Right shoulder pain 02/21/2011   HTN (hypertension) 01/06/2011    Hyperlipidemia with target LDL less than 100 01/06/2011   Polysubstance abuse ([place]) 01/06/2011   CVA (cerebral infarction) 01/06/2011   Preventative health care 01/06/2011   PCP:  System, Provider Not In Pharmacy:   [place], [place] - [address] [place] [address] Phone: [phone] Fax: [phone]     Social Determinants of Health (SDOH) Interventions    Readmission Risk Interventions     No data to display

## 2021-12-26 NOTE — Consult Note (Addendum)
[facility] Nephrology Consultation Note  Requesting MD: Dr. Barton Dubois Reason for consult: AKI on CKD  HPI:  Lucas [name] is a 71 y.o. male with past medical history significant for hypertension, HLD, chronic diastolic CHF, carotid artery stenosis, stroke, dementia, CKD stage IV/V, anemia, GI bleed, chronic hyperkalemia, substance abuse in the past, schizoaffective disorder, who was brought in by EMS from [facility] for abnormal lab including hemoglobin of 7 and hyperkalemia, seen as a consultation for the evaluation of worsening renal failure/CKD. Reportedly he was expressing and not feeling good at the nursing home associated with decreased oral intake.  It seems like he has chronic dementia therefore unable to elaborate detailed review of system. In the ER, the labs showed hemoglobin of 6.4, stool occult positive, sodium 155, potassium 6.1, BUN 154 and creatinine level of 13.  He was treated with IV fluid for dehydration and hyperkalemia was treated medically.  He received 2 units of red blood cell transfusion for anemia.  The kidney ultrasound with chronicity and no hydronephrosis.  There is a hypoechoic structure in left kidney which require outpatient follow-up.  UA with chronic proteinuria.  He is now admitted for further management. It seems like he follows with Dr. Theador Hawthorne for his CKD.  I do not have recent baseline creatinine level however the last creatinine was 4.4 back in 01/2021.  The patient is not sure about his nephrology visit and discussion about dialysis.  It seems like he has chronic hyperkalemia therefore on Veltassa as outpatient. This morning the repeat labs showed both BUN and creatinine level trending down, potassium level is still at 5.9 and sodium 152. He is alert awake and denies all symptoms.  He is only oriented to himself but not oriented to place or date.  PMHx:   Past Medical History:  Diagnosis Date   Alcohol dependence ([place])     Altered mental status    Anorexia    Carotid artery stenosis    Cataract    CHF (congestive heart failure) (HCC)    Chronic diastolic heart failure (HCC)    Chronic kidney disease, stage IV (severe) (HCC)    Cognitive deficits    CVA (cerebral infarction) 11/2010   CT 8/12 : Sm ischemic infarct posterior limb of L internal capsule. Carotids nl. ECHO nl with EF 55-60%. R sided weakness.    Dementia ([place])    Dementia ([place])    Dysphagia    Failure to thrive in adult    GI hemorrhage    Hyperlipemia    Hyperlipidemia 01/06/2011   Diagnosed during acute CVA hospitalization. On statin.   Hypertension 11/2010   Diagnosed during acute CVA hospitalization   Metabolic encephalopathy    Occlusion of anterior cerebral artery    Peripheral vascular disease (HCC)    Polysubstance abuse ([place]) 01/06/2011   cocaine, tobacco, THC   Psychotic disorder ([place])    PVD (peripheral vascular disease) ([place])    Right hemiplegia ([place])    Schizoaffective disorder ([place])    Stroke [facility])     Past Surgical History:  Procedure Laterality Date   ESOPHAGOGASTRODUODENOSCOPY (EGD) WITH PROPOFOL N/A 06/25/2017   Dr. Oneida Alar: small hiatal hernia, gastritis/duodenititis. benign biopsy without H.pylori.     Family Hx:  Family History  Problem Relation Age of Onset   Hypertension Mother    Hypertension Father     Social History:  reports that he has been smoking cigarettes. He started smoking about 57 years ago. He has  a 20.00 pack-year smoking history. He has never used smokeless tobacco. He reports current alcohol use of about 2.0 standard drinks of alcohol per week. He reports that he does not currently use drugs after having used the following drugs: Cocaine.  Allergies:  Allergies  Allergen Reactions   Pork-Derived Products Other (See Comments)    Patient does not eat pork products.    Medications: Prior to Admission medications   Medication Sig Start Date End Date Taking? Authorizing Provider   acetaminophen (TYLENOL) 325 MG tablet Take 650 mg by mouth in the morning, at noon, and at bedtime.   Yes [provider]  amLODipine (NORVASC) 10 MG tablet Take 1 tablet (10 mg total) by mouth daily. 02/08/21  Yes Johnson, Clanford L, MD  ascorbic acid (VITAMIN C) 500 MG tablet Take 1 tablet (500 mg total) by mouth daily. 02/08/21  Yes Johnson, Clanford L, MD  atorvastatin (LIPITOR) 40 MG tablet Take 1 tablet (40 mg total) by mouth daily at 6 PM. 06/02/17  Yes Patrecia Pour, Christean Grief, MD  calcium carbonate (TUMS) 500 MG chewable tablet Chew 2 tablets (400 mg of elemental calcium total) by mouth 3 (three) times daily. 02/08/21 02/08/22 Yes Johnson, Clanford L, MD  carvedilol (COREG) 6.25 MG tablet Take 1 tablet (6.25 mg total) by mouth 2 (two) times daily with a meal. 09/03/19  Yes Tat, Shanon Brow, MD  Cholecalciferol 50 MCG (2000 UT) TABS Take 1 tablet by mouth daily.   Yes [provider]  clopidogrel (PLAVIX) 75 MG tablet Take 1 tablet (75 mg total) by mouth daily. 06/03/17  Yes Patrecia Pour, Christean Grief, MD  famotidine (PEPCID) 20 MG tablet Take 1 tablet (20 mg total) by mouth 2 (two) times daily. 02/08/21 02/08/22 Yes Johnson, Clanford L, MD  ferrous sulfate 325 (65 FE) MG EC tablet Take 1 tablet (325 mg total) by mouth daily with breakfast. 02/08/21 02/08/22 Yes Johnson, Clanford L, MD  hydrALAZINE (APRESOLINE) 100 MG tablet Take 100 mg by mouth 3 (three) times daily.   Yes [provider]  mirtazapine (REMERON) 15 MG tablet Take 0.5 tablets by mouth at bedtime.   Yes [provider]  Patiromer Sorbitex Calcium (VELTASSA) 16.8 g PACK Take 1 packet by mouth daily. 02/08/21  Yes Johnson, Clanford L, MD  risperiDONE (RISPERDAL) 0.25 MG tablet Take 0.25 mg by mouth 2 (two) times daily.   Yes [provider]  sertraline (ZOLOFT) 100 MG tablet Take 1.5 tablets (150 mg total) by mouth daily. Patient taking differently: Take 50 mg by mouth daily. 02/08/21 02/08/22 Yes  Johnson, Eldridge Dace, MD  Skin Protectants, Misc. (MINERIN CREME) CREA Apply 1 application  topically every 12 (twelve) hours as needed (skin hydration). 05/21/19  Yes [provider]  sodium bicarbonate 325 MG tablet Take 2 tablets (650 mg total) by mouth 2 (two) times daily. Patient taking differently: Take 650 mg by mouth 3 (three) times daily. 02/08/21  Yes Johnson, Clanford L, MD  Valproate Sodium (DEPAKENE) 250 MG/5ML SOLN solution Take 10 mLs by mouth 3 (three) times daily.   Yes [provider]    I have reviewed the patient's current medications.  Labs: Renal Panel: Recent Labs    02/07/21 1257 02/07/21 1949 02/08/21 0804 12/25/21 1222 12/25/21 1517 12/26/21 0614  NA 141 144 144 155*  --  152*  K 5.5* 4.7 4.9 6.1*  --  5.9*  CL 110 109 112* 125*  --  124*  CO2 21* 26 25 22   --  18*  GLUCOSE 97 70 85 114*  --  82  BUN 58* 57* 56* 154*  --  126*  CREATININE 4.38* 4.40* 4.44* 13.10*  --  11.51*  CALCIUM 7.9* 8.9 8.4* 8.9  --  8.5*  MG 2.9*  --   --   --  3.5*  --   PHOS 3.5  --  3.9  --  6.1* 6.0*  ALBUMIN 3.7  --  3.6  --   --  3.1*     CBC:    Latest Ref Rng & Units 12/26/2021    6:14 AM 12/25/2021   12:22 PM 02/08/2021    8:04 AM  CBC  WBC 4.0 - 10.5 K/uL 7.4  8.9  6.3   Hemoglobin 13.0 - 17.0 g/dL 9.9  6.4  9.6   Hematocrit 39.0 - 52.0 % 31.0  21.7  31.6   Platelets 150 - 400 K/uL 105  122  179      Anemia Panel:  Recent Labs    02/07/21 1257 02/07/21 1621 02/08/21 0804 12/25/21 1222 12/26/21 0614  HGB 8.4*  --  9.6* 6.4* 9.9*  MCV 91.8  --  90.8 90.0 88.1  FERRITIN  --  616*  --   --   --   TIBC  --  200*  --   --   --   IRON  --  48  --   --   --     Recent Labs  Lab 12/26/21 0614  ALBUMIN 3.1*    Lab Results  Component Value Date   HGBA1C 5.3 05/25/2017    ROS: Review of system is limited because patient has dementia.  Physical Exam: Vitals:   12/26/21 0023 12/26/21 0257  BP: 127/67 129/62  Pulse: 68 70  Resp: 16  16  Temp: 97.7 F (36.5 C) 97.9 F (36.6 C)  SpO2: 100% 99%     General exam: Appears calm and comfortable  Respiratory system: Clear to auscultation. Respiratory effort normal. No wheezing or crackle Cardiovascular system: S1 & S2 heard, RRR.  No pedal edema. Gastrointestinal system: Abdomen is nondistended, soft and nontender. Normal bowel sounds heard. Central nervous system: Alert awake but oriented to himself only. No focal neurological deficits. Extremities: Symmetric 5 x 5 power. Skin: No rashes, lesions or ulcers Psychiatry: Judgement and insight appear impaired and has dementia.  Assessment/Plan:  # AKI on CKD stage V: It appears that he has longstanding CKD stage V presumably due to hypertensive nephrosclerosis and was followed by Dr. Theador Hawthorne.  There is some hemodynamic changes leading to progressive CKD recently due to dehydration/decreased oral intake/severe anemia.  The creatinine level and BUN improving with IV hydration.  Noted around 350 cc of clear urine in the container and a kidney ultrasound rule out hydronephrosis.  I am changing IV fluid from LR to half NS because of hypernatremia and hyperkalemia.  Continue with strict ins and out and daily lab monitoring with IV fluid.  No urgent indication to start dialysis at this time however, he may need HD by early next week if no significant improvement in renal function.  Given his physical deconditioning and debility, I am concerned that the dialysis may not increase his quality of life and can prolong his suffering.  I will consult palliative care to address goals of care.  #Hyperkalemia: It seems like he has chronic hyperkalemia and on Veltassa as outpatient.  Not on any medication contributing directly to his hyperkalemia.  Treating medically with IV  fluid and Lokelma.  #Hypernatremia, hypovolemic: It seems dehydration therefore continue IV fluid.  Changing fluid to half NS.  #Severe anemia: Multifactorial etiology including  GI bleed and anemia of CKD.  Reportedly, stool occult was positive in ER.  Received 2 units of blood transfusion.  I will check iron studies.  #CKD-MBD: Phosphorus level mildly elevated.  I will check PTH and vitamin D level.  I will start PhosLo for hyperphosphatemia.  #Metabolic acidosis due to CKD: Increase sodium bicarbonate dose.  #Hypertension: Blood pressure acceptable.  He is currently on carvedilol.  Amlodipine is on hold.  Thank you for the consult. I will discuss with the primary team. We will review labs/chart over the weekend, please call us with any question.  Verdella Laidlaw Tanna Furry 12/26/2021, 8:39 AM  [facility].

## 2021-12-26 NOTE — NC FL2 (Signed)
[place] LEVEL OF CARE SCREENING TOOL     IDENTIFICATION  Patient Name: Lucas [name] Birthdate: [birth year] Sex: male Admission Date (Current Location): 12/25/2021  [facility] and [place] Number:  [employer] and Address:  [facility] [address] [place]      Provider Number: [id number]  Attending Physician Name and Address:  Barton Dubois, MD  Relative Name and Phone Number:  Daevion, Navarette (Sister)   [phone]    Current Level of Care: Hospital Recommended Level of Care: [place] Prior Approval Number:    Date Approved/Denied:   PASRR Number:    Discharge Plan: SNF    Current Diagnoses: Patient Active Problem List   Diagnosis Date Noted   Acute renal failure superimposed on stage 4 chronic kidney disease, unspecified acute renal failure type ([place]) 12/25/2021   Schizoaffective disorder ([place]) 12/25/2021   Acute renal failure superimposed on stage 4 chronic kidney disease ([place]) 03/50/0938   Acute metabolic encephalopathy 18/29/9371   Hyperkalemia    Hypertensive urgency 08/29/2019   Acute on chronic renal failure ([place]) 08/29/2019   Hypoglycemia 08/28/2019   Normocytic anemia 11/03/2017   UGI bleed 06/24/2017   E coli bacteremia 06/17/2017   Altered mental status 06/15/2017   Pressure injury of skin 06/15/2017   AKI (acute kidney injury) ([place]) 06/15/2017   CKD (chronic kidney disease) stage 4, GFR 15-29 ml/min (HCC) 06/15/2017   History of stroke 06/15/2017   Acute lower UTI 06/15/2017   Dehydration 06/15/2017   Cocaine abuse ([place])    Noncompliance w/medication treatment due to intermit use of medication    Middle cerebral artery stenosis, right    Stenosis of right carotid artery    Cerebrovascular accident (CVA) ([place])    Malignant hypertension 05/24/2017   Smoker 06/12/2014   Memory loss or impairment 02/13/2013   Right shoulder pain 02/21/2011   HTN (hypertension) 01/06/2011    Hyperlipidemia with target LDL less than 100 01/06/2011   Polysubstance abuse ([place]) 01/06/2011   CVA (cerebral infarction) 01/06/2011   Preventative health care 01/06/2011    Orientation RESPIRATION BLADDER Height & Weight     Self  Normal Incontinent Weight: 108 lb 14.5 oz (49.4 kg) Height:  5\' 5"  (165.1 cm)  BEHAVIORAL SYMPTOMS/MOOD NEUROLOGICAL BOWEL NUTRITION STATUS      Incontinent Diet (renal, with fluid restriction 1828mL fluid)  AMBULATORY STATUS COMMUNICATION OF NEEDS Skin   Extensive Assist Verbally Normal                       Personal Care Assistance Level of Assistance  Bathing, Feeding, Dressing Bathing Assistance: Maximum assistance   Dressing Assistance: Maximum assistance     Functional Limitations Info  Sight, Hearing, Speech Sight Info: Adequate Hearing Info: Adequate      SPECIAL CARE FACTORS FREQUENCY                       Contractures Contractures Info: Not present    Additional Factors Info  Code Status, Allergies, Psychotropic Code Status Info: DNR Allergies Info: pork-derived products Psychotropic Info: risperdal, remeron, zoloft, depakane         Current Medications (12/26/2021):  This is the current hospital active medication list Current Facility-Administered Medications  Medication Dose Route Frequency Provider Last Rate Last Admin   0.45 % sodium chloride infusion   Intravenous Continuous Rosita Fire, MD 125 mL/hr at 12/26/21 1017 New Bag at 12/26/21 1017  acetaminophen (TYLENOL) tablet 650 mg  650 mg Oral Q6H PRN Barton Dubois, MD       Or   acetaminophen (TYLENOL) suppository 650 mg  650 mg Rectal Q6H PRN Barton Dubois, MD       atorvastatin (LIPITOR) tablet 40 mg  40 mg Oral q1800 Barton Dubois, MD   40 mg at 12/25/21 1838   calcium carbonate (TUMS - dosed in mg elemental calcium) chewable tablet 400 mg of elemental calcium  2 tablet Oral TID Barton Dubois, MD   400 mg of elemental calcium at 12/25/21 2144    carvedilol (COREG) tablet 6.25 mg  6.25 mg Oral BID WC Barton Dubois, MD   6.25 mg at 12/25/21 1837   cholecalciferol (VITAMIN D3) 25 MCG (1000 UNIT) tablet 2,000 Units  2,000 Units Oral Daily Barton Dubois, MD   2,000 Units at 12/25/21 1530   clopidogrel (PLAVIX) tablet 75 mg  75 mg Oral Daily Barton Dubois, MD   75 mg at 12/25/21 1530   famotidine (PEPCID) tablet 20 mg  20 mg Oral QHS Barton Dubois, MD   20 mg at 12/25/21 2144   mirtazapine (REMERON) tablet 7.5 mg  7.5 mg Oral QHS Barton Dubois, MD   7.5 mg at 12/25/21 2144   ondansetron (ZOFRAN) tablet 4 mg  4 mg Oral Q6H PRN Barton Dubois, MD       Or   ondansetron [facility]) injection 4 mg  4 mg Intravenous Q6H PRN Barton Dubois, MD       Oral care mouth rinse  15 mL Mouth Rinse PRN Barton Dubois, MD       pantoprazole (PROTONIX) EC tablet 40 mg  40 mg Oral Daily Barton Dubois, MD   40 mg at 12/25/21 1536   risperiDONE (RISPERDAL) tablet 0.25 mg  0.25 mg Oral BID Barton Dubois, MD   0.25 mg at 12/25/21 2144   sertraline (ZOLOFT) tablet 50 mg  50 mg Oral Daily Barton Dubois, MD   50 mg at 12/25/21 1530   sodium bicarbonate tablet 1,300 mg  1,300 mg Oral BID Rosita Fire, MD       [START ON 12/27/2021] sodium zirconium cyclosilicate (LOKELMA) packet 10 g  10 g Oral Daily Rosita Fire, MD       Valproate Sodium (DEPAKENE) solution 500 mg  500 mg Oral TID Barton Dubois, MD   500 mg at 12/25/21 2143     Discharge Medications: Please see discharge summary for a list of discharge medications.  Relevant Imaging Results:  Relevant Lab Results:   Additional Information PT SSN [id number]  Ihor Gully, LCSW

## 2021-12-27 DIAGNOSIS — G9341 Metabolic encephalopathy: Secondary | ICD-10-CM | POA: Diagnosis not present

## 2021-12-27 DIAGNOSIS — I5032 Chronic diastolic (congestive) heart failure: Secondary | ICD-10-CM | POA: Diagnosis not present

## 2021-12-27 DIAGNOSIS — I63312 Cerebral infarction due to thrombosis of left middle cerebral artery: Secondary | ICD-10-CM | POA: Diagnosis not present

## 2021-12-27 DIAGNOSIS — N179 Acute kidney failure, unspecified: Secondary | ICD-10-CM | POA: Diagnosis not present

## 2021-12-27 LAB — RENAL FUNCTION PANEL
Albumin: 2.6 g/dL — ABNORMAL LOW (ref 3.5–5.0)
Anion gap: 8 (ref 5–15)
BUN: 114 mg/dL — ABNORMAL HIGH (ref 8–23)
CO2: 18 mmol/L — ABNORMAL LOW (ref 22–32)
Calcium: 7.7 mg/dL — ABNORMAL LOW (ref 8.9–10.3)
Chloride: 120 mmol/L — ABNORMAL HIGH (ref 98–111)
Creatinine, Ser: 10.1 mg/dL — ABNORMAL HIGH (ref 0.61–1.24)
GFR, Estimated: 5 mL/min — ABNORMAL LOW (ref >60)
Glucose, Bld: 87 mg/dL (ref 70–99)
Phosphorus: 4.5 mg/dL (ref 2.5–4.6)
Potassium: 5.1 mmol/L (ref 3.5–5.1)
Sodium: 146 mmol/L — ABNORMAL HIGH (ref 135–145)

## 2021-12-27 LAB — BPAM RBC
Blood Product Expiration Date: 202310192359
Blood Product Expiration Date: 202310222359
ISSUE DATE / TIME: 202309142012
ISSUE DATE / TIME: 202309150004
Unit Type and Rh: 5100
Unit Type and Rh: 5100

## 2021-12-27 LAB — IRON AND TIBC
Iron: 42 ug/dL — ABNORMAL LOW (ref 45–182)
Saturation Ratios: 28 % (ref 17.9–39.5)
TIBC: 150 ug/dL — ABNORMAL LOW (ref 250–450)
UIBC: 108 ug/dL

## 2021-12-27 LAB — TYPE AND SCREEN
ABO/RH(D): O POS
Antibody Screen: NEGATIVE
Unit division: 0
Unit division: 0

## 2021-12-27 LAB — FERRITIN: Ferritin: 663 ng/mL — ABNORMAL HIGH (ref 24–336)

## 2021-12-27 LAB — GLUCOSE, CAPILLARY: Glucose-Capillary: 252 mg/dL — ABNORMAL HIGH (ref 70–99)

## 2021-12-27 NOTE — Progress Notes (Signed)
Progress Note   Patient: Lucas [name] KVQ:[id number] DOB: Aug 23, 1950 DOA: 12/25/2021     2 DOS: the patient was seen and examined on 12/27/2021   Brief hospital course: Lucas [name] is a 71 y.o. male with medical history significant of chronic kidney disease stage IV, diastolic heart failure, hyperlipidemia, hypertension, gastroesophageal reflux disease, schizoaffective disorder and prior history of stroke; who was brought in from his skilled nursing facility secondary to abnormal labs.  There is also report from the facility of patient expressing no feeling good, but unable to elaborate on his specific symptoms.  Blood work at outside hospital demonstrating elevated potassium, worsening renal function and a hemoglobin of 7.  Patient reports decreased oral intake and nausea.   Patient denies chest pain, shortness of breath, vomiting, fever, chills, dysuria or hematuria; there was no hematochezia, any new focal deficits or any other complaints.   In the ED work-up demonstrates hemoglobin of 6.4, white blood cells 8.9 and platelet counts 643 K; basic metabolic panel with sodium at 155, potassium 6.1, chloride 125, BUN 154 and creatinine 13.10.  Nephrology service was consulted with recommendation for renal ultrasound, osmolality, urinalysis, initiation of fluid resuscitation, Lokelma, blood transfusion and supportive care.  They will see patient in consultation.  TRH has been called to place patient in the hospital for further evaluation and management.  Assessment and Plan: * Acute renal failure superimposed on stage 4 chronic kidney disease, unspecified acute renal failure type (HCC) -In the setting of prerenal azotemia and dehydration versus further progression of underlying renal failure. -Patient with stage IV chronic kidney disease at baseline. -After long discussion with daughter and niece patient apparently was already involved in palliative care as an outpatient to be followed making at  nursing home.  Patient has expressed in the past no further dialysis or intervention.  This is something that has been ongoing for the last 8-33-month according to the niece. -After receiving this information and further discussing with family members decision was made to finalize stabilization for just 24 hours while arranging patient to go back to nursing home with palliative care following him up. -Appreciate assistance and recommendations by nephrology service -Renal ultrasound demonstrated no hydronephrosis. -Plan is for comfort care and palliative management at discharge. -Patient is DNR.  Chronic diastolic CHF (congestive heart failure) (HCC) -Stable and compensated -Continue holding diuretics at this point. -Continue fluid resuscitation -Follow strict I's and O's. -Family goal of care comfort management and palliative.  Hyperphosphatemia - PhosLo initiated by nephrology service. -Will continue as long as patient able to tolerate by mouth -Main plan is for comfort care.   Metabolic acidosis -In the setting of renal failure -Continue adjusted dose of sodium bicarbonate daily as long as he can take oral medications. -Overall plan is for comfort care..  Schizoaffective disorder ([place]) -Continue supportive care -Resume home mood stabilizing agents. -Plan is for comfort care and symptomatic management.  Acute metabolic encephalopathy -Possible from dehydration, uremia with worsening renal function, hypernatremia and decrease hemoglobin level causing hypoperfusion to the brain. -Status post 2 units PRBCs with good response in his hemoglobin. -Planning to provide 24 more hours of a stabilization and transferred back to nursing home with palliative care management. -No future hospitalization -Comfort care and symptomatic management only.  Hyperkalemia -In the setting of worsening renal failure -Improved/resolved with fluid resuscitation and the use of Lokelma.   Cerebrovascular  accident (CVA) ([place]) -No new focal deficits appreciated -Continue risk factor modification. -Continue the use of aspirin  for secondary prevention. -Ultimate goal of care is comfort and palliative management.  Hyperlipidemia with target LDL less than 100 - Continue statins as long as patient is able to take them by mouth -Ultimate goal of care is comfort.  HTN (hypertension) -Stable overall -Continue current antihypertensive agents, as long as patient is able to take them by mouth. -Plan is for him to be discharged back to skilled nursing facility with palliative care follow-up and comfort management.     Subjective:  No fevers, no nausea, no vomiting, no chest pain, no shortness of breath.  There has been some improvement in his urine output.  Physical Exam: Vitals:   12/26/21 1446 12/26/21 2008 12/27/21 0445 12/27/21 0539  BP: (!) 183/60 (!) 150/45  (!) 158/51  Pulse: 91 94  93  Resp: 16 20  19   Temp: 98.6 F (37 C) 99.3 F (37.4 C)  98.1 F (36.7 C)  TempSrc: Oral     SpO2:  100%  98%  Weight:   52.7 kg   Height:       General exam: Oriented x1; sleepy today.  Following very simple commands.  No overnight events. Respiratory system: Clear to auscultation. Respiratory effort normal.  Good saturation on room air. Cardiovascular system:RRR. No rubs or gallops. Gastrointestinal system: Abdomen is nondistended, soft and nontender. No organomegaly or masses felt. Normal bowel sounds heard. Central nervous system: Alert and oriented. No new focal neurological deficits. Extremities: No cyanosis or clubbing. Skin: No petechiae. Psychiatry: Judgement and insight appear impaired secondary to underlying dementia and schizoaffective disorder.  Data Reviewed: Renal function panel: Sodium 146, potassium 5.1, chloride 120, bicarb 18, BUN 114, creatinine 10.10, calcium 7.7, phosphorus 4.5, GFR 5 and anion gap 8 Ferritin 663 Iron 42; TIBC 150, with saturation rations of  28.   Family Communication: Daughter and niece at bedside.  Disposition: Status is: Inpatient Remains inpatient appropriate because: Still requiring IV therapy.  Planning for 24 hours of further stabilization and discharged back to long-term nursing home with palliative care follow-up.   Planned Discharge Destination: Skilled nursing facility long-term resident.   Author: Barton Dubois, MD 12/27/2021 1:42 PM  For on call review [email or web address].

## 2021-12-28 DIAGNOSIS — I63312 Cerebral infarction due to thrombosis of left middle cerebral artery: Secondary | ICD-10-CM | POA: Diagnosis not present

## 2021-12-28 DIAGNOSIS — I5032 Chronic diastolic (congestive) heart failure: Secondary | ICD-10-CM | POA: Diagnosis not present

## 2021-12-28 DIAGNOSIS — N179 Acute kidney failure, unspecified: Secondary | ICD-10-CM | POA: Diagnosis not present

## 2021-12-28 DIAGNOSIS — G9341 Metabolic encephalopathy: Secondary | ICD-10-CM | POA: Diagnosis not present

## 2021-12-28 MED ORDER — SERTRALINE HCL 50 MG PO TABS: 50.0000 mg | ORAL_TABLET | Freq: Every day | ORAL | Status: AC

## 2021-12-28 MED ORDER — SODIUM BICARBONATE 650 MG PO TABS: 1300.0000 mg | ORAL_TABLET | Freq: Two times a day (BID) | ORAL | Status: AC

## 2021-12-28 NOTE — Progress Notes (Signed)
EMS has been dispatched.  Ciaran Begay Tarpley-Carter, MSW, LCSW-A Pronouns:  She/Her/Hers [facility] Health[facility]itions of Care Clinical Social Worker Direct Number:  [phone] [email or web address]

## 2021-12-28 NOTE — Progress Notes (Signed)
TOC CSW attempted to contact pts sister, Kendarrius Tanzi [phone]. CSW received a message stating wireless customer wasn't accepting calls at this time to call back later.  CSW could not leave a message.  CSW will contact Hattie later.  Wei Newbrough Tarpley-Carter, MSW, LCSW-A Pronouns:  She/Her/Hers [facility] of Care Clinical Social Worker Direct Number:  [phone] [email or web address]

## 2021-12-28 NOTE — Discharge Summary (Signed)
Physician Discharge Summary   Patient: Lucas [name] MRN: [id number] DOB: 1950-11-29  Admit date:     12/25/2021  Discharge date: 12/28/21  Discharge Physician: Barton Dubois   PCP: System, Provider Not In   Recommendations at discharge:  Comfort care and symptomatic management. -Palliative care follow-up for medication adjustment and further treatment at a skilled nursing facility. -No future hospitalization.  Discharge Diagnoses: Principal Problem:   Acute renal failure superimposed on stage 4 chronic kidney disease, unspecified acute renal failure type ([place]) Active Problems:   HTN (hypertension)   Hyperlipidemia with target LDL less than 100   Cerebrovascular accident (CVA) ([place])   Hyperkalemia   Acute metabolic encephalopathy   Schizoaffective disorder (HCC)   Metabolic acidosis   Hyperphosphatemia   Chronic diastolic CHF (congestive heart failure) [facility])    Hospital Course: Lucas [name] is a 71 y.o. male with medical history significant of chronic kidney disease stage IV, diastolic heart failure, hyperlipidemia, hypertension, gastroesophageal reflux disease, schizoaffective disorder and prior history of stroke; who was brought in from his skilled nursing facility secondary to abnormal labs.  There is also report from the facility of patient expressing no feeling good, but unable to elaborate on his specific symptoms.  Blood work at outside hospital demonstrating elevated potassium, worsening renal function and a hemoglobin of 7.  Patient reports decreased oral intake and nausea.   Patient denies chest pain, shortness of breath, vomiting, fever, chills, dysuria or hematuria; there was no hematochezia, any new focal deficits or any other complaints.   In the ED work-up demonstrates hemoglobin of 6.4, white blood cells 8.9 and platelet counts 329 K; basic metabolic panel with sodium at 155, potassium 6.1, chloride 125, BUN 154 and creatinine 13.10.  Nephrology service was  consulted with recommendation for renal ultrasound, osmolality, urinalysis, initiation of fluid resuscitation, Lokelma, blood transfusion and supportive care.  They will see patient in consultation.  TR[facility] has been called to place patient in the hospital for further evaluation and management.  Assessment and Plan: * Acute renal failure superimposed on stage 4 chronic kidney disease, unspecified acute renal failure type (HCC) -In the setting of prerenal azotemia and dehydration versus further progression of underlying renal failure. -Patient with stage IV chronic kidney disease at baseline. -After long discussion with daughter and niece patient apparently was already involved in palliative care as an outpatient to be followed at nursing home.  Patient has expressed/decided in the past no dialysis or further invasive intervention.  This is something that has been ongoing for the last 8-35-month according to the patient's niece. -After receiving this information and further discussing with family members at bedside, decision was made to focus on symptomatic management and to transfer patient back to SNF with palliative. -no future labwork, no hospitalizations or any other invasive interventions. -comfort care only. -Renal ultrasound demonstrated no hydronephrosis. -Patient is DNR.  Chronic diastolic CHF (congestive heart failure) (HCC) -Stable and compensated -Continue holding diuretics at this point. -Continue fluid resuscitation -Follow strict I's and O's. -Family goal of care comfort management and palliative.  Hyperphosphatemia - PhosLo initiated by nephrology service. -Will continue as long as patient able to tolerate by mouth -Main plan is for comfort care.   Metabolic acidosis -In the setting of renal failure -Continue adjusted dose of sodium bicarbonate daily as long as he can take oral medications. -Overall plan is for comfort care..  Schizoaffective disorder ([place]) -Continue  supportive care -Resume home mood stabilizing agents. -Plan is for comfort care  and symptomatic management.  Acute metabolic encephalopathy -Possible from dehydration, uremia with worsening renal function, hypernatremia and decrease hemoglobin level causing hypoperfusion to the brain. -Status post 2 units PRBCs with good response in his hemoglobin. -patient will be transfer back to nursing home with palliative care management. -No future hospitalization -Comfort care and symptomatic management only.  Hyperkalemia -In the setting of worsening renal failure -Improved/resolved with fluid resuscitation and the use of Lokelma.   Cerebrovascular accident (CVA) ([place]) -No new focal deficits appreciated -Continue risk factor modification. -Continue the use of aspirin for secondary prevention. -Ultimate goal of care is comfort and palliative management.  Hyperlipidemia with target LDL less than 100 - Continue statins as long as patient is able to take them by mouth -Ultimate goal of care is comfort.  HTN (hypertension) -Stable overall -Continue current antihypertensive agents, as long as patient is able to take them by mouth. -Plan is for him to be discharged back to skilled nursing facility with palliative care follow-up and comfort management.    Consultants: Nephrology service Procedures performed: See below for x-ray reports. Disposition:  Skilled nursing facility with palliative care. Diet recommendation: Heart healthy/renal diet.  DISCHARGE MEDICATION: Allergies as of 12/28/2021       Reactions   Pork-derived Products Other (See Comments)   Patient does not eat pork products.        Medication List     STOP taking these medications    amLODipine 10 MG tablet Commonly known as: NORVASC   ferrous sulfate 325 (65 FE) MG EC tablet   hydrALAZINE 100 MG tablet Commonly known as: APRESOLINE       TAKE these medications    acetaminophen 325 MG tablet Commonly  known as: TYLENOL Take 650 mg by mouth in the morning, at noon, and at bedtime.   ascorbic acid 500 MG tablet Commonly known as: VITAMIN C Take 1 tablet (500 mg total) by mouth daily.   atorvastatin 40 MG tablet Commonly known as: LIPITOR Take 1 tablet (40 mg total) by mouth daily at 6 PM.   calcium carbonate 500 MG chewable tablet Commonly known as: Tums Chew 2 tablets (400 mg of elemental calcium total) by mouth 3 (three) times daily.   carvedilol 6.25 MG tablet Commonly known as: COREG Take 1 tablet (6.25 mg total) by mouth 2 (two) times daily with a meal.   Cholecalciferol 50 MCG (2000 UT) Tabs Take 1 tablet by mouth daily.   clopidogrel 75 MG tablet Commonly known as: PLAVIX Take 1 tablet (75 mg total) by mouth daily.   famotidine 20 MG tablet Commonly known as: PEPCID Take 1 tablet (20 mg total) by mouth 2 (two) times daily.   Minerin Creme Crea Apply 1 application  topically every 12 (twelve) hours as needed (skin hydration).   mirtazapine 15 MG tablet Commonly known as: REMERON Take 0.5 tablets by mouth at bedtime.   risperiDONE 0.25 MG tablet Commonly known as: RISPERDAL Take 0.25 mg by mouth 2 (two) times daily.   sertraline 50 MG tablet Commonly known as: ZOLOFT Take 1 tablet (50 mg total) by mouth daily. Start taking on: December 29, 2021 What changed:  medication strength how much to take   sodium bicarbonate 650 MG tablet Take 2 tablets (1,300 mg total) by mouth 2 (two) times daily. What changed:  medication strength how much to take   Valproate Sodium 250 MG/5ML Soln solution Commonly known as: DEPAKENE Take 10 mLs by mouth 3 (three) times daily.  Veltassa 16.8 g Pack Generic drug: Patiromer Sorbitex Calcium Take 1 packet by mouth daily.        Contact information for after-discharge care     Destination     [facility] .   Service: Skilled Nursing Contact information: [address]  [place] [phone]                    Discharge Exam: Danley Danker Weights   12/25/21 1153 12/26/21 0500 12/27/21 0445  Weight: 47.6 kg 49.4 kg 52.7 kg   General exam: Oriented x1; hemodynamically stable and in no acute distress. Respiratory system: Clear to auscultation. Respiratory effort normal.  Good saturation on room air. Cardiovascular system:RRR. No rubs or gallops. Gastrointestinal system: Abdomen is nondistended, soft and nontender. No organomegaly or masses felt. Normal bowel sounds heard. Central nervous system: Alert and oriented. No new focal neurological deficits. Extremities: No cyanosis or clubbing. Skin: No petechiae. Psychiatry: Judgement and insight appear impaired secondary to underlying dementia and schizoaffective disorder.    Condition at discharge: stable  The results of significant diagnostics from this hospitalization (including imaging, microbiology, ancillary and laboratory) are listed below for reference.   Imaging Studies: US Renal  Result Date: 12/25/2021 CLINICAL DATA:  Renal failure EXAM: RENAL / URINARY TRACT ULTRASOUND COMPLETE COMPARISON:  09/02/2020 FINDINGS: Right Kidney: Renal measurements: 8.9 x 5.1 x 4.4 cm = volume: 105 mL. No hydronephrosis. Diffuse increased echogenicity of the renal cortex without significant thinning. Left Kidney: Renal measurements: 7.2 x 4.7 x 4.1 cm = volume: 73 mL. No hydronephrosis. Diffuse increased echogenicity of the renal cortex without significant thinning. 1.7 cm hypoechoic structure in the upper pole of the left kidney has an irregular wall with questionable internal echogenicities. Bladder: Mild diffuse bladder wall thickening. Debris noted within the dependent portion of the bladder. Other: None. IMPRESSION: 1. No hydronephrosis. 2. Increased echogenicity of the renal cortex consistent with medical renal disease. 3. 1.7 cm hypoechoic structure in the upper pole the left kidney cannot be characterized as a  simple cyst on the current exam given its irregular borders and potential internal echogenicities. Further evaluation with renal mass protocol CT or MRI should be performed on nonemergent basis, when patient condition allows. 4. Mild diffuse bladder wall thickening which may be due to chronic cystitis correlate obstruction. Debris noted within the bladder lumen may be due to UTI. Electronically Signed   By: Miachel Roux M.D.   On: 12/25/2021 14:48    Microbiology: Results for orders placed or performed during the hospital encounter of 02/07/21  Resp Panel by RT-PCR (Flu A&B, Covid) Nasopharyngeal Swab     Status: None   Collection Time: 02/07/21  4:23 PM   Specimen: Nasopharyngeal Swab; Nasopharyngeal(NP) swabs in vial transport medium  Result Value Ref Range Status   SARS Coronavirus 2 by RT PCR NEGATIVE NEGATIVE Final    Comment: (NOTE) SARS-CoV-2 target nucleic acids are NOT DETECTED.  The SARS-CoV-2 RNA is generally detectable in upper respiratory specimens during the acute phase of infection. The lowest concentration of SARS-CoV-2 viral copies this assay can detect is 138 copies/mL. A negative result does not preclude SARS-Cov-2 infection and should not be used as the sole basis for treatment or other patient management decisions. A negative result may occur with  improper specimen collection/handling, submission of specimen other than nasopharyngeal swab, presence of viral mutation(s) within the areas targeted by this assay, and inadequate number of viral copies(<138 copies/mL). A negative result  must be combined with clinical observations, patient history, and epidemiological information. The expected result is Negative.  Fact Sheet for Patients:  EntrepreneurPulse.com.au  Fact Sheet for Healthcare Providers:  IncredibleEmployment.be  This test is no t yet approved or cleared by the [place] FDA and  has been authorized for detection  and/or diagnosis of SARS-CoV-2 by FDA under an Emergency Use Authorization (EUA). This EUA will remain  in effect (meaning this test can be used) for the duration of the COVID-19 declaration under Section 564(b)(1) of the Act, 21 U.S.C.section 360bbb-3(b)(1), unless the authorization is terminated  or revoked sooner.       Influenza A by PCR NEGATIVE NEGATIVE Final   Influenza B by PCR NEGATIVE NEGATIVE Final    Comment: (NOTE) The Xpert Xpress SARS-CoV-2/FLU/RSV plus assay is intended as an aid in the diagnosis of influenza from Nasopharyngeal swab specimens and should not be used as a sole basis for treatment. Nasal washings and aspirates are unacceptable for Xpert Xpress SARS-CoV-2/FLU/RSV testing.  Fact Sheet for Patients: EntrepreneurPulse.com.au  Fact Sheet for Healthcare Providers: IncredibleEmployment.be  This test is not yet approved or cleared by the [place] FDA and has been authorized for detection and/or diagnosis of SARS-CoV-2 by FDA under an Emergency Use Authorization (EUA). This EUA will remain in effect (meaning this test can be used) for the duration of the COVID-19 declaration under Section 564(b)(1) of the Act, 21 U.S.C. section 360bbb-3(b)(1), unless the authorization is terminated or revoked.  Performed at [facility], [address] [place], [place] [address]     Labs: CBC: Recent Labs  Lab 12/25/21 1222 12/26/21 0614  WBC 8.9 7.4  NEUTROABS 6.3  --   HGB 6.4* 9.9*  HCT 21.7* 31.0*  MCV 90.0 88.1  PLT 122* 169*   Basic Metabolic Panel: Recent Labs  Lab 12/25/21 1222 12/25/21 1517 12/26/21 0614 12/27/21 0601  NA 155*  --  152* 146*  K 6.1*  --  5.9* 5.1  CL 125*  --  124* 120*  CO2 22  --  18* 18*  GLUCOSE 114*  --  82 87  BUN 154*  --  126* 114*  CREATININE 13.10*  --  11.51* 10.10*  CALCIUM 8.9  --  8.5* 7.7*  MG  --  3.5*  --   --   PHOS  --  6.1* 6.0* 4.5   Liver Function  Tests: Recent Labs  Lab 12/26/21 0614 12/27/21 0601  ALBUMIN 3.1* 2.6*   CBG: Recent Labs  Lab 12/27/21 1606  GLUCAP 252*    Discharge time spent: greater than 30 minutes.  Signed: Barton Dubois, MD [facility] 12/28/2021

## 2021-12-28 NOTE — Progress Notes (Signed)
TOC CSW spoke with Barbara Cower Social Worker for the following return information:  Room#:  [address] [employer] Report #:  [phone]  This information will also be shared with the nurse.  Lucas [name], MSW, LCSW-A Pronouns:  She/Her/Hers [facility] Care Clinical Social Worker Direct Number:  [phone] [name]

## 2021-12-28 NOTE — Progress Notes (Signed)
TOC CSW currently awaiting return information from Fraser Din, [place] [employer] officer, museum.  Taji Barretto Tarpley-Carter, MSW, LCSW-A Pronouns:  She/Her/Hers [facility] Clinical Social Worker Direct Number:  [phone] [email or web address]

## 2022-01-07 LAB — PARATHYROID HORMONE, INTACT (NO CA)

## 2022-03-13 DEATH — deceased
